# Patient Record
Sex: Female | Born: 1975
Health system: Southern US, Community
[De-identification: ages and names within clinical notes are randomized; demographics above are authoritative.]

## PROBLEM LIST (undated history)

## (undated) DIAGNOSIS — Z992 Dependence on renal dialysis: Secondary | ICD-10-CM

## (undated) DIAGNOSIS — N289 Disorder of kidney and ureter, unspecified: Secondary | ICD-10-CM

## (undated) DIAGNOSIS — Z9119 Patient's noncompliance with other medical treatment and regimen: Secondary | ICD-10-CM

## (undated) DIAGNOSIS — B20 Human immunodeficiency virus [HIV] disease: Secondary | ICD-10-CM

## (undated) DIAGNOSIS — Z22322 Carrier or suspected carrier of Methicillin resistant Staphylococcus aureus: Secondary | ICD-10-CM

## (undated) DIAGNOSIS — B009 Herpesviral infection, unspecified: Secondary | ICD-10-CM

## (undated) DIAGNOSIS — Z21 Asymptomatic human immunodeficiency virus [HIV] infection status: Secondary | ICD-10-CM

## (undated) DIAGNOSIS — J850 Gangrene and necrosis of lung: Secondary | ICD-10-CM

## (undated) DIAGNOSIS — J189 Pneumonia, unspecified organism: Secondary | ICD-10-CM

## (undated) DIAGNOSIS — Z91199 Patient's noncompliance with other medical treatment and regimen due to unspecified reason: Secondary | ICD-10-CM

## (undated) DIAGNOSIS — N186 End stage renal disease: Secondary | ICD-10-CM

---

## 1997-06-24 ENCOUNTER — Other Ambulatory Visit: Admission: RE | Admit: 1997-06-24 | Discharge: 1997-06-24 | Payer: Self-pay | Admitting: Obstetrics

## 2010-03-31 HISTORY — PX: BREAST SURGERY: SHX581

## 2010-04-01 ENCOUNTER — Inpatient Hospital Stay (HOSPITAL_COMMUNITY)
Admission: EM | Admit: 2010-04-01 | Discharge: 2010-04-08 | DRG: 974 | Disposition: A | Discharge status: Home Health | Payer: Medicaid Other | Attending: Internal Medicine | Admitting: Internal Medicine

## 2010-04-01 ENCOUNTER — Inpatient Hospital Stay (INDEPENDENT_AMBULATORY_CARE_PROVIDER_SITE_OTHER)
Admission: RE | Admit: 2010-04-01 | Discharge: 2010-04-01 | Disposition: A | Discharge status: Home / Self Care | Payer: Self-pay | Source: Ambulatory Visit | Attending: Family Medicine | Admitting: Family Medicine

## 2010-04-01 ENCOUNTER — Encounter (INDEPENDENT_AMBULATORY_CARE_PROVIDER_SITE_OTHER): Payer: Self-pay | Admitting: *Deleted

## 2010-04-01 DIAGNOSIS — E43 Unspecified severe protein-calorie malnutrition: Secondary | ICD-10-CM | POA: Diagnosis present

## 2010-04-01 DIAGNOSIS — L03818 Cellulitis of other sites: Secondary | ICD-10-CM | POA: Insufficient documentation

## 2010-04-01 DIAGNOSIS — A4902 Methicillin resistant Staphylococcus aureus infection, unspecified site: Secondary | ICD-10-CM | POA: Diagnosis present

## 2010-04-01 DIAGNOSIS — T8140XA Infection following a procedure, unspecified, initial encounter: Secondary | ICD-10-CM | POA: Diagnosis present

## 2010-04-01 DIAGNOSIS — J189 Pneumonia, unspecified organism: Secondary | ICD-10-CM | POA: Diagnosis present

## 2010-04-01 DIAGNOSIS — N61 Mastitis without abscess: Secondary | ICD-10-CM

## 2010-04-01 DIAGNOSIS — B20 Human immunodeficiency virus [HIV] disease: Principal | ICD-10-CM | POA: Diagnosis present

## 2010-04-01 DIAGNOSIS — A310 Pulmonary mycobacterial infection: Secondary | ICD-10-CM | POA: Diagnosis present

## 2010-04-01 DIAGNOSIS — Y838 Other surgical procedures as the cause of abnormal reaction of the patient, or of later complication, without mention of misadventure at the time of the procedure: Secondary | ICD-10-CM | POA: Diagnosis present

## 2010-04-01 DIAGNOSIS — F3289 Other specified depressive episodes: Secondary | ICD-10-CM | POA: Diagnosis present

## 2010-04-01 DIAGNOSIS — F329 Major depressive disorder, single episode, unspecified: Secondary | ICD-10-CM | POA: Diagnosis present

## 2010-04-01 DIAGNOSIS — R05 Cough: Secondary | ICD-10-CM

## 2010-04-01 DIAGNOSIS — E876 Hypokalemia: Secondary | ICD-10-CM | POA: Diagnosis not present

## 2010-04-01 DIAGNOSIS — L02818 Cutaneous abscess of other sites: Secondary | ICD-10-CM

## 2010-04-01 LAB — COMPREHENSIVE METABOLIC PANEL
Alkaline Phosphatase: 124 U/L — ABNORMAL HIGH (ref 39–117)
BUN: 5 mg/dL — ABNORMAL LOW (ref 6–23)
Calcium: 8.4 mg/dL (ref 8.4–10.5)
GFR calc non Af Amer: >60 mL/min (ref >60)
Glucose, Bld: 91 mg/dL (ref 70–99)
Total Protein: 8.6 g/dL — ABNORMAL HIGH (ref 6.0–8.3)

## 2010-04-01 LAB — DIFFERENTIAL
Eosinophils Relative: 5 % (ref 0–5)
Lymphocytes Relative: 12 % (ref 12–46)
Lymphs Abs: 1.3 10*3/uL (ref 0.7–4.0)
Monocytes Absolute: 1.1 10*3/uL — ABNORMAL HIGH (ref 0.1–1.0)
Monocytes Relative: 10 % (ref 3–12)

## 2010-04-01 LAB — CONVERTED CEMR LAB
CD4 T Helper %: 2 %
Creatinine, Ser: 0.57 mg/dL
Platelets: 417 10*3/uL

## 2010-04-01 LAB — CBC
HCT: 26.4 % — ABNORMAL LOW (ref 36.0–46.0)
MCHC: 31.1 g/dL (ref 30.0–36.0)
MCV: 73.5 fL — ABNORMAL LOW (ref 78.0–100.0)
RDW: 21 % — ABNORMAL HIGH (ref 11.5–15.5)

## 2010-04-02 ENCOUNTER — Inpatient Hospital Stay (HOSPITAL_COMMUNITY): Payer: Medicaid Other

## 2010-04-02 LAB — CBC
MCV: 74.5 fL — ABNORMAL LOW (ref 78.0–100.0)
Platelets: 417 10*3/uL — ABNORMAL HIGH (ref 150–400)
RBC: 3.06 MIL/uL — ABNORMAL LOW (ref 3.87–5.11)
WBC: 11.1 10*3/uL — ABNORMAL HIGH (ref 4.0–10.5)

## 2010-04-02 LAB — BASIC METABOLIC PANEL
BUN: 5 mg/dL — ABNORMAL LOW (ref 6–23)
Chloride: 105 mEq/L (ref 96–112)
GFR calc Af Amer: >60 mL/min (ref >60)
GFR calc non Af Amer: >60 mL/min (ref >60)
Potassium: 3.5 mEq/L (ref 3.5–5.1)
Sodium: 140 mEq/L (ref 135–145)

## 2010-04-02 LAB — DIFFERENTIAL
Basophils Absolute: 0 10*3/uL (ref 0.0–0.1)
Eosinophils Relative: 4 % (ref 0–5)
Monocytes Absolute: 1 10*3/uL (ref 0.1–1.0)
Neutrophils Relative %: 77 % (ref 43–77)

## 2010-04-02 MED ORDER — IOHEXOL 300 MG/ML  SOLN: 75.0000 mL | Freq: Once | INTRAMUSCULAR | Status: AC | PRN

## 2010-04-03 DIAGNOSIS — B59 Pneumocystosis: Secondary | ICD-10-CM

## 2010-04-03 DIAGNOSIS — N61 Mastitis without abscess: Secondary | ICD-10-CM

## 2010-04-04 ENCOUNTER — Other Ambulatory Visit: Payer: Self-pay | Admitting: Surgery

## 2010-04-04 DIAGNOSIS — N61 Mastitis without abscess: Secondary | ICD-10-CM

## 2010-04-04 DIAGNOSIS — B59 Pneumocystosis: Secondary | ICD-10-CM

## 2010-04-04 DIAGNOSIS — N611 Abscess of the breast and nipple: Secondary | ICD-10-CM | POA: Insufficient documentation

## 2010-04-04 LAB — WOUND CULTURE: Gram Stain: NONE SEEN

## 2010-04-05 ENCOUNTER — Inpatient Hospital Stay (HOSPITAL_COMMUNITY): Payer: Medicaid Other

## 2010-04-05 DIAGNOSIS — B59 Pneumocystosis: Secondary | ICD-10-CM

## 2010-04-05 DIAGNOSIS — N61 Mastitis without abscess: Secondary | ICD-10-CM

## 2010-04-05 LAB — BASIC METABOLIC PANEL
BUN: 2 mg/dL — ABNORMAL LOW (ref 6–23)
Chloride: 99 mEq/L (ref 96–112)
GFR calc non Af Amer: >60 mL/min (ref >60)
Glucose, Bld: 117 mg/dL — ABNORMAL HIGH (ref 70–99)
Potassium: 3.2 mEq/L — ABNORMAL LOW (ref 3.5–5.1)
Sodium: 139 mEq/L (ref 135–145)

## 2010-04-05 LAB — EXPECTORATED SPUTUM ASSESSMENT W GRAM STAIN, RFLX TO RESP C

## 2010-04-06 LAB — CBC
HCT: 23.3 % — ABNORMAL LOW (ref 36.0–46.0)
MCHC: 30.5 g/dL (ref 30.0–36.0)
RDW: 21.1 % — ABNORMAL HIGH (ref 11.5–15.5)
WBC: 6.2 10*3/uL (ref 4.0–10.5)

## 2010-04-06 LAB — BASIC METABOLIC PANEL
Calcium: 7.7 mg/dL — ABNORMAL LOW (ref 8.4–10.5)
GFR calc Af Amer: >60 mL/min (ref >60)
GFR calc non Af Amer: >60 mL/min (ref >60)
Glucose, Bld: 118 mg/dL — ABNORMAL HIGH (ref 70–99)
Potassium: 3.1 mEq/L — ABNORMAL LOW (ref 3.5–5.1)
Sodium: 139 mEq/L (ref 135–145)

## 2010-04-07 DIAGNOSIS — I339 Acute and subacute endocarditis, unspecified: Secondary | ICD-10-CM

## 2010-04-07 LAB — CULTURE, ROUTINE-ABSCESS

## 2010-04-07 NOTE — H&P (Signed)
[name], Becker NO.:  [id number]  MEDICAL RECORD NO.:  [id number]           PATIENT TYPE:  I  LOCATION:  5148                         FACILITY:  [facility]  PHYSICIAN:  Cherylynn Ridges, M.D.    DATE OF BIRTH:  17-Dec-1975  DATE OF ADMISSION:  04/01/2010 DATE OF DISCHARGE:  04/01/2010                             HISTORY & PHYSICAL   IDENTIFICATION CHIEF COMPLAINT:  The patient is a 35 year old with an infected right breast biopsy site.  HISTORY OF PRESENT ILLNESS:  The patient is HIV positive and contracted disease from her son's father probably more than 13 years ago.  She by her report had a right breast biopsy performed several days or weeks ago.  This was done in [place].  She subsequently moved to [facility] to be around her father and other family members and 2 days ago noticed some drainage from her incision site on the right side.  It was not packed with anything.  She came into the emergency room with a painful draining right breast incision.  Past medical history is significant for HIV positive.  She is not on any antiretroviral medications.  The patient had a C-section 13 years ago.  FAMILY HISTORY:  Mom died in Jun 21, 2000 of some type of cardiac condition leading to what she says a hole in her heart.  She is allergic to VANCOMYCIN noted in the ER when she developed a rash after a dose vancomycin for the current process.  She is not on any medications.  REVIEW OF SYSTEMS:  She has had some fevers at home up to about 101.  No chills.  PHYSICAL EXAMINATION:  GENERAL:  Here, she is 98.8, pulse of 96, blood pressure 105/64, respirations 20. GENERAL:  She is cachectic.  She has got marks around her skin from multiple recurrent infections.  She has not been treated for HIV for more than a year. HEENT:  Mucous membranes moist and pink. LUNGS:  Clear, although she has a chronic cough which she has had for several months.  Chest x-ray is pending. BREAST:   She has got a very tender, firm right breast with an inferolateral incision draining mucopurulent debris.  The nipple is inverted also with drainage of pus from the nipple.  I was able to open this cavity with Q-tips and subsequently packed with about 2 feet of quater-inch iodoform gauze. ABDOMEN:  Soft and nontender.  Rest of her exam is unremarkable.  LABORATORY STUDIES:  She has a white count of 10.7, hemoglobin of 8.2, platelets 453,000.  Electrolytes were within normal limits.  She is due to get a chest x-ray and also CT scan of her chest to look at her breast abscess and also her pulmonary pathology.  I will admit her after I packed in the ED.  I will start her on Unasyn since she appears to have a reaction to vancomycin.  She will be admitted to Surgical Service but Medicine will continue to follow her because of her HIV status, possibility of other medical problems, possibility of pneumonia and anemia.     Cherylynn Ridges, M.D.  JOW/MEDQ  D:  04/01/2010  T:  04/02/2010  Job:  [id number]  Electronically Signed by Jimmye Norman M.D. on 04/06/2010 02:32:41 PM

## 2010-04-08 ENCOUNTER — Encounter (INDEPENDENT_AMBULATORY_CARE_PROVIDER_SITE_OTHER): Payer: Self-pay | Admitting: *Deleted

## 2010-04-08 DIAGNOSIS — B20 Human immunodeficiency virus [HIV] disease: Secondary | ICD-10-CM | POA: Insufficient documentation

## 2010-04-08 LAB — CULTURE, BLOOD (ROUTINE X 2)
Culture  Setup Time: 201203030217
Culture: NO GROWTH

## 2010-04-08 LAB — CULTURE, RESPIRATORY W GRAM STAIN

## 2010-04-08 NOTE — Op Note (Signed)
NAMEGEORGA, Paula Becker NO.:  [id number]  MEDICAL RECORD NO.:  [id number]           PATIENT TYPE:  LOCATION:                                 FACILITY:  PHYSICIAN:  Currie Paris, M.D.DATE OF BIRTH:  22-Oct-1975  DATE OF PROCEDURE:  04/04/2010 DATE OF DISCHARGE:                              OPERATIVE REPORT   PREOPERATIVE DIAGNOSIS:  Complex breast abscess.  POSTOPERATIVE DIAGNOSIS:  Complex breast abscess.  Multiloculated complex breast abscess.  OPERATION:  Excision and debridement of complex breast abscess with biopsy of breast abscess wall and breast.  SURGEON:  Currie Paris, MD  ANESTHESIA:  General endotracheal.  CLINICAL HISTORY:  This is a 35 year old lady who is HIV positive and who presented with a breast abscess which was partially drained under local anesthesia in the emergency room by Dr. Jimmye Norman about 2 days ago.  Another area opened up for spontaneous drainage.  It looks like an old I and D site at the 6 o'clock position of her areolar margin.  Dr. Dixon Boos opening was at about the 8 o'clock position.  We did not think we could get this adequately debrided.  It drained as she had much induration and edema and exquisitely tender, so I thought this would be best done under anesthesia in the operating room.  DESCRIPTION OF PROCEDURE:  The patient was seen in the holding area and she had no further questions.  I initialed the right breast as the operative site.  The patient was taken to the operating room, and after satisfactory general endotracheal anesthesia had been obtained, the breast was prepped and draped and the time-out was done.  I made a curvilinear incision at the areolar margin connecting the open draining site at the 6 o'clock position and the prior I and D site at the 8 o'clock position, and there was a fairly complex abscess cavity. Further exploration showed that the cavity went more medially, so I opened the  skin further medially to be sure that was all unroofed and part of this very superficial just under the nipple-areolar complex. Other loculations went directly posteriorly, some laterally, some medially and inferiorly.  I spent several minutes opening up of these digitally.  I also took two large pieces of breast tissue that looked like chronic inflammatory tissues, some of which looked like dilated ducts to send for pathology just to be sure this was not an occult cancer and to see if there was any occult other organisms noted on stain of the tissue.  Once I had everything debrided out and I saw no more frank pus, I went ahead and irrigated for several minutes.  There were couple of smaller cavities that were fairly deep.  I put some Surgicel in to make sure there were not going to well up with any blood, although they appeared dry at this point and then packed the wound with 4 x 4's that were moist.  Sterile dressings were applied on the outside.  My plan will be to bring her to the operating room in about 48 hours for a dressing change under anesthesia.  Currie Paris, M.D.     CJS/MEDQ  D:  04/04/2010  T:  04/05/2010  Job:  [id number]  Electronically Signed by Cyndia Bent M.D. on 04/08/2010 05:54:49 AM

## 2010-04-08 NOTE — Op Note (Signed)
  NAMETIFFINIE, CAILLIER NO.:  [id number]  MEDICAL RECORD NO.:  [id number]           PATIENT TYPE:  I  LOCATION:  5114                         FACILITY:  [facility]  PHYSICIAN:  Currie Paris, M.D.DATE OF BIRTH:  1975-02-13  DATE OF PROCEDURE:  04/06/2010 DATE OF DISCHARGE:                              OPERATIVE REPORT   PREOPERATIVE DIAGNOSIS:  Large breast abscess, status post drainage and debridement.  POSTOPERATIVE DIAGNOSIS:  Large breast abscess, status post drainage and debridement.  PROCEDURE:  Dressing change under anesthesia.  CLINICAL HISTORY:  This patient underwent a drainage of an extensive complex multiloculated breast abscess 2 days ago.  She was brought back as scheduled for a dressing change under anesthesia today.  DESCRIPTION OF PROCEDURE:  I saw the patient in the holding area and marked the right breast as the operative site.  She had no further questions.  The patient was taken to the operating room and after satisfactory general (LMA) anesthesia had been obtained, the breast was prepped and draped.  The prior packing was removed.  I saw tiny bit of pus draining from one spot on the right lateral posterior aspect of the large cavity and put a hemostat in that.  I found a very tiny perhaps less 1 cm pocket.  All the rest of pockets that we had drained.  All well drained and actually started to have a clean granulation tissue on them.  There was no other collections of pus and no evidence of any necrotic tissue.  The area was irrigated thoroughly and repacked with sterile gauze saline packs.  The patient tolerated the procedure well with no complications.  Counts were correct.  The plan will be to reevaluate her tomorrow, and if we could do dressing changes in the room under IV sedation or premedication, we will do so, otherwise, bring her back to the operating room on Friday for dressing change under  anesthesia.     Currie Paris, M.D.     CJS/MEDQ  D:  04/06/2010  T:  04/07/2010  Job:  [id number]  Electronically Signed by Cyndia Bent M.D. on 04/08/2010 05:54:28 AM

## 2010-04-09 LAB — ANAEROBIC CULTURE: Gram Stain: NONE SEEN

## 2010-04-12 ENCOUNTER — Ambulatory Visit: Payer: Self-pay | Admitting: Adult Health

## 2010-04-12 LAB — CULTURE, BLOOD (ROUTINE X 2)

## 2010-04-12 NOTE — Miscellaneous (Signed)
Summary: RW updated  Clinical Lists Changes  Observations: Added new observation of INFECTDIS MD: [place] (04/08/2010 10:42) Added new observation of DATE1STVISIT: 04/12/2010 (04/08/2010 10:42) Added new observation of HIV INIT DX: 2002  (04/08/2010 10:42) Added new observation of GENDER: Female  (04/08/2010 10:42) Added new observation of MARITAL STAT: Single  (04/08/2010 10:42) Added new observation of [place]: [place]  (04/08/2010 10:42) Added new observation of HIV COMMENT: originally diagnosed in 2002  (04/08/2010 10:42) Added new observation of YEARAIDSPOS: 2012  (04/08/2010 10:42) Added new observation of HIV STATUS: CDC-defined AIDS  (04/08/2010 10:42)

## 2010-04-12 NOTE — Miscellaneous (Signed)
Summary: Problems, allergies and lab values updated  Clinical Lists Changes  Problems: Added new problem of AIDS (ICD-042) Added new problem of CELLULITIS AND ABSCESS OF OTHER SPECIFIED SITE (ICD-682.8) - right breast Allergies: Added new allergy or adverse reaction of VANCOMYCIN Observations: Added new observation of NKA: F (04/08/2010 10:37) Added new observation of CREATININE: 0.57 mg/dL (42/59/5638 75:64) Added new observation of PLATELETK/UL: 417 K/uL (04/01/2010 10:41) Added new observation of WBC COUNT: 11.1 10*3/microliter (04/01/2010 10:41) Added new observation of HGB: 7.1 g/dL (33/29/5188 41:66) Added new observation of GLUCOSE SER: 118 mg/dL (07/30/1599 09:32) Added new observation of T-HELPER %: 2 % (04/01/2010 10:41) Added new observation of CD4 COUNT: 30 microliters (04/01/2010 10:41)      -  Date:  04/01/2010    CD4: 30    CD4%: 2    Glucose: 118    Hemoglobin: 7.1    WBC: 11.1    Platelets: 417    Creatinine: 0.57

## 2010-04-13 ENCOUNTER — Telehealth: Payer: Self-pay | Admitting: *Deleted

## 2010-04-13 NOTE — Consult Note (Signed)
NAMEKAITELYN, Paula Becker NO.:  [id number]  MEDICAL RECORD NO.:  [id number]           PATIENT TYPE:  E  LOCATION:  [facility]                         FACILITY:  [facility]  PHYSICIAN:  Thad Ranger, MD       DATE OF BIRTH:  10/29/1975  DATE OF CONSULTATION:  04/01/2010 DATE OF DISCHARGE:                                CONSULTATION   REQUESTING PHYSICIAN:  [facility] ER MD  HISTORY OF PRESENT ILLNESS:  Briefly, Paula Becker is a 35 year old female with history of HIV, but not on any antiretroviral therapy and no other medical problems, presented to the [facility] emergency room for the draining large right breast abscess.  The rest of history was obtained from the patient who stated that she relocated to [place], [place] from [place] last week.  She started having draining from the right breast with accumulating pus and abscess and cellulitis in the last 2 months.  The patient was admitted on March 04, 2010, in [facility] [place] [facility], underwent incision and drainage surgery and remained on IV antibiotics, she was discharged on March 10, 2010.  The patient stated that she was not on any antibiotics on the discharge, the Y question compliance.  Here in [place] in the last week the patient started accumulating pus again in the right breast with subjective fever and chills, that she had a fever of 100.8 today at home and pain 9/10 intensity, severe, constant in the right breast which prompted her to come to the [facility] emergency room.  She also had yellowish drainage coming from the right breast.  Otherwise, the patient denies any other medical complaints like nausea, vomiting, abdominal pain, any diarrhea or constipation, any hematochezia or melena.  PAST MEDICAL HISTORY:  HIV diagnosed in 2002, not on any HAART medications.  The patient stated that she could not afford them.  PAST SURGICAL HISTORY:  C-section.  SOCIAL HISTORY:  Denies any smoking,  alcohol or any drug use.  ALLERGIES:  To VANCOMYCIN, the patient was noticed to have a rash in emergency room.  MEDICATIONS:  The patient states that she is not on any medications.  PHYSICAL EXAMINATION:  VITAL SIGNS:  Temperature 99.8,  respiratory rate 16, pulse 103, blood pressure 102/64, initially was 95/60. GENERAL:  The patient is alert, awake and oriented x3, not in any acute distress. HEENT:  Anicteric sclerae.  Conjunctivae clear.  Pupils are reactive to light and accommodation.  EOMI. NECK:  Supple.  No lymphopathy.  No JVD. CVS:  S1-S2 clear. CHEST:  Posteriorly, clear to auscultation bilaterally.  Anteriorly, the patient has a large right breast abscess with active yellowish drainage. No blood. Tender and hard to touch. ABDOMEN:  Normal bowel sounds, soft, nontender. EXTREMITIES:  No cyanosis, clubbing or edema noted in upper or lower extremities.  LABORATORY DATA:  Sodium 137, potassium 3.4, chloride 99, bicarb 28, BUN 5, creatinine 0.6.  White count 10.7, hemoglobin 8.2, hematocrit 26.4.  RECOMMENDATIONS:  Paula Becker is a 35 year old female presenting with a large right breast abscess. 1. Large right breast abscess.  Currently, the patient has active  drainage and is surgical incision and drainage with IV antibiotics.     I would recommend also obtaining a CT chest with contrast to assess     the size of the abscess and since the patient has a reaction to     vancomycin she can be placed on Unasyn 3 g IV q.6 h for now or we     will defer to the Surgery for antibiotic of their choice.  Keep the     patient NPO for a possible OR tonight.  Per my request, ER MD has     requested General Surgery. 2. HIV, not on HAART antiretrovirals.  Patient has no medical problems     and needs surgical admission.  I discussed in detail with the ED     MD.     Thad Ranger, MD     RR/MEDQ  D:  04/01/2010  T:  04/01/2010  Job:  [id number]  Electronically Signed by Iyonnah Ferrante  on 04/13/2010 07:54:09 AM

## 2010-04-14 ENCOUNTER — Ambulatory Visit: Payer: Self-pay | Admitting: Adult Health

## 2010-04-15 ENCOUNTER — Other Ambulatory Visit: Payer: Self-pay | Admitting: Internal Medicine

## 2010-04-15 ENCOUNTER — Other Ambulatory Visit (INDEPENDENT_AMBULATORY_CARE_PROVIDER_SITE_OTHER): Payer: Self-pay

## 2010-04-15 DIAGNOSIS — B20 Human immunodeficiency virus [HIV] disease: Secondary | ICD-10-CM

## 2010-04-15 LAB — CBC WITH DIFFERENTIAL/PLATELET
Basophils Relative: 0 % (ref 0–1)
Eosinophils Absolute: 0.5 10*3/uL (ref 0.0–0.7)
HCT: 28.9 % — ABNORMAL LOW (ref 36.0–46.0)
Hemoglobin: 8.4 g/dL — ABNORMAL LOW (ref 12.0–15.0)
MCH: 22.6 pg — ABNORMAL LOW (ref 26.0–34.0)
MCHC: 29.1 g/dL — ABNORMAL LOW (ref 30.0–36.0)
MCV: 77.9 fL — ABNORMAL LOW (ref 78.0–100.0)
Monocytes Absolute: 0.7 10*3/uL (ref 0.1–1.0)
Monocytes Relative: 12 % (ref 3–12)

## 2010-04-15 LAB — LIPID PANEL
LDL Cholesterol: 71 mg/dL (ref 0–99)
Triglycerides: 93 mg/dL (ref <150)
VLDL: 19 mg/dL (ref 0–40)

## 2010-04-15 LAB — T-HELPER CELL (CD4) - (RCID CLINIC ONLY): CD4 % Helper T Cell: 4 % — ABNORMAL LOW (ref 33–55)

## 2010-04-15 LAB — RPR

## 2010-04-16 LAB — HEPATITIS B CORE ANTIBODY, TOTAL: Hep B Core Total Ab: NEGATIVE

## 2010-04-16 LAB — COMPLETE METABOLIC PANEL WITH GFR
ALT: 17 U/L (ref 0–35)
Alkaline Phosphatase: 100 U/L (ref 39–117)
Sodium: 135 mEq/L (ref 135–145)
Total Bilirubin: 0.2 mg/dL — ABNORMAL LOW (ref 0.3–1.2)
Total Protein: 8.1 g/dL (ref 6.0–8.3)

## 2010-04-16 LAB — HEPATITIS B SURFACE ANTIBODY,QUALITATIVE: Hep B S Ab: NEGATIVE

## 2010-04-18 ENCOUNTER — Encounter: Payer: Self-pay | Admitting: Adult Health

## 2010-04-18 LAB — CONVERTED CEMR LAB
Bilirubin Urine: NEGATIVE
Ketones, ur: NEGATIVE mg/dL
Specific Gravity, Urine: 1.015 (ref 1.005–1.030)
pH: 7.5 (ref 5.0–8.0)

## 2010-04-19 NOTE — Discharge Summary (Signed)
[name], MCEUEN NO.:  [id number]  MEDICAL RECORD NO.:  [id number]           PATIENT TYPE:  LOCATION:                                 FACILITY:  PHYSICIAN:  Lonia Blood, M.D.       DATE OF BIRTH:  [birth year]  DATE OF ADMISSION:  04/02/2010 DATE OF DISCHARGE:  04/08/2010                              DISCHARGE SUMMARY   PRIMARY CARE PHYSICIAN:  Infectious Disease physician Dr. Cliffton Asters.  DISCHARGE DIAGNOSES: 1. Bilateral pneumonia, improving on treatment for community acquired     pathogens. 2. Methicillin-resistant Staphylococcus aureus, multiloculated, large     right breast abscess, status post incision and drainage, currently     with a wound VAC in place. 3. Acquired immune deficiency syndrome. 4. Allergy to vancomycin, displayed in the emergency room. 5. Candida, colonization of the respiratory tract and mouth. 6. Hypokalemia. 7. Severe protein-calorie malnutrition. 8. Anemia of chronic disease and iron deficiency.  DISCHARGE MEDICATIONS: 1. Zithromax 1200 mg once a week. 2. Fluconazole 100 mg daily. 3. Megace 400 mg daily. 4. Oxycodone 5 mg every 4 hours needed for pain. 5. Septra oral suspension 320 mg every 12 hours for 2 weeks.  CONDITION ON DISCHARGE:  Paula Becker was discharged in good condition. Alert, oriented, in no acute distress.  Afebrile.  Oxygen saturation 97% on room air.  The patient will follow up with Dr. Cliffton Asters in Infectious Disease clinic the week after the discharge.  HISTORY AND PHYSICAL:  Refer to the H and P done by Dr. Jimmye Norman.  PROCEDURES DONE ON THIS ADMISSION: 1. On April 02, 2010, the patient underwent a CT chest with contrast,     which shows breast abscess, bilateral lung opacities with air space     consolidation, right middle lobe, lingular, posterior lung bases. 2. April 02, 2010, incision and drainage of large breast abscess. 3. April 06, 2010, dressing change and anesthesia. 4. April 08, 2010,  placement of a wound VAC drain on the right breast     abscess.  CONSULTATION DURING ADMISSION:  [facility] Infectious Disease physician.  HOSPITAL COURSE:  Paula Becker is a 35 year old woman with AIDS, recently moved from [place] to [place], presented to the emergency room with some coughing and right breast abscess.  She was admitted for workup and observation. 1. Right breast abscess that eventually grew methicillin-resistant     Staphylococcus aureus.  This was incised and drained in the     operating room and the patient had dressing changes during her     hospitalization.  Cultures grew methicillin-resistant     Staphylococcus aureus.  The patient was treated with intravenous     Cubicin throughout this admission and decision was made to switch     her to oral Septra at the time of discharge.  The patient was     fitted with a wound VAC in her right breast wound, which will     increase the chances of the wound healing. 2. Bilateral pneumonia felt to be community-acquired.  The patient     respond intravenous Avelox  with decreased oxygen requirement,     improving her respiratory status.  Sputum cultures were obtained     and the results which are currently pending.  The patient was     switched to oral Septra, which should cover community-acquired     pathogens adequately at the time of discharge.  She will follow up     with Dr. Cliffton Asters in Infectious Disease clinic. 3. Severe protein-calorie malnutrition.  The patient was started on     Megace for appetite stimulation purposes. 4. AIDS.  Paula Becker will follow up in the Infectious Disease clinic     to see if she can take HAART therapy.     Lonia Blood, M.D.     SL/MEDQ  D:  04/10/2010  T:  04/11/2010  Job:  [id number]  cc:   Cliffton Asters, M.D.  Electronically Signed by Lonia Blood M.D. on 04/19/2010 11:48:00 AM

## 2010-04-19 NOTE — Progress Notes (Signed)
Summary: Pt. needs labwork, PAP appt. and see [name] @ visit  Phone Note Other Incoming   Caller: Paula Becker, [facility] RN Reason for Call: Confirm/change Appt, Get patient information Summary of Call: Pt. has a [facility] appt. for 04/14/10 w/ B. Sundra Aland.  Pt. did NOT have all of the initial labs drawn in the hospital prior to d/c.   Will need "new" pt. labs drawn during initial [facility] visit tomorrow.  RN will place orders in EMR and take paperwork to the lab.  [name]  April 13, 2010 3:59 PM Pt. will need to see [name] to apply for [employer], Juanell Fairly, and [employer].  Pt will need to schedule a PAP smear.[name]  April 13, 2010 4:04 PM         Process Orders Check Orders Results:     [facility]: ABN not required for this insurance Order queued for requisitioning for [facility]: April 13, 2010 4:01 PM  Tests Sent for requisitioning (April 13, 2010 4:01 PM):     04/13/2010: [facility] -- T-Comprehensive Metabolic Panel [[id number]] (signed)     04/13/2010: [facility] -- T-CBC w/Diff [[id number]] (signed)     04/13/2010: [facility] -- T-Chlamydia  Probe, urine [id number] (signed)     04/13/2010: [facility] -- T-GC Probe, urine [phone] (signed)     04/13/2010: [facility] -- T-Hepatitis B Surface Antigen [[id number]] (signed)     04/13/2010: [facility] -- T-Hepatitis B Surface Antibody [[id number]] (signed)     04/13/2010: [facility] -- T-Hepatitis B Core Antibody [[id number]] (signed)     04/13/2010: [facility] -- T-Hepatitis C Antibody [[id number]] (signed)     04/13/2010: [facility] -- T-HIV Antibody  (Reflex) [[id number]] (signed)     04/13/2010: [facility] -- T-HIV Ab Confirmatory Test/Western Blot [phone] (signed)     04/13/2010: [facility]  -- T-Lipid Profile [phone] (signed)     04/13/2010: [facility] -- T-RPR (Syphilis) [phone] (signed)     04/13/2010: [facility] -- T-Urinalysis [[id number]] (signed)     04/13/2010: [facility] -- T-HIV1 Quant rflx Ultra or Genotype [phone] (signed)

## 2010-04-20 LAB — HIV 1/2 CONFIRMATION: HIV-2 Ab: NEGATIVE

## 2010-04-22 LAB — HIV-1 GENOTYPR PLUS

## 2010-04-28 ENCOUNTER — Ambulatory Visit (INDEPENDENT_AMBULATORY_CARE_PROVIDER_SITE_OTHER): Payer: Self-pay | Admitting: Adult Health

## 2010-04-28 ENCOUNTER — Encounter: Payer: Self-pay | Admitting: Adult Health

## 2010-04-28 VITALS — BP 107/71 | HR 86 | Temp 98.5°F | Ht 71.0 in | Wt 131.0 lb

## 2010-04-28 DIAGNOSIS — Z21 Asymptomatic human immunodeficiency virus [HIV] infection status: Secondary | ICD-10-CM

## 2010-04-28 DIAGNOSIS — B20 Human immunodeficiency virus [HIV] disease: Secondary | ICD-10-CM

## 2010-04-28 MED ORDER — ABACAVIR SULFATE 300 MG PO TABS: 300.0000 mg | ORAL_TABLET | Freq: Two times a day (BID) | ORAL | Status: DC

## 2010-04-28 MED ORDER — RALTEGRAVIR POTASSIUM 400 MG PO TABS: 400.0000 mg | ORAL_TABLET | Freq: Two times a day (BID) | ORAL | Status: DC

## 2010-04-28 MED ORDER — LAMIVUDINE 300 MG PO TABS: 300.0000 mg | ORAL_TABLET | Freq: Every day | ORAL | Status: DC

## 2010-04-28 NOTE — Progress Notes (Signed)
  Subjective:    Patient ID: Paula Becker, female    DOB: [birth year], 34 y.o.   MRN: [id number]  HPI In for f/u.  Has completed re-application for Medicaid.  Not yet complete with [employer]: has not kept an appointment with Eligibility coordinator.Records from [place], [place] still pending.  Had f/u with surgery for breast abscess.  Wound continues to heal well.  Stikll taking oxycodone for pain.     Review of Systems  Constitutional: Negative.   HENT: Negative.   Eyes: Negative.   Respiratory: Negative.   Cardiovascular: Negative.   Gastrointestinal: Negative.   Genitourinary: Negative.   Musculoskeletal: Negative.   Skin: Negative.   Neurological: Negative.   Hematological: Negative.   Psychiatric/Behavioral: Negative.        Objective:   Physical Exam  Constitutional: She is oriented to person, place, and time. She appears well-developed. No distress.       Thin underweight appearing  HENT:  Head: Normocephalic and atraumatic.  Right Ear: External ear normal.  Left Ear: External ear normal.  Nose: Nose normal.  Mouth/Throat: Oropharynx is clear and moist. No oropharyngeal exudate.  Eyes: Conjunctivae and EOM are normal. Pupils are equal, round, and reactive to light. Right eye exhibits no discharge. Left eye exhibits discharge. No scleral icterus.  Neck: Normal range of motion. Neck supple.  Cardiovascular: Normal rate, regular rhythm, normal heart sounds and intact distal pulses.   Pulmonary/Chest: Effort normal and breath sounds normal.       Dressing on left breast clean without drainage   Abdominal: Soft. Bowel sounds are normal.  Musculoskeletal: Normal range of motion.  Neurological: She is alert and oriented to person, place, and time. Coordination normal.  Skin: Skin is warm and dry. She is not diaphoretic.  Psychiatric: She has a normal mood and affect. Her behavior is normal. Judgment and thought content normal.          Assessment & Plan:  HIV:  Needs f/u with  Eligibility.  Will start her on Ziagen, Epivir, and Isentress with samples today.  She was informed she has precisely 1 month to have these access issues addressed.  Verbally acknowledged.  Medication instructions, drug effects, SE's, ADR's reviewed.  Verbally acknowledged understanding.   Will schedule f/u in 2 weeks to see how she is doing with pill form of the medications.  Breaast Abscess:  Continue to f/u with surgery services.

## 2010-05-02 ENCOUNTER — Telehealth: Payer: Self-pay | Admitting: Licensed Clinical Social Worker

## 2010-05-02 ENCOUNTER — Ambulatory Visit: Payer: Self-pay

## 2010-05-02 NOTE — Telephone Encounter (Signed)
Patient states that she is unable to take her current medication in pill form, and wants to change to liquid for all 3 hiv medications. She states she took it like this before when she was in [place].

## 2010-05-05 LAB — FUNGUS CULTURE W SMEAR

## 2010-05-09 NOTE — Telephone Encounter (Signed)
We will discuss this with her on her follow-up visit.

## 2010-05-12 ENCOUNTER — Ambulatory Visit (INDEPENDENT_AMBULATORY_CARE_PROVIDER_SITE_OTHER): Payer: Self-pay | Admitting: Adult Health

## 2010-05-12 ENCOUNTER — Encounter: Payer: Self-pay | Admitting: Adult Health

## 2010-05-12 VITALS — BP 107/71 | HR 92 | Temp 98.8°F | Ht 71.0 in | Wt 130.0 lb

## 2010-05-12 DIAGNOSIS — B20 Human immunodeficiency virus [HIV] disease: Secondary | ICD-10-CM

## 2010-05-12 NOTE — Progress Notes (Signed)
  Subjective:    Patient ID: Paula Becker, female    DOB: 11-03-1975, 35 y.o.   MRN: [id number]  HPI States is not "used to taking pills" and having a difficult time with pill forms of ARV's.  Denies nausea or vomiting, but is experiencing difficulty swallowing pill version of medications.  Does endorse, however, she is adherent to taking meds.  Wound to left breast is healing but remains open and still has visiting nurse come to home to change dressing.  Claims veery small amount of drainage.  Has f/u with surgeon scheduled for next week.  Is requesting something for cough control as she continue to have residual cough from her pneumonia episode.  Denies SOB, DOE, or fever/chills.    Review of Systems  Constitutional: Negative for fever, chills, diaphoresis, activity change, appetite change, fatigue and unexpected weight change.  HENT: Negative.   Eyes: Negative.   Respiratory: Positive for cough. Negative for shortness of breath, wheezing and stridor.   Cardiovascular: Negative for chest pain, palpitations and leg swelling.  Gastrointestinal: Negative for nausea, vomiting, abdominal pain, diarrhea and constipation.  Genitourinary: Negative.   Musculoskeletal: Negative.   Neurological: Negative.   Hematological: Negative.   Psychiatric/Behavioral: Negative.        Objective:   Physical Exam  Constitutional: She is oriented to person, place, and time. She appears well-developed.       Underweight appearing  HENT:  Head: Normocephalic and atraumatic.  Right Ear: External ear normal.  Left Ear: External ear normal.  Nose: Nose normal.  Mouth/Throat: Oropharynx is clear and moist. No oropharyngeal exudate.  Eyes: Conjunctivae and EOM are normal. Pupils are equal, round, and reactive to light.  Neck: Normal range of motion. Neck supple.  Cardiovascular: Normal rate, regular rhythm, normal heart sounds and intact distal pulses.   Pulmonary/Chest: Effort normal and breath sounds normal.  No respiratory distress. She has no wheezes. She has no rales.       Some ronchi noted that clears with cough.  Cough noticeably moist.   Abdominal: Soft. Bowel sounds are normal.  Musculoskeletal: Normal range of motion.  Neurological: She is alert and oriented to person, place, and time. She exhibits normal muscle tone. Coordination normal.  Skin: Skin is warm and dry.  Psychiatric: She has a normal mood and affect. Her behavior is normal. Judgment and thought content normal.          Assessment & Plan:  HIV:  Intolerant to pill form of medication.  However, as was discussed with her previously, we do not have elixir/suspension forms of medications available to her from our sample supply.  As she remains awaiting her Medicaid approval, we cannot provide for her another readily available source for the liquid forms of the medications.  In addition, as she was informed last week, we are uncertain whether the medications she is taking are effective, and we will need to obtain labs in 2 more weeks to determine what resistance patterns she may have.  She verbally acknowledged this information, agreed to continue the pill version of the medications until we can better assess what therapies will be effective for her.  She will RTC in 2 weeks for labs with a subsequent clinic f/u in 4 weeks.  Left breast abscess:  To f/u with surgery service.  Cough:  Recommend guaifenesin with dextromethorphan (Robitussin DM) q 4-6 h PRN for cough and increase her fluid intake.  Verbally acknowledged.

## 2010-05-18 LAB — ACID-FAST (MYCOBACTERIA) SMEAR AND CULTURE WITH REFLEX TO IDENTIFICATION: Acid Fast Smear: NONE SEEN

## 2010-05-26 ENCOUNTER — Other Ambulatory Visit: Payer: Self-pay

## 2010-05-31 ENCOUNTER — Emergency Department (HOSPITAL_COMMUNITY): Payer: Medicaid Other

## 2010-05-31 ENCOUNTER — Inpatient Hospital Stay (HOSPITAL_COMMUNITY): Payer: Medicaid Other

## 2010-05-31 ENCOUNTER — Encounter: Payer: Self-pay | Admitting: Internal Medicine

## 2010-05-31 ENCOUNTER — Inpatient Hospital Stay (HOSPITAL_COMMUNITY)
Admission: EM | Admit: 2010-05-31 | Discharge: 2010-06-04 | DRG: 974 | Disposition: A | Discharge status: Home / Self Care | Payer: Medicaid Other | Attending: Internal Medicine | Admitting: Internal Medicine

## 2010-05-31 DIAGNOSIS — D638 Anemia in other chronic diseases classified elsewhere: Secondary | ICD-10-CM | POA: Insufficient documentation

## 2010-05-31 DIAGNOSIS — B20 Human immunodeficiency virus [HIV] disease: Principal | ICD-10-CM | POA: Diagnosis present

## 2010-05-31 DIAGNOSIS — E876 Hypokalemia: Secondary | ICD-10-CM | POA: Diagnosis present

## 2010-05-31 DIAGNOSIS — R197 Diarrhea, unspecified: Secondary | ICD-10-CM | POA: Diagnosis present

## 2010-05-31 DIAGNOSIS — I959 Hypotension, unspecified: Secondary | ICD-10-CM | POA: Diagnosis present

## 2010-05-31 DIAGNOSIS — Z8614 Personal history of Methicillin resistant Staphylococcus aureus infection: Secondary | ICD-10-CM

## 2010-05-31 DIAGNOSIS — E43 Unspecified severe protein-calorie malnutrition: Secondary | ICD-10-CM | POA: Insufficient documentation

## 2010-05-31 DIAGNOSIS — F39 Unspecified mood [affective] disorder: Secondary | ICD-10-CM | POA: Diagnosis present

## 2010-05-31 DIAGNOSIS — Z9119 Patient's noncompliance with other medical treatment and regimen: Secondary | ICD-10-CM

## 2010-05-31 DIAGNOSIS — L0390 Cellulitis, unspecified: Secondary | ICD-10-CM | POA: Insufficient documentation

## 2010-05-31 DIAGNOSIS — R109 Unspecified abdominal pain: Secondary | ICD-10-CM | POA: Diagnosis present

## 2010-05-31 DIAGNOSIS — Z681 Body mass index (BMI) 19 or less, adult: Secondary | ICD-10-CM

## 2010-05-31 DIAGNOSIS — J189 Pneumonia, unspecified organism: Secondary | ICD-10-CM | POA: Diagnosis present

## 2010-05-31 DIAGNOSIS — E869 Volume depletion, unspecified: Secondary | ICD-10-CM | POA: Diagnosis present

## 2010-05-31 DIAGNOSIS — Z79899 Other long term (current) drug therapy: Secondary | ICD-10-CM

## 2010-05-31 DIAGNOSIS — Z91199 Patient's noncompliance with other medical treatment and regimen due to unspecified reason: Secondary | ICD-10-CM

## 2010-05-31 LAB — URINALYSIS, ROUTINE W REFLEX MICROSCOPIC
Glucose, UA: NEGATIVE mg/dL
Hgb urine dipstick: NEGATIVE
Specific Gravity, Urine: 1.012 (ref 1.005–1.030)
Urobilinogen, UA: 1 mg/dL (ref 0.0–1.0)
pH: 6 (ref 5.0–8.0)

## 2010-05-31 LAB — DIFFERENTIAL
Basophils Relative: 0 % (ref 0–1)
Eosinophils Absolute: 0.1 10*3/uL (ref 0.0–0.7)
Eosinophils Relative: 2 % (ref 0–5)
Monocytes Relative: 13 % — ABNORMAL HIGH (ref 3–12)
Neutrophils Relative %: 72 % (ref 43–77)

## 2010-05-31 LAB — BASIC METABOLIC PANEL
BUN: 11 mg/dL (ref 6–23)
CO2: 30 mEq/L (ref 19–32)
Calcium: 9 mg/dL (ref 8.4–10.5)
Chloride: 93 mEq/L — ABNORMAL LOW (ref 96–112)
Creatinine, Ser: 0.76 mg/dL (ref 0.4–1.2)
Glucose, Bld: 94 mg/dL (ref 70–99)

## 2010-05-31 LAB — MRSA PCR SCREENING: MRSA by PCR: POSITIVE — AB

## 2010-05-31 LAB — CBC
MCH: 23.1 pg — ABNORMAL LOW (ref 26.0–34.0)
MCHC: 31.5 g/dL (ref 30.0–36.0)
Platelets: 527 10*3/uL — ABNORMAL HIGH (ref 150–400)
RBC: 3.63 MIL/uL — ABNORMAL LOW (ref 3.87–5.11)
RDW: 18 % — ABNORMAL HIGH (ref 11.5–15.5)

## 2010-05-31 LAB — URINE MICROSCOPIC-ADD ON

## 2010-05-31 NOTE — H&P (Signed)
Hospital Admission Note Date: 05/31/2010  Patient name: Paula Becker Medical record number: [id number] Date of birth: 03-Apr-1975 Age: 35 y.o. Gender: female PCP: No primary provider on file.   Medical Service:Internal Medicine   Attending physician:  Dr Mariea Stable     Resident [phone]): Dr Loistine Chance         f                Pager: [phone] Resident (R1): Dr Arvilla Market                           Pager: [phone]  Chief Complaint: SOB and cough  History of Present Illness: This is a 35 year old female with PMH significant for HIV ( CD4 40 03/2010 and Viral load 16500) who presented to the ED with persistent SOB and cough since month. Patient was admitted in  March 2012 for right abscess (MRSA) s/p Excision and debridement and was found to have pneumonia treated with Unasyn. Since then patient noted that she continues to have  SOB and cough, productive with greenish phleme. She further noted that she had fevers and chills but never took her temperature.  She denies any CP. Denies  nausea or vomiting  but mentioned some abdominal discomfort. She further noted that she is having diarrhea since a couple of weeks. Loose in nature, non- bloody, no mucous noted. Usually occurred 3-4 times a day. She reports weight loss and no appetite.  No recent travel, no sick contact, no changes in diet, no use of well water.   Of note: Patient was started on HIV medication, Azithromycin, Diflucan and Bactrim for ppx but per pharmacy never filled her prescriptions.   PAST MEDICAL HISTORY 1. HIV  since 12 years. CD 4 count 40, Viral load 16500 2. Hx of pneumonia 03/2010 3. MRSA multiloculated, large right breast abscess, status post incision and drainage 03/2010 4. Hypokalemia.   5. . Severe protein-calorie malnutrition.   6. Anemia of chronic disease and iron deficiency.      Medication: None.  Medication patient should take.  Current Outpatient Prescriptions  Medication Sig Dispense Refill  . abacavir (ZIAGEN) 300 MG  tablet Take 1 tablet (300 mg total) by mouth 2 (two) times daily.  60 tablet  0  . azithromycin (ZITHROMAX) 600 MG tablet Take 1,200 mg by mouth every 7 (seven) days.        . fluconazole (DIFLUCAN) 100 MG tablet Take 100 mg by mouth daily.        Marland Kitchen lamivudine (EPIVIR) 300 MG tablet Take 1 tablet (300 mg total) by mouth daily.  30 tablet  0  . raltegravir (ISENTRESS) 400 MG tablet Take 1 tablet (400 mg total) by mouth 2 (two) times daily.  60 tablet  0  . sulfamethoxazole-trimethoprim (BACTRIM DS,SEPTRA DS) 800-160 MG per tablet Take 2 tablets by mouth 2 (two) times daily.          Allergies: Vancomycin  History   Social History  . Marital Status: Single    Spouse Name: N/A    Number of Children: N/A  . Years of Education: N/A   Occupational History  . Not on file.   Social History Main Topics  . Smoking status: Never Smoker   . Smokeless tobacco: Never Used  . Alcohol Use: No  . Drug Use: No  . Sexually Active: No   Other Topics Concern  . Not on file   Social History Narrative  .  No narrative on file    Review of Systems:  A comprehensive review of systems was negative except mentioned in the the HPI   Physical Exam: Vital: T 99.9 BP 107/71 HR 126 RR 18  Sats: 96% on RA   Gen: Patient is in mild distress, Eyes: PERRL, EOMI, No signs of anemia or jaundince. ENT: MM dry , OP clear, No erythema, thrush or exudates. Neck: Supple,  Resp:Rhochi throughout. Inspiratory and Expiratory wheezing. Decreased air movement throughout  CVS:  Tachycardic but regula S1S2 . No M/R/G. No pain on palpation.  GI: Abdomen is soft.Tender to palpation throughout. NG, NR, BS+. No organomegaly.       Ext: No pedal edema, cyanosis or clubbing. GU: No CVA tenderness. Skin: Right breast : s/p status post incision and drainage. No drainage noted. Bandage in the place.  Lymph: No palpable lymphadenopathy. MS: Moving all 4 extremities. Neuro: A&O X3, CN II - XII are grossly intact. Motor  strength is 5/5 in the all 4 extremities, Sensations intact to light touch, Gait normal, Cerebellar signs negative. Psych: anxious  Lab results:   WBC                                      8.0               4.0-10.5         K/uL  RBC                                      3.63       l      3.87-5.11        MIL/uL  Hemoglobin (HGB)                         8.4        l      12.0-15.0        g/dL  Hematocrit (HCT)                         26.7       l      36.0-46.0        %  MCV                                      73.6       l      78.0-100.0       fL  MCH -                                    23.1       l      26.0-34.0        pg  MCHC                                     31.5              30.0-36.0        g/dL  RDW  18.0       h      11.5-15.5        %  Platelet Count (PLT)                     527        h      150-400          K/uL  Neutrophils, %                           72                43-77            %  Lymphocytes, %                           13                12-46            %  Monocytes, %                             13         h      3-12             %  Eosinophils, %                           2                 0-5              %  Basophils, %                             0                 0-1              %  Neutrophils, Absolute                    5.8               1.7-7.7          K/uL  Lymphocytes, Absolute                    1.1               0.7-4.0          K/uL  Monocytes, Absolute                      1.0               0.1-1.0          K/uL  Eosinophils, Absolute                    0.1               0.0-0.7          K/uL  Basophils, Absolute                      0.0  0.0-0.1          K/uL  Sodium (NA)                               134        l      135-145          mEq/L  Potassium (K)                            3.4        l      3.5-5.1          mEq/L  Chloride                                 93         l      96-112            mEq/L  CO2                                      30                19-32            mEq/L  Glucose                                  94                70-99            mg/dL  BUN                                      11                6-23             mg/dL  Creatinine                               0.76              0.4-1.2          mg/dL  GFR, Est Non African American            >60               >60              mL/min  GFR, Est African American                >60               >60              mL/min    Oversized comment, see footnote  1  Calcium                                  9.0  8.4-10.5         Mg/dL   Lactic Acid, Venous                      1.6               0.5-2.2          Mmol/L   Imaging results:  CXR IMPRESSION:   The lungs appear hyperinflated consistent with obstructive   pulmonary disease.    Persistent infiltrative densities within right middle lobe,   lingula, and in both lower lobes. Right middle lobe and lingular   atelectasis.    New development of infiltrative density consolidation within left   upper lobe consistent with pneumonia.   Assessment & Plan by Problem:  1. SOB and cough: Likely due to pneumonia. Considering patient has been hospitalized within 90 days this has to be treated as nosocomial associated pneumonia. DD include PCP pneumonia in the setting of CD4 40 and non-compliance of ppx. DD PE highly unlikely with the SOB present since 2 month and with a modified Geneva score of low prob. For PE. Highly unlikely Pneumothorax with no sign on CXR. Again unlikely ACS  Due to patients age, symptoms and sign. - Will admit to tele - Will get Blood cx, sputum culture , PCP DFA - Will start on Linezolid, Cefepim and  Bactrim DS ( for PCP pneumonia) - Albuterol nebs  2. Abdomian pain and  Diarrhea:  Unclear ethiology.  - will check C.diff PCR, stool for ova and parasite, culture, microspora, AFB, Giardia, Cryptosporidia, Isospora.  - Amylase  and Lipase  - FOBT X3 - ABX to evalutat abdominal pain  3. AIDS: CD 4 count 40 and HIV viral load 16500 in March of 2012. - will start ppx include Azithromycin and Diflucan - will hold of Antiviral meds since pt was not taking any on admission. Will discuss with ID for further recommendation.   4. Hypokalemia , chronic. Likely due to malnutrition.   - will repleat - will check Mg   5. DVT ppx: Lovenox  ___________________________________ Almyra Deforest, MD [phone]  __________________________________ Nelda Bucks, MD [phone]   Attending Physician:  I have seen and examined the patient. I reviewed the resident note and agree with the findings and plan of care as documented. My additions and revisions are included.    Signature:___________________________________________

## 2010-06-01 LAB — COMPREHENSIVE METABOLIC PANEL
Albumin: 2 g/dL — ABNORMAL LOW (ref 3.5–5.2)
Alkaline Phosphatase: 124 U/L — ABNORMAL HIGH (ref 39–117)
CO2: 26 mEq/L (ref 19–32)
Calcium: 8.6 mg/dL (ref 8.4–10.5)
Chloride: 102 mEq/L (ref 96–112)
Creatinine, Ser: 0.69 mg/dL (ref 0.4–1.2)
GFR calc Af Amer: >60 mL/min (ref >60)
Total Protein: 7.6 g/dL (ref 6.0–8.3)

## 2010-06-01 LAB — CBC
HCT: 24.3 % — ABNORMAL LOW (ref 36.0–46.0)
Hemoglobin: 7.5 g/dL — ABNORMAL LOW (ref 12.0–15.0)
MCH: 22.7 pg — ABNORMAL LOW (ref 26.0–34.0)
RBC: 3.3 MIL/uL — ABNORMAL LOW (ref 3.87–5.11)

## 2010-06-01 LAB — STREP PNEUMONIAE URINARY ANTIGEN: Strep Pneumo Urinary Antigen: NEGATIVE

## 2010-06-01 LAB — TSH: TSH: 1.419 u[IU]/mL (ref 0.350–4.500)

## 2010-06-02 DIAGNOSIS — R0602 Shortness of breath: Secondary | ICD-10-CM

## 2010-06-02 DIAGNOSIS — F39 Unspecified mood [affective] disorder: Secondary | ICD-10-CM

## 2010-06-02 DIAGNOSIS — J189 Pneumonia, unspecified organism: Secondary | ICD-10-CM

## 2010-06-02 DIAGNOSIS — R197 Diarrhea, unspecified: Secondary | ICD-10-CM

## 2010-06-02 LAB — CBC
HCT: 22.2 % — ABNORMAL LOW (ref 36.0–46.0)
HCT: 23 % — ABNORMAL LOW (ref 36.0–46.0)
Hemoglobin: 6.9 g/dL — CL (ref 12.0–15.0)
Hemoglobin: 7.4 g/dL — ABNORMAL LOW (ref 12.0–15.0)
MCH: 23.5 pg — ABNORMAL LOW (ref 26.0–34.0)
MCHC: 31.1 g/dL (ref 30.0–36.0)
MCHC: 32.2 g/dL (ref 30.0–36.0)
MCV: 73 fL — ABNORMAL LOW (ref 78.0–100.0)
RBC: 3.03 MIL/uL — ABNORMAL LOW (ref 3.87–5.11)
RBC: 3.15 MIL/uL — ABNORMAL LOW (ref 3.87–5.11)
WBC: 6.7 10*3/uL (ref 4.0–10.5)

## 2010-06-02 LAB — FERRITIN: Ferritin: 113 ng/mL (ref 10–291)

## 2010-06-02 LAB — BASIC METABOLIC PANEL
Calcium: 8.3 mg/dL — ABNORMAL LOW (ref 8.4–10.5)
GFR calc Af Amer: >60 mL/min (ref >60)
GFR calc non Af Amer: >60 mL/min (ref >60)
Glucose, Bld: 105 mg/dL — ABNORMAL HIGH (ref 70–99)
Potassium: 3.3 mEq/L — ABNORMAL LOW (ref 3.5–5.1)
Sodium: 139 mEq/L (ref 135–145)

## 2010-06-02 LAB — CK TOTAL AND CKMB (NOT AT ARMC)
CK, MB: 0.8 ng/mL (ref 0.3–4.0)
Relative Index: INVALID (ref 0.0–2.5)
Total CK: 49 U/L (ref 7–177)

## 2010-06-02 LAB — VITAMIN B12: Vitamin B-12: 479 pg/mL (ref 211–911)

## 2010-06-02 LAB — IRON AND TIBC: UIBC: 209 ug/dL

## 2010-06-02 NOTE — Consult Note (Signed)
  Paula Becker, MADONNA NO.:  [id number]  MEDICAL RECORD NO.:  [id number]           PATIENT TYPE:  I  LOCATION:  6710                         FACILITY:  [facility]  PHYSICIAN:  Eulogio Ditch, MD DATE OF BIRTH:  04-29-1975  DATE OF CONSULTATION:  06/02/2010 DATE OF DISCHARGE:                                CONSULTATION   REASON FOR CONSULTATION:  Mood lability.  HISTORY OF PRESENT ILLNESS:  A 35 year old African American female with history of HIV who was admitted on the medical floor as she was having right breast drainage and which was painful.  When I saw the patient, the patient was refusing to take her potassium chloride.  When I asked, she told me she do not want to take the medications.  She also reported to me that she was not taking any medications for HIV before coming to the hospital.  The patient was very irritable during the interview, but she was redirectable, not internally preoccupied.  Denies hearing any voices, is not delusional.  The patient was not forthcoming in providing the history about what is going on in her life.  The patient told me she has an apartment and she is on disability and she is able to pay for the apartment.  The patient denied any past psych hospitalization or on any psych medications.  MENTAL STATUS EXAM:  The patient was not cooperative during the interview, was irritable.  Hygiene and grooming poor.  Mood irritable. Affect, mood congruent.  Thought process was goal directed, but was not  providing any history.  But that does not mean the patient was  hallucinating or delusional, she might be frustrated because of her medical issues.  Thought content not suicidal or homicidal, not delusional. Thought perception, no audiovisual hallucination reported. Not internally preoccupied.  Cognition, alert, awake, oriented x3. Memory immediate recent and remote fair.  Attention and concentration fair.  Abstraction ability is  fair.  Insight and judgment fair to poor as she is refusing on and off the treatment on the medical floor.  DIAGNOSES:  AXIS I:  Mood disorder, not otherwise specified. AXIS II:  Deferred. AXIS III:  See medical notes. AXIS IV:  Chronic medical issues, psychosocial stressors. AXIS V:  50.  RECOMMENDATIONS: 1. I told the clinical social worker to call the family after getting     consent from the patient to get more information on the patient. 2. At this time, the patient does not meet any criteria for inpatient     psychiatry. 3. The patient do not want to be started on any medications for the     mood.  Thanks for involving me in taking care of this patient.     Eulogio Ditch, MD     SA/MEDQ  D:  06/02/2010  T:  06/02/2010  Job:  [id number]  Electronically Signed by Eulogio Ditch  on 06/02/2010 06:41:15 PM

## 2010-06-03 LAB — BASIC METABOLIC PANEL
BUN: <3 mg/dL — ABNORMAL LOW (ref 6–23)
Calcium: 8.3 mg/dL — ABNORMAL LOW (ref 8.4–10.5)
GFR calc non Af Amer: >60 mL/min (ref >60)
Potassium: 3.4 mEq/L — ABNORMAL LOW (ref 3.5–5.1)
Sodium: 139 mEq/L (ref 135–145)

## 2010-06-03 LAB — CBC
MCHC: 31.6 g/dL (ref 30.0–36.0)
MCV: 72.6 fL — ABNORMAL LOW (ref 78.0–100.0)
Platelets: 536 10*3/uL — ABNORMAL HIGH (ref 150–400)
RDW: 18.1 % — ABNORMAL HIGH (ref 11.5–15.5)
WBC: 5.6 10*3/uL (ref 4.0–10.5)

## 2010-06-04 DIAGNOSIS — R197 Diarrhea, unspecified: Secondary | ICD-10-CM

## 2010-06-04 DIAGNOSIS — J189 Pneumonia, unspecified organism: Secondary | ICD-10-CM

## 2010-06-04 DIAGNOSIS — R0602 Shortness of breath: Secondary | ICD-10-CM

## 2010-06-04 DIAGNOSIS — Y95 Nosocomial condition: Secondary | ICD-10-CM | POA: Insufficient documentation

## 2010-06-04 LAB — CROSSMATCH
ABO/RH(D): B POS
Antibody Screen: NEGATIVE
Unit division: 0
Unit division: 0

## 2010-06-06 LAB — CULTURE, BLOOD (ROUTINE X 2)
Culture  Setup Time: 201205011518
Culture: NO GROWTH
Culture: NO GROWTH

## 2010-06-07 NOTE — Group Therapy Note (Signed)
  Paula Becker, LAMONTAGNE NO.:  [id number]  MEDICAL RECORD NO.:  [id number]           PATIENT TYPE:  I  LOCATION:  6710                         FACILITY:  [facility]  PHYSICIAN:  Eulogio Ditch, MD DATE OF BIRTH:  [birth year]                                PROGRESS NOTE   REASON FOR FOLLOWUP: Evaluation for capacity.  SUBJECTIVE/OBJECTIVE HISTORY: A 35 year old [place] American female who I saw yesterday for mood lability.  When I entered the room with the clinical social worker today, the patient was very pleasant on approach, very calm, cooperative.  The patient was smiling appropriately during the interview.  I told her my purpose of visit and she took it very comfortably.  The patient told me that she has never been admitted to any psych hospital, not seen by any psychiatrist, or on any psych medication for the mood or anxiety.  The patient also denied any substance abuse history in the past.  The patient told me that she was in [place] and recently moved to [place] 3 months ago as her father lives here and her 5 year old son lives with the father.  The patient lives by herself with a cousin in an apartment and her father helps with the rent and other stuff.  On asking why she was not taking the HIV medications, she told me she lost her appetite and that is why was feeling bad and stopped taking the medication but now she is going to take this medication and will follow up with a doctor in the outpatient setting.  The patient was unable to tell the name of the doctor.  MENTAL STATUS EXAMINATION: Calm, cooperative during the interview, very pleasant on approach. Thought process; logical and goal directed.  Thought content; not suicidal or homicidal, not delusional.  Thought perception; no audiovisual hallucination reported, not internally preoccupied. Cognition; alert, awake, and oriented x3.  Memory; immediate, recent remote intact.  Fund of  knowledge fair.  Abstraction ability good. Insight and judgment intact.  DIAGNOSES: AXIS I:  Adjustment disorder. AXIS II:  Deferred. AXIS III:  See medical notes. AXIS IV:  Chronic medical issues. AXIS V:  50 to 60.  RECOMMENDATIONS: 1. The patient has capacity at this time to make her own decisions and     the patient want to stay in her own apartment and told me that she     will follow up regularly in the outpatient setting. 2. We will also try to contact the father.  The patient gave the     number and the consent to call the father.  I told the clinical     social worker to call her father and go over the safety concerns.  Thanks for involving me in take care of this patient.     Eulogio Ditch, MD     SA/MEDQ  D:  06/03/2010  T:  06/03/2010  Job:  [id number]  Electronically Signed by Eulogio Ditch  on 06/07/2010 04:53:54 PM

## 2010-06-09 DIAGNOSIS — Z22322 Carrier or suspected carrier of Methicillin resistant Staphylococcus aureus: Secondary | ICD-10-CM

## 2010-06-09 DIAGNOSIS — F39 Unspecified mood [affective] disorder: Secondary | ICD-10-CM

## 2010-06-09 DIAGNOSIS — E43 Unspecified severe protein-calorie malnutrition: Secondary | ICD-10-CM

## 2010-06-09 DIAGNOSIS — Y95 Nosocomial condition: Secondary | ICD-10-CM

## 2010-06-09 DIAGNOSIS — J189 Pneumonia, unspecified organism: Secondary | ICD-10-CM

## 2010-06-09 DIAGNOSIS — E876 Hypokalemia: Secondary | ICD-10-CM

## 2010-06-10 ENCOUNTER — Ambulatory Visit: Payer: Self-pay | Admitting: Adult Health

## 2010-06-10 ENCOUNTER — Ambulatory Visit: Payer: Self-pay

## 2010-06-13 ENCOUNTER — Ambulatory Visit: Payer: Self-pay | Admitting: Adult Health

## 2010-06-16 NOTE — Discharge Summary (Signed)
Paula Becker, Paula Becker NO.:  [id number]  MEDICAL RECORD NO.:  [id number]           PATIENT TYPE:  I  LOCATION:  6710                         FACILITY:  [facility]  PHYSICIAN:  Mariea Stable, MD   DATE OF BIRTH:  [birth year]  DATE OF ADMISSION:  05/31/2010 DATE OF DISCHARGE:  06/04/2010                              DISCHARGE SUMMARY   DISCHARGE DIAGNOSES: 1. Hospital-acquired pneumonia. 2. 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 On no medication due to non-compliance.  3. Hypokalemia, improved. 4. Hypomagnesemia, resolved. 5. Hypophosphatasemia, resolved. 6. Anemia of chronic disease with iron deficiency. 7. History of pneumonia in March 2012. 8. Methicillin-resistant Staphylococcus aureus, multiloculated, large     right breast abscess, status post incision and drainage in March     2012. 9. Severe protein-calorie malnutrition. 10.Mood disorder, unspecified, per Psychiatry.  DISCHARGE MEDICATIONS: 1. Levofloxacin solution 750 mg p.o. daily for 3 days. 2. Bactrim oral solution 40/200 mg per 5 ml (take 20 ml for 160/800mg ) p.o. daily. 3. Fluconazole 100 mg p.o. daily. 4. Azithromycin 1200 mg p.o. weekly. 5. Tylenol 650 mg every 6 hours as needed for pain. 6. Ensure 237 mL p.o. 3 times a day with meals. 7. Multivitamin once tablet p.o. daily. 8. Zofran 4 mg p.o. every 6 hours as needed for nausea. 9. MiraLax 17 g p.o. daily. 10.Protein supplement liquid 30 mL p.o. 3 times a day.  DISPOSITION AND FOLLOWUP:    Should consider home health for social work to evaluate home for safety given concerns mentioned in problem number 7 below.  The patient will follow up with Traci Serm[facility] on Jun 10, 2010, at 2:30 p.m.  During this visit office visit, evaluate if the patient is taking medication as prescribed.  Consider to restart antiviral medications since the patient did not take any recommended medication.  The  patient was not restarted on antiviral during this hospital admission. Furthermore, the BMET should be rechecked since the patient had hypokalemia during this hospital admission.  Psychiatrist and social worker were consulted during this hospital admission to evaluate the patient's home situation and capacity.  Psychiatry noted that she does have full capacity to make her own decision concerning her medical therapy.  Please follow up on social worker's recommendation.  The patient was given a 3 day supply of levofloxacin and Bactrim since the patient noted that she would be not able to pay for her medication until Jun 17, 2010.    PROCEDURES:  Chest x-ray on May 31, 2010, showed lungs hyperinflated consistent with obstructive pulmonary disease.  Persistent infiltrative densities within the right middle lobe, lingual, and in both lower lobes.  Right middle lobe and lingual atelectasis.  New development of infiltrative density consolidation within left upper lobe consistent with pneumonia.  Abdominal x-ray two view on May 31, 2010, showed mild gaseous distention of bowel.  No organomegaly or suspicious calcification.  CONSULTATION:  Psychiatry was consulted to evaluate the patient flat affect and capacity to make decisions.  Furthermore, Child psychotherapist was consulted to evaluate the patient's home situation.  BRIEF ADMISSION  HISTORY AND PHYSICAL:  This is a 35 year old female with past medical history significant for HIV, CD-4 count of 40 and viral load 16,500 in March 2012, who presents to the ED with persistent shortness of breath and cough since 2 months.  The patient was admitted in March 2012 for right breast abscess (MRSA) status post excision and debridement, was found to have pneumonia which was treated with Unasyn. Since then, the patient noted she continued to have shortness of breath and cough productive of greenish phlegm.  She further noted that she had fevers but never took  her temperature.  She denies any chest pain, nausea, vomiting, but mentioned that she has some abdominal discomfort. She further noted that she is having diarrhea since the couple of days. These are loose in nature, nonbloody, no mucus.  Usually occurs 3-4 times a day.  She reports furthermore weight loss and decreased appetite.  No recent travel, no sick contact, no changes in diet, no use of avoidance could be identified.  Of note, the patient was started on HIV medication, azithromycin, Diflucan, and Bactrim on April 28, 2010, but these prescriptions were never filled as per Pharmacy.  PHYSICAL EXAMINATION:  VITAL SIGNS:  Temperature 99.9, blood pressure 107/71, heart rate 126, respiratory rate 18, saturation 96% on room air. GENERAL:  The patient is in mild distress. EYES:  PERRL, EOMI.  No signs of anemia or jaundice. ENT:  Mucous membranes try, oropharynx clear, no erythema, thrush, or exudate. NECK:  Supple. RESPIRATORY:  Rhonchi throughout.  No inspiratory or expiratory wheezing.  Decreased eye movement throughout. CVS:  Tachycardic but regular.  S1 and S2.  No murmurs, rubs, or gallops.  No pain on palpation.  No chest wall tenderness. GI:  Abdomen is soft, tender to palpation throughout.  No guarding.  No rebound tenderness.  Bowel sounds present.  No organomegaly was noted. EXTREMITIES:  No pedal edema, cyanosis, or clubbing. GU:  No CVA tenderness. SKIN:  Status post incision and drainage and no drainage was noted. Bandage in place and clean. LYMPH:  No palpable lymphadenopathy. MUSCULOSKELETAL:  Moving all four extremities. NEUROLOGIC:  Alert and oriented x3.  Cranial nerves II through XII are grossly intact.  Motor strength is 5/5 in all four extremities. Sensation intact to light touch, gait normal, cerebellar signs negative. PSYCHIATRIC:  Flat affect.  LABORATORY DATA:  WBC 8, hemoglobin 8.4, hematocrit 26.7, platelets 127. Sodium 134, potassium 3.4, chloride 93,  CO2 30, glucose 74, BUN 11, creatinine 0.76, calcium 9.  Lactic acid 1.6.  HOSPITAL COURSE: 1. Shortness of breath and cough secondary to hospital-acquired     pneumonia.  The patient was in the hospital within the last 90     days.  Therefore, pneumonia had to be treated as a hospital-     acquired pneumonia.  The patient was started on Levaquin, cefepime,     and linezolid since the patient is allergic to [facility].     Furthermore, the patient was started on Bactrim for possible PCP since the     patient does have increased risk considering  CD-4 count of 40 and medical noncompliance.       On the second day, antibiotics were de- escalated and Levaquin was continued to complete a 7-day course.     Patient was discharged on Levaquin and Bactrim prophylaxis.  2. AIDS.  CD-4 count of 40 and HIV viral load of 16,500 in March 2012.     During this hospital admission, prophylaxis was  restarted, but     antiretroviral therapy was not initiated since the patient never     took the prescribed medication.  She noted that she had     difficulties to afford this medication.  This may be restarted in     the outpatient setting.  The patient was continued on Bactrim for     PCP prophylaxis and azithromycin for MAC prophylaxis and Diflucan. 3. Anemia of chronic disease with iron deficiency.  Anemia panel     showed iron of less than 10, vitamin B12 479, folate above 20,     ferritin 113.  Her baseline hemoglobin is between 7.5 and 8.5. 4. Hypotension, resolved. Multifactorial, during this hospital admission     including pneumonia, malnutrition.  Cortisol level was checked     which was 15.9.  The patient responded well with fluids. 5. Tachycardia, unclear etiology.  TSH was within normal limits,     improved with PNA therapy and fluids. 6. Electrolyte abnormalities including hypokalemia, hypomagnesemia,     and hypophosphatemia likely due to malnutrition.  This should be followed up on a regular  basis. 7. Psychiatry was consulted to evaluate the patient for capacity as she was refusing all oral medications and appeared to have poor insight into her disease and consequences of non-adherence.  During this hospitalization, the patient refused multiple  medication and other medical intervention.  Furthermore, the patient noted that at home she occasionally had difficulties to get up and use the restroom.  She would crawl on the floor due to inability to walk.  Therefore, she would sometimes be  incontinent and trouble cleaning herself.  She was noted to have stage II decubitus ulcers.  Furthermore, she has a  history of noncompliance with medications.    Mood disorder NOS per psychiatry and the patient was felt to have full capacity at the time to make her own decision.  Social Worker was consulted to evaluate the patient's home situation. She decided to go back to her own apartment and noted that she  will follow up on a regular basis in the outpatient setting.  She should have an outpatient social work consult to assess home for safety.  DISCHARGE VITALS:  Temperature 97.3, pulse 76, respiratory rate 20, blood pressure 122/82, saturation 100% on room air.  DISCHARGE LABORATORY DATA:  Blood culture, no growth.    ______________________________ Paula Deforest, MD   ______________________________ Mariea Stable, MD    JI/MEDQ  D:  06/05/2010  T:  06/06/2010  Job:  [id number]  cc:   [facility].  Electronically Signed by Paula Deforest MD on 06/15/2010 11:56:09 AM Electronically Signed by Mariea Stable MD on 06/16/2010 04:34:37 PM

## 2010-06-23 ENCOUNTER — Telehealth: Payer: Self-pay | Admitting: *Deleted

## 2010-06-23 NOTE — Telephone Encounter (Signed)
Pt needs to make and keep PAP smear appt.  She also need to schedule f/u lab work and provider f/u OVs ASAP.  Was started on rxes in April.  Jennet Maduro , RN.

## 2010-07-01 DIAGNOSIS — J850 Gangrene and necrosis of lung: Secondary | ICD-10-CM

## 2010-07-01 HISTORY — DX: Gangrene and necrosis of lung: J85.0

## 2010-07-04 ENCOUNTER — Emergency Department (HOSPITAL_COMMUNITY)
Admission: EM | Admit: 2010-07-04 | Discharge: 2010-07-05 | Disposition: A | Discharge status: Home / Self Care | Payer: Medicaid Other | Source: Home / Self Care | Attending: Emergency Medicine | Admitting: Emergency Medicine

## 2010-07-04 ENCOUNTER — Emergency Department (HOSPITAL_COMMUNITY): Payer: Medicaid Other

## 2010-07-04 ENCOUNTER — Encounter: Payer: Self-pay | Admitting: Internal Medicine

## 2010-07-04 LAB — URINALYSIS, ROUTINE W REFLEX MICROSCOPIC
Glucose, UA: NEGATIVE mg/dL
Nitrite: NEGATIVE
Specific Gravity, Urine: 1.02 (ref 1.005–1.030)
pH: 5 (ref 5.0–8.0)

## 2010-07-04 LAB — DIFFERENTIAL
Basophils Absolute: 0 10*3/uL (ref 0.0–0.1)
Basophils Relative: 0 % (ref 0–1)
Lymphocytes Relative: 13 % (ref 12–46)
Monocytes Absolute: 2 10*3/uL — ABNORMAL HIGH (ref 0.1–1.0)
Neutro Abs: 10 10*3/uL — ABNORMAL HIGH (ref 1.7–7.7)
Neutrophils Relative %: 72 % (ref 43–77)

## 2010-07-04 LAB — LACTATE DEHYDROGENASE: LDH: 365 U/L — ABNORMAL HIGH (ref 94–250)

## 2010-07-04 LAB — COMPREHENSIVE METABOLIC PANEL
Albumin: 2 g/dL — ABNORMAL LOW (ref 3.5–5.2)
Alkaline Phosphatase: 131 U/L — ABNORMAL HIGH (ref 39–117)
BUN: 12 mg/dL (ref 6–23)
Calcium: 8.8 mg/dL (ref 8.4–10.5)
Glucose, Bld: 99 mg/dL (ref 70–99)
Potassium: 4.2 mEq/L (ref 3.5–5.1)
Total Protein: 8.4 g/dL — ABNORMAL HIGH (ref 6.0–8.3)

## 2010-07-04 LAB — CBC
HCT: 22.3 % — ABNORMAL LOW (ref 36.0–46.0)
Hemoglobin: 6.8 g/dL — CL (ref 12.0–15.0)
MCHC: 30.5 g/dL (ref 30.0–36.0)
RBC: 3.13 MIL/uL — ABNORMAL LOW (ref 3.87–5.11)
WBC: 13.9 10*3/uL — ABNORMAL HIGH (ref 4.0–10.5)

## 2010-07-04 LAB — URINE MICROSCOPIC-ADD ON

## 2010-07-04 LAB — LACTIC ACID, PLASMA: Lactic Acid, Venous: 1.4 mmol/L (ref 0.5–2.2)

## 2010-07-04 LAB — POCT PREGNANCY, URINE: Preg Test, Ur: NEGATIVE

## 2010-07-04 NOTE — Progress Notes (Signed)
Hospital Admission Note Date: 07/04/2010  Patient name: Paula Becker Medical record number: [id number] Date of birth: 01/03/1976 Age: 35 y.o. Gender: female PCP: Carolin Guernsey, NP, NP  Medical Service: IMTS  Attending physician:  Dr. Margarito Liner  Pager: Resident [phone]):  Dr. Scot Dock   Pager: [phone] Resident (R1):  Dr. Cathey Endow   Pager: [phone]  Chief Complaint: Persistent cough and generalized weakness  History of Present Illness: This is a 35 year old female with PMH significant for HIV ( CD4 30 03/2010 and Viral load 16500), medication noncompliance and recent admission for HAP in early May 2012 presenting with persistent cough and generalized weakness. Patient unable to give much of a history stating that she was tired and wanted to sleep. However, it appears she has had persistent non productive coughing since her discharge earlier in May, along with feeling generally weak and fatigued over the past 3-4 days. She states that she has been taking her medications but does not know the names of any of her medications. It appears that she hasn't followed up at the ID clinic since her discharge as well. Currently she denies having any fever, chills, cp, sob, diaphoresis, n/v, abdominal pain, or any other systemic symptoms.    Past Medical History:  1. Hospital-acquired pneumonia during May 2012 admission.  2. AIDS since age 22, CD-4 count of 30, viral load of 16,500 (March 2012). On no medication due to non-compliance.    3. Anemia of chronic disease with iron deficiency. Baseline Hb around 7.  4. History of pneumonia in March 2012.   5. Methicillin-resistant Staphylococcus aureus, multiloculated, large       right breast abscess, status post incision and drainage in March       2012. Is to follow with General Surgery.  6. Severe protein-calorie malnutrition.   7. Mood disorder, unspecified, per Psychiatry.  Current Outpatient Prescriptions  Medication Sig Dispense Refill  .  abacavir (ZIAGEN) 300 MG tablet Take 1 tablet (300 mg total) by mouth 2 (two) times daily.  60 tablet  0  . azithromycin (ZITHROMAX) 600 MG tablet Take 1,200 mg by mouth every 7 (seven) days.        . fluconazole (DIFLUCAN) 100 MG tablet Take 100 mg by mouth daily.        Marland Kitchen lamivudine (EPIVIR) 300 MG tablet Take 1 tablet (300 mg total) by mouth daily.  30 tablet  0  . Multiple Vitamin (MULTIVITAMIN) tablet Take 1 tablet by mouth daily.        . Nutritional Supplements (ENSURE PO) Take 237 mLs by mouth 3 (three) times daily with meals.        . Nutritional Supplements (PROTEIN SUPPLEMENT 80% PO) Take 30 mLs by mouth 3 (three) times daily.        . ondansetron (ZOFRAN) 4 MG tablet Take 4 mg by mouth every 6 (six) hours as needed. For nausea        . polyethylene glycol (MIRALAX / GLYCOLAX) packet Take 17 g by mouth daily.        . raltegravir (ISENTRESS) 400 MG tablet Take 1 tablet (400 mg total) by mouth 2 (two) times daily.  60 tablet  0  . sulfamethoxazole-trimethoprim (BACTRIM DS,SEPTRA DS) 800-160 MG per tablet Take 2 tablets by mouth 2 (two) times daily.          Allergies: Shellfish-derived products and Vancomycin   History   Social History  . Marital Status: Single    Spouse Name: N/A  Number of Children: N/A  . Years of Education: N/A   Occupational History  . Not on file.   Social History Main Topics  . Smoking status: Never Smoker   . Smokeless tobacco: Never Used  . Alcohol Use: No  . Drug Use: No  . Sexually Active: No   Other Topics Concern  . Not on file   Social History Narrative  . No narrative on file    Review of Systems: Pertinent items are noted in HPI.  Physical Exam:  Vitals: t 98.6, p 120-140, rr 16-20, bp 101/67, o2 sat 97%RA General appearance: no distress and uncooperative Throat: dry mucosal membranes Lungs: coarse breath sounds diffusely that clears a bit with coughing Breasts: right breast with an inferolateral incision packed with dry  dressing. Currently non-draining, non fluctuant  Heart: tachycardic, S1, S2 normal, no mrg Abdomen: soft, non-tender; bowel sounds normal; no masses,  no organomegaly Extremities: extremities normal, atraumatic, no cyanosis or edema Pulses: 2+ and symmetric Neurologic: Grossly normal Unable to complete full exam due to non cooperation   Lab results:  CBC:    Component Value Date/Time   WBC 13.9* 07/04/2010 2015   HGB 6.8 REPEATED TO VERIFY CRITICAL RESULT CALLED TO, READ BACK BY AND VERIFIED WITH: [facility] RN [phone] DLONG* 07/04/2010 2015   HCT 22.3* 07/04/2010 2015   PLT 650* 07/04/2010 2015   MCV 71.2* 07/04/2010 2015   NEUTROABS 10.0* 07/04/2010 2015   LYMPHSABS 1.7 07/04/2010 2015   MONOABS 2.0* 07/04/2010 2015   EOSABS 0.1 07/04/2010 2015   BASOSABS 0.0 07/04/2010 2015   Comprehensive Metabolic Panel:    Component Value Date/Time   NA 134* 07/04/2010 2015   K 4.2 SLIGHT HEMOLYSIS 07/04/2010 2015   CL 93* 07/04/2010 2015   CO2 28 07/04/2010 2015   BUN 12 07/04/2010 2015   CREATININE 0.97 07/04/2010 2015   CREATININE 0.66 04/15/2010 1206   GLUCOSE 99 07/04/2010 2015   GLUCOSE 118 04/01/2010   CALCIUM 8.8 07/04/2010 2015   AST 27 SLIGHT HEMOLYSIS 07/04/2010 2015   ALT 11 07/04/2010 2015   ALKPHOS 131* 07/04/2010 2015   BILITOT 0.2* 07/04/2010 2015   PROT 8.4* 07/04/2010 2015   ALBUMIN 2.0* 07/04/2010 2015    LDH                                      365        h      94-250           U/L    SLIGHT HEMOLYSIS  Color, Urine                             AMBER      a      YELLOW    BIOCHEMICALS MAY BE AFFECTED BY COLOR  Appearance                               CLOUDY     a      CLEAR  Specific Gravity                         1.020             1.005-1.030  pH  5.0               5.0-8.0  Urine Glucose                            NEGATIVE          NEG              mg/dL  Bilirubin                                SMALL      a      NEG  Ketones                                  TRACE       a      NEG              mg/dL  Blood                                    NEGATIVE          NEG  Protein                                  100        a      NEG              mg/dL  Urobilinogen                             1.0               0.0-1.0          mg/dL  Nitrite                                  NEGATIVE          NEG  Leukocytes                               TRACE      a      NEG  Squamous Epithelial / LPF                MANY       a      RARE  Casts / HPF                              SEE NOTE.  a      NEG    HYALINE CASTS  WBC / HPF                                3-6               <3               WBC/hpf  Bacteria / HPF  RARE              RARE  Imaging results:   CHEST - 2 VIEW    Comparison: Chest x-ray 05/31/2010.    Findings: The cardiac silhouette, mediastinal and hilar contours   are within normal limits and stable.  There is diffuse bronchitic   change with peribronchial thickening and increased interstitial   markings.  Focal airspace consolidation in the left upper lobe and   left lower lobes are suggestive of pneumonia.  No definite pleural   effusions or pneumothorax.  The bony thorax is intact.    IMPRESSION:   Severe bronchitic changes and left upper and lower lobe   infiltrates.  Assessment & Plan by Problem:  1. Persistent Cough - likely 2/2 poorly treated HAP as seen on radiograph. Unclear if patient completed her antibiotic course (was sent home on Levaquin) and she has not followed up with a physician since discharge. Otherwise she is afebrile currently, but tachycardic, has a white count and meets the SIRS criteria.  - admit to tele bed - abx coverage for HAP with Levaquin, Linezolid and Cefepime (allergic to Vanc) - check sputum culture, urine legionella and strep pneumo Ag - O2 sats fine on RA, no concerning sx of PCP PNA currently, cont Bactrim ppx - IVF    2. AIDS - CD 4 count 40 and HIV viral load 16500 in March of 2012.   - will start ppx include Azithromycin and Diflucan  - will hold of Antiviral meds since pt was not taking any on admission. Will discuss with ID for further recommendation.  - check CD4 count and viral load  3. Tachycardia - 2/2 acute infection and likely volume depletion - IVF NS @100cc /hr - f/u 12 lead EKG  4. AOCD - baseline Hb around 7  - anemia panel in May consistent with a mixed AOCD as well as Fe def - start FeSO4 325mg  po TID - monitor for GI side effects  5. Malnutrition - albumin low at 2 - start Ensure - formal nutrition consult - check Mg, Phos  6. H/o MRSA breast abscess   - currently draining purulent material - cont abx as outlined above - wound care consult - may need to touch base with [facility], it is not clear if she has followed with them on outpatient basis  7. DVT ppx - SCDs

## 2010-07-05 ENCOUNTER — Inpatient Hospital Stay (HOSPITAL_COMMUNITY): Payer: Medicaid Other

## 2010-07-05 ENCOUNTER — Inpatient Hospital Stay (HOSPITAL_COMMUNITY)
Admission: AD | Admit: 2010-07-05 | Discharge: 2010-07-11 | DRG: 974 | Disposition: A | Discharge status: Home Health | Payer: Medicaid Other | Source: Other Acute Inpatient Hospital | Attending: Internal Medicine | Admitting: Internal Medicine

## 2010-07-05 DIAGNOSIS — F39 Unspecified mood [affective] disorder: Secondary | ICD-10-CM | POA: Diagnosis present

## 2010-07-05 DIAGNOSIS — E876 Hypokalemia: Secondary | ICD-10-CM | POA: Diagnosis not present

## 2010-07-05 DIAGNOSIS — Z9119 Patient's noncompliance with other medical treatment and regimen: Secondary | ICD-10-CM

## 2010-07-05 DIAGNOSIS — J189 Pneumonia, unspecified organism: Principal | ICD-10-CM | POA: Diagnosis present

## 2010-07-05 DIAGNOSIS — E46 Unspecified protein-calorie malnutrition: Secondary | ICD-10-CM | POA: Diagnosis present

## 2010-07-05 DIAGNOSIS — B20 Human immunodeficiency virus [HIV] disease: Secondary | ICD-10-CM | POA: Diagnosis present

## 2010-07-05 DIAGNOSIS — D649 Anemia, unspecified: Secondary | ICD-10-CM | POA: Diagnosis present

## 2010-07-05 DIAGNOSIS — N61 Mastitis without abscess: Secondary | ICD-10-CM | POA: Diagnosis present

## 2010-07-05 DIAGNOSIS — Z881 Allergy status to other antibiotic agents status: Secondary | ICD-10-CM

## 2010-07-05 DIAGNOSIS — A4902 Methicillin resistant Staphylococcus aureus infection, unspecified site: Secondary | ICD-10-CM | POA: Diagnosis present

## 2010-07-05 DIAGNOSIS — J852 Abscess of lung without pneumonia: Secondary | ICD-10-CM | POA: Diagnosis present

## 2010-07-05 DIAGNOSIS — B009 Herpesviral infection, unspecified: Secondary | ICD-10-CM | POA: Diagnosis present

## 2010-07-05 HISTORY — DX: Asymptomatic human immunodeficiency virus (hiv) infection status: Z21

## 2010-07-05 HISTORY — DX: Carrier or suspected carrier of methicillin resistant Staphylococcus aureus: Z22.322

## 2010-07-05 HISTORY — DX: Human immunodeficiency virus (HIV) disease: B20

## 2010-07-05 LAB — PHOSPHORUS: Phosphorus: 3 mg/dL (ref 2.3–4.6)

## 2010-07-06 ENCOUNTER — Encounter (HOSPITAL_COMMUNITY): Payer: Self-pay | Admitting: Radiology

## 2010-07-06 DIAGNOSIS — J13 Pneumonia due to Streptococcus pneumoniae: Secondary | ICD-10-CM

## 2010-07-06 DIAGNOSIS — J189 Pneumonia, unspecified organism: Secondary | ICD-10-CM

## 2010-07-06 DIAGNOSIS — Z9119 Patient's noncompliance with other medical treatment and regimen: Secondary | ICD-10-CM

## 2010-07-06 DIAGNOSIS — B20 Human immunodeficiency virus [HIV] disease: Secondary | ICD-10-CM

## 2010-07-06 DIAGNOSIS — J852 Abscess of lung without pneumonia: Secondary | ICD-10-CM

## 2010-07-06 DIAGNOSIS — Z881 Allergy status to other antibiotic agents status: Secondary | ICD-10-CM

## 2010-07-06 LAB — BASIC METABOLIC PANEL
CO2: 28 mEq/L (ref 19–32)
Calcium: 7.6 mg/dL — ABNORMAL LOW (ref 8.4–10.5)
Creatinine, Ser: 0.66 mg/dL (ref 0.4–1.2)
GFR calc Af Amer: >60 mL/min (ref >60)
Glucose, Bld: 129 mg/dL — ABNORMAL HIGH (ref 70–99)

## 2010-07-06 LAB — GC/CHLAMYDIA PROBE AMP, URINE
Chlamydia, Swab/Urine, PCR: NEGATIVE
GC Probe Amp, Urine: NEGATIVE

## 2010-07-06 LAB — CBC
Hemoglobin: 7 g/dL — ABNORMAL LOW (ref 12.0–15.0)
MCH: 21.9 pg — ABNORMAL LOW (ref 26.0–34.0)
MCHC: 31.3 g/dL (ref 30.0–36.0)
RDW: 19.3 % — ABNORMAL HIGH (ref 11.5–15.5)

## 2010-07-06 LAB — HIV-1 RNA QUANT-NO REFLEX-BLD
HIV 1 RNA Quant: 12000 copies/mL — ABNORMAL HIGH (ref <20)
HIV-1 RNA Quant, Log: 4.08 {Log} — ABNORMAL HIGH (ref <1.30)

## 2010-07-06 MED ORDER — IOHEXOL 300 MG/ML  SOLN
80.0000 mL | Freq: Once | INTRAMUSCULAR | Status: AC | PRN
  Administered 2010-07-06: 80 mL via INTRAVENOUS

## 2010-07-07 DIAGNOSIS — M79609 Pain in unspecified limb: Secondary | ICD-10-CM

## 2010-07-07 LAB — CROSSMATCH
ABO/RH(D): B POS
Antibody Screen: NEGATIVE
Unit division: 0

## 2010-07-07 LAB — STREP PNEUMONIAE ANTIBODY SEROTYPES
Strep pneumo Type 19: 13.18 ug/mL
Strep pneumo Type 9: 2.08 ug/mL
Strep pneumoniae Type 5 Abs: 3.39 ug/mL
Strep pneumoniae Type 6B Abs: 0.58 ug/mL

## 2010-07-07 LAB — HERPES SIMPLEX VIRUS(HSV) DNA BY PCR

## 2010-07-08 DIAGNOSIS — J189 Pneumonia, unspecified organism: Secondary | ICD-10-CM

## 2010-07-11 ENCOUNTER — Encounter: Payer: Self-pay | Admitting: Adult Health

## 2010-07-11 ENCOUNTER — Inpatient Hospital Stay (HOSPITAL_COMMUNITY): Payer: Medicaid Other

## 2010-07-11 LAB — CULTURE, BLOOD (ROUTINE X 2): Culture: NO GROWTH

## 2010-07-11 LAB — QUANTIFERON TB GOLD ASSAY (BLOOD): Interferon Gamma Release Assay: UNDETERMINED

## 2010-07-19 ENCOUNTER — Ambulatory Visit (INDEPENDENT_AMBULATORY_CARE_PROVIDER_SITE_OTHER): Payer: Medicaid Other | Admitting: Adult Health

## 2010-07-19 ENCOUNTER — Encounter: Payer: Self-pay | Admitting: Adult Health

## 2010-07-19 VITALS — BP 104/71 | HR 101 | Temp 97.4°F | Ht 68.5 in | Wt 121.1 lb

## 2010-07-19 DIAGNOSIS — B20 Human immunodeficiency virus [HIV] disease: Secondary | ICD-10-CM

## 2010-07-19 MED ORDER — ABACAVIR SULFATE 20 MG/ML PO SOLN: 600.0000 mg | Freq: Every day | ORAL | Status: DC

## 2010-07-19 MED ORDER — EMTRICITABINE 10 MG/ML PO SOLN: 240.0000 mg | Freq: Every day | ORAL | Status: DC

## 2010-07-19 MED ORDER — RITONAVIR 80 MG/ML PO SOLN: 100.0000 mg | Freq: Every day | ORAL | Status: DC

## 2010-07-19 NOTE — Progress Notes (Signed)
  Subjective:    Patient ID: Paula Becker, female    DOB: 1975-10-06, 35 y.o.   MRN: [id number]  HPI Presents to clinic today for followup. Recently, discharge from the hospital for a second episode of MRSA pneumonia. Currently feeling better without fevers, cough, or shortness of breath. Left breast abscess improved. Still followed by surgery services for the abscess. Still remains sporadically adherence. Antiretroviral medications and genotype was performed on labs obtained 04/22/2010. States is ready to begin suspension 4 medications.   Review of Systems  Constitutional: Positive for fatigue. Negative for fever, chills and diaphoresis.  HENT: Negative.   Eyes: Negative.   Respiratory: Positive for cough and shortness of breath.   Cardiovascular: Negative.   Gastrointestinal: Negative.   Genitourinary: Negative.   Musculoskeletal: Negative.   Skin: Negative.   Neurological: Negative.   Hematological: Negative.   Psychiatric/Behavioral: Negative.        Objective:   Physical Exam  Constitutional: She is oriented to person, place, and time. She appears well-developed. No distress.       Malnourished appearing  HENT:  Head: Normocephalic and atraumatic.  Eyes: Conjunctivae and EOM are normal. Pupils are equal, round, and reactive to light.  Neck: Normal range of motion. Neck supple.  Cardiovascular: Normal rate and regular rhythm.   Pulmonary/Chest: Effort normal.       Diminished breath sounds in bilateral anterior and posterior bases.  Abdominal: Soft. Bowel sounds are normal.  Musculoskeletal: Normal range of motion.  Neurological: She is alert and oriented to person, place, and time. No cranial nerve deficit. She exhibits normal muscle tone. Coordination normal.  Skin: Skin is warm and dry.  Psychiatric: She has a normal mood and affect. Her behavior is normal. Judgment and thought content normal.          Assessment & Plan:  1. HIV. Resistance assay performed on  viral load obtained 04/22/2010, shows K101E, V179F, Y181C, G190A, more consistent with NNRTI resistance. Given this information, we will discontinue her current regimen of Ziagen, Epivir, and Isentress. We will begin Ziagen solution 30 mL by mouth daily, emtriva solution 24 mL's by mouth daily, and Prezista solution 8 mL. By mouth daily, and Norvir solution 1.3 mL's daily. She is to clinic for follow-up

## 2010-07-30 ENCOUNTER — Inpatient Hospital Stay (HOSPITAL_COMMUNITY)
Admission: EM | Admit: 2010-07-30 | Discharge: 2010-08-06 | DRG: 974 | Disposition: A | Discharge status: Home / Self Care | Payer: Medicaid Other | Attending: Internal Medicine | Admitting: Internal Medicine

## 2010-07-30 ENCOUNTER — Emergency Department (HOSPITAL_COMMUNITY): Payer: Medicaid Other

## 2010-07-30 ENCOUNTER — Encounter: Payer: Self-pay | Admitting: Internal Medicine

## 2010-07-30 DIAGNOSIS — Z66 Do not resuscitate: Secondary | ICD-10-CM | POA: Diagnosis present

## 2010-07-30 DIAGNOSIS — J852 Abscess of lung without pneumonia: Secondary | ICD-10-CM | POA: Diagnosis present

## 2010-07-30 DIAGNOSIS — Z9119 Patient's noncompliance with other medical treatment and regimen: Secondary | ICD-10-CM

## 2010-07-30 DIAGNOSIS — J189 Pneumonia, unspecified organism: Secondary | ICD-10-CM | POA: Diagnosis present

## 2010-07-30 DIAGNOSIS — B20 Human immunodeficiency virus [HIV] disease: Principal | ICD-10-CM | POA: Diagnosis present

## 2010-07-30 DIAGNOSIS — Z91199 Patient's noncompliance with other medical treatment and regimen due to unspecified reason: Secondary | ICD-10-CM

## 2010-07-30 DIAGNOSIS — F39 Unspecified mood [affective] disorder: Secondary | ICD-10-CM | POA: Diagnosis present

## 2010-07-30 DIAGNOSIS — Z515 Encounter for palliative care: Secondary | ICD-10-CM

## 2010-07-30 DIAGNOSIS — E876 Hypokalemia: Secondary | ICD-10-CM | POA: Diagnosis not present

## 2010-07-30 DIAGNOSIS — D638 Anemia in other chronic diseases classified elsewhere: Secondary | ICD-10-CM | POA: Diagnosis present

## 2010-07-30 LAB — DIFFERENTIAL
Eosinophils Absolute: 0 10*3/uL (ref 0.0–0.7)
Lymphocytes Relative: 12 % (ref 12–46)
Lymphs Abs: 1.6 10*3/uL (ref 0.7–4.0)
Neutrophils Relative %: 73 % (ref 43–77)

## 2010-07-30 LAB — CBC
MCV: 72.7 fL — ABNORMAL LOW (ref 78.0–100.0)
Platelets: 411 10*3/uL — ABNORMAL HIGH (ref 150–400)
RBC: 3.96 MIL/uL (ref 3.87–5.11)
WBC: 13.4 10*3/uL — ABNORMAL HIGH (ref 4.0–10.5)

## 2010-07-30 LAB — BASIC METABOLIC PANEL
BUN: 14 mg/dL (ref 6–23)
Chloride: 94 mEq/L — ABNORMAL LOW (ref 96–112)
GFR calc Af Amer: >60 mL/min (ref >60)
Potassium: 4.6 mEq/L (ref 3.5–5.1)

## 2010-07-30 NOTE — H&P (Signed)
Hospital Admission Note Date: 07/30/2010  Patient name: Paula Becker Medical record number: [id number] Date of birth: 1975/09/04 Age: 35 y.o. Gender: female PCP: Carolin Guernsey, NP, NP  Medical Service: Internal Medicine  Attending physician:  Dr. Josem Kaufmann    Resident (R2/R3):  Dr. Baltazar Apo    Pager: [phone] Resident (R1):  Dr. Abner Greenspan   Pager: [phone]  Chief Complaint: Cough  History of Present Illness: Paula Becker is a 35 year old woman with past medical history significant for AIDS (CD 4 30 07/05/2010), Chronic bilateral pneumonia persisting over the last 5 months, and mood disorder NOS who presents to the ER for cough. She states the cough has been persistent since last discharge 07/11/2010. She was admitted for necrotizing pneumonia and was treated with IV abx, which was transitioned to avelox on day of discharge. Patient states she has not taken Avelox or ART due to nausea. Of note, patient has a long-standing history of medication noncompliance. The cough was associated with productive green sputum, with fevers and chills. The patient also notes chest pain when coughing. No nausea, vomiting, constipation, diarrhea, changes in urinary or bowel character. No sick contacts.   Current Outpatient Prescriptions  Medication Sig Dispense Refill  . abacavir (ZIAGEN) 20 MG/ML solution Take 30 mLs (600 mg total) by mouth daily.  240 mL  0  . azithromycin (ZITHROMAX) 600 MG tablet Take 1,200 mg by mouth every 7 (seven) days.        Marland Kitchen emtricitabine (EMTRIVA) 10 MG/ML solution Take 24 mLs (240 mg total) by mouth daily.  170 mL  0  . fluconazole (DIFLUCAN) 100 MG tablet Take 100 mg by mouth daily.        . Multiple Vitamin (MULTIVITAMIN) tablet Take 1 tablet by mouth daily.        . Nutritional Supplements (ENSURE PO) Take 237 mLs by mouth 3 (three) times daily with meals.        . Nutritional Supplements (PROTEIN SUPPLEMENT 80% PO) Take 30 mLs by mouth 3 (three) times daily.        .  ondansetron (ZOFRAN) 4 MG tablet Take 4 mg by mouth every 6 (six) hours as needed. For nausea        . polyethylene glycol (MIRALAX / GLYCOLAX) packet Take 17 g by mouth daily.        . ritonavir (NORVIR) 80 MG/ML solution Take 1.3 mLs (104 mg total) by mouth daily. Take with Prezista solution  40 mL  0  . sulfamethoxazole-trimethoprim (BACTRIM DS,SEPTRA DS) 800-160 MG per tablet Take 2 tablets by mouth 2 (two) times daily.          Allergies: Shellfish-derived products and Vancomycin which cause facial swelling   Past Medical History  Diagnosis Date  . HIV (human immunodeficiency virus infection) AIDS (CD 4 30, VL 1200) with several mutations NRTI resistant per genotype done March 2012 - contracted 2002    . MRSA (methicillin resistant staph aureus) culture positive   . PNA (pneumonia)    Blood cultures have been negative for fungemia AFB Smear done in March 2012 which was negative Quantiferon assay indeterminate PCP PCR negative in March 2012  Breast abscess on right breast s/p I&D March 2012 - MRSA  Anemia of chronic Disease with iron deficiency - last anemia panel 05/2010 Fe<10, Ferritin 113 Mood disorder NOS refusing medications  Family History: Father: alive with Hypertension Mother: deceased, ESRD  History   Social History  . Marital Status: Single    Spouse Name:  N/A    Number of Children: One son 90 years old   . Years of Education: Completed 11th grade   Occupational History  . [employer]   Social History Main Topics  . Smoking status: Never Smoker   . Smokeless tobacco: Never Used  . Alcohol Use: No  . Drug Use: No  . Sexually Active: No    Review of Systems: Pertinent items are noted in HPI.  Physical Exam:  There were no vitals filed for this visit. LMP 06/18/2010 General appearance: alert, appears stated age, mild distress and slowed mentation Head: Normocephalic, without obvious abnormality, atraumatic Eyes: conjunctivae/corneas clear. PERRL, EOM's  intact. Fundi benign. Nose: Nares normal. Septum midline. Mucosa normal. No drainage or sinus tenderness. Throat: Dry MMM Neck: no adenopathy, supple, symmetrical, trachea midline and thyroid not enlarged, symmetric, no tenderness/mass/nodules Lungs: diminished breath sounds bilaterally fair air movement, no wheezes heard  Heart: regular rate and rhythm, S1, S2 normal, no murmur, click, rub or gallop Abdomen: soft, non-tender; bowel sounds normal; no masses,  no organomegaly Extremities: extremities normal, atraumatic, no cyanosis or edema Pulses: 2+ and symmetric Skin: Skin color, texture, turgor normal. No rashes or lesions Neurologic: Grossly normal Psych: flat affect   Lab results:  CBC:    Component Value Date/Time   WBC 13.4* 07/30/2010 1446   HGB 9.2* 07/30/2010 1446   HCT 28.8* 07/30/2010 1446   PLT 411* 07/30/2010 1446   MCV 72.7* 07/30/2010 1446   NEUTROABS 9.8* 07/30/2010 1446   LYMPHSABS 1.6 07/30/2010 1446   MONOABS 2.0* 07/30/2010 1446   EOSABS 0.0 07/30/2010 1446   BASOSABS 0.0 07/30/2010 1446     Comprehensive Metabolic Panel:    Component Value Date/Time   NA 133* 07/30/2010 1446   K 4.6 07/30/2010 1446   CL 94* 07/30/2010 1446   CO2 27 07/30/2010 1446   BUN 14 07/30/2010 1446   CREATININE 1.22* 07/30/2010 1446   CREATININE 0.66 04/15/2010 1206   GLUCOSE 93 07/30/2010 1446   GLUCOSE 118 04/01/2010   CALCIUM 8.7 07/30/2010 1446   AST 27 SLIGHT HEMOLYSIS 07/04/2010 2015   ALT 11 07/04/2010 2015   ALKPHOS 131* 07/04/2010 2015   BILITOT 0.2* 07/04/2010 2015   PROT 8.4* 07/04/2010 2015   ALBUMIN 2.0* 07/04/2010 2015     Lactic Acid, Venous                      1.1               0.5-2.2          Mmol/L  Imaging results:   IMPRESSION:   Diffuse bilateral nodular air space disease, left greater than   right, with slight progression on the left from 07/11/2010.  Left   lower lobe consolidation, as before. Atypical or fungal pneumonia   is favored.   Assessment & Plan by  Problem:  1.) Persistent Pneumonia - differential diagnosis in this patient with AIDS is vast and includes PCP Pneumonia, TB, fungal, and HAP. Patient needs CCM for bronchoscopy for further diagnostic evaluation, however continues to refuse procedures. Have discussed with Dr. Maurice March of infectious disease who also agrees that this pneumonia is progressive in nature and needs further intervention. ID on board.  Plan: -Blood cultures  -AFB smear -Sputum for gram stain, culture, PCP  -Empirically treat for PCP PNA with bactrim, O2 sats stable, do not need steroids at this time -Zyvox for HAP given Vanc allergy -Diflucan 100 mg IV QD  -  Try to convince patient to pursue further work-up for pneumonia   2.) HIV - Has not been taking ART 2/2 nausea. Will hold ART as hospital does not have liquid form.   3.) Anemia - iron deficiency. Hgb baseline 8-9 and patient currently at baseline. Last anemia panel done in May 2012 which was consistent with anemia of chronic disease and iron deficiency.

## 2010-07-31 DIAGNOSIS — B20 Human immunodeficiency virus [HIV] disease: Secondary | ICD-10-CM

## 2010-07-31 DIAGNOSIS — J189 Pneumonia, unspecified organism: Secondary | ICD-10-CM

## 2010-07-31 DIAGNOSIS — J852 Abscess of lung without pneumonia: Secondary | ICD-10-CM

## 2010-07-31 LAB — COMPREHENSIVE METABOLIC PANEL
Alkaline Phosphatase: 151 U/L — ABNORMAL HIGH (ref 39–117)
BUN: 12 mg/dL (ref 6–23)
CO2: 26 mEq/L (ref 19–32)
Chloride: 100 mEq/L (ref 96–112)
Creatinine, Ser: 0.93 mg/dL (ref 0.50–1.10)
GFR calc non Af Amer: >60 mL/min (ref >60)
Glucose, Bld: 123 mg/dL — ABNORMAL HIGH (ref 70–99)
Potassium: 3.3 mEq/L — ABNORMAL LOW (ref 3.5–5.1)
Total Bilirubin: 0.2 mg/dL — ABNORMAL LOW (ref 0.3–1.2)

## 2010-07-31 LAB — CBC
HCT: 24.3 % — ABNORMAL LOW (ref 36.0–46.0)
Hemoglobin: 7.6 g/dL — ABNORMAL LOW (ref 12.0–15.0)
MCV: 72.8 fL — ABNORMAL LOW (ref 78.0–100.0)
RBC: 3.34 MIL/uL — ABNORMAL LOW (ref 3.87–5.11)
WBC: 8.4 10*3/uL (ref 4.0–10.5)

## 2010-07-31 LAB — MAGNESIUM: Magnesium: 1.3 mg/dL — ABNORMAL LOW (ref 1.5–2.5)

## 2010-07-31 LAB — LACTATE DEHYDROGENASE: LDH: 220 U/L (ref 94–250)

## 2010-07-31 LAB — STREP PNEUMONIAE URINARY ANTIGEN: Strep Pneumo Urinary Antigen: NEGATIVE

## 2010-08-01 DIAGNOSIS — J129 Viral pneumonia, unspecified: Secondary | ICD-10-CM

## 2010-08-01 DIAGNOSIS — J852 Abscess of lung without pneumonia: Secondary | ICD-10-CM

## 2010-08-01 DIAGNOSIS — B20 Human immunodeficiency virus [HIV] disease: Secondary | ICD-10-CM

## 2010-08-01 LAB — LEGIONELLA ANTIGEN, URINE: Legionella Antigen, Urine: NEGATIVE

## 2010-08-02 ENCOUNTER — Ambulatory Visit: Payer: Medicaid Other | Admitting: Adult Health

## 2010-08-02 DIAGNOSIS — J189 Pneumonia, unspecified organism: Secondary | ICD-10-CM

## 2010-08-02 LAB — EXPECTORATED SPUTUM ASSESSMENT W GRAM STAIN, RFLX TO RESP C

## 2010-08-02 NOTE — Consult Note (Signed)
NAMESANTOS, Paula Becker NO.:  [id number]  MEDICAL RECORD NO.:  [id number]  LOCATION:  6732                         FACILITY:  [facility]  PHYSICIAN:  Cherylynn Ridges, M.D.    DATE OF BIRTH:  06/14/1975  DATE OF CONSULTATION:  07/05/2010 DATE OF DISCHARGE:                                CONSULTATION   REASON FOR CONSULTATION:  Breast abscess followup.  REQUESTING PHYSICIAN:  Dr. Cathey Endow.  BRIEF HISTORY:  The patient is a 35 year old African American female with HIV with a CD-4 of 30 and a viral load of 16,500 in March 2012. She is readmitted now for persistent cough, weakness ongoing since May. The patient reports a breast biopsy on the right back in [place] in February 2012.  She was seen and hospitalized on April 05, 2010, for a breast abscess that subsequently ensued, and she underwent incision and drainage of the abscess on April 04, 2010, and was taken back to the OR for dressing change under anesthesia on April 06, 2010.  At the time of this admission, she was not on any antiretroviral therapy.  Since discharge, she has been treated now, but has been intermittently compliant.  She reports her breast is still tender, but she does the dressing change herself, and she says she was seen in the office 2 weeks ago.  PAST MEDICAL HISTORY: 1. Hospital-acquired pneumonia, May 31, 2010, through Jun 06, 2010. 2. AIDS with medication noncompliance. 3. Anemia, chronic in nature, baseline is around 7.0. 4. MRSA multiloculated right breast abscess with I and D on April 04, 2010, and dressing change on April 06, 2010, under anesthesia. 5. Protein-calorie malnutrition. 6. Mood disorder.  PAST SURGICAL HISTORY:  I and D of the right breast on April 04, 2010, and dressing change on April 06, 2010.  FAMILY HISTORY:  Mother died of cardiac issues.  Father is living in good health.  Siblings are living in good health per the patient.  SOCIAL HISTORY:  Negative for tobacco,  alcohol, or drugs.  REVIEW OF SYSTEMS:  Positive for fever, decreased p.o. intake.  She is not sure about her weight.  Positive for nausea, no vomiting.  She denied any diarrhea or constipation.  Positive for productive cough which will go away.  She notes her breast is still tender but she can do the dressing change herself.  The remainder of the ten-point review of systems is negative.  MEDICATIONS PRESCRIBED: 1. Abacavir 300 mg daily. 2. Azithromycin 1200 mg daily. 3. Fluconazole 100 mg daily. 4. Lamivudine 300 mg daily. 5. Multivitamin one daily. 6. Protein/calorie supplements t.i.d. 7. MiraLax 17 g daily. 8. Septra DS one b.i.d.  CURRENT MEDICATIONS: 1. Azithromycin 1200 mg daily. 2. Maxipime 2 g q.12 h. 3. Ensure 1 can t.i.d. 4. Pepcid 20 mg b.i.d. 5. Ferrous sulfate 325 mg t.i.d. 6. Diflucan 100 mg daily. 7. Levofloxacin 750 mg at night. 8. Zyvox 600 mg q.12 h. 9. Septra 160 mg daily. 10.Tylenol p.r.n. 11.MiraLax p.r.n. 12.Ambien 5 mg nightly p.r.n.  ALLERGIES:  VANCOMYCIN.  PHYSICAL EXAMINATION:  GENERAL:  This is an ill-appearing, slightly lethargic [place] American female who currently feels febrile. VITAL  SIGNS:  Her last temperature was 98.7, heart rate is 120, blood pressure last recorded one is 113/68, respiratory rate is 18, sats are 97%. HEENT:  Head:  Normocephalic.  Eyes, ears, nose, and throat are essentially normal. NECK:  Trachea is in the midline.  Thyroid is nonpalpable.  There is no JVD.  No thyromegaly. CHEST:  She has rales more on the left than the right.  Poor inspiratory effort.  The chest exam shows there is an open area packed with a single 2 x 2 in the right breast under the areola.  It is pink and appears to be doing well.  It is still slightly tender. CARDIAC:  Normal S1 and S2, slightly tachycardic.  No murmurs or rubs. ABDOMEN:  Positive bowel sounds, nontender, nondistended.  No masses, hernia, or abscess  noted. GENITOURINARY/RECTAL:  Deferred. LYMPHADENOPATHY:  None palpated, axillary or femoral or cervical areas. SKIN:  No changes except for the open area noted above. NEUROLOGIC:  No focal deficit.  Cranial nerves are grossly normal. PSYCH:  Flat affect.  LABORATORY DATA:  CD-4 done today is 30.  T-helpers are 2.  MRSA screen is positive.  Magnesium is 1.3.  White count is 13.9, hemoglobin 6.8, hematocrit 22.  LDH is elevated at 365.  Electrolytes are normal, BUN is 12, creatinine is 0.97, glucose is 99, total bilirubin 0.2, alk phos 131, SGOT is 27, SGPT is 11, total protein is 8.4, albumin is 2.  Diagnostic chest x-ray shows infiltrates in the left upper and left lower lobe.  IMPRESSION: 1. Pneumonia with human immunodeficiency virus, CD-4 count 30. 2. Right breast abscess, granulating in. 3. Acquired immune deficiency syndrome with medication noncompliance. 4. Anemia. 5. Methicillin-resistant Staphylococcus aureus positive. 6. Mood disorder.  PLAN:  The dressing in the right breast is clean and pink, it appears to be granulating in well.  No other significant tenderness or cystic areas were noted.  We would continue the current dressing changes.  We will review with Dr. Lindie Spruce.     Eber Hong, P.A.   ______________________________ Cherylynn Ridges, M.D.    WDJ/MEDQ  D:  07/05/2010  T:  07/05/2010  Job:  [id number]  cc:   Teaching Service  Electronically Signed by Sherrie George P.A. on 07/19/2010 07:19:56 PM Electronically Signed by Jimmye Norman M.D. on 08/02/2010 08:15:01 AM

## 2010-08-03 ENCOUNTER — Inpatient Hospital Stay (HOSPITAL_COMMUNITY): Payer: Medicaid Other

## 2010-08-04 NOTE — Consult Note (Signed)
NAMEGEORGIA, Becker NO.:  [id number]  MEDICAL RECORD NO.:  [id number]  LOCATION:  4507                         FACILITY:  [facility]  PHYSICIAN:  Rhayne Chatwin L. Ladona Ridgel, MD  DATE OF BIRTH:  October 14, 1975  DATE OF CONSULTATION:  08/02/2010 DATE OF DISCHARGE:                                CONSULTATION   REQUESTING PHYSICIAN:  Paula Cosier, MD.  COLLABORATING PHYSICIAN:  Paula Pangallo L. Ladona Ridgel, MD.  Consult is for review of medical treatment options, clarification of goals of care, and symptom recommendations.  This nurse practitioner, Paula Becker, reviewed medical records, received report from team, assessed the patient, and then met at the patient's bedside along with her father, Paula Becker, phone number [phone] and his wife, Paula Becker to discuss diagnosis, prognosis, goals of care, and options.  PROBLEM LIST: 1. Human immunodeficiency virus contracted in 06-11-2000 with extreme     noncompliance with medication regime. 2. Pneumonia. 3. Anemia. 4. Palliative performance scale 50% at best.  THE PATIENT'S GOALS/RECOMMENDATIONS: 1. Full code. 2. Continue to process this difficult situation work with the medical     professionals and her family in an attempt to form functional     coping mechanisms to deal with this chronic devastating disease.  A detailed discussion was had today regarding this patient specifically and the natural trajectory of her HIV disease and diagnosis in light of noncompliance.  A long conversation uncovered psychosocial factors influencing the patient's present behaviors.  Per her father, she has always had learning disabilities requiring special education.  She was raised by her mother until age 33 and then by her father and stepmother at that point in time.  Her mother died of HIV around the year 1998-06-12. The patient has a 67 year old son, who lives with his grandfather.  All four parties, the patient, father, stepmother, and  son are new to the [place] area and they have only been residence of [facility] for the last year and half.  She is open to conversation with the palliative medicine, social worker at this time.  She and her family were encouraged to seek outside counseling on discharge.  Multiple strategies and techniques to enhance motivation were discussed in detail.  This is a complex multidimensional situation that would take extensive in depth therapy for any hopeful change.  The second part of this conversation focused around advance directives, concepts specific to code status, artificial feeding and hydration, continued use of IV antibiotics, and rehospitalization was had.  The difference between an aggressive medical intervention path and a palliative comfort care path for this patient at this time in this situation was detailed.  It was strongly pointed out to the patient and her family that there are clearly treatment options available to manage her HIV diagnosis, but that she must by into and be compliant with the medical interventions themselves.  The importance of designating a healthcare power of attorney was stressed.  Total time spent on the unit was 90 minutes.  Time in 8:15.  Time out 9:45.  Greater than 50% of the time was spent in counseling coordination of care.  Diagnosis and prognosis was clarified.  The concept of palliative care  was discussed.  Values and goals of care important to the patient and family were attempted to be elicited.  Dr. Jerene Becker was present for most of this consult meeting and is aware of discussion that was had.  This nurse practitioner will continue to support holistically while the patient is hospitalized and strongly encourages the patient and family to seek psychological support on discharge.  I will attempt to research HIV support groups here in the area and get that information to the patient.  Again, Palliative Medicine will  continue to support holistically.  Paula Becker, Child psychotherapist with Palliative Medicine Team will follow up with this patient. The patient is encouraged to call with questions or concerns.  Any questions or concerns regarding this consult, please call Paula Becker.     Paula Pun, NP   ______________________________ Paula Caper. Ladona Ridgel, MD    MCL/MEDQ  D:  08/02/2010  T:  08/03/2010  Job:  [id number]  cc:   Paula Cosier, MD  Electronically Signed by Paula Creed NP on 08/03/2010 11:49:24 AM Electronically Signed by Paula Mis MD on 08/04/2010 02:09:18 PM

## 2010-08-05 DIAGNOSIS — J852 Abscess of lung without pneumonia: Secondary | ICD-10-CM

## 2010-08-05 DIAGNOSIS — J189 Pneumonia, unspecified organism: Secondary | ICD-10-CM

## 2010-08-05 DIAGNOSIS — B20 Human immunodeficiency virus [HIV] disease: Secondary | ICD-10-CM

## 2010-08-05 LAB — CULTURE, BLOOD (ROUTINE X 2)
Culture  Setup Time: 201206302010
Culture: NO GROWTH
Culture: NO GROWTH

## 2010-08-05 LAB — CULTURE, RESPIRATORY W GRAM STAIN

## 2010-08-06 LAB — BASIC METABOLIC PANEL
Calcium: 7.6 mg/dL — ABNORMAL LOW (ref 8.4–10.5)
GFR calc Af Amer: >60 mL/min (ref >60)
GFR calc non Af Amer: >60 mL/min (ref >60)
Potassium: 2.8 mEq/L — ABNORMAL LOW (ref 3.5–5.1)
Sodium: 141 mEq/L (ref 135–145)

## 2010-08-06 LAB — CBC
MCH: 22.9 pg — ABNORMAL LOW (ref 26.0–34.0)
MCHC: 31.9 g/dL (ref 30.0–36.0)
Platelets: 486 10*3/uL — ABNORMAL HIGH (ref 150–400)

## 2010-08-06 LAB — CRYPTOCOCCAL ANTIGEN: Crypto Ag: NEGATIVE

## 2010-08-12 ENCOUNTER — Telehealth (INDEPENDENT_AMBULATORY_CARE_PROVIDER_SITE_OTHER): Payer: Self-pay | Admitting: Surgery

## 2010-08-12 ENCOUNTER — Telehealth: Payer: Self-pay | Admitting: *Deleted

## 2010-08-12 NOTE — Telephone Encounter (Signed)
Marene, the pharmacist from [facility] asked for a change from levaquin to avelox as medicaid will not pay for the levaquin. NP responded with ok. 400mg  one per day x 21 days. Read back to pharmacist & he confirmed it back to me. NP present during call. He will change this

## 2010-08-15 ENCOUNTER — Ambulatory Visit (INDEPENDENT_AMBULATORY_CARE_PROVIDER_SITE_OTHER): Payer: Medicaid Other | Admitting: Adult Health

## 2010-08-15 ENCOUNTER — Ambulatory Visit
Admission: RE | Admit: 2010-08-15 | Discharge: 2010-08-15 | Disposition: A | Discharge status: Home / Self Care | Payer: Medicaid Other | Source: Ambulatory Visit | Attending: Adult Health | Admitting: Adult Health

## 2010-08-15 ENCOUNTER — Encounter: Payer: Self-pay | Admitting: Adult Health

## 2010-08-15 VITALS — BP 103/69 | HR 129 | Temp 98.2°F | Wt 122.1 lb

## 2010-08-15 DIAGNOSIS — E43 Unspecified severe protein-calorie malnutrition: Secondary | ICD-10-CM

## 2010-08-15 DIAGNOSIS — J189 Pneumonia, unspecified organism: Secondary | ICD-10-CM | POA: Insufficient documentation

## 2010-08-15 MED ORDER — ENSURE PLUS HN PO LIQD: 1.0000 | Freq: Three times a day (TID) | ORAL | Status: DC

## 2010-08-15 NOTE — Progress Notes (Signed)
  Subjective:    Patient ID: Paula Becker, female    DOB: 07-27-1975, 35 y.o.   MRN: [id number]  HPI Recently, discharge from the hospital for another episode of bacterial pneumonia. She relates, that she's been adherent to her medications. However, her bridge counselor feels there are some major adherence issues with her medications. Currently, she states she feels better than she did when she was in the hospital, but she still has some considerable fatigue and weakness. Does relate her displeasure to taking either liquid form of medications, or the pill forms.   Review of Systems  Constitutional: Positive for diaphoresis, activity change, appetite change, fatigue and unexpected weight change. Negative for fever.  HENT: Negative.   Eyes: Negative.   Respiratory: Positive for cough and shortness of breath.   Cardiovascular: Negative.   Gastrointestinal: Positive for nausea.  Genitourinary: Negative.   Musculoskeletal: Positive for myalgias and arthralgias.  Skin: Negative.   Neurological: Negative.   Hematological: Negative.   Psychiatric/Behavioral: Negative.        Objective:   Physical Exam  Constitutional: She is oriented to person, place, and time. She appears well-developed. No distress.       Underweight and chronically ill appearing.  HENT:  Head: Normocephalic and atraumatic.  Mouth/Throat: Oropharynx is clear and moist.  Eyes: Conjunctivae and EOM are normal. Pupils are equal, round, and reactive to light.  Neck: Normal range of motion. Neck supple.  Cardiovascular: Regular rhythm, normal heart sounds and intact distal pulses.        Pulses tachycardia  Pulmonary/Chest: Effort normal.       Markedly diminished breath sounds in bilateral anterior and posterior lung fields.  Abdominal: Soft. Bowel sounds are normal.  Musculoskeletal: Normal range of motion.  Neurological: She is alert and oriented to person, place, and time. No cranial nerve deficit. She exhibits normal  muscle tone. Coordination normal.  Skin: Skin is warm and dry.  Psychiatric: She has a normal mood and affect. Her behavior is normal. Judgment and thought content normal.          Assessment & Plan:  1. Pneumonia. Has been placed back on Avelox therapy to complete a full 21 day course. Emphasized the seriousness of repeated treatment failures, associated with her pneumonia and sepsis. It was emphasized, completing therapy regardless of how she is feeling is imperative to her health, and, perhaps, her life. Followup chest x-ray will be done today (PA and LAT.).  2. HIV. We also discussed in detail possible issues regarding adherence to her medications. She emphatically claims she was taking her medications as they were prescribed, even if she does not like taking them. I suggested that we repeat labs for her in 2 weeks to determine exactly how effective her therapy is. We will ask that she return to clinic in 2 weeks for followup. She is also to be seen by medication. Adherence nurse this week. At present, she is to continue all medications as prescribed.  3. Malnourished. Ensure HN 1 can by mouth 3 times a day between meals.  Verbally acknowledged all information was provided to her and agreed with plan of care.
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
## 2010-08-19 ENCOUNTER — Other Ambulatory Visit: Payer: Self-pay | Admitting: *Deleted

## 2010-08-19 ENCOUNTER — Encounter: Payer: Self-pay | Admitting: Infectious Diseases

## 2010-08-19 ENCOUNTER — Ambulatory Visit (INDEPENDENT_AMBULATORY_CARE_PROVIDER_SITE_OTHER): Payer: Medicaid Other | Admitting: Infectious Diseases

## 2010-08-19 DIAGNOSIS — B20 Human immunodeficiency virus [HIV] disease: Secondary | ICD-10-CM

## 2010-08-19 DIAGNOSIS — A609 Anogenital herpesviral infection, unspecified: Secondary | ICD-10-CM | POA: Insufficient documentation

## 2010-08-19 DIAGNOSIS — E43 Unspecified severe protein-calorie malnutrition: Secondary | ICD-10-CM

## 2010-08-19 DIAGNOSIS — B009 Herpesviral infection, unspecified: Secondary | ICD-10-CM

## 2010-08-19 MED ORDER — DARUNAVIR ETHANOLATE 400 MG PO TABS: 800.0000 mg | ORAL_TABLET | Freq: Every day | ORAL | Status: DC

## 2010-08-19 MED ORDER — ENSURE PLUS HN PO LIQD: 1.0000 | Freq: Three times a day (TID) | ORAL | Status: DC

## 2010-08-19 MED ORDER — RITONAVIR 80 MG/ML PO SOLN: 100.0000 mg | Freq: Every day | ORAL | Status: DC

## 2010-08-19 MED ORDER — ACYCLOVIR 200 MG/5ML PO SUSP: 400.0000 mg | Freq: Three times a day (TID) | ORAL | Status: AC

## 2010-08-19 MED ORDER — ACYCLOVIR 200 MG/5ML PO SUSP: 400.0000 mg | Freq: Three times a day (TID) | ORAL | Status: DC

## 2010-08-19 MED ORDER — ABACAVIR SULFATE 20 MG/ML PO SOLN: 600.0000 mg | Freq: Every day | ORAL | Status: DC

## 2010-08-19 MED ORDER — EMTRICITABINE 10 MG/ML PO SOLN: 240.0000 mg | Freq: Every day | ORAL | Status: DC

## 2010-08-19 NOTE — Progress Notes (Signed)
  Subjective:    Patient ID: Lauris Chroman, female    DOB: 21-Nov-1975, 35 y.o.   MRN: [id number]  HPI 35 yo Fwith hx of noncompliance, AIDS (prev on abacavir/epivir/isentress but could not handle the pills, wanted liquids), anemia, and MRSA breast abscess (March 2012).  She  was admitted between June 5 and July 11, 2010 for necrotizing pneumonia. She had a CD-4  count of 30 and VL 12,000 (July 05, 2010). At that time, she refused bronchoscopy. She was admitted July 30, 2010 with persistent pneumonia. Her cough was associated with productive green sputum with  fever and chills. She also noted some chest pain with coughing. CT scan of the chest showed bilateral upper lobe, left  greater than right pneumonic infiltrates with necrotizing pattern and also left lower lobe infiltrate with necrotizing pattern. She was seen by pulmonary but refused bronch. Crypto (-), quantiferon gold indeterminate.  Sputum Cx grew few MRSA (S-tet, bactrim). Sputum AFB (-). A repeat CXR (08-15-10) : Little change in basilar opacities most consistent with pneumonia with probable small effusions. Here today due to a sore on her bottom. Has had for a couple of months. Red and bleeds.   Her previous SOB has improved. No f/c. Has occas prod cough but has improved.     Review of Systems  Constitutional: Negative for unexpected weight change.       Wt has increased.   Respiratory: Positive for cough. Negative for shortness of breath.   Gastrointestinal: Negative for abdominal pain, diarrhea and constipation.  Genitourinary: Negative for dysuria.       Objective:   Physical Exam  Constitutional: She appears well-nourished.  Eyes: EOM are normal. Pupils are equal, round, and reactive to light.  Cardiovascular: Normal rate, regular rhythm and normal heart sounds.   Pulmonary/Chest: Effort normal and breath sounds normal. No respiratory distress. She has no wheezes. She has no rales.  Abdominal: Soft. Bowel sounds are  normal. She exhibits no distension. There is no tenderness.  Genitourinary:     Lymphadenopathy:    She has no cervical adenopathy.  Skin:             Assessment & Plan:

## 2010-08-19 NOTE — Progress Notes (Signed)
Addended by: Niang Mitcheltree C on: 08/19/2010 03:40 PM   Modules accepted: Orders

## 2010-08-19 NOTE — Assessment & Plan Note (Signed)
States she is takin gher meds. Will check her CD4 and VL today.

## 2010-08-19 NOTE — Assessment & Plan Note (Signed)
Would like rx for ensure.

## 2010-08-19 NOTE — Assessment & Plan Note (Signed)
Will start pt on acyclovir. Will f/u prn.

## 2010-08-20 NOTE — Discharge Summary (Signed)
Paula Becker, Paula Becker NO.:  [id number]  MEDICAL RECORD NO.:  [id number]  LOCATION:  4507                         FACILITY:  [facility]  PHYSICIAN:  Doneen Poisson, MD     DATE OF BIRTH:  1975/02/07  DATE OF ADMISSION:  07/30/2010 DATE OF DISCHARGE:  08/06/2010                              DISCHARGE SUMMARY   CONSULTANTS:  [facility]  DISCHARGE DIAGNOSES: 1. Bilateral chronic pneumonia. 2. Anemia (human immunodeficiency virus related). 3. 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  DISCHARGE MEDICATIONS: 1. Septra 320/1600 mg b.i.d. liquid suspension for 3 weeks. 2. Levaquin 500 mg daily for 3 weeks liquid suspension. 3. Zithromax 250 mg daily liquid suspension for 3 weeks. 4. Diflucan 100 mg liquid suspension daily for 3 weeks.  Paula Becker was discharged home in stable condition and was instructed to follow up with the [facility] to call and make appointment.  The Infectious Disease Clinic was also contacted and told to expect Paula Becker to be calling and was asked to call her  if she did not follow up with them.  At followup, it should be assessed  if she has been taking her antibiotics which have been prescribed in  liquid form as requested by Paula Becker.  PROCEDURES PERFORMED:  None.  She was scheduled to have a bronchoscopy, but declined this procedure.  CONSULTATIONS:  Infectious Disease  BRIEF ADMITTING HISTORY AND PHYSICAL:  Paula Becker is a 35 year old woman with a past medical history significant for HIV/AIDS for the last 10 years, who has been noncompliant with antiretroviral therapy.  She has also been nonadherent to her antibiotic prophylaxis for the same duration.  Last known CD-4 was 30 on July 05, 2010.  She was admitted with a history of chronic bilateral pneumonia over the past 5 months and she has had several admissions for similar  complaints, and a mood disorder not otherwise specified.  She presents to the emergency room with a history of cough for the last 8 months that has been persistent since her last discharge on July 11, 2010.  During the last admission, she was admitted for necrotizing pneumonia and treated with IV antibiotics, and later transitioned to Avelox on discharge.  She states that she has not taken her Avelox or antiretroviral therapy due to nausea.  As noted, she does have a long history of noncompliance with treatment and she reports on presentation this admission that she has a continued cough with productive green sputum, fever and chills.  She also notes some chest pain while coughing, but denies nausea, vomiting, constipation, diarrhea or changes in urinary or bowel character.  VITAL SIGNS:  Temperature 96.1, pulse 89, blood pressure 84/49,  respirations 22, O2 sat 89 on room air. LUNGS:  Diminished breath sounds bilaterally.  Fair air movement.  No  wheezes. HEART:  Regular rate and rhythm.  Normal S1, S2.  No murmurs, rubs or gallops. ABDOMEN:  Soft, nontender, normal bowel sounds.  No masses. NEUROLOGIC:  Grossly normal. PSYCH:  Flat affect.  ADMISSION LABS:  White blood cell count 13.4, hemoglobin 9.2, hematocrit  28.8, platelets 411.  Sodium 133, potassium 4.6, chloride 94, carbon  dioxide 27, BUN 14, creatinine 1.22, glucose 93.  Chest x-ray was significant for diffuse bilateral nodular airspace  disease, left greater than right, with slight progression on the left  when compared to July 11, 2010.  Left lower lobe consolidation and  atypical or fungal pneumonia was favored.  HOSPITAL COURSE BY PROBLEM: 1. Cough and shortness of breath.  Paula Becker was admitted for     chronic pneumonia and started on broad-spectrum antibiotics.     Chest x-ray revealed worsening disease when compared to film from 3     weeks prior.  Sputum examination was positive for gram positive     cocci  in pairs and clusters, but was negative for AFB, and urine     was negative for Legionella and streptococcal antigen.  With Ms.     Becker's history of drug noncompliance, she was started on IV      medicines and was compliant for the duration of her hospitalization.       However, she repeatedly declined bronchoscopy to properly evaluate      the source of her bilateral pneumonia, at which point the Palliative      Care team was consulted to discuss further goals of care.  With     multiple admissions for this problem and continually     refusing care and being noncompliant as an outpatient, the Primary     team felt it was necessary for the patient to consider what her     goals are moving forward.  After multiple meetings with Palliative     Care, no conclusive goal could be reached, but she decided     she would try taking liquid medication that maybe she would be able     to be more adherent with.  Her father and mother-in-law were     present for these meetings and along with the Primary team and     Palliative Care and a prior Psych evaluation for March, all agreed     that she was capable of making decisions as an adult.     Consequently, she decided that she was ready to go home and     agreed to take her liquid broad-spectrum antibiotic regimen.  This     continues to be a suboptimal treatment for an unknown infection,     but it is what she wants at this time.  She was discharged with ID      Clinic followup to assess the treatment of this unknown pneumonia.  2. HIV/AIDS.  Paula Becker has been noncompliant with HAART therapy for     the last 10 years, most recent CD4 count of 30.  She continued to     decline HAART therapy in the hospital and has followup with ID     Clinic to decide on future medications.  There are various liquid     forms of HAART therapies available according to pharmacy and there     are regimens that would work for her.  However, just because these       formulations are available does not mean that she will be compliant      with them.  3. Hypokalemia.  Upon discharge, she was found to be hypokalemic with      a potassium of 2.8.  We attempted to replete her potassium with      both oral potassium and IV potassium and  she refused both treatment.  DISCHARGE LABS AND VITALS:  Temperature 97.1, pulse 91, respiratory  rate 20, and blood pressure 127/87 with an O2 saturation of 98%. White blood cell count 3.9, hemoglobin 7.2, hematocrit 22.6, platelets  486.  Sodium 141, potassium 2.8, chloride 106, CO2 25, BUN less than 3, creatinine 0.68, glucose 85.   ______________________________ Kathreen Cosier, MD   ______________________________ Doneen Poisson, MD   BW/MEDQ  D:  08/07/2010  T:  08/07/2010  Job:  [id number]  Electronically Signed by Kathreen Cosier MD on 08/08/2010 04:23:17 PM Electronically Signed by Doneen Poisson  on 08/20/2010 10:11:21 AM

## 2010-08-29 ENCOUNTER — Encounter: Payer: Self-pay | Admitting: Adult Health

## 2010-08-29 ENCOUNTER — Ambulatory Visit (INDEPENDENT_AMBULATORY_CARE_PROVIDER_SITE_OTHER): Payer: Medicaid Other | Admitting: Adult Health

## 2010-08-29 DIAGNOSIS — L0390 Cellulitis, unspecified: Secondary | ICD-10-CM

## 2010-08-29 DIAGNOSIS — A609 Anogenital herpesviral infection, unspecified: Secondary | ICD-10-CM

## 2010-08-29 DIAGNOSIS — L0291 Cutaneous abscess, unspecified: Secondary | ICD-10-CM

## 2010-08-29 DIAGNOSIS — B20 Human immunodeficiency virus [HIV] disease: Secondary | ICD-10-CM

## 2010-08-29 DIAGNOSIS — J189 Pneumonia, unspecified organism: Secondary | ICD-10-CM

## 2010-08-29 DIAGNOSIS — B009 Herpesviral infection, unspecified: Secondary | ICD-10-CM

## 2010-09-01 ENCOUNTER — Telehealth: Payer: Self-pay | Admitting: Licensed Clinical Social Worker

## 2010-09-01 NOTE — Telephone Encounter (Signed)
Noren from [facility] called stating that this patient was placed on 40ml of bactrim suspension twice daily which is 4 x higher dose than what she was on. Is this right for her?

## 2010-09-02 ENCOUNTER — Ambulatory Visit (INDEPENDENT_AMBULATORY_CARE_PROVIDER_SITE_OTHER): Payer: Medicaid Other | Admitting: Surgery

## 2010-09-02 ENCOUNTER — Encounter (INDEPENDENT_AMBULATORY_CARE_PROVIDER_SITE_OTHER): Payer: Self-pay | Admitting: Surgery

## 2010-09-02 VITALS — BP 96/72 | HR 88 | Temp 96.5°F

## 2010-09-02 DIAGNOSIS — N611 Abscess of the breast and nipple: Secondary | ICD-10-CM

## 2010-09-02 DIAGNOSIS — N61 Mastitis without abscess: Secondary | ICD-10-CM

## 2010-09-02 NOTE — Progress Notes (Signed)
CC: Followup breast abscess  HPI: This patient comes in for post op follow-up. She underwent operative drainage of a large right breast abscess on April 04, 2010. She feels that she is doing well.  PE: General: The patient appears to be healthy, NAD  Did breast has healed. There is mild deformity. There is no evidence of recurrence or mass.  IMPRESSION: The patient is doing well S/P breast abscess drainage.  DATA REVIWED: Noted. I reviewed the Epic chart  PLAN: Recheck here p.r.n.

## 2010-09-02 NOTE — Patient Instructions (Signed)
Come back if you have any more problems with your breast

## 2010-09-12 ENCOUNTER — Ambulatory Visit (INDEPENDENT_AMBULATORY_CARE_PROVIDER_SITE_OTHER): Payer: Medicaid Other | Admitting: Adult Health

## 2010-09-12 DIAGNOSIS — B20 Human immunodeficiency virus [HIV] disease: Secondary | ICD-10-CM

## 2010-09-13 ENCOUNTER — Telehealth: Payer: Self-pay | Admitting: Adult Health

## 2010-09-13 LAB — COMPREHENSIVE METABOLIC PANEL
ALT: 8 U/L (ref 0–35)
AST: 15 U/L (ref 0–37)
Albumin: 3.1 g/dL — ABNORMAL LOW (ref 3.5–5.2)
Alkaline Phosphatase: 99 U/L (ref 39–117)
BUN: 12 mg/dL (ref 6–23)
Potassium: 4.3 mEq/L (ref 3.5–5.3)

## 2010-09-13 LAB — T-HELPER CELL (CD4) - (RCID CLINIC ONLY)
CD4 % Helper T Cell: 3 % — ABNORMAL LOW (ref 33–55)
CD4 T Cell Abs: 40 uL — ABNORMAL LOW (ref 400–2700)

## 2010-09-13 LAB — CBC WITH DIFFERENTIAL/PLATELET
Basophils Absolute: 0 10*3/uL (ref 0.0–0.1)
Basophils Relative: 0 % (ref 0–1)
HCT: 27.2 % — ABNORMAL LOW (ref 36.0–46.0)
MCHC: 30.5 g/dL (ref 30.0–36.0)
Monocytes Absolute: 0.8 10*3/uL (ref 0.1–1.0)
Neutro Abs: 3.9 10*3/uL (ref 1.7–7.7)
Neutrophils Relative %: 59 % (ref 43–77)
Platelets: 462 10*3/uL — ABNORMAL HIGH (ref 150–400)
RDW: 19.1 % — ABNORMAL HIGH (ref 11.5–15.5)
WBC: 6.6 10*3/uL (ref 4.0–10.5)

## 2010-09-13 NOTE — Progress Notes (Signed)
Presents to clinic for labs today and voices no specific complaints. Will reschedule followup visit for 2 weeks, and we'll clarify order for, azithromycin, with [facility].

## 2010-09-13 NOTE — Telephone Encounter (Signed)
Traci Sermon: The client's pharmacy [facility], [place], [phone]) does not have the correct prescription information for Liah's azithromycin. The pharmacist, Nuren, needs a corrected prescription called in or faxed for the 1200mg  once weekly. The last prescription they have on file was s/p hospital discharge when the client was taking it daily at treatment dose. I'll be happy to assist in any way. Thanks, Carolin Guernsey

## 2010-09-14 LAB — AFB CULTURE WITH SMEAR (NOT AT ARMC): Acid Fast Smear: NONE SEEN

## 2010-09-14 NOTE — Telephone Encounter (Signed)
I called, and clarified. The order with [facility]. Yesterday. The order should be azithromycin suspension 6 teaspoons by mouth every week. He stated that he would have to fill the prescription approximately weekly as it is only good for 10 days in the suspension form. This should be interesting.

## 2010-09-22 LAB — HIV-1 GENOTYPR PLUS

## 2010-09-26 ENCOUNTER — Ambulatory Visit (INDEPENDENT_AMBULATORY_CARE_PROVIDER_SITE_OTHER): Payer: Medicaid Other | Admitting: Adult Health

## 2010-09-26 ENCOUNTER — Encounter: Payer: Self-pay | Admitting: Adult Health

## 2010-09-26 VITALS — BP 114/74 | HR 79 | Temp 98.2°F | Ht 69.0 in | Wt 123.0 lb

## 2010-09-26 DIAGNOSIS — B20 Human immunodeficiency virus [HIV] disease: Secondary | ICD-10-CM

## 2010-10-04 ENCOUNTER — Ambulatory Visit (INDEPENDENT_AMBULATORY_CARE_PROVIDER_SITE_OTHER): Payer: Medicaid Other | Admitting: Adult Health

## 2010-10-04 ENCOUNTER — Encounter: Payer: Self-pay | Admitting: Adult Health

## 2010-10-04 ENCOUNTER — Telehealth: Payer: Self-pay | Admitting: *Deleted

## 2010-10-04 VITALS — BP 112/74 | HR 94 | Temp 98.3°F | Ht 68.0 in | Wt 120.0 lb

## 2010-10-04 DIAGNOSIS — R05 Cough: Secondary | ICD-10-CM

## 2010-10-04 DIAGNOSIS — L738 Other specified follicular disorders: Secondary | ICD-10-CM

## 2010-10-04 DIAGNOSIS — B20 Human immunodeficiency virus [HIV] disease: Secondary | ICD-10-CM

## 2010-10-04 DIAGNOSIS — Z91148 Patient's other noncompliance with medication regimen for other reason: Secondary | ICD-10-CM

## 2010-10-04 DIAGNOSIS — Z9114 Patient's other noncompliance with medication regimen: Secondary | ICD-10-CM

## 2010-10-04 DIAGNOSIS — L739 Follicular disorder, unspecified: Secondary | ICD-10-CM

## 2010-10-04 DIAGNOSIS — R053 Chronic cough: Secondary | ICD-10-CM

## 2010-10-04 NOTE — Telephone Encounter (Signed)
States she is having bilateral eye pain x 1 week. Wears prescription glasses. Last saw an eye dr more than 2 years ago when she lived in [place]. Denied blurry or double vision. She should see md first to rule out eye infection or other problems. She agreed & I transferred her to the front office.

## 2010-10-05 ENCOUNTER — Other Ambulatory Visit: Payer: Self-pay | Admitting: *Deleted

## 2010-10-07 ENCOUNTER — Encounter: Payer: Self-pay | Admitting: Adult Health

## 2010-10-07 NOTE — Progress Notes (Signed)
Ophthalmology appointment coordinated for [facility] at the request of B. Sundra Aland, NP. She is scheduled to see Dr. Nadyne Coombes, [address] @ 09:15 on September 24. The client has been informed of the appt. (general eye exam and retinal screen). Carolin Guernsey, Bridge Counselor

## 2010-10-11 NOTE — Progress Notes (Signed)
  Subjective:    Patient ID: Paula Becker, female    DOB: [birth year], 35 y.o.   MRN: [id number]  HPI Paula Becker returns to clinic today for scheduled followup. She endorses that she has been taking her medications. "Sometimes." In   Review of Systems     Objective:   Physical Exam        Assessment & Plan:

## 2010-10-13 ENCOUNTER — Telehealth: Payer: Self-pay | Admitting: Licensed Clinical Social Worker

## 2010-10-13 NOTE — Telephone Encounter (Signed)
[facility] called stating that the refill for Lortab suspension was damaged by the pharmacy and needed another refill authorization to fill medication. Verbal authorization x1.

## 2010-10-14 ENCOUNTER — Ambulatory Visit (INDEPENDENT_AMBULATORY_CARE_PROVIDER_SITE_OTHER): Payer: Medicaid Other | Admitting: Adult Health

## 2010-10-14 ENCOUNTER — Encounter: Payer: Self-pay | Admitting: Adult Health

## 2010-10-14 ENCOUNTER — Ambulatory Visit
Admission: RE | Admit: 2010-10-14 | Discharge: 2010-10-14 | Disposition: A | Discharge status: Home / Self Care | Payer: Medicaid Other | Source: Ambulatory Visit | Attending: Adult Health | Admitting: Adult Health

## 2010-10-14 VITALS — BP 120/78 | HR 88 | Temp 98.0°F | Wt 121.0 lb

## 2010-10-14 DIAGNOSIS — Z22322 Carrier or suspected carrier of Methicillin resistant Staphylococcus aureus: Secondary | ICD-10-CM

## 2010-10-14 DIAGNOSIS — B20 Human immunodeficiency virus [HIV] disease: Secondary | ICD-10-CM

## 2010-10-14 DIAGNOSIS — J189 Pneumonia, unspecified organism: Secondary | ICD-10-CM

## 2010-10-14 DIAGNOSIS — L738 Other specified follicular disorders: Secondary | ICD-10-CM

## 2010-10-14 DIAGNOSIS — R05 Cough: Secondary | ICD-10-CM | POA: Insufficient documentation

## 2010-10-14 DIAGNOSIS — L739 Follicular disorder, unspecified: Secondary | ICD-10-CM

## 2010-10-14 DIAGNOSIS — R918 Other nonspecific abnormal finding of lung field: Secondary | ICD-10-CM | POA: Insufficient documentation

## 2010-10-14 MED ORDER — DOXYCYCLINE CALCIUM 50 MG/5ML PO SYRP: 100.0000 mg | ORAL_SOLUTION | Freq: Two times a day (BID) | ORAL | Status: DC

## 2010-10-14 MED ORDER — CIPROFLOXACIN 250 MG/5ML (5%) PO SUSR: 500.0000 mg | Freq: Two times a day (BID) | ORAL | Status: DC

## 2010-10-14 NOTE — Progress Notes (Signed)
  Subjective:    Patient ID: Paula Becker, female    DOB: 03/26/1975, 35 y.o.   MRN: [id number]  HPI    Review of Systems     Objective:   Physical Exam        Assessment & Plan:

## 2010-10-17 NOTE — Progress Notes (Signed)
Pt's file for bridge counseling has been closed. Pt will continue to receive medical case management and treatment adherence services through Olin Hauser with [facility].

## 2010-11-03 ENCOUNTER — Other Ambulatory Visit: Payer: Medicaid Other

## 2010-11-09 ENCOUNTER — Telehealth: Payer: Self-pay | Admitting: *Deleted

## 2010-11-09 NOTE — Telephone Encounter (Signed)
Pharmacist called requesting refill on patient's bactrim and said that Traci Sermon, NP had given verbal order for her to take 3 x a week, but there is no note of this.  Sees Dr. Ninetta Lights on 11/17/10.  Wendall Mola CMA

## 2010-11-14 ENCOUNTER — Emergency Department (HOSPITAL_COMMUNITY)
Admission: EM | Admit: 2010-11-14 | Discharge: 2010-11-14 | Discharge status: Against Medical Advice | Payer: Medicaid Other | Attending: Emergency Medicine | Admitting: Emergency Medicine

## 2010-11-14 ENCOUNTER — Emergency Department (HOSPITAL_COMMUNITY): Payer: Medicaid Other

## 2010-11-14 ENCOUNTER — Telehealth: Payer: Self-pay | Admitting: *Deleted

## 2010-11-14 DIAGNOSIS — R05 Cough: Secondary | ICD-10-CM | POA: Insufficient documentation

## 2010-11-14 DIAGNOSIS — R059 Cough, unspecified: Secondary | ICD-10-CM | POA: Insufficient documentation

## 2010-11-14 NOTE — Telephone Encounter (Signed)
Patient called wanting to change her appointment for 11/17/10 with Dr. Ninetta Lights because she lost her Medicaid.  Told her she can still come to her appointment and see Barb for [employer] that day.  She also stated she is still coughing and a 10/14/10 xray showed pneumonia.  She said she never received the Bactrim that was prescribed for this. Dr. Daiva Eves looked at patient's xray and advised that she go to the ED.  I spoke with Kara Dies and she said she would call her father for a ride to the ED.  Pharmacist Naren at [facility] said that he did receive an order from Traci Sermon, NP for Bactrim daily.  Pharmacist will call clinic back tomorrow to see if patient went to the ED and if she needs the Bactrim delivered this will be done.   Wendall Mola CMA

## 2010-11-15 ENCOUNTER — Telehealth: Payer: Self-pay | Admitting: *Deleted

## 2010-11-15 NOTE — Telephone Encounter (Signed)
States she walked out of the ED as they were taking too long. She did not get a diagnosis. She did get the xray. Wants to know what to do next. appt this Thursday at 4pm. States she can make it

## 2010-11-17 ENCOUNTER — Ambulatory Visit (INDEPENDENT_AMBULATORY_CARE_PROVIDER_SITE_OTHER): Payer: Medicaid Other | Admitting: Infectious Diseases

## 2010-11-17 ENCOUNTER — Ambulatory Visit: Payer: Medicaid Other | Admitting: Infectious Diseases

## 2010-11-17 ENCOUNTER — Ambulatory Visit: Payer: Medicaid Other

## 2010-11-17 ENCOUNTER — Encounter: Payer: Self-pay | Admitting: Infectious Diseases

## 2010-11-17 DIAGNOSIS — A609 Anogenital herpesviral infection, unspecified: Secondary | ICD-10-CM

## 2010-11-17 DIAGNOSIS — L02818 Cutaneous abscess of other sites: Secondary | ICD-10-CM

## 2010-11-17 DIAGNOSIS — L03818 Cellulitis of other sites: Secondary | ICD-10-CM

## 2010-11-17 DIAGNOSIS — B20 Human immunodeficiency virus [HIV] disease: Secondary | ICD-10-CM

## 2010-11-17 DIAGNOSIS — Z23 Encounter for immunization: Secondary | ICD-10-CM

## 2010-11-17 DIAGNOSIS — B009 Herpesviral infection, unspecified: Secondary | ICD-10-CM

## 2010-11-17 MED ORDER — ACYCLOVIR 400 MG PO TABS: 400.0000 mg | ORAL_TABLET | Freq: Three times a day (TID) | ORAL | Status: DC

## 2010-11-17 MED ORDER — SULFAMETHOXAZOLE-TRIMETHOPRIM 800-160 MG PO TABS: 1.0000 | ORAL_TABLET | Freq: Once | ORAL | Status: DC

## 2010-11-17 MED ORDER — FOSAMPRENAVIR CALCIUM 50 MG/ML PO SUSP: 1400.0000 mg | Freq: Every day | ORAL | Status: DC

## 2010-11-17 MED ORDER — DOXYCYCLINE HYCLATE 100 MG PO TABS: 100.0000 mg | ORAL_TABLET | Freq: Two times a day (BID) | ORAL | Status: AC

## 2010-11-17 NOTE — Assessment & Plan Note (Signed)
Will give her repeat course of acyclovir for 7 days.

## 2010-11-17 NOTE — Assessment & Plan Note (Addendum)
Will change her to liquid lexiva and rtinoavir, abc/emt. Will have her back in 3-4 weeks for recheck. She needs to get ADAP re-enrolled.

## 2010-11-17 NOTE — Progress Notes (Signed)
  Subjective:    Patient ID: Paula Becker, female    DOB: January 27, 1976, 35 y.o.   MRN: [id number]  HPI 35 yo Fwith hx of noncompliance, AIDS (prev on abacavir/epivir/isentress but could not handle the pills, wanted liquids), anemia, and MRSA breast abscess (March 2012). She  was admitted between June 5 and July 11, 2010 for necrotizing pneumonia. She had a CD-4  count of 30 and VL 12,000 (July 05, 2010).  At that time, she refused bronchoscopy.  She was admitted July 30, 2010 with persistent pneumonia.  CT scan of the chest showed bilateral upper lobe, left greater than right pneumonic infiltrates with necrotizing pattern and also left lower lobe infiltrate with necrotizing pattern. She was seen by pulmonary but refused bronch. Crypto (-), quantiferon gold indeterminate. Sputum Cx grew few MRSA (S-tet, bactrim). Sputum AFB (-). A repeat CXR (08-15-10) : Little change in basilar opacities most consistent with pneumonia with probable small effusions. She has been having cough again- had repeat CXR (11-14-10): persistent nodular disease. Last CD4 40 and VL 10,500 (09-12-10).  Has sore on her R buttocks, can't sit on. Has had for ~1 week, describes as a boil. No fevers. Was seen in ED and had CXR, and was not given anything (had to leave to get her son).  Has missed med doses 4 doses in 2 weeks. Was supposed to be on DRVr/EMT/ABC but been taking DRV as she does not like to take pills. Has previously take KLT but made her nauseated sometimes. Has also previously taken other Rx while in [place].    Review of Systems  HENT: Positive for congestion.   Respiratory: Negative for cough and shortness of breath.   Gastrointestinal: Negative for diarrhea and constipation.  Genitourinary: Negative for dysuria.       Objective:   Physical Exam  Constitutional: She appears well-developed and well-nourished.  Eyes: EOM are normal. Pupils are equal, round, and reactive to light.  Neck: Neck supple.  Cardiovascular:  Normal rate, regular rhythm and normal heart sounds.   Pulmonary/Chest: Effort normal. She has decreased breath sounds in the right upper field, the right middle field, the right lower field, the left upper field, the left middle field and the left lower field.  Abdominal: Soft. Bowel sounds are normal. She exhibits no distension. There is no tenderness.  Lymphadenopathy:    She has no cervical adenopathy.  Skin:             Assessment & Plan:

## 2010-11-17 NOTE — Assessment & Plan Note (Signed)
Will send a Cx of this furuncle (prob MRSA). Will restart her doxycycline for 2 weeks. Will have her call back on Monday and if not better she may need I & D.

## 2010-11-18 ENCOUNTER — Other Ambulatory Visit: Payer: Self-pay | Admitting: *Deleted

## 2010-11-18 DIAGNOSIS — A609 Anogenital herpesviral infection, unspecified: Secondary | ICD-10-CM

## 2010-11-18 DIAGNOSIS — B20 Human immunodeficiency virus [HIV] disease: Secondary | ICD-10-CM

## 2010-11-18 DIAGNOSIS — L02818 Cutaneous abscess of other sites: Secondary | ICD-10-CM

## 2010-11-18 DIAGNOSIS — R918 Other nonspecific abnormal finding of lung field: Secondary | ICD-10-CM

## 2010-11-18 DIAGNOSIS — J189 Pneumonia, unspecified organism: Secondary | ICD-10-CM

## 2010-11-18 DIAGNOSIS — Z22322 Carrier or suspected carrier of Methicillin resistant Staphylococcus aureus: Secondary | ICD-10-CM

## 2010-11-18 MED ORDER — EMTRICITABINE 10 MG/ML PO SOLN: 240.0000 mg | Freq: Every day | ORAL | Status: DC

## 2010-11-18 MED ORDER — FOSAMPRENAVIR CALCIUM 50 MG/ML PO SUSP: 1400.0000 mg | Freq: Every day | ORAL | Status: DC

## 2010-11-18 MED ORDER — ABACAVIR SULFATE 20 MG/ML PO SOLN: 600.0000 mg | Freq: Every day | ORAL | Status: DC

## 2010-11-18 MED ORDER — AZITHROMYCIN 600 MG PO TABS: 1200.0000 mg | ORAL_TABLET | ORAL | Status: DC

## 2010-11-18 MED ORDER — RITONAVIR 80 MG/ML PO SOLN: 100.0000 mg | Freq: Every day | ORAL | Status: DC

## 2010-11-18 MED ORDER — SULFAMETHOXAZOLE-TRIMETHOPRIM 800-160 MG PO TABS: 1.0000 | ORAL_TABLET | Freq: Once | ORAL | Status: AC

## 2010-11-18 MED ORDER — ACYCLOVIR 400 MG PO TABS: 400.0000 mg | ORAL_TABLET | Freq: Three times a day (TID) | ORAL | Status: AC

## 2010-11-20 LAB — WOUND CULTURE

## 2010-11-21 ENCOUNTER — Ambulatory Visit: Payer: Medicaid Other

## 2010-11-23 ENCOUNTER — Ambulatory Visit: Payer: Medicaid Other

## 2010-11-23 ENCOUNTER — Ambulatory Visit: Payer: Medicaid Other | Admitting: Infectious Diseases

## 2010-11-30 ENCOUNTER — Other Ambulatory Visit: Payer: Self-pay | Admitting: *Deleted

## 2010-11-30 DIAGNOSIS — B20 Human immunodeficiency virus [HIV] disease: Secondary | ICD-10-CM

## 2010-11-30 MED ORDER — RITONAVIR 80 MG/ML PO SOLN: 100.0000 mg | Freq: Every day | ORAL | Status: DC

## 2010-11-30 MED ORDER — FOSAMPRENAVIR CALCIUM 50 MG/ML PO SUSP: 1400.0000 mg | Freq: Every day | ORAL | Status: DC

## 2010-11-30 MED ORDER — EMTRICITABINE 10 MG/ML PO SOLN: 240.0000 mg | Freq: Every day | ORAL | Status: DC

## 2010-11-30 MED ORDER — AZITHROMYCIN DIHYDRATE POWD: 1.0000 | Status: DC

## 2010-11-30 MED ORDER — ABACAVIR SULFATE 20 MG/ML PO SOLN: 600.0000 mg | Freq: Every day | ORAL | Status: DC

## 2010-11-30 MED ORDER — SULFAMETHOXAZOLE-TMP DS 800-160 MG PO TABS: 1.0000 | ORAL_TABLET | Freq: Every day | ORAL | Status: DC

## 2010-12-01 DEATH — deceased

## 2010-12-06 ENCOUNTER — Other Ambulatory Visit: Payer: Self-pay | Admitting: Infectious Diseases

## 2010-12-16 ENCOUNTER — Other Ambulatory Visit: Payer: Self-pay | Admitting: Infectious Diseases

## 2010-12-31 ENCOUNTER — Other Ambulatory Visit: Payer: Self-pay | Admitting: Infectious Diseases

## 2010-12-31 DIAGNOSIS — B20 Human immunodeficiency virus [HIV] disease: Secondary | ICD-10-CM

## 2011-01-03 ENCOUNTER — Telehealth: Payer: Self-pay | Admitting: *Deleted

## 2011-01-03 NOTE — Telephone Encounter (Signed)
Has not been taking her HIV meds. Wants to take all HIV meds in pill form now. Has taken Bactrim & acyclovir about 3 times a week. should be done with the acyclovir but still has pills left. Is on Adap. No longer on Medicaid. No money for food. Refuses to go to [facility] for help. Bjorn Loser gave her the Ensure but she will drink it only occasionally. Son is living with granddad. Wants to know if md is willing to change rx to all pills.uses [facility] on [address]

## 2011-01-04 ENCOUNTER — Other Ambulatory Visit: Payer: Self-pay | Admitting: Infectious Diseases

## 2011-01-04 DIAGNOSIS — B20 Human immunodeficiency virus [HIV] disease: Secondary | ICD-10-CM

## 2011-01-04 MED ORDER — ATAZANAVIR SULFATE 300 MG PO CAPS: 300.0000 mg | ORAL_CAPSULE | Freq: Every day | ORAL | Status: DC

## 2011-01-04 MED ORDER — RITONAVIR 100 MG PO TABS: 100.0000 mg | ORAL_TABLET | Freq: Every day | ORAL | Status: DC

## 2011-01-04 MED ORDER — ABACAVIR SULFATE-LAMIVUDINE 600-300 MG PO TABS: 1.0000 | ORAL_TABLET | Freq: Every day | ORAL | Status: DC

## 2011-01-05 ENCOUNTER — Telehealth: Payer: Self-pay | Admitting: *Deleted

## 2011-01-05 NOTE — Telephone Encounter (Signed)
Patient called back requesting HIV meds be changed back to liquid.  Said she spoke with the pharmacist and when she found out she can not crush the meds she wants them changed back.  Spoke with Bluford Kaufmann her case manager and she said patient was not taking the liquids either.  She will speak with Uri on Monday and find out for sure how she is going to take the medication and will call the clinic back. Wendall Mola CMA

## 2011-01-09 ENCOUNTER — Telehealth: Payer: Self-pay | Admitting: *Deleted

## 2011-01-09 ENCOUNTER — Other Ambulatory Visit: Payer: Self-pay | Admitting: Infectious Diseases

## 2011-01-09 DIAGNOSIS — B20 Human immunodeficiency virus [HIV] disease: Secondary | ICD-10-CM

## 2011-01-09 MED ORDER — ABACAVIR SULFATE 20 MG/ML PO SOLN: 600.0000 mg | Freq: Every day | ORAL | Status: DC

## 2011-01-09 MED ORDER — FOSAMPRENAVIR CALCIUM 50 MG/ML PO SUSP: 1400.0000 mg | Freq: Every day | ORAL | Status: DC

## 2011-01-09 MED ORDER — RITONAVIR 80 MG/ML PO SOLN: 100.0000 mg | Freq: Every day | ORAL | Status: DC

## 2011-01-09 MED ORDER — EMTRICITABINE 10 MG/ML PO SOLN: 200.0000 mg | Freq: Every day | ORAL | Status: DC

## 2011-01-09 NOTE — Telephone Encounter (Signed)
Pt wondering whether she should be taking all of the liquid HIV medications.  RN advised pt to take ALL of the liquid HIV medications.  Pt advised to bring ALL of her medications to her office visit with Dr. Ninetta Lights tomorrow.  Pt verbalized understanding and agreed to bring in all of her rxes.

## 2011-01-10 ENCOUNTER — Ambulatory Visit (INDEPENDENT_AMBULATORY_CARE_PROVIDER_SITE_OTHER): Payer: Self-pay | Admitting: Infectious Diseases

## 2011-01-10 ENCOUNTER — Encounter: Payer: Self-pay | Admitting: Infectious Diseases

## 2011-01-10 DIAGNOSIS — B20 Human immunodeficiency virus [HIV] disease: Secondary | ICD-10-CM

## 2011-01-10 DIAGNOSIS — R05 Cough: Secondary | ICD-10-CM

## 2011-01-10 DIAGNOSIS — A609 Anogenital herpesviral infection, unspecified: Secondary | ICD-10-CM

## 2011-01-10 DIAGNOSIS — B009 Herpesviral infection, unspecified: Secondary | ICD-10-CM

## 2011-01-10 NOTE — Assessment & Plan Note (Signed)
Will try to get sputum cx today, afb, fungus.

## 2011-01-10 NOTE — Assessment & Plan Note (Signed)
Change her ACV to prn

## 2011-01-10 NOTE — Assessment & Plan Note (Signed)
Has home help to help her get her fill her pill box. She has some conflict with her as "she did something which almost made me loose my apartment". Encouraged her to take her ART. Her vaccines are up to date. rtc in 6 weeks. Refuses condoms.

## 2011-01-10 NOTE — Progress Notes (Signed)
  Subjective:    Patient ID: Paula Becker, female    DOB: Mar 11, 1975, 35 y.o.   MRN: [id number]  HPI 35 yo Fwith hx of noncompliance, AIDS (prev on abacavir/epivir/isentress but could not handle the pills, wanted liquids), anemia, and MRSA breast abscess (March 2012). She  was admitted between June 5 and July 11, 2010 for necrotizing pneumonia. She had a CD-4  count of 30 and VL 12,000 (July 05, 2010). She refused bronchoscopy.  She was admitted July 30, 2010 with persistent pneumonia. CT scan of the chest showed bilateral upper lobe, left greater than right pneumonic infiltrates with necrotizing pattern and also left lower lobe infiltrate with necrotizing pattern. She was seen by pulmonary but refused bronch. Crypto (-), quantiferon gold indeterminate. Sputum Cx grew few MRSA (S-tet, bactrim). Sputum AFB (-). A repeat CXR (08-15-10) : Little change in basilar opacities most consistent with pneumonia with probable small effusions.  At last visit continued cough- had repeat CXR (11-14-10): persistent nodular disease.  Last CD4 40 and VL 10,500 (09-12-10).  Prev had sore on her R buttocks, can't sit on. Has had for ~1 week, describes as a boil. No fevers. Was seen in ED and had CXR, and was not given anything (had to leave to get her son). Seen in ID 11-13-10 and had Cx (MRSA) and was started on doxy.  Was supposed to be on DRVr/EMT/ABC but been taking DRV as she does not like to take pills. Has previously take KLT but made her nauseated. Has also previously taken other Rx while in [place].  Today with continued cough, prod of green sputum. No fever or chills. Sore on her bottom has resolved. Has only been taking her ACV, bactrim and Azithro.  Feels like she is ready to start taking her ART again. Having back pain (has hx of scoliosis), having episodes of weakness and knees feeling weak.     Review of Systems  Constitutional: Negative for fever, chills, appetite change and unexpected weight change.    Respiratory: Positive for cough and shortness of breath.   Gastrointestinal: Negative for diarrhea and constipation.  Genitourinary: Negative for dysuria.       Objective:   Physical Exam  Constitutional: She appears well-developed.  HENT:  Mouth/Throat: Oropharynx is clear and moist. No oropharyngeal exudate.  Eyes: EOM are normal. Pupils are equal, round, and reactive to light.  Neck: Neck supple.  Cardiovascular: Normal rate, regular rhythm and normal heart sounds.   Pulmonary/Chest: She has decreased breath sounds. She has no wheezes. She has no rhonchi. She has no rales.  Abdominal: Soft. Bowel sounds are normal. There is no tenderness.  Lymphadenopathy:    She has no cervical adenopathy.          Assessment & Plan:

## 2011-02-07 ENCOUNTER — Telehealth: Payer: Self-pay | Admitting: *Deleted

## 2011-02-07 NOTE — Telephone Encounter (Signed)
Pt HIV medications are all in liquid form at this time.  OI medications unable to obtain in liquid form.

## 2011-02-07 NOTE — Telephone Encounter (Deleted)
Lexiva not currently showing as "active" on the pt med profile.  Is she supposed to be taking this as well?  Pt is on Norvir.  Please advise.

## 2011-02-16 ENCOUNTER — Telehealth: Payer: Self-pay | Admitting: *Deleted

## 2011-02-16 NOTE — Telephone Encounter (Signed)
C/o a lump on her back & scalp sores x 1 week. Wants to be seen asap. Transferred to front to make an appt

## 2011-02-17 ENCOUNTER — Other Ambulatory Visit: Payer: Self-pay | Admitting: Infectious Diseases

## 2011-02-17 ENCOUNTER — Ambulatory Visit (INDEPENDENT_AMBULATORY_CARE_PROVIDER_SITE_OTHER): Payer: Medicare Other | Admitting: Infectious Diseases

## 2011-02-17 ENCOUNTER — Other Ambulatory Visit: Payer: Self-pay | Admitting: *Deleted

## 2011-02-17 ENCOUNTER — Telehealth: Payer: Self-pay | Admitting: *Deleted

## 2011-02-17 ENCOUNTER — Encounter: Payer: Self-pay | Admitting: Infectious Diseases

## 2011-02-17 DIAGNOSIS — Z22322 Carrier or suspected carrier of Methicillin resistant Staphylococcus aureus: Secondary | ICD-10-CM

## 2011-02-17 DIAGNOSIS — B20 Human immunodeficiency virus [HIV] disease: Secondary | ICD-10-CM

## 2011-02-17 DIAGNOSIS — Z79899 Other long term (current) drug therapy: Secondary | ICD-10-CM

## 2011-02-17 DIAGNOSIS — Z113 Encounter for screening for infections with a predominantly sexual mode of transmission: Secondary | ICD-10-CM

## 2011-02-17 DIAGNOSIS — R05 Cough: Secondary | ICD-10-CM

## 2011-02-17 DIAGNOSIS — Z21 Asymptomatic human immunodeficiency virus [HIV] infection status: Secondary | ICD-10-CM

## 2011-02-17 DIAGNOSIS — R110 Nausea: Secondary | ICD-10-CM

## 2011-02-17 DIAGNOSIS — R059 Cough, unspecified: Secondary | ICD-10-CM | POA: Diagnosis not present

## 2011-02-17 LAB — T-HELPER CELL (CD4) - (RCID CLINIC ONLY): CD4 % Helper T Cell: 4 % — ABNORMAL LOW (ref 33–55)

## 2011-02-17 MED ORDER — DOXYCYCLINE HYCLATE 100 MG PO TABS: 100.0000 mg | ORAL_TABLET | Freq: Two times a day (BID) | ORAL | Status: AC

## 2011-02-17 MED ORDER — DRONABINOL 5 MG PO CAPS: 5.0000 mg | ORAL_CAPSULE | Freq: Two times a day (BID) | ORAL | Status: AC

## 2011-02-17 MED ORDER — ONDANSETRON 4 MG PO TBDP: 4.0000 mg | ORAL_TABLET | Freq: Two times a day (BID) | ORAL | Status: DC | PRN

## 2011-02-17 MED ORDER — DRONABINOL 5 MG PO CAPS: 5.0000 mg | ORAL_CAPSULE | Freq: Two times a day (BID) | ORAL | Status: DC

## 2011-02-17 MED ORDER — ONDANSETRON HCL 4 MG PO TABS: 4.0000 mg | ORAL_TABLET | Freq: Two times a day (BID) | ORAL | Status: DC | PRN

## 2011-02-17 NOTE — Assessment & Plan Note (Addendum)
Her vax are up to date. i reiterated to her that if she wants to live she must take her medicine. She needs Paramedic. rtc 2 months. Labs rechecked today. Give her rx for nausea and appetite stim.

## 2011-02-17 NOTE — Progress Notes (Signed)
  Subjective:    Patient ID: Paula Becker, female    DOB: [birth year], 36 y.o.   MRN: [id number]  HPI 36 yo Fwith hx of noncompliance, AIDS (prev on abacavir/epivir/isentress but could not handle the pills, wanted liquids), anemia, and MRSA breast abscess (March 2012). She  was admitted between June 5 and July 11, 2010 for necrotizing pneumonia. She had a CD-4  count of 30 and VL 12,000 (July 05, 2010). She refused bronchoscopy.  She was re-admitted July 30, 2010 with persistent pneumonia. CT scan of the chest showed bilateral upper lobe, left greater than right pneumonic infiltrates with necrotizing pattern and also left lower lobe infiltrate with necrotizing pattern. She was seen by pulmonary but refused bronch. Crypto (-), quantiferon gold indeterminate. Sputum Cx grew few MRSA (S-tet, bactrim). Sputum AFB (-). A repeat CXR (08-15-10) : Little change in basilar opacities most consistent with pneumonia with probable small effusions.  At last visit continued cough- had repeat CXR (11-14-10): persistent nodular disease.  Last CD4 40 and VL 10,500 (09-12-10).  Prev had sore on her R buttocks, can't sit on. Has had for ~1 week, describes as a boil. No fevers. Was seen in ED and had CXR, and was not given anything (had to leave to get her son). Seen in ID 11-13-10 and had Cx (MRSA) and was started on doxy.  Was supposed to be on DRVr/EMT/ABC but been taking DRV as she does not like to take pills. Has previously take KLT but made her nauseated.  Today states she is not feeling "too good". Has had sore on her back for 1 week. Has been draining. Had fever last pm. Has been off ART since last visit. Makes her loose her appetite.      Review of Systems  Constitutional: Positive for fever and appetite change. Negative for chills and unexpected weight change.  Respiratory: Positive for cough. Negative for shortness of breath.   Gastrointestinal: Negative for diarrhea and constipation.  Genitourinary: Negative  for dysuria and menstrual problem.       Objective:   Physical Exam  Constitutional: She appears cachectic.  HENT:  Head: Normocephalic.  Mouth/Throat: No oropharyngeal exudate.  Eyes: EOM are normal. Pupils are equal, round, and reactive to light.  Neck: Neck supple.  Cardiovascular: Normal rate, regular rhythm and normal heart sounds.   Pulmonary/Chest: Effort normal and breath sounds normal. No respiratory distress.  Abdominal: Soft. Bowel sounds are normal. She exhibits no distension. There is no tenderness.  Lymphadenopathy:    She has no cervical adenopathy.  Skin:             Assessment & Plan:

## 2011-02-17 NOTE — Telephone Encounter (Signed)
Referral made to [facility].  Spoke with Bluford Kaufmann, she has had this patient previously.  Office note and referral faxed to [phone]. Wendall Mola CMA

## 2011-02-17 NOTE — Assessment & Plan Note (Addendum)
Will start her on doxy. Some pharamacies are out of this due to national shortage, she is instructed to call us if this is the case.  cx sent.

## 2011-02-17 NOTE — Assessment & Plan Note (Signed)
Will again try to get sputum cx.

## 2011-02-17 NOTE — Progress Notes (Signed)
Addended by: Mariea Clonts D on: 02/17/2011 01:01 PM   Modules accepted: Orders

## 2011-02-18 LAB — COMPREHENSIVE METABOLIC PANEL
ALT: <8 U/L (ref 0–35)
AST: 18 U/L (ref 0–37)
Alkaline Phosphatase: 93 U/L (ref 39–117)
Calcium: 8.8 mg/dL (ref 8.4–10.5)
Chloride: 100 mEq/L (ref 96–112)
Creat: 0.81 mg/dL (ref 0.50–1.10)
Potassium: 4.1 mEq/L (ref 3.5–5.3)

## 2011-02-18 LAB — CBC WITH DIFFERENTIAL/PLATELET
Basophils Absolute: 0 10*3/uL (ref 0.0–0.1)
Basophils Relative: 0 % (ref 0–1)
Eosinophils Relative: 4 % (ref 0–5)
Lymphocytes Relative: 12 % (ref 12–46)
MCHC: 30.2 g/dL (ref 30.0–36.0)
Neutro Abs: 5.2 10*3/uL (ref 1.7–7.7)
Platelets: 349 10*3/uL (ref 150–400)
RDW: 18.2 % — ABNORMAL HIGH (ref 11.5–15.5)
WBC: 6.9 10*3/uL (ref 4.0–10.5)

## 2011-02-18 LAB — LIPID PANEL
HDL: 27 mg/dL — ABNORMAL LOW (ref >39)
LDL Cholesterol: 69 mg/dL (ref 0–99)
Triglycerides: 119 mg/dL (ref <150)
VLDL: 24 mg/dL (ref 0–40)

## 2011-02-20 LAB — WOUND CULTURE: Gram Stain: NONE SEEN

## 2011-02-21 ENCOUNTER — Telehealth: Payer: Self-pay | Admitting: *Deleted

## 2011-02-21 LAB — HIV-1 RNA QUANT-NO REFLEX-BLD: HIV-1 RNA Quant, Log: 4.8 {Log} — ABNORMAL HIGH (ref <1.30)

## 2011-02-21 NOTE — Telephone Encounter (Signed)
Patient called for lab results, and wanted to know if I had spoken to [facility] Provider, Bluford Kaufmann.  Results given and patient said she still had not started taking her medicines.  I spoke with Bjorn Loser and patient had called her, she plans to go out to see Markiah this week. Wendall Mola CMA

## 2011-02-22 ENCOUNTER — Ambulatory Visit: Payer: Self-pay

## 2011-02-22 ENCOUNTER — Ambulatory Visit: Payer: Self-pay | Admitting: Infectious Diseases

## 2011-02-27 ENCOUNTER — Ambulatory Visit: Payer: Self-pay

## 2011-03-06 ENCOUNTER — Telehealth: Payer: Self-pay

## 2011-03-06 ENCOUNTER — Ambulatory Visit: Payer: Medicare Other

## 2011-03-06 NOTE — Telephone Encounter (Signed)
Third time patient has No Showed for ADAP/RW renewal - called Rhonda Rambeaut to let her know. She said she would contact patient.

## 2011-03-15 DIAGNOSIS — Z9114 Patient's other noncompliance with medication regimen: Secondary | ICD-10-CM | POA: Insufficient documentation

## 2011-03-15 DIAGNOSIS — Z91148 Patient's other noncompliance with medication regimen for other reason: Secondary | ICD-10-CM | POA: Insufficient documentation

## 2011-03-15 NOTE — Assessment & Plan Note (Signed)
She is to continue her Bactrim as as prescribed, given the history of MRSA infections, Bactrim would be the best drug of choice at this time.

## 2011-03-15 NOTE — Assessment & Plan Note (Signed)
Is scheduled to see Gen. surgery within the week.

## 2011-03-15 NOTE — Assessment & Plan Note (Signed)
Still demonstrates difficulty with adherence to her medications as a result of a tolerance she has with the medications. Both either inform or taste. We will ask bridge counselor's if a referral to the medication. Adherence nurse might be possible as this is becoming more and more of a problem in her HIV, but continues to progress.

## 2011-03-15 NOTE — Progress Notes (Signed)
Subjective:  Recently discharged from the hospital after treatment for pneumonia, as well as continuing management of her breast abscess. She is currently on antibiotics for which he is to complete an oral regimen. Still having problems with a chronic cough. Having difficulty, taking her antiretrovirals as she cannot tolerate any pill forms, and has marginal. Tolerance for  Review of Systems - General ROS: positive for  - fever, malaise and night sweats Psychological ROS: positive for - anxiety, depression and irritability Ophthalmic ROS: negative for - blurry vision, decreased vision or loss of vision ENT ROS: negative Breast ROS: positive for - continue draining abscess Respiratory ROS: positive for - cough and shortness of breath Cardiovascular ROS: no chest pain or dyspnea on exertion Gastrointestinal ROS: no abdominal pain, change in bowel habits, or black or bloody stools Genito-Urinary ROS: no dysuria, trouble voiding, or hematuria States HSV lesions have healed. Musculoskeletal ROS: negative Neurological ROS: no TIA or stroke symptoms Dermatological ROS: negative   Objective:    General appearance: alert, cooperative and no distress Head: Normocephalic, without obvious abnormality, atraumatic Eyes: conjunctivae/corneas clear. PERRL, EOM's intact. Fundi benign. Ears: normal TM's and external ear canals both ears Throat: lips, mucosa, and tongue normal; teeth and gums normal Resp: rales bibasilar Breasts: Draining abscess left breast. Cardio: regular rate and rhythm, S1, S2 normal, no murmur, click, rub or gallop GI: soft, non-tender; bowel sounds normal; no masses,  no organomegaly Pelvic: external genitalia normal and vagina normal without discharge Extremities: extremities normal, atraumatic, no cyanosis or edema Pulses: 2+ and symmetric Skin: Skin color, texture, turgor normal. No rashes or lesions Neurologic: Alert and oriented X 3, normal strength and tone. Normal  symmetric reflexes. Normal coordination and gait Psych:  No vegetative signs or delusional behaviors noted.     Assessment/Plan:  HSV (herpes simplex virus) anogenital infection HSV lesions have healed. She was instructed that should they recur again she should let us know as soon as possible. Informed her that recurrent HSV lesions would require chronic suppressive therapy for now. She verbally acknowledged this information and agreed with plan of care.  Pneumonia Recently discharged for treatment of pneumonia. States is feeling better, but still has a chronic cough. She is to complete her antibiotics and we will continue to monitor her progress.  AIDS Still with difficulty, taking her medications, demonstrating problems, swallowing, any form of pills and not completely. Adherent to her suspension. Medications. Adherence counseling provided. Referral to her bridge counselor was also made. We should repeat staging labs in the next 2-4 weeks.  Cellulitis and abscess Is scheduled to see Gen. surgery within the week.      Allegra Cerniglia A. Sundra Aland, MS, [facility] [phone]  03/15/2011,  1:00 AM

## 2011-03-15 NOTE — Assessment & Plan Note (Addendum)
Recently discharged for treatment of pneumonia. States is feeling better, but still has a chronic cough. She is to complete her antibiotics and we will continue to monitor her progress.

## 2011-03-15 NOTE — Assessment & Plan Note (Signed)
HSV lesions have healed. She was instructed that should they recur again she should let us know as soon as possible. Informed her that recurrent HSV lesions would require chronic suppressive therapy for now. She verbally acknowledged this information and agreed with plan of care.

## 2011-03-15 NOTE — Assessment & Plan Note (Signed)
Still with difficulty, taking her medications, demonstrating problems, swallowing, any form of pills and not completely. Adherent to her suspension. Medications. Adherence counseling provided. Referral to her bridge counselor was also made. We should repeat staging labs in the next 2-4 weeks.

## 2011-03-15 NOTE — Progress Notes (Signed)
Subjective:    Patient ID: Paula Becker is a 36 y.o. female.  Chief Complaint: HIV Follow-up Visit Paula Becker is here for follow-up of HIV infection. She is feeling worse since her last visit.  She remains poorly adherence to her medications do to her intolerance. There are additional complaints. She still complains of a chronic cough, and now, when she cough. She has a headache, and pain over the left eye. These headaches go way. When she stops coughing. She does not take anything for the cough because she doesn't like the taste of the cough syrup. She states her breast abscess. Seems to be better. She is also complaining of a follicular rash, especially in her chest. She is currently supposed to be taking Bactrim double strength 2 tablets twice a day or its suspension equivalent.  Data Review: Diagnostic studies reviewed.  Review of Systems - General ROS: positive for  - fatigue, malaise and night sweats Psychological ROS: negative Ophthalmic ROS: negative ENT ROS: negative Respiratory ROS: Chronic cough, as described in history of present illness. Cardiovascular ROS: no chest pain or dyspnea on exertion Gastrointestinal ROS: no abdominal pain, change in bowel habits, or black or bloody stools Is having some nausea with her medications Genito-Urinary ROS: no dysuria, trouble voiding, or hematuria No new, genital lesions reported Musculoskeletal ROS: negative Neurological ROS: no TIA or stroke symptoms Dermatological ROS: positive for pruritus and rash  Objective:   General appearance: alert and cooperative Head: Normocephalic, without obvious abnormality, atraumatic Eyes: conjunctivae/corneas clear. PERRL, EOM's intact. Fundi benign. Ears: normal TM's and external ear canals both ears Throat: lips, mucosa, and tongue normal; teeth and gums normal Resp: clear to auscultation bilaterally Cardio: regular rate and rhythm, S1, S2 normal, no murmur, click, rub or gallop GI: soft,  non-tender; bowel sounds normal; no masses,  no organomegaly Pulses: 2+ and symmetric Skin: Follicular rash noted over, chest, and on face Neurologic: Alert and oriented X 3, normal strength and tone. Normal symmetric reflexes. Normal coordination and gait Psych:  No vegetative signs or delusional behaviors noted.    Laboratory: From 09/12/2010 ,  CD4 count was 40 c/cmm @ 3 %. Viral load 10,500 copies/ml.     Assessment/Plan:   Chronic cough Headache and eye pain, are associated with her chronic cough. She refuses to take anything and does not taste well for the cough, which would be helpful, and she took something that may have dextromethorphan in it. We encouraged her to try guaifenesin with dextromethorphan. Once again, if this does not help. It, we may try Tussionex.  AIDS Still demonstrates difficulty with adherence to her medications as a result of a tolerance she has with the medications. Both either inform or taste. We will ask bridge counselor's if a referral to the medication. Adherence nurse might be possible as this is becoming more and more of a problem in her HIV, but continues to progress.  Folliculitis She is to continue her Bactrim as as prescribed, given the history of MRSA infections, Bactrim would be the best drug of choice at this time.      Paula Becker A. Sundra Aland, MS, [facility] [phone]  03/15/2011, 1:26 AM

## 2011-03-15 NOTE — Assessment & Plan Note (Addendum)
Headache and eye pain, are associated with her chronic cough. She refuses to take anything and does not taste well for the cough, which would be helpful, and she took something that may have dextromethorphan in it. We encouraged her to try guaifenesin with dextromethorphan. Once again, if this does not help. It, we may try Tussionex.

## 2011-03-22 ENCOUNTER — Telehealth: Payer: Self-pay | Admitting: *Deleted

## 2011-03-22 NOTE — Telephone Encounter (Signed)
Paula Becker with [facility] called, on visits to patient's home it appears she is only taking her liquid HIV meds sporadically. At the end of a month she still had 1/2 the medications remaining in her bottles. She has a follow up appointment 04/19/11, but no lab appointment.  Will let Dr. Ninetta Lights know she is not compliant with her meds. Wendall Mola CMA

## 2011-03-23 ENCOUNTER — Other Ambulatory Visit: Payer: Self-pay | Admitting: Infectious Diseases

## 2011-03-23 DIAGNOSIS — B20 Human immunodeficiency virus [HIV] disease: Secondary | ICD-10-CM

## 2011-03-24 ENCOUNTER — Ambulatory Visit: Payer: Medicare Other

## 2011-04-11 ENCOUNTER — Telehealth: Payer: Self-pay | Admitting: *Deleted

## 2011-04-11 NOTE — Telephone Encounter (Signed)
Received call from [facility] with [facility], he went out to see if patient needed case management.  All she wanted to talk about was her financial difficulties and does not want to start taking her HIV medicines.  At this time there is nothing that [facility] can offer until she is willing to take her medications. Wendall Mola CMA

## 2011-04-19 ENCOUNTER — Ambulatory Visit (INDEPENDENT_AMBULATORY_CARE_PROVIDER_SITE_OTHER): Payer: Medicare Other | Admitting: Infectious Diseases

## 2011-04-19 VITALS — BP 99/65 | HR 112 | Ht 69.0 in | Wt 118.8 lb

## 2011-04-19 DIAGNOSIS — A609 Anogenital herpesviral infection, unspecified: Secondary | ICD-10-CM

## 2011-04-19 DIAGNOSIS — B20 Human immunodeficiency virus [HIV] disease: Secondary | ICD-10-CM

## 2011-04-19 DIAGNOSIS — B009 Herpesviral infection, unspecified: Secondary | ICD-10-CM

## 2011-04-19 MED ORDER — VALACYCLOVIR HCL 1 G PO TABS: 1000.0000 mg | ORAL_TABLET | Freq: Three times a day (TID) | ORAL | Status: DC

## 2011-04-19 NOTE — Progress Notes (Signed)
  Subjective:    Patient ID: Paula Becker, female    DOB: May 30, 1975, 36 y.o.   MRN: [id number]  HPI 36 yo Fwith hx of noncompliance, AIDS (prev on abacavir/epivir/isentress but could not handle the pills, wanted liquids), anemia, and recurrent  MRSA infections, necrotizing pneumonia. Has had difficulty taking her ART (DRVr/EMT/ABC). Has previously take KLT but made her nauseated.  Was seen in f/u January 2013 and was given doxy for a furuncle on her back. Was also given marinol and zofran for appetite and nausea (due to art).  Her back wound has healed. Now has "another little nasty thing back there, and it hurts". On upper gluteus.  Has not been taking ART well- moved and has not been taking her ART.  Continues to have cough productive of green mucous.  Has occas episodes of nausea. Zofran helps sometimes.  HIV 1 RNA Quant (copies/mL)  Date Value  02/17/2011 63724*  09/12/2010 10500*  07/05/2010 12000*     CD4 T Cell Abs (cmm)  Date Value  02/17/2011 30*  09/12/2010 40*  07/05/2010 30*      Review of Systems  Constitutional: Negative for fever, chills, appetite change and unexpected weight change.  Gastrointestinal: Negative for diarrhea and constipation.  Genitourinary: Negative for dysuria and menstrual problem.       Objective:   Physical Exam  Constitutional: No distress.  HENT:  Mouth/Throat: No oropharyngeal exudate.  Eyes: EOM are normal. Pupils are equal, round, and reactive to light.  Neck: Neck supple.  Cardiovascular: Normal rate, regular rhythm and normal heart sounds.   Pulmonary/Chest: Effort normal and breath sounds normal.  Abdominal: Soft. Bowel sounds are normal. She exhibits no distension. There is no tenderness.  Lymphadenopathy:    She has no cervical adenopathy.  Skin: She is not diaphoretic.             Assessment & Plan:

## 2011-04-19 NOTE — Assessment & Plan Note (Addendum)
Encouraged her to take ART, zofran. She is offered condoms, refuses. PNVX and flu shot up to date. She should probably be on OI prophylaxis but won't take her ART as it is , so will not overload her with more pills (which she is already not taking). Get her PAP smear, bridge counselor.  rtc 3 months

## 2011-04-19 NOTE — Assessment & Plan Note (Signed)
No t sure if this is HSV or shingles. Will give her a rx for valtrex. Advised her to take tyelnol or advil for pain.

## 2011-04-20 ENCOUNTER — Telehealth: Payer: Self-pay | Admitting: *Deleted

## 2011-04-20 NOTE — Telephone Encounter (Signed)
He returned my call . I wanted him to see this pt again for help with med compliance. He discussed his attempts to help her. She refused care unless he could bring her food she wanted & gave her money for rent. Will let md know this

## 2011-05-01 ENCOUNTER — Encounter: Payer: Self-pay | Admitting: *Deleted

## 2011-05-18 ENCOUNTER — Encounter: Payer: Self-pay | Admitting: Internal Medicine

## 2011-05-18 ENCOUNTER — Telehealth: Payer: Self-pay | Admitting: Licensed Clinical Social Worker

## 2011-05-18 ENCOUNTER — Ambulatory Visit
Admission: RE | Admit: 2011-05-18 | Discharge: 2011-05-18 | Disposition: A | Discharge status: Home / Self Care | Payer: Medicare Other | Source: Ambulatory Visit | Attending: Internal Medicine | Admitting: Internal Medicine

## 2011-05-18 ENCOUNTER — Ambulatory Visit (INDEPENDENT_AMBULATORY_CARE_PROVIDER_SITE_OTHER): Payer: Medicare Other | Admitting: Internal Medicine

## 2011-05-18 VITALS — BP 100/69 | HR 106 | Temp 98.7°F | Wt 118.2 lb

## 2011-05-18 DIAGNOSIS — R05 Cough: Secondary | ICD-10-CM

## 2011-05-18 DIAGNOSIS — B20 Human immunodeficiency virus [HIV] disease: Secondary | ICD-10-CM

## 2011-05-18 DIAGNOSIS — R059 Cough, unspecified: Secondary | ICD-10-CM | POA: Diagnosis not present

## 2011-05-18 DIAGNOSIS — J90 Pleural effusion, not elsewhere classified: Secondary | ICD-10-CM | POA: Diagnosis not present

## 2011-05-18 DIAGNOSIS — R079 Chest pain, unspecified: Secondary | ICD-10-CM | POA: Diagnosis not present

## 2011-05-18 NOTE — Progress Notes (Signed)
  Subjective:    Patient ID: Paula Becker, female    DOB: 05/21/1975, 36 y.o.   MRN: [id number]  HPI She comes in here today for a sick visit. She tells me she has had a dry cough and pleuritic chest pain that occurs with coughing for about one month. She denies fever, endorses chills and has had a poor appetite. She denies sick contacts. She has been off her antiretroviral therapy for several months due to the lack of desire to take them. She also has lost weight. She denies diarrhea, joint pain, rashes.   Review of Systems  Constitutional: Positive for chills, activity change, appetite change and unexpected weight change. Negative for fever and fatigue.  HENT: Negative for sore throat and trouble swallowing.   Respiratory: Positive for cough and chest tightness. Negative for shortness of breath and wheezing.   Cardiovascular: Positive for chest pain. Negative for palpitations and leg swelling.       Pleuritic with cough  Gastrointestinal: Positive for nausea. Negative for abdominal pain, diarrhea and constipation.  Musculoskeletal: Negative for myalgias, joint swelling and arthralgias.  Skin: Negative for pallor and rash.  Hematological: Negative for adenopathy.  Psychiatric/Behavioral: Negative for dysphoric mood. The patient is not nervous/anxious.        Objective:   Physical Exam  Constitutional:       Very thin appearing, wasted  HENT:  Mouth/Throat: Oropharynx is clear and moist. No oropharyngeal exudate.  Cardiovascular: Normal rate, regular rhythm and normal heart sounds.  Exam reveals no gallop and no friction rub.   No murmur heard. Pulmonary/Chest: Effort normal and breath sounds normal. No respiratory distress. She has no wheezes. She has no rales.  Abdominal: Soft. Bowel sounds are normal. She exhibits no distension. There is no tenderness.       Thin   Lymphadenopathy:    She has cervical adenopathy.          Assessment & Plan:

## 2011-05-18 NOTE — Assessment & Plan Note (Signed)
She was encouraged to take her antiretroviral therapy again. She does tell me she is going to start. I have put in followup labs for her to get if she does start her therapy and she has been scheduled for followup regarding those labs with her primary provider.

## 2011-05-18 NOTE — Telephone Encounter (Signed)
Patient called states she has been off medications for 2 months and now having chills, eye pain, back pain, and chest pain that just started. She states that her chest pain is not heart related it feels more like muscular aches. I scheduled patient an appointment today to see Dr. Luciana Axe.

## 2011-05-18 NOTE — Patient Instructions (Signed)
You will be called for any abnormal results

## 2011-05-18 NOTE — Assessment & Plan Note (Addendum)
She continues to have problems with cough and tells me this has been over the last month. Certainly, with her poor compliance the differential is broad. However she is not hypoxic or has any specific localizing symptoms other than the cough. I will check a chest x-ray. She does also complain of some back pain and we'll check a urinalysis. I also did explain to her that most likely this is related to poor control of her HIV. She will be called for any abnormal results.

## 2011-05-22 ENCOUNTER — Telehealth: Payer: Self-pay | Admitting: Internal Medicine

## 2011-05-22 ENCOUNTER — Other Ambulatory Visit: Payer: Self-pay | Admitting: *Deleted

## 2011-05-22 DIAGNOSIS — B20 Human immunodeficiency virus [HIV] disease: Secondary | ICD-10-CM

## 2011-05-22 MED ORDER — ABACAVIR SULFATE 20 MG/ML PO SOLN
600.0000 mg | Freq: Every day | ORAL | Status: DC
Start: 2011-05-22 — End: 2011-08-20

## 2011-05-22 MED ORDER — SULFAMETHOXAZOLE-TMP DS 800-160 MG PO TABS: 2.0000 | ORAL_TABLET | Freq: Three times a day (TID) | ORAL | Status: AC

## 2011-05-22 MED ORDER — RITONAVIR 80 MG/ML PO SOLN: 100.0000 mg | Freq: Every day | ORAL | Status: DC

## 2011-05-22 MED ORDER — EMTRICITABINE 10 MG/ML PO SOLN: 200.0000 mg | Freq: Every day | ORAL | Status: DC

## 2011-05-22 NOTE — Telephone Encounter (Signed)
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  Will have her start therapy.

## 2011-05-22 NOTE — Telephone Encounter (Signed)
I went over the dosing of the bactrim with her. She is going to crush it & mix with applesauce or pudding to take it. States she has been out of her HIV meds for "a while" I refilled them. She showed 12 refills on her meds. She just needed to call the pharmacy.   I spoke with Sharol Roussel about her med management. She had been with Bjorn Loser with Case Management. She had fired Rhonda-just wanted fast food & money. Leonor Liv (Rhonda's replacement) had tried to help last month. She did not want help at this time. He agreed to try again. Faxed this note to him at [phone] at his request.

## 2011-05-23 ENCOUNTER — Ambulatory Visit: Payer: Medicare Other | Admitting: Internal Medicine

## 2011-06-08 ENCOUNTER — Telehealth: Payer: Self-pay | Admitting: Licensed Clinical Social Worker

## 2011-06-08 NOTE — Telephone Encounter (Signed)
Patient called stating that her eye was hurting and she had a brown spot that appeared recently.  She had an appointment recently but forgot to mention this to Dr. Luciana Axe. She states that she did not have insurance or money to go to an eye doctor. I offered her an appointment for 06/27/2011 she refused stating she couldn't wait that long. She called back and spoke with Kennon Rounds, RN and was told that she does have Medicare and she could call around to find an eye doctor or urgent care  to see and she would need to take her Medicare card to the appointment.

## 2011-06-09 ENCOUNTER — Telehealth: Payer: Self-pay

## 2011-06-09 NOTE — Telephone Encounter (Signed)
I called Paula Becker to inform her of appointment and she requested it be cancelled. Pt stated she went to Urgent Care for this problem and no longer needs appointment.  Laurell Josephs, RN

## 2011-06-16 ENCOUNTER — Other Ambulatory Visit: Payer: Medicare Other

## 2011-06-16 ENCOUNTER — Telehealth: Payer: Self-pay | Admitting: Licensed Clinical Social Worker

## 2011-06-16 NOTE — Telephone Encounter (Signed)
Patient called stating she is throwing up a few hours  after taking her medications and did not make her lab appointment today because it is too soon after starting medications 2 weeks ago. I cancelled her lab appointment and rescheduled her for 5/30 to discuss with the doctor about her "eye concerns". She states that they hurt and have styes on them often. I advised her to put warm compress on it when it happens. She can also do labs this day.

## 2011-06-19 ENCOUNTER — Other Ambulatory Visit: Payer: Self-pay | Admitting: *Deleted

## 2011-06-19 DIAGNOSIS — B20 Human immunodeficiency virus [HIV] disease: Secondary | ICD-10-CM

## 2011-06-19 MED ORDER — SULFAMETHOXAZOLE-TMP DS 800-160 MG PO TABS: 1.0000 | ORAL_TABLET | Freq: Every day | ORAL | Status: DC

## 2011-06-23 ENCOUNTER — Emergency Department (HOSPITAL_COMMUNITY): Payer: Medicare Other

## 2011-06-23 ENCOUNTER — Inpatient Hospital Stay (HOSPITAL_COMMUNITY)
Admission: EM | Admit: 2011-06-23 | Discharge: 2011-06-28 | DRG: 974 | Discharge status: Against Medical Advice | Payer: Medicare Other | Attending: Internal Medicine | Admitting: Internal Medicine

## 2011-06-23 ENCOUNTER — Encounter (HOSPITAL_COMMUNITY): Payer: Self-pay | Admitting: *Deleted

## 2011-06-23 DIAGNOSIS — B370 Candidal stomatitis: Secondary | ICD-10-CM | POA: Diagnosis present

## 2011-06-23 DIAGNOSIS — A4189 Other specified sepsis: Secondary | ICD-10-CM | POA: Diagnosis present

## 2011-06-23 DIAGNOSIS — B20 Human immunodeficiency virus [HIV] disease: Principal | ICD-10-CM

## 2011-06-23 DIAGNOSIS — Z9119 Patient's noncompliance with other medical treatment and regimen: Secondary | ICD-10-CM

## 2011-06-23 DIAGNOSIS — J189 Pneumonia, unspecified organism: Secondary | ICD-10-CM

## 2011-06-23 DIAGNOSIS — Z8614 Personal history of Methicillin resistant Staphylococcus aureus infection: Secondary | ICD-10-CM

## 2011-06-23 DIAGNOSIS — A419 Sepsis, unspecified organism: Secondary | ICD-10-CM | POA: Diagnosis present

## 2011-06-23 DIAGNOSIS — L8990 Pressure ulcer of unspecified site, unspecified stage: Secondary | ICD-10-CM | POA: Insufficient documentation

## 2011-06-23 DIAGNOSIS — R918 Other nonspecific abnormal finding of lung field: Secondary | ICD-10-CM

## 2011-06-23 DIAGNOSIS — B59 Pneumocystosis: Secondary | ICD-10-CM | POA: Diagnosis present

## 2011-06-23 DIAGNOSIS — Z91199 Patient's noncompliance with other medical treatment and regimen due to unspecified reason: Secondary | ICD-10-CM

## 2011-06-23 DIAGNOSIS — D638 Anemia in other chronic diseases classified elsewhere: Secondary | ICD-10-CM | POA: Diagnosis present

## 2011-06-23 DIAGNOSIS — Z8249 Family history of ischemic heart disease and other diseases of the circulatory system: Secondary | ICD-10-CM

## 2011-06-23 DIAGNOSIS — Z881 Allergy status to other antibiotic agents status: Secondary | ICD-10-CM

## 2011-06-23 DIAGNOSIS — Q339 Congenital malformation of lung, unspecified: Secondary | ICD-10-CM | POA: Diagnosis not present

## 2011-06-23 DIAGNOSIS — J479 Bronchiectasis, uncomplicated: Secondary | ICD-10-CM | POA: Diagnosis not present

## 2011-06-23 DIAGNOSIS — Z21 Asymptomatic human immunodeficiency virus [HIV] infection status: Secondary | ICD-10-CM | POA: Diagnosis not present

## 2011-06-23 DIAGNOSIS — R0602 Shortness of breath: Secondary | ICD-10-CM | POA: Diagnosis not present

## 2011-06-23 DIAGNOSIS — J90 Pleural effusion, not elsewhere classified: Secondary | ICD-10-CM | POA: Diagnosis not present

## 2011-06-23 DIAGNOSIS — J984 Other disorders of lung: Secondary | ICD-10-CM | POA: Diagnosis not present

## 2011-06-23 DIAGNOSIS — R079 Chest pain, unspecified: Secondary | ICD-10-CM | POA: Diagnosis not present

## 2011-06-23 DIAGNOSIS — J209 Acute bronchitis, unspecified: Secondary | ICD-10-CM | POA: Diagnosis not present

## 2011-06-23 DIAGNOSIS — A150 Tuberculosis of lung: Secondary | ICD-10-CM | POA: Diagnosis not present

## 2011-06-23 HISTORY — DX: Gangrene and necrosis of lung: J85.0

## 2011-06-23 HISTORY — DX: Patient's noncompliance with other medical treatment and regimen: Z91.19

## 2011-06-23 HISTORY — DX: Pneumonia, unspecified organism: J18.9

## 2011-06-23 HISTORY — DX: Patient's noncompliance with other medical treatment and regimen due to unspecified reason: Z91.199

## 2011-06-23 LAB — BASIC METABOLIC PANEL
CO2: 26 mEq/L (ref 19–32)
Chloride: 96 mEq/L (ref 96–112)
Glucose, Bld: 108 mg/dL — ABNORMAL HIGH (ref 70–99)
Potassium: 4.4 mEq/L (ref 3.5–5.1)
Sodium: 136 mEq/L (ref 135–145)

## 2011-06-23 LAB — HEPATIC FUNCTION PANEL
ALT: 11 U/L (ref 0–35)
AST: 26 U/L (ref 0–37)
Albumin: 2.2 g/dL — ABNORMAL LOW (ref 3.5–5.2)
Alkaline Phosphatase: 117 U/L (ref 39–117)
Bilirubin, Direct: <0.1 mg/dL (ref 0.0–0.3)
Total Protein: 8 g/dL (ref 6.0–8.3)

## 2011-06-23 LAB — CBC
Hemoglobin: 8.3 g/dL — ABNORMAL LOW (ref 12.0–15.0)
MCH: 22.3 pg — ABNORMAL LOW (ref 26.0–34.0)
Platelets: 400 10*3/uL (ref 150–400)
RBC: 3.73 MIL/uL — ABNORMAL LOW (ref 3.87–5.11)
WBC: 9.9 10*3/uL (ref 4.0–10.5)

## 2011-06-23 LAB — LACTIC ACID, PLASMA: Lactic Acid, Venous: 0.9 mmol/L (ref 0.5–2.2)

## 2011-06-23 LAB — POCT I-STAT TROPONIN I: Troponin i, poc: 0 ng/mL (ref 0.00–0.08)

## 2011-06-23 LAB — MRSA PCR SCREENING: MRSA by PCR: POSITIVE — AB

## 2011-06-23 MED ORDER — ENOXAPARIN SODIUM 40 MG/0.4ML ~~LOC~~ SOLN
40.0000 mg | SUBCUTANEOUS | Status: DC
  Administered 2011-06-23: 40 mg via SUBCUTANEOUS
  Filled 2011-06-23 (×7): qty 0.4

## 2011-06-23 MED ORDER — ABACAVIR SULFATE 20 MG/ML PO SOLN
600.0000 mg | Freq: Every day | ORAL | Status: DC
  Filled 2011-06-23 (×2): qty 30

## 2011-06-23 MED ORDER — ACETAMINOPHEN 325 MG PO TABS
650.0000 mg | ORAL_TABLET | Freq: Four times a day (QID) | ORAL | Status: DC | PRN
  Administered 2011-06-24: 650 mg via ORAL
  Filled 2011-06-23 (×2): qty 2

## 2011-06-23 MED ORDER — ONDANSETRON HCL 4 MG PO TABS: 4.0000 mg | ORAL_TABLET | Freq: Four times a day (QID) | ORAL | Status: DC | PRN

## 2011-06-23 MED ORDER — SODIUM CHLORIDE 0.9 % IV BOLUS (SEPSIS)
1000.0000 mL | Freq: Once | INTRAVENOUS | Status: AC
  Administered 2011-06-23: 1000 mL via INTRAVENOUS

## 2011-06-23 MED ORDER — SULFAMETHOXAZOLE-TMP DS 800-160 MG PO TABS
2.0000 | ORAL_TABLET | Freq: Once | ORAL | Status: DC
  Filled 2011-06-23: qty 2

## 2011-06-23 MED ORDER — MUPIROCIN 2 % EX OINT
1.0000 "application " | TOPICAL_OINTMENT | Freq: Two times a day (BID) | CUTANEOUS | Status: DC
  Administered 2011-06-23 – 2011-06-27 (×6): 1 via NASAL
  Filled 2011-06-23: qty 22

## 2011-06-23 MED ORDER — DEXTROSE 5 % IV SOLN
500.0000 mg | INTRAVENOUS | Status: DC
  Administered 2011-06-24 – 2011-06-28 (×5): 500 mg via INTRAVENOUS
  Filled 2011-06-23 (×6): qty 500

## 2011-06-23 MED ORDER — DEXTROSE 5 % IV SOLN
1.0000 g | Freq: Once | INTRAVENOUS | Status: AC
  Administered 2011-06-23: 1 g via INTRAVENOUS
  Filled 2011-06-23: qty 10

## 2011-06-23 MED ORDER — CHLORHEXIDINE GLUCONATE CLOTH 2 % EX PADS
6.0000 | MEDICATED_PAD | Freq: Every day | CUTANEOUS | Status: DC
  Administered 2011-06-24: 6 via TOPICAL

## 2011-06-23 MED ORDER — DEXTROSE 5 % IV SOLN
500.0000 mg | Freq: Once | INTRAVENOUS | Status: AC
  Administered 2011-06-23: 500 mg via INTRAVENOUS
  Filled 2011-06-23: qty 500

## 2011-06-23 MED ORDER — SULFAMETHOXAZOLE-TRIMETHOPRIM 200-40 MG/5ML PO SUSP
40.0000 mL | Freq: Three times a day (TID) | ORAL | Status: DC
  Administered 2011-06-23: 40 mL via ORAL
  Filled 2011-06-23 (×12): qty 40

## 2011-06-23 MED ORDER — DARUNAVIR ETHANOLATE 800 MG PO TABS
800.0000 mg | ORAL_TABLET | Freq: Every day | ORAL | Status: DC
  Filled 2011-06-23 (×7): qty 1

## 2011-06-23 MED ORDER — RITONAVIR 80 MG/ML PO SOLN
104.0000 mg | Freq: Every day | ORAL | Status: DC
  Filled 2011-06-23 (×10): qty 1.3

## 2011-06-23 MED ORDER — EMTRICITABINE 10 MG/ML PO SOLN
200.0000 mg | Freq: Every day | ORAL | Status: DC
  Filled 2011-06-23: qty 20

## 2011-06-23 MED ORDER — ACETAMINOPHEN 500 MG PO TABS
1000.0000 mg | ORAL_TABLET | Freq: Once | ORAL | Status: AC
  Administered 2011-06-23: 1000 mg via ORAL
  Filled 2011-06-23: qty 2

## 2011-06-23 MED ORDER — DEXTROSE 5 % IV SOLN
1.0000 g | INTRAVENOUS | Status: DC
  Administered 2011-06-24 – 2011-06-28 (×5): 1 g via INTRAVENOUS
  Filled 2011-06-23 (×6): qty 10

## 2011-06-23 MED ORDER — SODIUM CHLORIDE 0.9 % IV SOLN
INTRAVENOUS | Status: DC
  Administered 2011-06-23: 19:00:00 via INTRAVENOUS
  Administered 2011-06-24 – 2011-06-25 (×2): 1000 mL via INTRAVENOUS
  Administered 2011-06-25 – 2011-06-28 (×4): via INTRAVENOUS

## 2011-06-23 MED ORDER — ACETAMINOPHEN 650 MG RE SUPP: 650.0000 mg | Freq: Four times a day (QID) | RECTAL | Status: DC | PRN

## 2011-06-23 MED ORDER — ONDANSETRON HCL 4 MG/2ML IJ SOLN
4.0000 mg | Freq: Four times a day (QID) | INTRAMUSCULAR | Status: DC | PRN
  Filled 2011-06-23: qty 2

## 2011-06-23 NOTE — ED Notes (Signed)
Pt received to POD 9 with c/o chest pain, cough and fever x a couple of days. Pt claimed that she has been coughing greenish phlegm. Pt is A/A/Ox4, skin is warm and dry, respiration is even and unlabored.

## 2011-06-23 NOTE — ED Provider Notes (Signed)
History     CSN: [id number]  Arrival date & time 06/23/11  7829   First MD Initiated Contact with Patient 06/23/11 1044      Chief Complaint  Patient presents with  . Chest Pain    (Consider location/radiation/quality/duration/timing/severity/associated sxs/prior treatment) HPI  Patient who is HIV positive who is followed by Dr. Ninetta Lights with ID presents to ER complaining of a 2 day hx of increasing cough but states a one to two month hx of ongoing cough. Patient has been non compliant with not only antivirals but follow up appointments and lab appointments and therefore we have no recent CD4 count or viral load. She complains of chest discomfort stating "I cough so much my chest hurts." states she have felt feverish and chilled but no recorded temp. She has taken no medication for her symptoms PTA. Patient states she is only taking anti-virals currently. Denies SOB or respiratory difficulty.   Past Medical History  Diagnosis Date  . HIV (human immunodeficiency virus infection)   . MRSA (methicillin resistant staph aureus) culture positive   . PNA (pneumonia)     Past Surgical History  Procedure Date  . Breast surgery     right  . Cesarean section     No family history on file.  History  Substance Use Topics  . Smoking status: Never Smoker   . Smokeless tobacco: Never Used  . Alcohol Use: No    OB History    Grav Para Term Preterm Abortions TAB SAB Ect Mult Living                  Review of Systems  All other systems reviewed and are negative.    Allergies  Shellfish-derived products and Vancomycin  Home Medications   Current Outpatient Rx  Name Route Sig Dispense Refill  . ABACAVIR SULFATE 20 MG/ML PO SOLN Oral Take 30 mLs (600 mg total) by mouth daily. 240 mL 2  . EMTRICITABINE 10 MG/ML PO SOLN Oral Take 20 mLs (200 mg total) by mouth daily. 600 mL 2  . RITONAVIR 80 MG/ML PO SOLN Oral Take 1.3 mLs (104 mg total) by mouth daily. 240 mL 2    BP 105/59   Pulse 115  Temp(Src) 100.5 F (38.1 C) (Oral)  Resp 22  Ht 5' 9.5" (1.765 m)  Wt 118 lb (53.524 kg)  BMI 17.18 kg/m2  SpO2 97%  LMP 05/31/2011  Physical Exam  Nursing note and vitals reviewed. Constitutional: She is oriented to person, place, and time. She appears well-developed and well-nourished. No distress.  HENT:  Head: Normocephalic and atraumatic.  Eyes: Conjunctivae are normal.  Neck: Normal range of motion. Neck supple.  Cardiovascular: Regular rhythm, normal heart sounds and intact distal pulses.  Exam reveals no gallop and no friction rub.   No murmur heard.      tachycardia  Pulmonary/Chest: Effort normal. No respiratory distress. She has no wheezes. She has rales. She exhibits no tenderness.       diffuse rales   Abdominal: Soft. Bowel sounds are normal. She exhibits no distension and no mass. There is no tenderness. There is no rebound and no guarding.  Musculoskeletal: Normal range of motion. She exhibits no edema and no tenderness.  Neurological: She is alert and oriented to person, place, and time.  Skin: Skin is warm and dry. No rash noted. She is not diaphoretic. No erythema.  Psychiatric: She has a normal mood and affect.    ED Course  Procedures (  including critical care time)  IV fluids  Blood cultures drawn. Will consult ID for abx choice given HIV and concern recently for PCP PNA and allergy to vanc  Labs Reviewed  CBC - Abnormal; Notable for the following:    RBC 3.73 (*)    Hemoglobin 8.3 (*)    HCT 27.1 (*)    MCV 72.7 (*)    MCH 22.3 (*)    RDW 16.2 (*)    All other components within normal limits  BASIC METABOLIC PANEL - Abnormal; Notable for the following:    Glucose, Bld 108 (*)    GFR calc non Af Amer 74 (*)    GFR calc Af Amer 85 (*)    All other components within normal limits  POCT I-STAT TROPONIN I  CULTURE, BLOOD (ROUTINE X 2)  CULTURE, BLOOD (ROUTINE X 2)  LACTIC ACID, PLASMA   Dg Chest 2 View  06/23/2011  *RADIOLOGY REPORT*   Clinical Data: Back pain, chest pain and cough.  CHEST - 2 VIEW  Comparison: 06/13/2011  Findings: Two views of the chest demonstrates increased patchy densities in the right lower lung.  There are persistent patchy nodular densities in the left lung.  There appears to be chronic bronchiectasis and volume loss involving the right middle lobe. Negative for a pneumothorax.   Heart size is within normal limits.  IMPRESSION: Increased patchy and nodular opacities in the right lower lung. Findings are most compatible with an infectious etiology.  Chronic or persistent patchy disease in the left lung.  Chronic volume loss in the right middle lobe.  Original Report Authenticated By: Richarda Overlie, M.D.     1. CAP (community acquired pneumonia)   2. PCP (pneumocystis carinii pneumonia)   3. HIV (human immunodeficiency virus infection)     11:48 AM Dr. Ninetta Lights is requesting admission to [facility] and starting patient on ceftriaxone and zithromax to cover for CAP and 2DS bactrim q 8 hours to cover for PCP PNA.   MDM  OPC to admit. Stable and non toxic appearing but will continue to monitor.         [place], [place] 06/23/11 [phone]

## 2011-06-23 NOTE — Progress Notes (Signed)
Patient refused all Lovenox injections. She stated that she doesn't want them at all. Patient also stated that she only take oral suspension for her viral meds. Pharmacy doesn't care the oral suspension forms only pill form. She stated that someone from home will bring her medication in the morning. Patient has not had her 6pm dose of viral medications.

## 2011-06-23 NOTE — H&P (Signed)
Hospital Admission Note Date: 06/24/2011  Patient name: Paula Becker Medical record number: [id number] Date of birth: 1975-03-27 Age: 36 y.o. Gender: female PCP: No primary provider on file.  Medical Service: Internal Medicine Teaching Service  Attending physician: Dr. Blanch Media    1st Contact: Dr. Dierdre Searles               Pager: [phone] 2nd Contact: Dr. Allena Katz    Pager:[phone] After 5 pm or weekends: 1st Contact:      Pager: [phone] 2nd Contact:      Pager: [phone]  Chief Complaint: Cough and shortness of breath  History of Present Illness: This is a 36 year old woman with past medical history significant for poorly controlled HIV/AIDS (last CD4 30 and VL 63724 on 02/17/11), anemia of chronic disease, recurrent skin folliculitis/abscess and medical noncompliance, who presented with chronic cough for 2 month and shortness of breath for 2 weeks. Patient states that she started to have productive cough with moderate amount greenish sputum(average one-cup full daily) 2 months ago.  She felt hot/chills with intermittent night sweating.  She was not sure whether she had a fever or not because she did not take her temperature.  She also reports intermittent mid-sternal and right chest pleuritic-like sharp pain associated with cough.  Her chest pain was moderate to severe 8/10 without radiation.  Cough makes it worse and resting makes it better.  She denies sick contact, long distance travel or [place] travel recently.  She did not seek any medical attention until mid April when she went to see Dr. Luciana Axe at the ID clinic.  Her chest x-ray done at the clinic showed " persistent bilateral nodular airspace opacities could be due to pneumocystis pneumonia".  She was given Bactrim for treatment of PCP pneumonia by Dr. Luciana Axe.  However, she did not take the antibiotics due to lack of desire.  She continues to have cough with greenish sputum and started to have shortness of breath 2 weeks ago.  She  came to ED today for further evaluation  Of note, she admits that she has been off her anti-retroviral therapy for several months or a year due to the lack of desire to take them.  She states that she has started to take her ART medications for a couple weeks ago. Denies appetite change or weight loss.  Meds: Medications Prior to Admission  Medication Sig Dispense Refill  . abacavir (ZIAGEN) 20 MG/ML solution Take 30 mLs (600 mg total) by mouth daily.  240 mL  2  . emtricitabine (EMTRIVA) 10 MG/ML solution Take 20 mLs (200 mg total) by mouth daily.  600 mL  2  . ritonavir (NORVIR) 80 MG/ML solution Take 1.3 mLs (104 mg total) by mouth daily.  240 mL  2    Allergies: Allergies as of 06/23/2011 - Review Complete 06/23/2011  Allergen Reaction Noted  . Shellfish-derived products Swelling 06/09/2010  . Vancomycin Rash 04/01/2010   Past Medical History  Diagnosis Date  . HIV (human immunodeficiency virus infection)   . MRSA (methicillin resistant staph aureus) culture positive   .  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 Cesarean section 1998  . Breast surgery 03/2010    right; "don't know what they did"   Family History  Problem Relation Age of Onset  . Lung disease Mother     passed away at 6 with lung disease  .  Hypertension Father    History   Social History  . Marital Status: Single    Spouse Name: N/A    Number of Children: N/A  . Years of Education: N/A   Occupational History  . Not on file.   Social History Main Topics  . Smoking status: Never Smoker   . Smokeless tobacco: Never Used  . Alcohol Use: No  . Drug Use: No  . Sexually Active: No     declined condoms   Other Topics Concern  . Not on file   Social History Narrative   Lives in [place] in her house.Has support from her Dad for doctor visits.Didn't work ever. Not working now.    Review of  Systems: Review of Systems:  Constitutional:  Denies fever, appetite change and fatigue.  Positive for chills and night sweating   HEENT:  Denies congestion, sore throat, rhinorrhea, sneezing, mouth sores, trouble swallowing, neck pain   Respiratory:   positive SOB, DOE, cough.   Cardiovascular:  Denies palpitations and leg swelling.   Gastrointestinal:  Denies nausea, vomiting, abdominal pain, diarrhea, constipation, blood in stool and abdominal distention.   Genitourinary:  Denies dysuria, urgency, frequency, hematuria, flank pain and difficulty urinating.   Musculoskeletal:  Denies myalgias, back pain, joint swelling, arthralgias and gait problem.   Skin:  Denies pallor, rash and wound.   Neurological:  Denies dizziness, seizures, syncope, weakness, light-headedness, numbness and headaches.    .    Physical Exam: Blood pressure 117/72, pulse 112, temperature 101.2 F (38.4 C), temperature source Oral, resp. rate 22, height 5' 9.5" (1.765 m), weight 113 lb 3 oz (51.342 kg), last menstrual period 05/31/2011, SpO2 94.00%. General: Very thin appearing, wasted  Head: normocephalic and atraumatic.  Eyes: vision grossly intact, pupils equal, pupils round, pupils reactive to light, no injection and anicteric.  Mouth: pharynx pink and moist, no erythema, and no exudates.  Neck: supple, full ROM, no thyromegaly, no JVD, and no carotid bruits.  Lungs: coarse breath sounds. Bibasilar lung sound diminished. Faint rales noted on RLL. No wheezing or rhonchi noted. Heart: normal rate, regular rhythm, no murmur, no gallop, and no rub.  Abdomen: soft, non-tender, normal bowel sounds, no distention, no guarding, no rebound tenderness, no hepatomegaly, and no splenomegaly.  Msk: no joint swelling, no joint warmth, and no redness over joints.  Pulses: 2+ DP/PT pulses bilaterally Extremities: No cyanosis, clubbing, edema Neurologic: alert & oriented X3, cranial nerves II-XII intact, strength normal in all  extremities, sensation intact to light touch, and gait normal.  Skin: turgor normal and no rashes.  Psych: Oriented X3, memory intact for recent and remote, flat affect.  Not interested in talking and communication   Lab results: Basic Metabolic Panel:  [facility] 06/23/11 0947  Kealie Barrie 136  K 4.4  CL 96  CO2 26  GLUCOSE 108*  BUN 17  CREATININE 0.97  CALCIUM 9.1  MG --  PHOS --   CBC:  Basename 06/23/11 0947  WBC 9.9  NEUTROABS --  HGB 8.3*  HCT 27.1*  MCV 72.7*  PLT 400    Imaging results:  Dg Chest 2 View  06/23/2011  *RADIOLOGY REPORT*  Clinical Data: Back pain, chest pain and cough.  CHEST - 2 VIEW  Comparison: 06/13/2011  Findings: Two views of the chest demonstrates increased patchy densities in the right lower lung.  There are persistent patchy nodular densities in the left lung.  There appears to be chronic bronchiectasis and volume loss involving the right  middle lobe. Negative for a pneumothorax.   Heart size is within normal limits.  IMPRESSION: Increased patchy and nodular opacities in the right lower lung. Findings are most compatible with an infectious etiology.  Chronic or persistent patchy disease in the left lung.  Chronic volume loss in the right middle lobe.  Original Report Authenticated By: Richarda Overlie, M.D.    Other results: EKG: Sinus tachycardia  Assessment & Plan by Problem:  1) Cough and shortness of breath: Most likely from lung parenchymal infection ( community acquired versus opportunistic) - considering her history, past history of metastatic pneumonia in June 2012 and physical exam and the chronicity of cough, in lieu of her poorly controlled HIV( last CD4 count 30- in January 2013). Chest X ray shows increasing right lower lobe nodular and patchy opacity, along with chronic changes in right middle and left lung. Patient with borderline hypotension and mild tachypnea in ER along with mild rise in temperature- 100.5. Although patient not hypoxic.  -  Will admit to telemetry bed. -Respiratory isolation - Dr. Ninetta Lights consulted by ER physician before we saw the patient- who recommended covering for community acquired pneumonia and PCP pneumonia - started on azithromycin, ceftriaxone and Bactrim. - Will check ABG, LDH. If ABG shows PO2 less than 70 or A-a gradient more than 35- will and steroids  For treatment of presumable severe PCP pneumonia( unlikely for this to happen at present). - IV normal saline at 100 cc/hr - Oxygen support as needed. - Blood cultures x2, sputum culture, Gram stain and AFB, urine Legionella and strep antigen. - Patient has history of necrotizing pneumonia in June 2012 when she refused having bronchoscopy.  2) Sepsis secondary to pneumonia: Patient is a two out of four criteria for SIRS- tachypnea( respiratory rate more than 20) and heart rate more than 90. Patient's blood pressure dropped to 90s over high 50s after 1 L fluid bolus in ER- which potentially can change the diagnosis to severe sepsis. Lactic level 0.9- normal. Does not have any other hypoperfusion-induced endorgan damage at present. - Continue aggressive hydration and follow closely. - Antibiotic started and blood cultures collected.  3) AIDS:  Last CD4< 30 and VL 63724 Noncompliance with HIV medications for long time. - Will check a CD4 count and viral levels. - Continue her home HIV regimen . - Will talk with Dr. Ninetta Lights for further recommendations, if any.   4) Anemia of chronic disease, baseline 8-8.5      5) DVT prophylaxis: Lovenox.         Signed: Micala Saltsman 06/24/2011, 6:08 AM

## 2011-06-23 NOTE — Consult Note (Signed)
Date of Admission:  06/23/2011  Date of Consult:  06/23/2011  Reason for Consult:AIDS, pneumonia Referring Physician: Rogelia Boga  Impression/Recommendation AIDS Pneumonia- RLL patchy, nodular lung disease Would- Put in isolation Check sputum AFB Treat as CAP for now.  continue her ART (DRVr/EMT/ABC) Recheck her CD4 and VL  BAL/BX Fungal serologies PCP DFA Consider ANA, RF? Comment- she agrees to have bronch now. Unfortunately  we will not be able to obtain til at least Monday, she may feel better and want to leave by then.  A "typical" cause of pneumonia at this point seems unlikely. Could consider AFB (tb, atypical mycobacteria, nocardia, actiono, crypto, histo) as causes of nodular lung disease.   Paula Becker is an 36 y.o. female.  HPI: 36 yo Fwith hx of noncompliance, AIDS (prev on abacavir/epivir/isentress but could not handle the pills, wanted liquids), anemia, and MRSA breast abscess (March 2012).  She was admitted between June 5 and July 11, 2010 for necrotizing pneumonia. She had a CD-4  count of 30 and VL 12,000 (July 05, 2010). She refused bronchoscopy.  She was re-admitted July 30, 2010 with persistent pneumonia. CT scan of the chest showed bilateral upper lobe, left greater than right pneumonic infiltrates with necrotizing pattern and also left lower lobe infiltrate with necrotizing pattern. She was seen by pulmonary but refused bronch. Crypto (-), quantiferon gold indeterminate. Sputum Cx grew few MRSA (S-tet, bactrim). Sputum AFB (-).  A repeat CXR (08-15-10) : Little change in basilar opacities most consistent with pneumonia with probable small effusions.  Had repeat CXR (11-14-10): persistent nodular disease.  Has had increased cough for the last 2 months. Has had some occas chills. No fever.Cough has been prod of green sputum- no change in color, no blood. No lymphadenopathy. Weight has been down ? Amount.  Had taken her ART but states they gave her N/V. Has been off ART  for last week. Was seen by adherence counselor and was getting help taking her meds.   Past Medical History  Diagnosis Date  . HIV (human immunodeficiency virus infection)   . MRSA (methicillin resistant staph aureus) culture positive   . PNA (pneumonia)     Past Surgical History  Procedure Date  . Breast surgery     right  . Cesarean section   ergies:   Allergies  Allergen Reactions  . Shellfish-Derived Products     Facial swelling  . Vancomycin     REACTION: rash    Medications:  Scheduled:   . abacavir  600 mg Oral Daily  . acetaminophen  1,000 mg Oral Once  . azithromycin  500 mg Intravenous Once  . azithromycin  500 mg Intravenous Q24H  . cefTRIAXone (ROCEPHIN)  IV  1 g Intravenous Once  . cefTRIAXone (ROCEPHIN)  IV  1 g Intravenous Q24H  . emtricitabine  200 mg Oral Daily  . enoxaparin  40 mg Subcutaneous Q24H  . ritonavir  104 mg Oral Daily  . sodium chloride  1,000 mL Intravenous Once  . sodium chloride  1,000 mL Intravenous Once  . sulfamethoxazole-trimethoprim  40 mL Oral Q8H  . DISCONTD: sulfamethoxazole-trimethoprim  2 tablet Oral Once    Social History:  reports that she has never smoked. She has never used smokeless tobacco. She reports that she does not drink alcohol or use illicit drugs.  Family History  Problem Relation Age of Onset  . Lung disease Mother     passed away at 70 with lung disease  . Hypertension Father  General ROS: has felt more SOB, DOE. Normal BM and urination. No oral ulcers or thrush.   Blood pressure 93/58, pulse 95, temperature 98.2 F (36.8 C), temperature source Oral, resp. rate 28, height 5' 9.5" (1.765 m), weight 51.342 kg (113 lb 3 oz), last menstrual period 05/31/2011, SpO2 98.00%. General appearance: alert, cooperative and no distress Throat: no thrush or ulcers.  Neck: no adenopathy Lungs: rhonchi bilaterally Heart: regular rate and rhythm Abdomen: normal findings: bowel sounds normal and soft,  non-tender Lymph nodes: Cervical, supraclavicular, and axillary nodes normal.   Results for orders placed during the hospital encounter of 06/23/11 (from the past 48 hour(s))  CBC     Status: Abnormal   Collection Time   06/23/11  9:47 AM      Component Value Range Comment   WBC 9.9  4.0 - 10.5 (K/uL)    RBC 3.73 (*) 3.87 - 5.11 (MIL/uL)    Hemoglobin 8.3 (*) 12.0 - 15.0 (g/dL)    HCT 16.1 (*) 09.6 - 46.0 (%)    MCV 72.7 (*) 78.0 - 100.0 (fL)    MCH 22.3 (*) 26.0 - 34.0 (pg)    MCHC 30.6  30.0 - 36.0 (g/dL)    RDW 04.5 (*) 40.9 - 15.5 (%)    Platelets 400  150 - 400 (K/uL)   BASIC METABOLIC PANEL     Status: Abnormal   Collection Time   06/23/11  9:47 AM      Component Value Range Comment   Sodium 136  135 - 145 (mEq/L)    Potassium 4.4  3.5 - 5.1 (mEq/L)    Chloride 96  96 - 112 (mEq/L)    CO2 26  19 - 32 (mEq/L)    Glucose, Bld 108 (*) 70 - 99 (mg/dL)    BUN 17  6 - 23 (mg/dL)    Creatinine, Ser 8.11  0.50 - 1.10 (mg/dL)    Calcium 9.1  8.4 - 10.5 (mg/dL)    GFR calc non Af Amer 74 (*) >90 (mL/min)    GFR calc Af Amer 85 (*) >90 (mL/min)   POCT I-STAT TROPONIN I     Status: Normal   Collection Time   06/23/11 10:09 AM      Component Value Range Comment   Troponin i, poc 0.00  0.00 - 0.08 (ng/mL)    Comment 3            LACTIC ACID, PLASMA     Status: Normal   Collection Time   06/23/11 11:16 AM      Component Value Range Comment   Lactic Acid, Venous 0.9  0.5 - 2.2 (mmol/L)       Component Value Date/Time   SDES SPUTUM 08/02/2010 1520   SDES SPUTUM 08/02/2010 1520   SDES SPUTUM 08/02/2010 1520   SPECREQUEST NONE 08/02/2010 1520   SPECREQUEST NONE 08/02/2010 1520   SPECREQUEST NONE 08/02/2010 1520   CULT  Value: FEW METHICILLIN RESISTANT STAPHYLOCOCCUS AUREUS Note: RIFAMPIN AND GENTAMICIN SHOULD NOT BE USED AS SINGLE DRUGS FOR TREATMENT OF STAPH INFECTIONS. This organism DOES NOT demonstrate inducible Clindamycin resistance in vitro. CRITICAL RESULT CALLED TO, READ BACK BY AND  VERIFIED WITH: ZENIDA CALWITM  08/05/10 08:15 BY GARRS 08/02/2010 1520   CULT NO ACID FAST BACILLI ISOLATED IN 6 WEEKS 08/02/2010 1520   REPTSTATUS 08/02/2010 FINAL 08/02/2010 1520   REPTSTATUS 08/05/2010 FINAL 08/02/2010 1520   REPTSTATUS 09/14/2010 FINAL 08/02/2010 1520   Dg Chest 2 View  06/23/2011  *  RADIOLOGY REPORT*  Clinical Data: Back pain, chest pain and cough.  CHEST - 2 VIEW  Comparison: 06/13/2011  Findings: Two views of the chest demonstrates increased patchy densities in the right lower lung.  There are persistent patchy nodular densities in the left lung.  There appears to be chronic bronchiectasis and volume loss involving the right middle lobe. Negative for a pneumothorax.   Heart size is within normal limits.  IMPRESSION: Increased patchy and nodular opacities in the right lower lung. Findings are most compatible with an infectious etiology.  Chronic or persistent patchy disease in the left lung.  Chronic volume loss in the right middle lobe.  Original Report Authenticated By: Richarda Overlie, M.D.    Thank you so much for this interesting consult,   Johny Sax [phone] 06/23/2011, 4:34 PM     LOS: 0 days

## 2011-06-23 NOTE — ED Notes (Signed)
Patient reports onset of chest pain for 2 days.  Patient states the heat helps intermittently.  Patient also reports headache.  Temp is 105

## 2011-06-23 NOTE — ED Notes (Signed)
Pt undressed, in gown, on monitor, continuous pulse oximetry and blood pressure cuff 

## 2011-06-23 NOTE — ED Provider Notes (Signed)
Medical screening examination/treatment/procedure(s) were performed by non-physician practitioner and as supervising physician I was immediately available for consultation/collaboration.   Carleene Cooper III, MD 06/23/11 2034

## 2011-06-23 NOTE — Progress Notes (Signed)
@  2255. Dr Bosie Clos made aware of patients + MRSA  Swab. And patients refusal of due medications and blood drawings.

## 2011-06-23 NOTE — Progress Notes (Signed)
Pt. refused ABG, RN made aware. 

## 2011-06-23 NOTE — Progress Notes (Signed)
Pt arrived to 4741 from ED. Pt oriented to rm and hospital policies.  VS in doc flowsheets, NS infusing at 167ml/hr and pt with no complaints.  Will con't to monitor.

## 2011-06-24 LAB — URINALYSIS, ROUTINE W REFLEX MICROSCOPIC
Bilirubin Urine: NEGATIVE
Ketones, ur: NEGATIVE mg/dL
Nitrite: NEGATIVE
Protein, ur: 30 mg/dL — AB
Urobilinogen, UA: 2 mg/dL — ABNORMAL HIGH (ref 0.0–1.0)
pH: 6.5 (ref 5.0–8.0)

## 2011-06-24 LAB — URINE MICROSCOPIC-ADD ON

## 2011-06-24 MED ORDER — SULFAMETHOXAZOLE-TRIMETHOPRIM 400-80 MG/5ML IV SOLN
320.0000 mg | Freq: Three times a day (TID) | INTRAVENOUS | Status: DC
  Administered 2011-06-24 – 2011-06-28 (×13): 320 mg via INTRAVENOUS
  Filled 2011-06-24 (×28): qty 20

## 2011-06-24 MED ORDER — MIRTAZAPINE 15 MG PO TABS
15.0000 mg | ORAL_TABLET | Freq: Every day | ORAL | Status: DC
  Filled 2011-06-24 (×5): qty 1

## 2011-06-24 NOTE — H&P (Signed)
Internal Medicine Teaching Service Attending Note Date: 06/24/2011  Patient name: Paula Becker  Medical record number: [id number]  Date of birth: February 17, 1975   I have seen and evaluated Lauris Chroman and discussed their care with the Residency Team. Please see Dr Stevenson Clinch H&P for full details. I agree with the formulated Assessment and Plan with the following changes:   Ms Hayman is a 36 yo woman with AIDS who has been unable to comply with anti-viral therapy although she states that she has taken meds daily for past 2 weeks. Hx is mostly from records as pt states "she is tired" and not willing to participate in a H&P. She has had a cough for 2 months and was seen on the 18th in the [facility] and CXR was felt to be c/w PCP and the pt was started on tx dose bactrim but was non-compliant. Her cough cont, along with pleuritic CP, subjective fevers / sweats / chills / night sweats and pt presented to ED for tx.   On exam, she is febrile but not hypoxic. Pt refused exam today.   Labs revealed chronic stable anemia, nl electrolytes. CXR with pathy nodular densities B, slightly worse than last CXR. Increased in RLL.  EKG non specific T wave changes inferiorly  A/P. 1. PNA - CAP vs PCP. Pt needs induced sputum or BAL but refuses both so empiric tx for both CAP and PCP. Resp isolation. Blood cx.   2. AIDS - we do not stock liquid antivirals. Pt states she called family to bring her own supply in but doesn't know if they will. On PCP tx and full tx dose azithro.  3. Anemia of chronic dz - follow as she is stable and asymptomatic. She had iron studies 06/02/11 and iron <10, ferritin 113, and TIBC & % sat could not be calc. Could still be iron def but will not tx with iron in setting of acute infxn in pt with compliance issues.  4. Code status - her prognosis is very poor unless she is able to increase her compliance. [facility] is working extensively with her on that. She is not able to participate in a code / goals of  care conf (I assume she is competent but just not willing to converse) so therefore she is a full code.

## 2011-06-24 NOTE — Progress Notes (Addendum)
Subjective: Per nursing staff report, she refused to take oral medications last night.  She told the medical staff that she would rather take liquid form of her medications, however, she refused to take liquid form Bactrim for PCP treatment. She has liquid form of her HRT medications at home and will ask her family to bring them to the hospital. I have talked to our pharmacist who will change her Bactrim to IV infusions.  However, we do not carry liquid form of ART medications in house.   She avoid eye contact when I met her this morning.  Patient appeared calm and sound of mind.  She spoke very little and refused to give the permission for bronchoscopy.  She stated she never agreed with it and she would think about it.  She told me to not bother her and refused for me to perform physical examination.  When asking how I could help her, she asked me to go out and " leave me alone."    Patient is instructed that she could have nurse to page me if she wants to talk to me later. She agrees with the plan.  Objective: Vital signs in last 24 hours: Filed Vitals:   06/23/11 2242 06/24/11 0320 06/24/11 0500 06/24/11 0712  BP: 118/82 117/72  90/54  Pulse: 82 112  94  Temp: 97.2 F (36.2 C) 101.2 F (38.4 C) 99.2 F (37.3 C) 98.6 F (37 C)  TempSrc: Oral Oral  Oral  Resp: 22 22  20   Height:      Weight:    117 lb 6.4 oz (53.252 kg)  SpO2: 97% 94%  93%   Weight change:   Intake/Output Summary (Last 24 hours) at 06/24/11 1211 Last data filed at 06/24/11 0600  Gross per 24 hour  Intake 1448.33 ml  Output    350 ml  Net 1098.33 ml   The rest of physical examination is not performed bc she refused the exam.  General: Very thin appearing, wasted  Psych: Oriented X3, memory intact for recent and remote, flat affect. Not interested in talking and communication   Lab Results: Basic Metabolic Panel:  Lab 06/23/11 2130  Riona Lahti 136  K 4.4  CL 96  CO2 26  GLUCOSE 108*  BUN 17  CREATININE 0.97    CALCIUM 9.1  MG --  PHOS --   Liver Function Tests:  Lab 06/23/11 1625  AST 26  ALT 11  ALKPHOS 117  BILITOT 0.2*  PROT 8.0  ALBUMIN 2.2*   CBC:  Lab 06/23/11 0947  WBC 9.9  NEUTROABS --  HGB 8.3*  HCT 27.1*  MCV 72.7*  PLT 400  Urinalysis:  Lab 06/24/11 0307  COLORURINE YELLOW  LABSPEC 1.012  PHURINE 6.5  GLUCOSEU NEGATIVE  HGBUR NEGATIVE  BILIRUBINUR NEGATIVE  KETONESUR NEGATIVE  PROTEINUR 30*  UROBILINOGEN 2.0*  NITRITE NEGATIVE  LEUKOCYTESUR TRACE*   Micro Results: Recent Results (from the past 240 hour(s))  CULTURE, BLOOD (ROUTINE X 2)     Status: Normal (Preliminary result)   Collection Time   06/23/11 11:16 AM      Component Value Range Status Comment   Specimen Description BLOOD RIGHT ARM   Final    Special Requests BOTTLES DRAWN AEROBIC AND ANAEROBIC 10CC   Final    Culture  Setup Time [id number]   Final    Culture     Final    Value:        BLOOD CULTURE RECEIVED NO GROWTH TO  DATE CULTURE WILL BE HELD FOR 5 DAYS BEFORE ISSUING A FINAL NEGATIVE REPORT   Report Status PENDING   Incomplete   CULTURE, BLOOD (ROUTINE X 2)     Status: Normal (Preliminary result)   Collection Time   06/23/11 11:35 AM      Component Value Range Status Comment   Specimen Description BLOOD LEFT ARM   Final    Special Requests BOTTLES DRAWN AEROBIC AND ANAEROBIC 10CC   Final    Culture  Setup Time [id number]   Final    Culture     Final    Value:        BLOOD CULTURE RECEIVED NO GROWTH TO DATE CULTURE WILL BE HELD FOR 5 DAYS BEFORE ISSUING A FINAL NEGATIVE REPORT   Report Status PENDING   Incomplete   MRSA PCR SCREENING     Status: Abnormal   Collection Time   06/23/11  4:18 PM      Component Value Range Status Comment   MRSA by PCR POSITIVE (*) NEGATIVE  Final    Studies/Results: Dg Chest 2 View  06/23/2011  *RADIOLOGY REPORT*  Clinical Data: Back pain, chest pain and cough.  CHEST - 2 VIEW  Comparison: 06/13/2011  Findings: Two views of the chest demonstrates  increased patchy densities in the right lower lung.  There are persistent patchy nodular densities in the left lung.  There appears to be chronic bronchiectasis and volume loss involving the right middle lobe. Negative for a pneumothorax.   Heart size is within normal limits.  IMPRESSION: Increased patchy and nodular opacities in the right lower lung. Findings are most compatible with an infectious etiology.  Chronic or persistent patchy disease in the left lung.  Chronic volume loss in the right middle lobe.  Original Report Authenticated By: Richarda Overlie, M.D.   Medications: I have reviewed the patient's current medications. Scheduled Meds:   . abacavir  600 mg Oral Daily  . acetaminophen  1,000 mg Oral Once  . azithromycin  500 mg Intravenous Once  . azithromycin  500 mg Intravenous Q24H  . cefTRIAXone (ROCEPHIN)  IV  1 g Intravenous Once  . cefTRIAXone (ROCEPHIN)  IV  1 g Intravenous Q24H  . Chlorhexidine Gluconate Cloth  6 each Topical Q0600  . darunavir  800 mg Oral Q breakfast  . emtricitabine  200 mg Oral Daily  . enoxaparin  40 mg Subcutaneous Q24H  . mirtazapine  15 mg Oral QHS  . mupirocin ointment  1 application Nasal BID  . ritonavir  104 mg Oral Daily  . sodium chloride  1,000 mL Intravenous Once  . sodium chloride  1,000 mL Intravenous Once  . sulfamethoxazole-trimethoprim  320 mg Intravenous Q8H  . DISCONTD: sulfamethoxazole-trimethoprim  40 mL Oral Q8H   Continuous Infusions:   . sodium chloride 1,000 mL (06/24/11 0807)   PRN Meds:.acetaminophen, acetaminophen, ondansetron (ZOFRAN) IV, ondansetron Assessment/Plan:  1) Cough and shortness of breath: PCP and/or CAP     Refused Bronchoscopy  -Respiratory isolation for AFB - Oxygen as needed  - Blood cultures x2, sputum culture, Gram stain and AFB, urine Legionella and strep antigen. -- LDH normal, patient refused ABG>>> given her O2 sats of 94-98% on room, her PaO2 most likely above 70. - On azithromycin, ceftriaxone  and Bactrim per Dr. Aileen Pilot.  - IV normal saline at 100 cc/hr  - Patient has history of necrotizing pneumonia in June 2012 when she refused having bronchoscopy.   2) Sepsis secondary to pneumonia:  Meets criteria for sepsis and responds to IVF therapy.  Lactic level 0.9- normal.   - Continue IVF hydration and follow closely.  - Antibiotic started and blood cultures collected.   3) AIDS: Last CD4< 30 and VL 63724  Noncompliance with HIV medications for long time.  She reports that she started to take her ART medication in last couple weeks.  She does not want to take pill form and will only take liquids form.  I called the pharmacist who states that one of her four ART medications does not come with liquid form and that we do not carry liquid form in house today.  We may put in request to order liquid form medications, however, we will not be able to get them until next Tuesday.  Patient agreed to bring her home ART medications to hospital.  - CD4/VL pending.  - Continue her home HIV regimen>>>patient refused now .  - Will talk with Dr. Ninetta Lights for further recommendations, if any.   4) Anemia of chronic disease, baseline 8-8.5  5) DVT prophylaxis: Lovenox.    LOS: 1 day   Odester Nilson 06/24/2011, 12:11 PM

## 2011-06-24 NOTE — Progress Notes (Signed)
ANTIBIOTIC CONSULT NOTE - INITIAL  Pharmacy Consult for Bactrim IV & HIV meds Indication: r/o PNA (CAP vs PCP)  Assessment: 36 yo female with hx/o HIV non-adherence now presents with productive cough x2 months & SOB x2 weeks.  She has not been taking ARV or Bactrim due to lack of desire and complaints of nausea.  Tm 101.2, WBC 9.9, MRSA PCR (+).  Pharmacy consulted by IM to dose IV Bactrim for PCP treatment and review ARV agents.  Pharmacy does not have liquid suspension forms of abacavir, emtricitabine or ritonavir.  Patient will need to supply from home.  Plan:  1. Bactrim 320mg  IV q8h 2. Follow-up on patient-supplied HIV meds  Paula Becker. Saul Fordyce, PharmD Pager: [phone] 06/24/2011,9:46 AM  Allergies  Allergen Reactions  . Shellfish-Derived Products Swelling    Facial swelling  . Vancomycin Rash    Patient Measurements: Height: 5' 9.5" (176.5 cm) Weight: 117 lb 6.4 oz (53.252 kg) (bed scale) IBW/kg (Calculated) : 67.35   Vital Signs: Temp: 98.6 F (37 C) (05/25 7846) Temp src: Oral (05/25 0712) BP: 90/54 mmHg (05/25 0712) Pulse Rate: 94  (05/25 0712) Intake/Output from previous day: 05/24 0701 - 05/25 0700 In: 1448.3 [P.O.:360; I.V.:1088.3] Out: 350 [Urine:350] Intake/Output from this shift:    Labs:  [facility] 06/23/11 0947  WBC 9.9  HGB 8.3*  PLT 400  LABCREA --  CREATININE 0.97   Estimated Creatinine Clearance: 66.8 ml/min (by C-G formula based on Cr of 0.97). No results found for this basename: VANCOTROUGH:2,VANCOPEAK:2,VANCORANDOM:2,GENTTROUGH:2,GENTPEAK:2,GENTRANDOM:2,TOBRATROUGH:2,TOBRAPEAK:2,TOBRARND:2,AMIKACINPEAK:2,AMIKACINTROU:2,AMIKACIN:2, in the last 72 hours   Microbiology: Recent Results (from the past 720 hour(s))  MRSA PCR SCREENING     Status: Abnormal   Collection Time   06/23/11  4:18 PM      Component Value Range Status Comment   MRSA by PCR POSITIVE (*) NEGATIVE  Final     Medical History: Past Medical History  Diagnosis Date  . HIV  (human immunodeficiency virus infection)   . MRSA (methicillin resistant staph aureus) culture positive   . Necrotizing pneumonia 07/2010  . Pneumonia 06/23/11    RLL patchy, nodular lung disease  . Anemia of chronic disease   . History of noncompliance with medical treatment     Medications:  Scheduled:    . abacavir  600 mg Oral Daily  . acetaminophen  1,000 mg Oral Once  . azithromycin  500 mg Intravenous Once  . azithromycin  500 mg Intravenous Q24H  . cefTRIAXone (ROCEPHIN)  IV  1 g Intravenous Once  . cefTRIAXone (ROCEPHIN)  IV  1 g Intravenous Q24H  . Chlorhexidine Gluconate Cloth  6 each Topical Q0600  . darunavir  800 mg Oral Q breakfast  . emtricitabine  200 mg Oral Daily  . enoxaparin  40 mg Subcutaneous Q24H  . mirtazapine  15 mg Oral QHS  . mupirocin ointment  1 application Nasal BID  . ritonavir  104 mg Oral Daily  . sodium chloride  1,000 mL Intravenous Once  . sodium chloride  1,000 mL Intravenous Once  . sulfamethoxazole-trimethoprim  320 mg Intravenous Q8H  . DISCONTD: sulfamethoxazole-trimethoprim  2 tablet Oral Once  . DISCONTD: sulfamethoxazole-trimethoprim  40 mL Oral Q8H   Infusions:    . sodium chloride 1,000 mL (06/24/11 0807)

## 2011-06-25 LAB — URINE CULTURE

## 2011-06-25 MED ORDER — MORPHINE SULFATE 2 MG/ML IJ SOLN
0.5000 mg | INTRAMUSCULAR | Status: DC | PRN
  Administered 2011-06-25 – 2011-06-26 (×3): 1 mg via INTRAVENOUS
  Filled 2011-06-25 (×3): qty 1

## 2011-06-25 NOTE — Progress Notes (Signed)
Subjective: Patient feels okay, feels a little better than yesterday. Says her breathing is fine and has no nausea vomiting, abdominal pain, chest pain. On discussing about trying liquid HIV medications, she again refused as she cannot tolerate them. Also still does not want to get bronchoscopy. Patient still refusing blood draws.   afebrile overnight .  Objective: Vital signs in last 24 hours: Filed Vitals:   06/24/11 0712 06/24/11 1455 06/24/11 2317 06/25/11 0456  BP: 90/54 97/61 101/67 91/66  Pulse: 94 97 93 93  Temp: 98.6 F (37 C) 97.5 F (36.4 C) 98.7 F (37.1 C) 97.3 F (36.3 C)  TempSrc: Oral Oral Oral Oral  Resp: 20 18 18 18   Height:      Weight: 117 lb 6.4 oz (53.252 kg)     SpO2: 93% 96% 95% 93%   Weight change: -9.6 oz (-0.272 kg)  Intake/Output Summary (Last 24 hours) at 06/25/11 0934 Last data filed at 06/24/11 2100  Gross per 24 hour  Intake   1700 ml  Output    825 ml  Net    875 ml   General: resting in bed comfortably HEENT: PERRL, EOMI, no scleral icterus Cardiac: RRR, no rubs, murmurs or gallops Pulm: Fine bibasilar rales. Good air entry bilaterally. No wheezes Abd: soft, nontender, nondistended, BS present Ext: warm and well perfused, no pedal edema Neuro: alert and oriented X3, cranial nerves II-XII grossly intact Psych: Oriented X3, memory intact for recent and remote, flat affect.   Lab Results: Basic Metabolic Panel:  Lab 06/23/11 1610  NA 136  K 4.4  CL 96  CO2 26  GLUCOSE 108*  BUN 17  CREATININE 0.97  CALCIUM 9.1  MG --  PHOS --   Liver Function Tests:  Lab 06/23/11 1625  AST 26  ALT 11  ALKPHOS 117  BILITOT 0.2*  PROT 8.0  ALBUMIN 2.2*   CBC:  Lab 06/23/11 0947  WBC 9.9  NEUTROABS --  HGB 8.3*  HCT 27.1*  MCV 72.7*  PLT 400  Urinalysis:  Lab 06/24/11 0307  COLORURINE YELLOW  LABSPEC 1.012  PHURINE 6.5  GLUCOSEU NEGATIVE  HGBUR NEGATIVE  BILIRUBINUR NEGATIVE  KETONESUR NEGATIVE  PROTEINUR 30*   UROBILINOGEN 2.0*  NITRITE NEGATIVE  LEUKOCYTESUR TRACE*   Micro Results: Recent Results (from the past 240 hour(s))  CULTURE, BLOOD (ROUTINE X 2)     Status: Normal (Preliminary result)   Collection Time   06/23/11 11:16 AM      Component Value Range Status Comment   Specimen Description BLOOD RIGHT ARM   Final    Special Requests BOTTLES DRAWN AEROBIC AND ANAEROBIC 10CC   Final    Culture  Setup Time [id number]   Final    Culture     Final    Value:        BLOOD CULTURE RECEIVED NO GROWTH TO DATE CULTURE WILL BE HELD FOR 5 DAYS BEFORE ISSUING A FINAL NEGATIVE REPORT   Report Status PENDING   Incomplete   CULTURE, BLOOD (ROUTINE X 2)     Status: Normal (Preliminary result)   Collection Time   06/23/11 11:35 AM      Component Value Range Status Comment   Specimen Description BLOOD LEFT ARM   Final    Special Requests BOTTLES DRAWN AEROBIC AND ANAEROBIC 10CC   Final    Culture  Setup Time [id number]   Final    Culture     Final    Value:  BLOOD CULTURE RECEIVED NO GROWTH TO DATE CULTURE WILL BE HELD FOR 5 DAYS BEFORE ISSUING A FINAL NEGATIVE REPORT   Report Status PENDING   Incomplete   MRSA PCR SCREENING     Status: Abnormal   Collection Time   06/23/11  4:18 PM      Component Value Range Status Comment   MRSA by PCR POSITIVE (*) NEGATIVE  Final    Studies/Results: Dg Chest 2 View  06/23/2011  *RADIOLOGY REPORT*  Clinical Data: Back pain, chest pain and cough.  CHEST - 2 VIEW  Comparison: 06/13/2011  Findings: Two views of the chest demonstrates increased patchy densities in the right lower lung.  There are persistent patchy nodular densities in the left lung.  There appears to be chronic bronchiectasis and volume loss involving the right middle lobe. Negative for a pneumothorax.   Heart size is within normal limits.  IMPRESSION: Increased patchy and nodular opacities in the right lower lung. Findings are most compatible with an infectious etiology.  Chronic or persistent  patchy disease in the left lung.  Chronic volume loss in the right middle lobe.  Original Report Authenticated By: Richarda Overlie, M.D.   Medications: I have reviewed the patient's current medications. Scheduled Meds:    . abacavir  600 mg Oral Daily  . azithromycin  500 mg Intravenous Q24H  . cefTRIAXone (ROCEPHIN)  IV  1 g Intravenous Q24H  . Chlorhexidine Gluconate Cloth  6 each Topical Q0600  . darunavir  800 mg Oral Q breakfast  . emtricitabine  200 mg Oral Daily  . enoxaparin  40 mg Subcutaneous Q24H  . mirtazapine  15 mg Oral QHS  . mupirocin ointment  1 application Nasal BID  . ritonavir  104 mg Oral Daily  . sulfamethoxazole-trimethoprim  320 mg Intravenous Q8H  . DISCONTD: sulfamethoxazole-trimethoprim  40 mL Oral Q8H   Continuous Infusions:    . sodium chloride 100 mL/hr at 06/25/11 0101   PRN Meds:.acetaminophen, acetaminophen, ondansetron (ZOFRAN) IV, ondansetron Assessment/Plan:  1) Cough and shortness of breath: PCP and/or CAP     Refused Bronchoscopy  -Respiratory isolation for AFB - Oxygen as needed  - Blood cultures x2, sputum culture, Gram stain and AFB, urine Legionella and strep antigen ( negative )  -- LDH normal, patient refused ABG>>> given her O2 sats of 94-98% on room, her PaO2 most likely above 70. - On azithromycin, ceftriaxone and Bactrim per Dr. Aileen Pilot.  - IV normal saline at 100 cc/hr  - Patient has history of necrotizing pneumonia in June 2012 when she refused having bronchoscopy.   2) Sepsis secondary to pneumonia:     Meets criteria for sepsis and responds to IVF therapy.  Lactic level 0.9- normal.   - Continue IVF hydration and follow closely.  - Antibiotic started and blood cultures collected-  negative to date- final result pending.  3) AIDS: Last CD4< 30 and VL 63724  Noncompliance with HIV medications for long time.  She reports that she started to take her ART medication in last couple weeks.  She does not want to take pill form  and will only take liquids form.  I called the pharmacist who states that one of her four ART medications does not come with liquid form and that we do not carry liquid form in house today.  We may put in request to order liquid form medications, however, we will not be able to get them until next Tuesday.  Patient agreed to bring her  home ART medications to hospital.  - CD4/VL pending.  - Continue her home HIV regimen>>>patient refused now .  - Will talk with Dr. Ninetta Lights for further recommendations, if any.   4) Anemia of chronic disease, baseline 8-8.5   5) DVT prophylaxis: Lovenox.   6)  Disposition: unclear about disposition plan yet. Patient is stable at present, but not taking any HIV medications- which is the ultimate goal for long term survival. Has been extensively educated and worked by ID clinic- but adherence to medications has been a big issue.     LOS: 2 days   Zaniel Marineau 06/25/2011, 9:34 AM

## 2011-06-26 ENCOUNTER — Inpatient Hospital Stay (HOSPITAL_COMMUNITY): Payer: Medicare Other

## 2011-06-26 LAB — LEGIONELLA ANTIGEN, URINE: Legionella Antigen, Urine: NEGATIVE

## 2011-06-26 MED ORDER — FLUCONAZOLE 100MG IVPB
100.0000 mg | INTRAVENOUS | Status: DC
  Administered 2011-06-26 – 2011-06-27 (×2): 100 mg via INTRAVENOUS
  Filled 2011-06-26 (×4): qty 50

## 2011-06-26 NOTE — Progress Notes (Addendum)
INFECTIOUS DISEASE PROGRESS NOTE  ID: Paula Becker is a 36 y.o. female with    Principal Problem:  *Pneumonia Active Problems:  AIDS  Anemia of chronic disease  Sepsis  Subjective: Productive cough, states that it does not come up. States that she is taking her pills (nurse verified that she does not). She became upset and stated that she has not taken her ART as she took it for 2 weeks and then developed n/v. She states that she called Tamika (clinic LPN) and that she was given an appt for the 30th.   Abtx:  Anti-infectives     Start     Dose/Rate Route Frequency Ordered Stop   06/26/11 1030   fluconazole (DIFLUCAN) IVPB 100 mg        100 mg 50 mL/hr over 60 Minutes Intravenous Every 24 hours 06/26/11 0935 07/03/11 1029   06/24/11 1000   azithromycin (ZITHROMAX) 500 mg in dextrose 5 % 250 mL IVPB        500 mg 250 mL/hr over 60 Minutes Intravenous Every 24 hours 06/23/11 1507     06/24/11 1000   cefTRIAXone (ROCEPHIN) 1 g in dextrose 5 % 50 mL IVPB        1 g 100 mL/hr over 30 Minutes Intravenous Every 24 hours 06/23/11 1507     06/24/11 0944  sulfamethoxazole-trimethoprim (BACTRIM) 320 mg in dextrose 5 % 500 mL IVPB       320 mg 346.7 mL/hr over 90 Minutes Intravenous 3 times per day 06/24/11 0944     06/24/11 0700   Darunavir Ethanolate (PREZISTA) tablet 800 mg        800 mg Oral Daily with breakfast 06/23/11 1700     06/23/11 1800   abacavir (ZIAGEN) solution 600 mg     Comments: Patient's own supply      600 mg Oral Daily 06/23/11 1506     06/23/11 1800   emtricitabine (EMTRIVA) 10 MG/ML solution 200 mg     Comments: Patient's own suppy      200 mg Oral Daily 06/23/11 1506     06/23/11 1800   ritonavir (NORVIR) 80 MG/ML solution 104 mg     Comments: Patient's own supply      104 mg Oral Daily 06/23/11 1506     06/23/11 1300   sulfamethoxazole-trimethoprim (BACTRIM,SEPTRA) 200-40 MG/5ML suspension 40 mL  Status:  Discontinued        40 mL Oral Every 8 hours  06/23/11 1211 06/24/11 0943   06/23/11 1200   azithromycin (ZITHROMAX) 500 mg in dextrose 5 % 250 mL IVPB        500 mg 250 mL/hr over 60 Minutes Intravenous  Once 06/23/11 1148 06/23/11 1401   06/23/11 1200   cefTRIAXone (ROCEPHIN) 1 g in dextrose 5 % 50 mL IVPB        1 g 100 mL/hr over 30 Minutes Intravenous  Once 06/23/11 1148 06/23/11 1229   06/23/11 1200   sulfamethoxazole-trimethoprim (BACTRIM DS) 800-160 MG per tablet 2 tablet  Status:  Discontinued        2 tablet Oral  Once 06/23/11 1148 06/23/11 1211          Medications:  Scheduled:   . abacavir  600 mg Oral Daily  . azithromycin  500 mg Intravenous Q24H  . cefTRIAXone (ROCEPHIN)  IV  1 g Intravenous Q24H  . Chlorhexidine Gluconate Cloth  6 each Topical Q0600  . darunavir  800 mg Oral Q breakfast  .  emtricitabine  200 mg Oral Daily  . enoxaparin  40 mg Subcutaneous Q24H  . fluconazole (DIFLUCAN) IV  100 mg Intravenous Q24H  . mirtazapine  15 mg Oral QHS  . mupirocin ointment  1 application Nasal BID  . ritonavir  104 mg Oral Daily  . sulfamethoxazole-trimethoprim  320 mg Intravenous Q8H    Objective: Vital signs in last 24 hours: Temp:  [97 F (36.1 C)-98.3 F (36.8 C)] 97 F (36.1 C) (05/27 1318) Pulse Rate:  [87-99] 87  (05/27 1318) Resp:  [18] 18  (05/27 1318) BP: (95-112)/(63-71) 95/63 mmHg (05/27 1318) SpO2:  [91 %-93 %] 93 % (05/27 1318) Weight:  [69 kg (152 lb 1.9 oz)] 69 kg (152 lb 1.9 oz) (05/27 0512)   General appearance: alert, cooperative and no distress see a/p for details of conversation  Lab Results No results found for this basename: WBC:2,HGB:2,HCT:2,PLATELETS:2,NA:2,K:2,CL:2,CO2:2,BUN:2,CREATININE:2,GLU:2 in the last 72 hours Liver Panel No results found for this basename: PROT:2,ALBUMIN:2,AST:2,ALT:2,ALKPHOS:2,BILITOT:2,BILIDIR:2,IBILI:2 in the last 72 hours Sedimentation Rate No results found for this basename: ESRSEDRATE in the last 72 hours C-Reactive Protein No results found  for this basename: CRP:2 in the last 72 hours  Microbiology: Recent Results (from the past 240 hour(s))  CULTURE, BLOOD (ROUTINE X 2)     Status: Normal (Preliminary result)   Collection Time   06/23/11 11:16 AM      Component Value Range Status Comment   Specimen Description BLOOD RIGHT ARM   Final    Special Requests BOTTLES DRAWN AEROBIC AND ANAEROBIC 10CC   Final    Culture  Setup Time [id number]   Final    Culture     Final    Value:        BLOOD CULTURE RECEIVED NO GROWTH TO DATE CULTURE WILL BE HELD FOR 5 DAYS BEFORE ISSUING A FINAL NEGATIVE REPORT   Report Status PENDING   Incomplete   CULTURE, BLOOD (ROUTINE X 2)     Status: Normal (Preliminary result)   Collection Time   06/23/11 11:35 AM      Component Value Range Status Comment   Specimen Description BLOOD LEFT ARM   Final    Special Requests BOTTLES DRAWN AEROBIC AND ANAEROBIC 10CC   Final    Culture  Setup Time [id number]   Final    Culture     Final    Value:        BLOOD CULTURE RECEIVED NO GROWTH TO DATE CULTURE WILL BE HELD FOR 5 DAYS BEFORE ISSUING A FINAL NEGATIVE REPORT   Report Status PENDING   Incomplete   MRSA PCR SCREENING     Status: Abnormal   Collection Time   06/23/11  4:18 PM      Component Value Range Status Comment   MRSA by PCR POSITIVE (*) NEGATIVE  Final   URINE CULTURE     Status: Normal   Collection Time   06/24/11  3:07 AM      Component Value Range Status Comment   Specimen Description URINE, CLEAN CATCH   Final    Special Requests Immunocompromised   Final    Culture  Setup Time [id number]   Final    Colony Count 80,000 COLONIES/ML   Final    Culture     Final    Value: Multiple bacterial morphotypes present, none predominant. Suggest appropriate recollection if clinically indicated.   Report Status 06/25/2011 FINAL   Final     Studies/Results: No results found.   Assessment/Plan: AIDS  Pneumonia Day 4 azithro/ceftriaxone/bactrim I spoke to the patient at length regarding her  therapies. I made it clear to her that we are not sure what is causing her pneumonia and it has gotten worse over the last 3 hospital visits. I asked her if she was willing to undergo bronchoscopy and BAL however she refuses this. I made clear to her that if she did not take her medicines and we are not able to prescribe her appropriate therapies and we are not able to do appropriate diagnostic tests, that she may die. She asked me if I was threatening her. I made clear to her that I was not in however without being able to appropriately diagnose her and treat her there is very little we can do to help her. I asked her if she was ready to die and if she did not want anymore tests done, or anymore medicine.   At this point would recommend a repeat chest x-ray and a psychiatric evaluation in the morning.  Johny Sax Infectious Diseases [phone] 06/26/2011, 5:10 PM   LOS: 3 days

## 2011-06-26 NOTE — Progress Notes (Signed)
Chaplain spoke with patient's nurse. Patient's nurse asked patient if she would like a chaplain visit. Patient declined. No follow up needed.

## 2011-06-26 NOTE — Progress Notes (Signed)
Subjective: Patient continues to refuse all of her oral medications and refuse blood draw. . She states that she would not take pill or liquid form, raised her voice, " I told you guys that I am not going to take those". Patient denies any pain or discomfort when drinking water or ingest food.   She initially talked to me a bit and told me her lower back been bothering her. When I discussed with her about oral pain medications for maintenance and morphine IV for breakthrough pain, she started to avoid eye contact and told me that she would not take any oral medications. She then obvious lost interest in any interaction with me, and closed her eyes. I told her  That she could always have her nurse to page me if needed. She agreed the plan.  Objective: Vital signs in last 24 hours: Filed Vitals:   06/25/11 1430 06/25/11 2143 06/26/11 0512 06/26/11 0633  BP: 93/62 112/71 95/64   Pulse: 89 99 89   Temp: 98.7 F (37.1 C) 98.3 F (36.8 C) 97.3 F (36.3 C) 97.2 F (36.2 C)  TempSrc: Oral Oral Oral   Resp: 18 18 18    Height:      Weight:   152 lb 1.9 oz (69 kg)   SpO2: 94% 91% 92%    Weight change: 34 lb 11.5 oz (15.748 kg)  Intake/Output Summary (Last 24 hours) at 06/26/11 0913 Last data filed at 06/26/11 0700  Gross per 24 hour  Intake   1900 ml  Output   1950 ml  Net    -50 ml   The rest of physical examination is not performed bc she refused the exam.  General: Very thin appearing, wasted  Mouth: oral thrush noted.  Neck: no JVD Lungs some rhonchi noted on right anterior lung, no wheezing or rales note. She refused to let me examine her posterior lung.  Heart: RRR, No M/G/R. Ext: no edema Psych: Oriented X3, memory intact for recent and remote, flat affect. Not interested in talking and communication   Lab Results: Basic Metabolic Panel:  Lab 06/23/11 4540  Paula Becker 136  K 4.4  CL 96  CO2 26  GLUCOSE 108*  BUN 17  CREATININE 0.97  CALCIUM 9.1  MG --  PHOS --   Liver  Function Tests:  Lab 06/23/11 1625  AST 26  ALT 11  ALKPHOS 117  BILITOT 0.2*  PROT 8.0  ALBUMIN 2.2*    CBC:  Lab 06/23/11 0947  WBC 9.9  NEUTROABS --  HGB 8.3*  HCT 27.1*  MCV 72.7*  PLT 400  Urinalysis:  Lab 06/24/11 0307  COLORURINE YELLOW  LABSPEC 1.012  PHURINE 6.5  GLUCOSEU NEGATIVE  HGBUR NEGATIVE  BILIRUBINUR NEGATIVE  KETONESUR NEGATIVE  PROTEINUR 30*  UROBILINOGEN 2.0*  NITRITE NEGATIVE  LEUKOCYTESUR TRACE*   Micro Results: Recent Results (from the past 240 hour(s))  CULTURE, BLOOD (ROUTINE X 2)     Status: Normal (Preliminary result)   Collection Time   06/23/11 11:16 AM      Component Value Range Status Comment   Specimen Description BLOOD RIGHT ARM   Final    Special Requests BOTTLES DRAWN AEROBIC AND ANAEROBIC 10CC   Final    Culture  Setup Time [id number]   Final    Culture     Final    Value:        BLOOD CULTURE RECEIVED NO GROWTH TO DATE CULTURE WILL BE HELD FOR 5 DAYS BEFORE ISSUING  A FINAL NEGATIVE REPORT   Report Status PENDING   Incomplete   CULTURE, BLOOD (ROUTINE X 2)     Status: Normal (Preliminary result)   Collection Time   06/23/11 11:35 AM      Component Value Range Status Comment   Specimen Description BLOOD LEFT ARM   Final    Special Requests BOTTLES DRAWN AEROBIC AND ANAEROBIC 10CC   Final    Culture  Setup Time [id number]   Final    Culture     Final    Value:        BLOOD CULTURE RECEIVED NO GROWTH TO DATE CULTURE WILL BE HELD FOR 5 DAYS BEFORE ISSUING A FINAL NEGATIVE REPORT   Report Status PENDING   Incomplete   MRSA PCR SCREENING     Status: Abnormal   Collection Time   06/23/11  4:18 PM      Component Value Range Status Comment   MRSA by PCR POSITIVE (*) NEGATIVE  Final   URINE CULTURE     Status: Normal   Collection Time   06/24/11  3:07 AM      Component Value Range Status Comment   Specimen Description URINE, CLEAN CATCH   Final    Special Requests Immunocompromised   Final    Culture  Setup Time  [id number]   Final    Colony Count 80,000 COLONIES/ML   Final    Culture     Final    Value: Multiple bacterial morphotypes present, none predominant. Suggest appropriate recollection if clinically indicated.   Report Status 06/25/2011 FINAL   Final    Studies/Results: No results found. Medications: I have reviewed the patient's current medications. Scheduled Meds:   . abacavir  600 mg Oral Daily  . azithromycin  500 mg Intravenous Q24H  . cefTRIAXone (ROCEPHIN)  IV  1 g Intravenous Q24H  . Chlorhexidine Gluconate Cloth  6 each Topical Q0600  . darunavir  800 mg Oral Q breakfast  . emtricitabine  200 mg Oral Daily  . enoxaparin  40 mg Subcutaneous Q24H  . mirtazapine  15 mg Oral QHS  . mupirocin ointment  1 application Nasal BID  . ritonavir  104 mg Oral Daily  . sulfamethoxazole-trimethoprim  320 mg Intravenous Q8H   Continuous Infusions:   . sodium chloride 100 mL/hr at 06/26/11 0835   PRN Meds:.acetaminophen, acetaminophen, morphine injection, ondansetron (ZOFRAN) IV, ondansetron Assessment/Plan: # Disposition Very difficult patient due to lack of desire to medical treatment. Patient does not have interest in communicating with the IM team.  Will consult Dr. Ninetta Lights for discussion of goal of care with patient since she knows her from outpatient management.   # Cough and shortness of breath: PCP and/or CAP  Refused Bronchoscopy  -Respiratory isolation for AFB  - Oxygen as needed  - Blood cultures x2, sputum culture, Gram stain and AFB, urine Legionella and strep antigen.  -- LDH normal, patient refused ABG>>> given her O2 sats of 94-98% on room, her PaO2 most likely above 70.  - On azithromycin, ceftriaxone and Bactrim per Dr. Aileen Pilot.  - IV normal saline at 100 cc/hr  - Patient has history of necrotizing pneumonia in June 2012 when she refused having bronchoscopy.   # Sepsis secondary to pneumonia:  Meets criteria for sepsis and responds to IVF therapy. Lactic  level 0.9- normal.  - Continue IVF hydration and follow closely>>> will likely continue it for hydration purpose given her decreased oral intake. .  - Antibiotic  started and blood cultures collected.   # oropharyngeal candidiasis  - will start Diflucan 100 mg IV daily x 7 days after discussing with pharmacist - end date established.  # AIDS: Last CD4< 30 and VL [id number]  Noncompliance with HIV medications for long time. She reports that she started to take her ART medication in last couple weeks. She refused pill or liquid form medications.  - CD4/VL pending.  - Continue her home HIV regimen>>>patient refused now .  - Patient shows no interest in talking to me or Dr. Allena Katz about her disease process.  - We will consult Dr. Ninetta Lights who had seen her a few times as an outpatient and he would discuss with patient about her goal of care>>>possible palliative care if she continues to refuse her medical treatment.    # Anemia of chronic disease, baseline 8-8.5  Stable.  # DVT prophylaxis: Lovenox.      LOS: 3 days   Paula Becker 06/26/2011, 9:13 AM

## 2011-06-27 ENCOUNTER — Inpatient Hospital Stay (HOSPITAL_COMMUNITY): Payer: Medicare Other

## 2011-06-27 ENCOUNTER — Ambulatory Visit: Payer: Medicare Other | Admitting: Internal Medicine

## 2011-06-27 DIAGNOSIS — J189 Pneumonia, unspecified organism: Secondary | ICD-10-CM

## 2011-06-27 DIAGNOSIS — Z21 Asymptomatic human immunodeficiency virus [HIV] infection status: Secondary | ICD-10-CM

## 2011-06-27 DIAGNOSIS — R918 Other nonspecific abnormal finding of lung field: Secondary | ICD-10-CM

## 2011-06-27 LAB — T-HELPER CELLS (CD4) COUNT (NOT AT ARMC): CD4 % Helper T Cell: 3 % — ABNORMAL LOW (ref 33–55)

## 2011-06-27 MED ORDER — EMTRICITABINE 10 MG/ML PO SOLN: 200.0000 mg | Freq: Every day | ORAL | Status: DC

## 2011-06-27 MED ORDER — RITONAVIR 80 MG/ML PO SOLN: 104.0000 mg | Freq: Every day | ORAL | Status: DC

## 2011-06-27 MED ORDER — ABACAVIR SULFATE 20 MG/ML PO SOLN: 600.0000 mg | Freq: Every day | ORAL | Status: DC

## 2011-06-27 NOTE — Progress Notes (Signed)
Internal Medicine Teaching Service Attending Note Date: 06/27/2011  Patient name: Paula Becker  Medical record number: [id number]  Date of birth: [birth year]    This patient has been seen and discussed with the house staff. Please see their note for complete details. I concur with their findings with the following additions/corrections: Paula Becker is to go for bronch in the AM. We will then transition to PO liquid ABX to cover PCP and CAP. We will arrange / continue the services that [facility] offer including med compliance, counseling, etc.  Lorenza Shakir 06/27/2011, 12:09 PM

## 2011-06-27 NOTE — Consult Note (Signed)
Referring physician: Lanora Manis   Chief Complaint  Patient presents with  . Chest Pain    HISTORY of PRESENT ILLNESS:  Paula Becker is a 36 y.o. female with hx of HIV admitted on 06/23/2011 with recurrent  Pneumonia.  PCCM consulted 5/28 for bronchoscopy.  She reports sweats, intermittent fever, and cough.  She denies chest pain.  There is no history of smoking, alcohol abuse, or illicit drug use.  She has not been compliant with her HIV therapy.  HIV1 RNA [id number], log 4.80 from 02/17/11.  CD4 count 30 from 02/17/11.  CT chest from 04/02/10>>prominent hilar nodes, tree in bud opacity b/l, confluent ASD with BTX RML and lingula CT chest 07/05/10>>diffuse emphysema, fibrosis, BTX, patchy tree in bud b/l, ASD LUL  Cultures:  Antibiotics: Zithromax 5/24>> Rocephin 5/24>>5/27 Bactrim 5/25>> Diflucan 5/27>>   Past Medical History  Diagnosis Date  . HIV (human immunodeficiency virus infection)   . MRSA (methicillin resistant staph aureus) culture positive   . Necrotizing pneumonia 07/2010  . Pneumonia 06/23/11    RLL patchy, nodular lung disease  . Anemia of chronic disease   . History of noncompliance with medical treatment     Past Surgical History  Procedure Date  . Cesarean section 1998  . Breast surgery 03/2010    right; "don't know what they did"    Family History  Problem Relation Age of Onset  . Lung disease Mother     passed away at 6 with lung disease  . Hypertension Father      reports that she has never smoked. She has never used smokeless tobacco. She reports that she does not drink alcohol or use illicit drugs.  Allergies  Allergen Reactions  . Shellfish-Derived Products Swelling    Facial swelling  . Vancomycin Rash    Medications Prior to Admission  Medication Sig Dispense Refill  . abacavir (ZIAGEN) 20 MG/ML solution Take 30 mLs (600 mg total) by mouth daily.  240 mL  2  . emtricitabine (EMTRIVA) 10 MG/ML solution Take 20 mLs (200 mg total) by mouth  daily.  600 mL  2  . ritonavir (NORVIR) 80 MG/ML solution Take 1.3 mLs (104 mg total) by mouth daily.  240 mL  2    ROS: Negative except above  PHYICAL EXAM:  Blood pressure 101/69, pulse 84, temperature 98.3 F (36.8 C), temperature source Oral, resp. rate 16, height 5' 9.5" (1.765 m), weight 115 lb 3.2 oz (52.254 kg), last menstrual period 05/31/2011, SpO2 96.00%. Body mass index is 16.77 kg/(m^2).  I/O last 3 completed shifts: In: 3798.3 [P.O.:440; I.V.:1798.3; IV Piggyback:1560] Out: 2050 [Urine:2050]  General - thin, non distress HEENT - perrla, eomi, no sinus tenderness, no lan Cardiac - s1s2 regular, no murmur Chest - no wheeze/rales Abd -soft, non-tender Ext - no edema Neuro - normal strength, A&O x 3 Psych - normal mood, behavior   Lab Results  Component Value Date   WBC 9.9 06/23/2011   HGB 8.3* 06/23/2011   HCT 27.1* 06/23/2011   MCV 72.7* 06/23/2011   PLT 400 06/23/2011  ,  Lab Results  Component Value Date   CREATININE 0.97 06/23/2011   BUN 17 06/23/2011   NA 136 06/23/2011   K 4.4 06/23/2011   CL 96 06/23/2011   CO2 26 06/23/2011  ,  Lab Results  Component Value Date   ALT 11 06/23/2011   AST 26 06/23/2011   ALKPHOS 117 06/23/2011   BILITOT 0.2* 06/23/2011  ,  Lab Results  Component Value Date   CKTOTAL 49 06/02/2010   CKMB 0.8 06/02/2010   Dg Chest Port 1 View  06/26/2011  *RADIOLOGY REPORT*  Clinical Data: Short of breath.  AIDS.  Pneumonia.  PORTABLE CHEST - 1 VIEW  Comparison: 06/23/2011  Findings: Bibasilar predominant pulmonary air space disease shows no significant change compared to prior exam. Tiny bilateral pleural effusions cannot be excluded.  Heart size remains normal.  IMPRESSION: Bibasilar predominant pulmonary air space disease, without significant interval change.  Original Report Authenticated By: Danae Orleans, M.D.    ASSESSMENT/PLAN:   36 yo female with hx of HIV with recurrent pneumonia.  She has acute on chronic process.  Plan: -Abx  per primary team and ID -will arrange for bronchscopy >> scheduled for 8 am on 5/29 -okay for her to eat today, but NPO after midnight -will arrange for non-contrast CT chest to assist with planning for bronchoscopy  Bronchoscopy procedure explained to patient.  Risks detailed as bleeding, infection, pneumothorax, and non-diagnosis.  Previn Jian 06/27/2011, 10:40 AM Pager:  [phone]

## 2011-06-27 NOTE — Progress Notes (Signed)
ANTIBIOTIC CONSULT NOTE - FOLLOW UP  Pharmacy Consult for Bactrim IV Indication: rule out pneumonia (CAP vs. PJP)  Allergies  Allergen Reactions  . Shellfish-Derived Products Swelling    Facial swelling  . Vancomycin Rash    Patient Measurements: Height: 5' 9.5" (176.5 cm) Weight: 115 lb 3.2 oz (52.254 kg) (bedscale) IBW/kg (Calculated) : 67.35   Vital Signs: Temp: 98.3 F (36.8 C) (05/27 2242) Temp src: Oral (05/27 2242) BP: 101/69 mmHg (05/28 0725) Pulse Rate: 84  (05/27 2242) Intake/Output from previous day: 05/27 0701 - 05/28 0700 In: 2558.3 [P.O.:340; I.V.:1698.3; IV Piggyback:520] Out: 700 [Urine:700] Intake/Output from this shift:    Labs: Pt refusing lad draws and testing.   Microbiology:  5/24 Blood cx x 2 NGTD 5/24 Urine cx insugnificant growth  D#5 azithromycin/ceftriaxone for PNA D#4 Septra (~ 5mg /kg q8h) for possible PJP D#2/7 Fluconazole for esophageal candidiasis   Assessment: 36 yo female with hx/o HIV non-adherence now presents with productive cough x 2 months & SOB x 2 weeks. She has not been taking ARV or Bactrim due to lack of desire and complaints of nausea. Afebrile, MRSA PCR (+), BCx ngtd x2, UCx non-significant.  Pharmacy unable to supply liquid forms of abacavir, emtricitabine or ritonavir. Patient will need to supply from home, but is refusing to do so. Refusing all oral meds and lab draws. Pt refuses bronch and BAL to eval for PNA. MD requesting psych consult today.  Goal of Therapy:  Renal dose adjustment of medications  Plan:  Continue Bactrim 320mg  IV q8h. Follow-up on HIV meds/psych consult. Pharmacy has little more to add to patient's care without patient cooperation.  **Consider SCDs for VTE prophylaxis.  Pt is refusing Lovenox injections.  Last dose given 5/24.  [employer], Pharm.D., BCPS Clinical Pharmacist Pager [phone] 06/27/2011 8:54 AM

## 2011-06-27 NOTE — Progress Notes (Addendum)
Subjective: I have spent > 30 minutes talking to her about the goal of care this morning, and patient agreed to have bronchoscopy and BAL. PCCM called and will schedule above procedures at 8 am tomorrow.   Patient also agreed to take the liquid form of her HIV medications. She agreed for staff to call her family to bring her home medications today.  Her father was contacted and would bring her medications.   Objective: Vital signs in last 24 hours: Filed Vitals:   06/26/11 1318 06/26/11 2242 06/27/11 0725 06/27/11 0805  BP: 95/63 100/64 101/69   Pulse: 87 84    Temp: 97 F (36.1 C) 98.3 F (36.8 C)    TempSrc: Axillary Oral    Resp: 18 16    Height:      Weight:    115 lb 3.2 oz (52.254 kg)  SpO2: 93% 96%     Weight change:   Intake/Output Summary (Last 24 hours) at 06/27/11 1115 Last data filed at 06/27/11 0926  Gross per 24 hour  Intake 2518.33 ml  Output    600 ml  Net 1918.33 ml   General: Very thin appearing, wasted  Mouth: oral thrush noted.  Neck: no JVD  Lungs: some rhonchi noted on right anterior lung, no wheezing or rales note. B/L posterior lungs sound diminished with scattered rales noted.  Heart: RRR, No M/G/R.  Ext: no edema  Psych: Oriented X3, memory intact for recent and remote, flat affect.  Lab Results: Basic Metabolic Panel:  Lab 06/23/11 2130  [address]  K 4.4  CL 96  CO2 26  GLUCOSE 108*  BUN 17  CREATININE 0.97  CALCIUM 9.1  MG --  PHOS --   Liver Function Tests:  Lab 06/23/11 1625  AST 26  ALT 11  ALKPHOS 117  BILITOT 0.2*  PROT 8.0  ALBUMIN 2.2*   CBC:  Lab 06/23/11 0947  WBC 9.9  NEUTROABS --  HGB 8.3*  HCT 27.1*  MCV 72.7*  PLT 400   Urinalysis:  Lab 06/24/11 0307  COLORURINE YELLOW  LABSPEC 1.012  PHURINE 6.5  GLUCOSEU NEGATIVE  HGBUR NEGATIVE  BILIRUBINUR NEGATIVE  KETONESUR NEGATIVE  PROTEINUR 30*  UROBILINOGEN 2.0*  NITRITE NEGATIVE  LEUKOCYTESUR TRACE*   Micro Results: Recent Results (from the past  240 hour(s))  CULTURE, BLOOD (ROUTINE X 2)     Status: Normal (Preliminary result)   Collection Time   06/23/11 11:16 AM      Component Value Range Status Comment   Specimen Description BLOOD RIGHT ARM   Final    Special Requests BOTTLES DRAWN AEROBIC AND ANAEROBIC 10CC   Final    Culture  Setup Time [id number]   Final    Culture     Final    Value:        BLOOD CULTURE RECEIVED NO GROWTH TO DATE CULTURE WILL BE HELD FOR 5 DAYS BEFORE ISSUING A FINAL NEGATIVE REPORT   Report Status PENDING   Incomplete   CULTURE, BLOOD (ROUTINE X 2)     Status: Normal (Preliminary result)   Collection Time   06/23/11 11:35 AM      Component Value Range Status Comment   Specimen Description BLOOD LEFT ARM   Final    Special Requests BOTTLES DRAWN AEROBIC AND ANAEROBIC 10CC   Final    Culture  Setup Time [id number]   Final    Culture     Final    Value:  BLOOD CULTURE RECEIVED NO GROWTH TO DATE CULTURE WILL BE HELD FOR 5 DAYS BEFORE ISSUING A FINAL NEGATIVE REPORT   Report Status PENDING   Incomplete   MRSA PCR SCREENING     Status: Abnormal   Collection Time   06/23/11  4:18 PM      Component Value Range Status Comment   MRSA by PCR POSITIVE (*) NEGATIVE  Final   URINE CULTURE     Status: Normal   Collection Time   06/24/11  3:07 AM      Component Value Range Status Comment   Specimen Description URINE, CLEAN CATCH   Final    Special Requests Immunocompromised   Final    Culture  Setup Time [id number]   Final    Colony Count 80,000 COLONIES/ML   Final    Culture     Final    Value: Multiple bacterial morphotypes present, none predominant. Suggest appropriate recollection if clinically indicated.   Report Status 06/25/2011 FINAL   Final    Studies/Results: Dg Chest Port 1 View  06/26/2011  *RADIOLOGY REPORT*  Clinical Data: Short of breath.  AIDS.  Pneumonia.  PORTABLE CHEST - 1 VIEW  Comparison: 06/23/2011  Findings: Bibasilar predominant pulmonary air space disease shows no significant  change compared to prior exam. Tiny bilateral pleural effusions cannot be excluded.  Heart size remains normal.  IMPRESSION: Bibasilar predominant pulmonary air space disease, without significant interval change.  Original Report Authenticated By: Danae Orleans, M.D.   Medications: I have reviewed the patient's current medications. Scheduled Meds:    . abacavir  600 mg Oral Daily  . azithromycin  500 mg Intravenous Q24H  . cefTRIAXone (ROCEPHIN)  IV  1 g Intravenous Q24H  . Chlorhexidine Gluconate Cloth  6 each Topical Q0600  . darunavir  800 mg Oral Q breakfast  . emtricitabine  200 mg Oral Daily  . enoxaparin  40 mg Subcutaneous Q24H  . fluconazole (DIFLUCAN) IV  100 mg Intravenous Q24H  . mirtazapine  15 mg Oral QHS  . mupirocin ointment  1 application Nasal BID  . ritonavir  104 mg Oral Daily  . sulfamethoxazole-trimethoprim  320 mg Intravenous Q8H   Continuous Infusions:    . sodium chloride 100 mL/hr at 06/26/11 2226   PRN Meds:.acetaminophen, acetaminophen, morphine injection, ondansetron (ZOFRAN) IV, ondansetron Assessment/Plan:  # Cough and shortness of breath: PCP and/or CAP , no change on repeat CXR on 06/26/11. Agreed Bronchoscopy and BAL>>>noncontrast chest CT today and Bronch at 8 am tomorrow.  -Respiratory isolation for AFB  - Oxygen as needed  - Blood cultures x2>>>No growth - Sputum culture, Gram stain and AFB>>>will obtain from her Bronch tomorrow  - urine Legionella and strep antigen>>>Negative -- LDH normal, patient refused ABG>>> given her O2 sats of 94-98% on room, her PaO2 most likely above 70.  - On azithromycin, ceftriaxone and Bactrim per Dr. Aileen Pilot >>>transition to oral liquid form in am.  - IV normal saline at 100 cc/hr   # Sepsis secondary to pneumonia:  Meets criteria for sepsis and responds to IVF therapy. Lactic level 0.9- normal.  - Continue IVF hydration and follow closely - see above  # Oropharyngeal candidiasis  - will start  Diflucan 100 mg IV daily x 7 days after discussing with pharmacist  - end date established.   # AIDS: Last CD4< 30 and VL 63724  Noncompliance with HIV medications for long time. She reports that she started to take her ART medication  in last couple weeks.   - CD4 30 /VL pending.  - Continue her home HIV regimen>>>father will bring meds from home.  - patient expressed interest in treatment. She is willing follow medical treatment plan today.  # medical noncompliance - No need for inpatient Psych consult for now since she is willing to cooperate with the medical treatment plan. - SW contacted for outpatient Psych set up.  # Anemia of chronic disease, baseline 8-8.5  Stable.   # DVT prophylaxis: Lovenox.       LOS: 4 days   Kamaile Zachow 06/27/2011, 11:15 AM

## 2011-06-27 NOTE — Progress Notes (Signed)
Pt refused all medications except IV antibiotics.  Dr. Paula Compton was notified.  Will continue to monitor.

## 2011-06-27 NOTE — Progress Notes (Signed)
Called pt's father to bring in pts HIV medication.  Pt's father said he would try to get her medication here within the next couple hours. Dr. Paula Compton notified.

## 2011-06-27 NOTE — Progress Notes (Signed)
Pt refused to get labs drawn.  Dr. Paula Compton notified and labs were rescheduled to in the AM.

## 2011-06-27 NOTE — Progress Notes (Signed)
INFECTIOUS DISEASE PROGRESS NOTE  ID: Paula Becker is a 36 y.o. female with   Principal Problem:  *Pneumonia Active Problems:  AIDS  Anemia of chronic disease  Sepsis  Subjective: Still has occas cough. productive  Abtx:  Anti-infectives     Start     Dose/Rate Route Frequency Ordered Stop   06/27/11 1430   ritonavir (NORVIR) 80 MG/ML solution 104 mg     Comments: Patient's own supply      104 mg Oral Daily 06/27/11 1407     06/27/11 1430   emtricitabine (EMTRIVA) 10 MG/ML solution 200 mg     Comments: Patient's own suppy      200 mg Oral Daily 06/27/11 1407     06/27/11 1430   abacavir (ZIAGEN) solution 600 mg     Comments: Patient's own supply      600 mg Oral Daily 06/27/11 1407     06/26/11 1030   fluconazole (DIFLUCAN) IVPB 100 mg        100 mg 50 mL/hr over 60 Minutes Intravenous Every 24 hours 06/26/11 0935 07/03/11 1029   06/24/11 1000   azithromycin (ZITHROMAX) 500 mg in dextrose 5 % 250 mL IVPB        500 mg 250 mL/hr over 60 Minutes Intravenous Every 24 hours 06/23/11 1507     06/24/11 1000   cefTRIAXone (ROCEPHIN) 1 g in dextrose 5 % 50 mL IVPB        1 g 100 mL/hr over 30 Minutes Intravenous Every 24 hours 06/23/11 1507     06/24/11 0944  sulfamethoxazole-trimethoprim (BACTRIM) 320 mg in dextrose 5 % 500 mL IVPB       320 mg 346.7 mL/hr over 90 Minutes Intravenous 3 times per day 06/24/11 0944     06/24/11 0700   Darunavir Ethanolate (PREZISTA) tablet 800 mg        800 mg Oral Daily with breakfast 06/23/11 1700     06/23/11 1800   abacavir (ZIAGEN) solution 600 mg  Status:  Discontinued     Comments: Patient's own supply      600 mg Oral Daily 06/23/11 1506 06/27/11 1407   06/23/11 1800   emtricitabine (EMTRIVA) 10 MG/ML solution 200 mg  Status:  Discontinued     Comments: Patient's own suppy      200 mg Oral Daily 06/23/11 1506 06/27/11 1407   06/23/11 1800   ritonavir (NORVIR) 80 MG/ML solution 104 mg  Status:  Discontinued     Comments:  Patient's own supply      104 mg Oral Daily 06/23/11 1506 06/27/11 1407   06/23/11 1300   sulfamethoxazole-trimethoprim (BACTRIM,SEPTRA) 200-40 MG/5ML suspension 40 mL  Status:  Discontinued        40 mL Oral Every 8 hours 06/23/11 1211 06/24/11 0943   06/23/11 1200   azithromycin (ZITHROMAX) 500 mg in dextrose 5 % 250 mL IVPB        500 mg 250 mL/hr over 60 Minutes Intravenous  Once 06/23/11 1148 06/23/11 1401   06/23/11 1200   cefTRIAXone (ROCEPHIN) 1 g in dextrose 5 % 50 mL IVPB        1 g 100 mL/hr over 30 Minutes Intravenous  Once 06/23/11 1148 06/23/11 1229   06/23/11 1200   sulfamethoxazole-trimethoprim (BACTRIM DS) 800-160 MG per tablet 2 tablet  Status:  Discontinued        2 tablet Oral  Once 06/23/11 1148 06/23/11 1211  Medications:  Scheduled:   . abacavir  600 mg Oral Daily  . azithromycin  500 mg Intravenous Q24H  . cefTRIAXone (ROCEPHIN)  IV  1 g Intravenous Q24H  . Chlorhexidine Gluconate Cloth  6 each Topical Q0600  . darunavir  800 mg Oral Q breakfast  . emtricitabine  200 mg Oral Daily  . enoxaparin  40 mg Subcutaneous Q24H  . fluconazole (DIFLUCAN) IV  100 mg Intravenous Q24H  . mirtazapine  15 mg Oral QHS  . mupirocin ointment  1 application Nasal BID  . ritonavir  104 mg Oral Daily  . sulfamethoxazole-trimethoprim  320 mg Intravenous Q8H  . DISCONTD: abacavir  600 mg Oral Daily  . DISCONTD: emtricitabine  200 mg Oral Daily  . DISCONTD: ritonavir  104 mg Oral Daily    Objective: Vital signs in last 24 hours: Temp:  [97.8 F (36.6 C)-98.3 F (36.8 C)] 97.8 F (36.6 C) (05/28 1300) Pulse Rate:  [81-84] 81  (05/28 1300) Resp:  [16-18] 18  (05/28 1300) BP: (93-101)/(60-69) 93/60 mmHg (05/28 1300) SpO2:  [96 %-97 %] 97 % (05/28 1300) Weight:  [52.254 kg (115 lb 3.2 oz)] 52.254 kg (115 lb 3.2 oz) (05/28 0805)   General appearance: alert and no distress Resp: rhonchi anterior - right Cardio: regular rate and rhythm GI: normal findings:  bowel sounds normal and soft, non-tender  Lab Results No results found for this basename: WBC:2,HGB:2,HCT:2,PLATELETS:2,NA:2,K:2,CL:2,CO2:2,BUN:2,CREATININE:2,GLU:2 in the last 72 hours Liver Panel No results found for this basename: PROT:2,ALBUMIN:2,AST:2,ALT:2,ALKPHOS:2,BILITOT:2,BILIDIR:2,IBILI:2 in the last 72 hours Sedimentation Rate No results found for this basename: ESRSEDRATE in the last 72 hours C-Reactive Protein No results found for this basename: CRP:2 in the last 72 hours  Microbiology: Recent Results (from the past 240 hour(s))  CULTURE, BLOOD (ROUTINE X 2)     Status: Normal (Preliminary result)   Collection Time   06/23/11 11:16 AM      Component Value Range Status Comment   Specimen Description BLOOD RIGHT ARM   Final    Special Requests BOTTLES DRAWN AEROBIC AND ANAEROBIC 10CC   Final    Culture  Setup Time [id number]   Final    Culture     Final    Value:        BLOOD CULTURE RECEIVED NO GROWTH TO DATE CULTURE WILL BE HELD FOR 5 DAYS BEFORE ISSUING A FINAL NEGATIVE REPORT   Report Status PENDING   Incomplete   CULTURE, BLOOD (ROUTINE X 2)     Status: Normal (Preliminary result)   Collection Time   06/23/11 11:35 AM      Component Value Range Status Comment   Specimen Description BLOOD LEFT ARM   Final    Special Requests BOTTLES DRAWN AEROBIC AND ANAEROBIC 10CC   Final    Culture  Setup Time [id number]   Final    Culture     Final    Value:        BLOOD CULTURE RECEIVED NO GROWTH TO DATE CULTURE WILL BE HELD FOR 5 DAYS BEFORE ISSUING A FINAL NEGATIVE REPORT   Report Status PENDING   Incomplete   MRSA PCR SCREENING     Status: Abnormal   Collection Time   06/23/11  4:18 PM      Component Value Range Status Comment   MRSA by PCR POSITIVE (*) NEGATIVE  Final   URINE CULTURE     Status: Normal   Collection Time   06/24/11  3:07 AM  Component Value Range Status Comment   Specimen Description URINE, CLEAN CATCH   Final    Special Requests Immunocompromised    Final    Culture  Setup Time [id number]   Final    Colony Count 80,000 COLONIES/ML   Final    Culture     Final    Value: Multiple bacterial morphotypes present, none predominant. Suggest appropriate recollection if clinically indicated.   Report Status 06/25/2011 FINAL   Final     Studies/Results: Dg Chest Port 1 View  06/26/2011  *RADIOLOGY REPORT*  Clinical Data: Short of breath.  AIDS.  Pneumonia.  PORTABLE CHEST - 1 VIEW  Comparison: 06/23/2011  Findings: Bibasilar predominant pulmonary air space disease shows no significant change compared to prior exam. Tiny bilateral pleural effusions cannot be excluded.  Heart size remains normal.  IMPRESSION: Bibasilar predominant pulmonary air space disease, without significant interval change.  Original Report Authenticated By: Danae Orleans, M.D.     Assessment/Plan: AIDS Recurrent Pneumonia Day 5 anbx My great appreciation to IMTS and CCM. Await her bronch/bal/bx in AM.  No change in anbx for now. DRVr/EMT/ABC She will f/u (possibly) in ID clinic after d/c....  Johny Sax Infectious Diseases [phone] 06/27/2011, 2:48 PM   LOS: 4 days

## 2011-06-27 NOTE — Progress Notes (Signed)
UR Completed. Denaly Gatling, RN, Nurse Case Manager [phone]     

## 2011-06-27 NOTE — Progress Notes (Signed)
Pt is first degree heart block this AM.  Pt was last noted being in SR. Dr. Paula Compton notified.

## 2011-06-28 ENCOUNTER — Encounter (HOSPITAL_COMMUNITY): Admission: EM | Discharge status: Against Medical Advice | Payer: Self-pay | Source: Home / Self Care

## 2011-06-28 ENCOUNTER — Inpatient Hospital Stay (HOSPITAL_COMMUNITY): Payer: Medicare Other

## 2011-06-28 HISTORY — PX: VIDEO BRONCHOSCOPY: SHX5072

## 2011-06-28 LAB — HIV-1 RNA QUANT-NO REFLEX-BLD
HIV 1 RNA Quant: 13560 copies/mL — ABNORMAL HIGH (ref <20)
HIV-1 RNA Quant, Log: 4.13 {Log} — ABNORMAL HIGH (ref <1.30)

## 2011-06-28 LAB — BODY FLUID CELL COUNT WITH DIFFERENTIAL
Lymphs, Fluid: 3 %
Monocyte-Macrophage-Serous Fluid: 13 % — ABNORMAL LOW (ref 50–90)
Neutrophil Count, Fluid: 80 % — ABNORMAL HIGH (ref 0–25)

## 2011-06-28 SURGERY — BRONCHOSCOPY, WITH FLUOROSCOPY
Anesthesia: Moderate Sedation | Laterality: Bilateral

## 2011-06-28 MED ORDER — DARUNAVIR ETHANOLATE 800 MG PO TABS: 800.0000 mg | ORAL_TABLET | Freq: Every day | ORAL | Status: DC

## 2011-06-28 MED ORDER — LIDOCAINE HCL 2 % EX GEL
CUTANEOUS | Status: DC | PRN
  Administered 2011-06-28: 1

## 2011-06-28 MED ORDER — SULFAMETHOXAZOLE-TRIMETHOPRIM 200-40 MG/5ML PO SUSP: 5.0000 mL | Freq: Four times a day (QID) | ORAL | Status: DC

## 2011-06-28 MED ORDER — MIRTAZAPINE 15 MG PO TABS: 15.0000 mg | ORAL_TABLET | Freq: Every day | ORAL | Status: DC

## 2011-06-28 MED ORDER — LEVOFLOXACIN 500 MG PO TABS: 500.0000 mg | ORAL_TABLET | Freq: Every day | ORAL | Status: AC

## 2011-06-28 MED ORDER — PHENYLEPHRINE HCL 0.5 % NA SOLN
NASAL | Status: DC | PRN
  Administered 2011-06-28: 1 [drp] via NASAL

## 2011-06-28 MED ORDER — MIDAZOLAM HCL 10 MG/2ML IJ SOLN
INTRAMUSCULAR | Status: AC
  Filled 2011-06-28: qty 4

## 2011-06-28 MED ORDER — MIDAZOLAM HCL 10 MG/2ML IJ SOLN
INTRAMUSCULAR | Status: DC | PRN
  Administered 2011-06-28 (×2): 2 mg via INTRAVENOUS

## 2011-06-28 MED ORDER — LIDOCAINE HCL (PF) 1 % IJ SOLN
INTRAMUSCULAR | Status: DC | PRN
  Administered 2011-06-28: 6 mL

## 2011-06-28 MED ORDER — FENTANYL CITRATE 0.05 MG/ML IJ SOLN
INTRAMUSCULAR | Status: AC
  Filled 2011-06-28: qty 4

## 2011-06-28 MED ORDER — FENTANYL CITRATE 0.05 MG/ML IJ SOLN
INTRAMUSCULAR | Status: DC | PRN
  Administered 2011-06-28 (×2): 50 ug via INTRAVENOUS

## 2011-06-28 MED ORDER — FLUCONAZOLE 100 MG PO TABS: 100.0000 mg | ORAL_TABLET | Freq: Every day | ORAL | Status: AC

## 2011-06-28 NOTE — Significant Event (Signed)
?   Of small Rt PTX after bronchoscopy.  Will f/u CXR at 2 pm today.  Coralyn Helling, MD [facility] Pulmonary/Critical Care 06/28/2011, 10:48 AM Pager:  [phone] After 3pm call: [phone]

## 2011-06-28 NOTE — Progress Notes (Addendum)
Nursing staff reported to me earlier that she refused all of liquid form HIV medications that her father brought from home. Patient also refused to try Zofran before taking her medications. She denies any pain/discomfort when swallowing.  Patient was off the unit for bronchoscopy/BAL/Biopsy at 8 am this morning.  I was able to talk to her at 11 am after she was back from the procedure. She was noted to be alert and talking to her nurse when I entered the room. She closed her eyes and avoided eye contact when I started to discuss with her about her treatment plan and asked the reasons why she refused her liquid form HIV medications and blood draw since yesterday. I further reminded her that she and me had an agreement yesterday that she would want our team to treat her because "I want to live"", and she would take her medications and allow blood draw. Patient continued to close her eyes and refused to communicate with me. I explained to her that she would really need to think about what she would prefer to do in terms of treatment for her extensive B/L pneumonia and poorly controlled HIV/AIDS. And I am willing to provide as much as assistance as possible. She raised her voice and shouted, "Get Out". I let her know that she could have her nurse page me when she would like to talk to me, and I left the room.  Nursing staff reported that patient wanted to go home in the early afternoon. I went to her room to talk to her with her father present at the bedside. I explained to her that her condition could get worse and the worst outcome could be death given her extensive B/L pneumonia, poorly controlled AIDS and a questionable small pneumothorax after her bronch. She is in her sound of mind and able to make informed decision from my assessment. She insisted to go home on [facility] today despite above discussion. Her father stated that this was not the first time she did this. And he was frustrated by her  disease and  behaviors.   Given her ongoing workup for ? TB, I have contacted our attending Dr. Rogelia Boga, ID Dr. Ninetta Lights, hospital infectious prevention team and health department with the assistance of unit SW. Patient can leave hospital on AMA since she has not definitive diagnosis of TB, and health department will follow up with her if her AFB cultures came back positive.  I give her prescriptions as listed below: 1. Levaquin 500 mg po daily x 7 days 2. Diflucan 100 mg po daily x 4 days 3. Bactrim DS 200/40/58ml, 5ml every 6 hours daily x 16 days  4. Remeron 15 mg po QHS 5. All of liquid HIV medications from home. 6. I will send a message for ID clinic to call her to make an appointment within one week.  Patient left hospital on AMA.

## 2011-06-28 NOTE — Progress Notes (Signed)
Pt requesting to see MD r/t going home.  Dr. Nedra Hai informed.  Instructed she is seeing pt at the moment but will come in to see pt.  Pt made aware of this.  Amanda Pea, RN

## 2011-06-28 NOTE — Progress Notes (Signed)
Video bronchoscopy intervention performed,  BAL intervention performed, Biopsy intervention performed.

## 2011-06-28 NOTE — Progress Notes (Signed)
57 Paula Becker pt. On isolation questionable TB wants to leave AMA.  Dr. Dierdre Searles paged, MD came to floor to talk to patient. Pt. Insisted of leaving AMA and infection prevention notified of what to do. Trey Paula from IP call back stated he talked with health care department and Dr. Alessandra Bevels said because we do not have enough information if pt. Have TB or not hospital can not keep her.  The health department have pt. contact information that  if pt.is Positive for TB health department will get in touch pt.. Pt. Iv and tele d/c, Pt. Leave AMA.

## 2011-06-28 NOTE — Progress Notes (Signed)
Pt iv infiltrated and still need iv diflucan which is being prepared by pharmacy at this time.  Non found on floor at 1030am.  Pt refused to have iv restarted.  States "I'm going home."  [employer], Charity fundraiser.

## 2011-06-28 NOTE — Progress Notes (Signed)
Clinical Social Work Department BRIEF PSYCHOSOCIAL ASSESSMENT 06/28/2011  Patient:  Paula Becker, Paula Becker     Account Number:  [id number]     Admit date:  06/23/2011  Clinical Social Worker:  Conley Simmonds  Date/Time:  06/28/2011 01:00 PM  Referred by:  Physician  Date Referred:  06/27/2011 Referred for  Behavioral Health Issues   Other Referral:   Interview type:  Patient Other interview type:    PSYCHOSOCIAL DATA Living Status:  ALONE Admitted from facility:   Level of care:   Primary support name:  Paula Becker Primary support relationship to patient:  PARENT Degree of support available:   Questionable    CURRENT CONCERNS Current Concerns  Other - See comment   Other Concerns:   compliance    SOCIAL WORK ASSESSMENT / PLAN CSW met with pt to discuss any possible concerns with regards to utilization of services as well as resource connection. Pt very guarded-relayed that she lives alone but has no difficulty with transportation/medication or any other needs.  CSW discussed referral to outpatient psych and or counseling services.  Pt is very guarded and declines any and all services that were offerred or presented.  CSW encouraged pt to contact inpatient or [facility] SW with any concerns should they arrive post d/c.   Assessment/plan status:  No Further Intervention Required Other assessment/ plan:   Information/referral to community resources:   None at this time    PATIENT'S/FAMILY'S RESPONSE TO PLAN OF CARE: Pt very withdrawn, guarded, not interested in any resources including mental health services and [facility]. Pt declined contacting OPCSW during f/u-  No further needs at this time  Paula Becker, [phone]

## 2011-06-28 NOTE — Progress Notes (Signed)
Pt c/o pain at iv site.  Refused to have another iv inserted for her ivf & ABX.  Noted slight tenderness to Rt. Arm iv site when touched.  Will cont. To monitor.  Amanda Pea, Charity fundraiser.

## 2011-06-28 NOTE — Progress Notes (Signed)
Pt left AMA.  Dr up on floor to see pt prior to leaving AMA.  Amanda Pea, Charity fundraiser.

## 2011-06-28 NOTE — Procedures (Signed)
Bronchoscopy Procedure Note Meggan Dhaliwal [id number] Jun 09, 1975  Procedure: Bronchoscopy Indications: 36 yo female with hx of HIV, low CD4 count and recurrent pneumonia  Procedure Details Consent: Risks of procedure as well as the alternatives and risks of each were explained to the (patient/caregiver).  Consent for procedure obtained. Time Out: Verified patient identification, verified procedure, site/side was marked, verified correct patient position, special equipment/implants available, medications/allergies/relevent history reviewed, required imaging and test results available.  Performed  In preparation for procedure, bronchoscope lubricated. Sedation: Benzodiazepines, opiates.  Airway entered and the following bronchi were examined: RUL, RML, RLL, LUL and LLL.   Procedures performed: Brushings performed Bronchoscope removed.    Evaluation Hemodynamic Status: BP stable throughout; O2 sats: stable throughout Patient's Current Condition: stable Specimens:  Sent. Complications: No apparent complications Patient did tolerate procedure well.   Given 4 mg versed and 100 mcg fentanyl IV for sedation and analgesia.  Bronchoscopy entered orally.  Vocal cords visualized with normal movement.  1% lidocaine instilled as needed for topical anesthesia of airways.  Trachea entered.  Carinal visualized.  Thick, white to yellow secretions eminating from both main bronchi.    Left main bronchus entered.  Lt upper, lingular, and lower lobe orifices visualized.  Friable mucosa, but no endobronchial lesions.  Right main bronchus entered.  Rt upper, middle, and lower lobe orifices visualized.  Friable, mucosa, but on endobronchial lesions.  Bronchoscope wedged into anterior segment of Rt upper lobe.  Instilled 60 ml of saline with approximately 25 ml of cloudy white to yellow fluid returned for BAL.  Then, using fluorscopic guidance, transbronchial biopsy x 4 obtained from right lower lobe.  Minimal  amount of bleeding resolved spontaneously.   Airways inspected, and then bronchoscope withdrawn.  Post-procedure chest xray pending.  Will send specimens for cell count with differential, gram stain and culture, AFB, fungal culture, DFA for PCP, viral culture, cytology.  Coralyn Helling, MD [facility] Pulmonary/Critical Care 06/28/2011, 8:41 AM Pager:  [phone] After 3pm call: [phone]

## 2011-06-28 NOTE — Progress Notes (Addendum)
Radiology call result of chest xray post broncioscopy;  Questionable  Of tiny R. Apical  Pneumothorax post bronchoscopy  Increase in opacity at lung bases is suspicious of infeciteous  Or inflammatory  Process. Dr. Conley Rolls paged awaiting MD call back.

## 2011-06-28 NOTE — Progress Notes (Signed)
Pt refuses to have AM labs drawn. MD made aware. Sharlene Dory, RN

## 2011-06-29 ENCOUNTER — Ambulatory Visit: Payer: Medicare Other | Admitting: Internal Medicine

## 2011-06-29 ENCOUNTER — Encounter (HOSPITAL_COMMUNITY): Payer: Self-pay | Admitting: Pulmonary Disease

## 2011-06-29 LAB — PATHOLOGIST SMEAR REVIEW

## 2011-06-29 LAB — CULTURE, BLOOD (ROUTINE X 2)
Culture  Setup Time: 201305241558
Culture: NO GROWTH

## 2011-06-30 NOTE — Discharge Summary (Signed)
Internal Medicine Teaching [facility] Discharge Note ###  This patient left hospital with AMA.###  Name: Paula Becker MRN: [id number] DOB: 25-Sep-1975 36 y.o.  Date of Admission: 06/23/2011  9:26 AM Date of Discharge: 06/28/2011 Attending Physician:Dr. Blanch Media  Discharge Diagnosis: 1. Cough and shortness of breath: PCP and/or CAP. 2. Sepsis 3. Oropharyngeal candidiasis  4. HIV/AIDS, poorly controlled 5. Medical noncompliance 6. Anemia 7. Depression   Discharge Medications: Medication List  As of 06/30/2011  6:18 AM   TAKE these medications         abacavir 20 MG/ML solution   Commonly known as: ZIAGEN   Take 30 mLs (600 mg total) by mouth daily.      Darunavir Ethanolate 800 MG tablet   Commonly known as: PREZISTA   Take 1 tablet (800 mg total) by mouth daily with breakfast.      emtricitabine 10 MG/ML solution   Commonly known as: EMTRIVA   Take 20 mLs (200 mg total) by mouth daily.      fluconazole 100 MG tablet   Commonly known as: DIFLUCAN   Take 1 tablet (100 mg total) by mouth daily.      levofloxacin 500 MG tablet   Commonly known as: LEVAQUIN   Take 1 tablet (500 mg total) by mouth daily.      mirtazapine 15 MG tablet   Commonly known as: REMERON   Take 1 tablet (15 mg total) by mouth at bedtime.      ritonavir 80 MG/ML solution   Commonly known as: NORVIR   Take 1.3 mLs (104 mg total) by mouth daily.      sulfamethoxazole-trimethoprim 200-40 MG/5ML suspension   Commonly known as: BACTRIM,SEPTRA   Take 5 mLs by mouth every 6 (six) hours.            Disposition and follow-up:   Ms.Ambra Stricker was discharged from [facility] in Stable condition.    Follow-up Appointments: ID clinic will contact patient for an appointment. patient left AMA. Please follow up with her Bronchoscopy and BAL results.    Consultations: Treatment Team:  Coralyn Helling, MD Dr. Johny Sax  Procedures Performed:  Dg Chest 2  View  06/23/2011  *RADIOLOGY REPORT*  Clinical Data: Back pain, chest pain and cough.  CHEST - 2 VIEW  Comparison: 06/13/2011  Findings: Two views of the chest demonstrates increased patchy densities in the right lower lung.  There are persistent patchy nodular densities in the left lung.  There appears to be chronic bronchiectasis and volume loss involving the right middle lobe. Negative for a pneumothorax.   Heart size is within normal limits.  IMPRESSION: Increased patchy and nodular opacities in the right lower lung. Findings are most compatible with an infectious etiology.  Chronic or persistent patchy disease in the left lung.  Chronic volume loss in the right middle lobe.  Original Report Authenticated By: Richarda Overlie, M.D.   Ct Chest Wo Contrast  06/27/2011  *RADIOLOGY REPORT*  Clinical Data: Recurrent pneumonia.  History of HIV.  CT CHEST WITHOUT CONTRAST  Technique:  Multidetector CT imaging of the chest was performed following the standard protocol without IV contrast.  Comparison: Chest radiograph 06/26/2011 and CT chest 06/06/2012and 04/02/2010  Findings: Lack of intravenous contrast limits the sensitivity for evaluating the hila and vascular structures.  Heart size is within normal limits.  Thoracic aorta contour and caliber are normal.  There are a few small scattered mediastinal lymph nodes.  No mediastinal, axillary, or  supraclavicular lymphadenopathy.  There is fullness in the hila bilaterally (on image number 31 on the right and image number 38 on the left, of the soft tissue windows).  Findings are suspicious for bihilar lymphadenopathy, increased from prior chest CT of June 2012.  There is stable, chronic and moderate bronchiectasis involving the right middle lobe.  There is chronic collapse/non-aeration of the right middle lobe.  Since the most recent chest CT of 07/06/2010, bronchiectasis involving both lower lobes has progressed significantly.  No significant bronchiectasis is identified in  the left upper lobe.  There is dense consolidation involving portions of both the left and right lower lobes, most prominent at the lung bases.  There are innumerable bilateral ill-defined tree-in-bud airspace opacities involving the upper and lower lobes bilaterally.  Areas of previously described airspace disease with cavitary change in the left upper lobe have resolved/decrease since the prior CT of June 2012.  No cavitary changes are seen on today's exam.  There are bilateral moderate-sized pleural effusions.  The trachea and mainstem bronchi are patent.  The esophagus is unremarkable.  Imaging of the upper abdomen is within normal limits.  No acute or suspicious bony abnormality.  IMPRESSION:  1.  Multilobar, bilateral tree-in-bud airspace opacities, with dense consolidation in both lower lobes.  The lower lobe airspace disease is new /worsened compared to the CT of June 2012, and there has been significant progression of bilateral lower lobe bronchiectasis since the prior study.  In this immunocompromised patient, tree-in-bud opacities and consolidation could reflect atypical, fungal, and / or bacterial pneumonia. 2.  Chronic right middle lobe bronchiectasis and collapse/scarring. 3.  Bilateral moderate pleural effusions, new compared to prior chest CT. 4.  Bilateral hilar lymphadenopathy appears increased from prior CT, with evaluation somewhat limited without intravenous contrast.  Original Report Authenticated By: Britta Mccreedy, M.D.   Dg Chest Port 1 View  06/28/2011  *RADIOLOGY REPORT*  Clinical Data: Status post bronchoscopy. Tuberculosis.  PORTABLE CHEST - 1 VIEW  Comparison: Chest 06/28/2011.  Findings: Bilateral airspace disease, worst in the right lower lung zone, persists and shows some progression.  There is no pneumothorax.  Small bilateral pleural effusions, greater on the right, again noted.  Heart size normal.  IMPRESSION:  1.  Negative for pneumothorax after bronchoscopy. 2.  Some increase  in bilateral airspace disease, worst in the right lower lung zone.  Original Report Authenticated By: Bernadene Bell. Maricela Curet, M.D.   Dg Chest Port 1 View  06/28/2011  *RADIOLOGY REPORT*  Clinical Data: Post bronchoscopy on the right  PORTABLE CHEST - 1 VIEW  Comparison: CT chest of 06/27/2011 and chest x-ray of 06/23/2011  Findings: There may be a tiny right apical pneumothorax present post bronchoscopy.  Follow-up is recommended.  Opacities at the lung bases have increased somewhat most likely infectious or inflammatory in etiology. A somewhat nodular reticular pattern is again noted throughout the lungs.  Heart size is stable.  IMPRESSION:  1.  Question of tiny right apical pneumothorax post bronchoscopy. 2.  Increase in opacities at the lung bases is suspicious for infectious or inflammatory process.  Original Report Authenticated By: Juline Patch, M.D.   Dg Chest Port 1 View  06/26/2011  *RADIOLOGY REPORT*  Clinical Data: Short of breath.  AIDS.  Pneumonia.  PORTABLE CHEST - 1 VIEW  Comparison: 06/23/2011  Findings: Bibasilar predominant pulmonary air space disease shows no significant change compared to prior exam. Tiny bilateral pleural effusions cannot be excluded.  Heart size remains normal.  IMPRESSION: Bibasilar predominant pulmonary air space disease, without significant interval change.  Original Report Authenticated By: Danae Orleans, M.D.   Dg C-arm Bronchoscopy  06/28/2011  CLINICAL DATA: bronchoscopy   C-ARM BRONCHOSCOPY  Fluoroscopy was utilized by the requesting physician.  No radiographic  interpretation.     Admission HPI:  This is a 36 year old woman with past medical history significant for poorly controlled HIV/AIDS (last CD4 30 and VL 63724 on 02/17/11), anemia of chronic disease, recurrent skin folliculitis/abscess and medical noncompliance, who presented with chronic cough for 2 month and shortness of breath for 2 weeks.  Patient states that she started to have productive cough  with moderate amount greenish sputum(average one-cup full daily) 2 months ago. She felt hot/chills with intermittent night sweating. She was not sure whether she had a fever or not because she did not take her temperature. She also reports intermittent mid-sternal and right chest pleuritic-like sharp pain associated with cough. Her chest pain was moderate to severe 8/10 without radiation. Cough makes it worse and resting makes it better. She denies sick contact, long distance travel or [place] travel recently. She did not seek any medical attention until mid April when she went to see Dr. Luciana Axe at the ID clinic. Her chest x-ray done at the clinic showed " persistent bilateral nodular airspace opacities could be due to pneumocystis pneumonia". She was given Bactrim for treatment of PCP pneumonia by Dr. Luciana Axe. However, she did not take the antibiotics due to lack of desire. She continues to have cough with greenish sputum and started to have shortness of breath 2 weeks ago. She came to ED today for further evaluation  Of note, she admits that she has been off her anti-retroviral therapy for several months or a year due to the lack of desire to take them. She states that she has started to take her ART medications for a couple weeks ago.  Denies appetite change or weight loss   Hospital Course by problem list:  # Cough and shortness of breath: PCP and/or CAP.    Patient presented with 27-month history of productive cough with 2-week progressive SOB. Given her poorly controlled HIV, 2 months of respiratory symptoms and CXR findings of bilateral opacities R>L, we could rule out CAP, PCP or TB. The respiratory isolation was initiated. Blood cultures were obtained. The Sputum specimen was not obtained due to her noncompliance. Ceftriaxone and azithromycin IV antibiotic therapy was started for the treatment of CAP. She was also treated with oral Bactrim initially which was switched to IV form due to her medical  noncompliant with oral medications.   Patient refused to take any oral medications including pill and liquid form medications. However, she was able to eat food without complain of esophgeal pain or nausea. She also refused most of her blood draws and declined some of her daily physical exams. She initially refused the Bronchoscopy and BAL, then agreed to have them done on 06/27/12. Patient insisted to go home after her bronchoscopy and BAL even though she has a questionable small right side pneumothorax. She was able to stay until a repeated CXR showed no pneumothorax. She left the hospital against medical advice even though she was told that she could suffer serious complications from her disease processes even death.   Given her ongoing workup for ? TB, I have contacted Dr. Rogelia Boga, ID Dr. Ninetta Lights, hospital infectious prevention team and health department with the assistance of unit SW. Patient can leave hospital on AMA since  she has not definitive diagnosis of TB, and health department will follow up with her if her AFB cultures came back positive.   I give her prescriptions as listed below:  1. Levaquin 500 mg po daily x 7 days  2. Diflucan 100 mg po daily x 4 days  3. Bactrim DS 200/40/22ml, 5ml every 6 hours daily x 16 days  4. Remeron 15 mg po QHS  5. All of liquid HIV medications from home.  6. I will send a message for ID clinic to call her to make an appointment within one week.   # Sepsis secondary to pneumonia:  The sepsis responded to IVF therapy. Lactic level 0.9- normal.   # Oropharyngeal candidiasis  Diflucan 100 mg IV daily was started.  # AIDS: Last CD4< 30 and VL 13560  Noncompliance with HIV medications for long time. She reports that she started to take her ART medication in last couple weeks.   # medical noncompliance  - SW contacted for outpatient Psych set up.   # Anemia  Iron deficiency anemia and Anemia of chronic disease, baseline 8-8.5  . # Depression, no  SI/HI  Remeron.     Discharge Vitals:  BP 94/63  Pulse 91  Temp(Src) 97 F (36.1 C) (Oral)  Resp 25  Ht 5' 9.5" (1.765 m)  Wt 116 lb 13.9 oz (53.01 kg)  BMI 17.01 kg/m2  SpO2 88%  LMP 05/31/2011  Discharge Labs: No results found for this or any previous visit (from the past 24 hour(s)).  Signed: Granger Chui 06/30/2011, 6:18 AM   Time Spent on Discharge: 60 minutes

## 2011-07-01 ENCOUNTER — Telehealth: Payer: Self-pay | Admitting: Infectious Diseases

## 2011-07-01 LAB — CULTURE, RESPIRATORY W GRAM STAIN

## 2011-07-01 NOTE — Telephone Encounter (Signed)
Called by lab- her BAL was + for MRSA (s-tet, bactrim).  She was sent home on bactrim. Has f/u no appts. I called pt and she has been feeling ok. Asked her to call clinic on 6-3 and make f/u appt which she agrees to do.

## 2011-07-04 ENCOUNTER — Telehealth: Payer: Self-pay | Admitting: *Deleted

## 2011-07-04 NOTE — Telephone Encounter (Signed)
Patient needs follow up appointment per Dr. Ninetta Lights.  Dr. Orvan Falconer has several available appointments on 07/11/11.  Called pt and left voice mail for her to call back and let us know if she wants a morning or afternoon appt. Wendall Mola CMA

## 2011-07-05 DIAGNOSIS — IMO0002 Reserved for concepts with insufficient information to code with codable children: Secondary | ICD-10-CM | POA: Diagnosis not present

## 2011-07-05 DIAGNOSIS — M79609 Pain in unspecified limb: Secondary | ICD-10-CM | POA: Diagnosis not present

## 2011-07-05 DIAGNOSIS — R209 Unspecified disturbances of skin sensation: Secondary | ICD-10-CM | POA: Diagnosis not present

## 2011-07-05 DIAGNOSIS — M65979 Unspecified synovitis and tenosynovitis, unspecified ankle and foot: Secondary | ICD-10-CM | POA: Diagnosis not present

## 2011-07-05 DIAGNOSIS — G609 Hereditary and idiopathic neuropathy, unspecified: Secondary | ICD-10-CM | POA: Diagnosis not present

## 2011-07-05 DIAGNOSIS — M659 Synovitis and tenosynovitis, unspecified: Secondary | ICD-10-CM | POA: Diagnosis not present

## 2011-07-05 DIAGNOSIS — M25579 Pain in unspecified ankle and joints of unspecified foot: Secondary | ICD-10-CM | POA: Diagnosis not present

## 2011-07-10 LAB — VIRAL CULTURE VIRC

## 2011-07-11 ENCOUNTER — Ambulatory Visit (INDEPENDENT_AMBULATORY_CARE_PROVIDER_SITE_OTHER): Payer: Medicare Other | Admitting: Internal Medicine

## 2011-07-11 ENCOUNTER — Other Ambulatory Visit: Payer: Self-pay | Admitting: *Deleted

## 2011-07-11 ENCOUNTER — Encounter: Payer: Self-pay | Admitting: Internal Medicine

## 2011-07-11 VITALS — BP 108/71 | HR 82 | Temp 98.3°F | Wt 117.0 lb

## 2011-07-11 DIAGNOSIS — B20 Human immunodeficiency virus [HIV] disease: Secondary | ICD-10-CM

## 2011-07-11 DIAGNOSIS — L281 Prurigo nodularis: Secondary | ICD-10-CM | POA: Insufficient documentation

## 2011-07-11 DIAGNOSIS — R21 Rash and other nonspecific skin eruption: Secondary | ICD-10-CM

## 2011-07-11 DIAGNOSIS — J189 Pneumonia, unspecified organism: Secondary | ICD-10-CM

## 2011-07-11 MED ORDER — ATOVAQUONE 750 MG/5ML PO SUSP: 1500.0000 mg | Freq: Every day | ORAL | Status: AC

## 2011-07-11 MED ORDER — DOXYCYCLINE MONOHYDRATE 25 MG/5ML PO SUSR: 100.0000 mg | Freq: Two times a day (BID) | ORAL | Status: DC

## 2011-07-11 MED ORDER — DARUNAVIR ETHANOLATE 800 MG PO TABS: 800.0000 mg | ORAL_TABLET | Freq: Every day | ORAL | Status: DC

## 2011-07-11 NOTE — Progress Notes (Signed)
Patient ID: Paula Becker, female   DOB: 08/08/1975, 36 y.o.   MRN: [id number]     [facility] for Infectious Disease  Patient Active Problem List  Diagnoses  . AIDS  . Anemia of chronic disease  . Cellulitis and abscess  . Severe protein-calorie malnutrition  . Unspecified episodic mood disorder  . Hypomagnesemia  . Pneumonia  . HSV (herpes simplex virus) anogenital infection  . Folliculitis  . Chronic cough  . Lung field abnormal finding on examination  . Noncompliance with medication treatment due to difficulty with route of medication administration  . Sepsis  . Rash of face  . Recurrent pneumonia    Patient's Medications  New Prescriptions   ATOVAQUONE (MEPRON) 750 MG/5ML SUSPENSION    Take 10 mLs (1,500 mg total) by mouth daily.   DOXYCYCLINE (VIBRAMYCIN) 25 MG/5ML SUSR    Take 20 mLs (100 mg total) by mouth 2 (two) times daily.  Previous Medications   ABACAVIR (ZIAGEN) 20 MG/ML SOLUTION    Take 30 mLs (600 mg total) by mouth daily.   EMTRICITABINE (EMTRIVA) 10 MG/ML SOLUTION    Take 20 mLs (200 mg total) by mouth daily.   MIRTAZAPINE (REMERON) 15 MG TABLET    Take 1 tablet (15 mg total) by mouth at bedtime.   RITONAVIR (NORVIR) 80 MG/ML SOLUTION    Take 1.3 mLs (104 mg total) by mouth daily.  Modified Medications   Modified Medication Previous Medication   DARUNAVIR ETHANOLATE (PREZISTA) 800 MG TABLET Darunavir Ethanolate (PREZISTA) 800 MG tablet      Take 1 tablet (800 mg total) by mouth daily with breakfast.    Take 1 tablet (800 mg total) by mouth daily with breakfast.  Discontinued Medications   SULFAMETHOXAZOLE-TRIMETHOPRIM (BACTRIM,SEPTRA) 200-40 MG/5ML SUSPENSION    Take 5 mLs by mouth every 6 (six) hours.    Subjective: Paula Becker is in for a hospital followup visit. She was hospitalized from May 24 through the 31st with recurrent pneumonia. She states that she had been having chest pains and new cough productive of green sputum. Her chest x-ray and CT scan  revealed new posterior lower lobe infiltrates with a tree in bud pattern. She underwent bronchoscopy but left AGAINST MEDICAL ADVICE before final results were available.  She states that she is feeling a little bit better. She is no longer having any chest pain and has not had any fever. She states she still has a productive cough with green sputum. She denies shortness of breath. She does not have her medications with her. She states that she is taking Ziagen, Emtriva and Norvir. She does not recognize the Prezista on the chart and does not know what it is for. She does not believe she has any of that at home. It sounds like she may be taking Phenergan but she is uncertain.  It sounds like she took trimethoprim sulfamethoxazole for a few days after she left the hospital but then she developed a pruritic rash on her face and stopped it.  Objective: Temp: 98.3 F (36.8 C) (06/11 1109) Temp src: Oral (06/11 1109) BP: 108/71 mmHg (06/11 1109) Pulse Rate: 82  (06/11 1109)  General: She is thin and does not appear to be in any distress Skin: She has some excoriated dark papules on her face and behind her ears Oral: Clear Lungs: Few scattered crackles posteriorly Cor: Regular S1 and S2 with no murmurs  Lab Results HIV 1 RNA Quant (copies/mL)  Date Value  06/23/2011 13560*  02/17/2011  16109*  09/12/2010 10500*     CD4 T Cell Abs (cmm)  Date Value  06/23/2011 30*  02/17/2011 30*  09/12/2010 40*     Assessment: Paula Becker has advanced HIV infection and chronic problems with adherence. This is complicated by recurrent pneumonia. Her BAL cultures grew MRSA. Her AFB stains were negative and AFB cultures are negative so far. Fungal cultures were negative and her pneumocystis antigen was negative as well. She may have an allergic reaction to trimethoprim sulfamethoxazole so I will switch her to doxycycline suspension. I will switch her to atovaquone suspension for her pneumocystis prophylaxis. She is  interested in the ESTEEM adherence study and I will have her meet with the study coordinator today. I asked her to bring all of her medications with her for all subsequent visits.  Plan: 1. Attempt to get her back on her proper antiretroviral regimen 2. Discontinue trimethoprim-sulfamethoxazole and start atovaquone 3. Start doxycycline 4. Followup within the next month   Cliffton Asters, MD [facility] [phone] pager   [phone] cell 07/11/2011, 11:41 AM

## 2011-07-12 ENCOUNTER — Telehealth: Payer: Self-pay | Admitting: Licensed Clinical Social Worker

## 2011-07-12 MED ORDER — DARUNAVIR ETHANOLATE 100 MG/ML PO SUSP: 800.0000 mg | Freq: Every day | ORAL | Status: DC

## 2011-07-12 NOTE — Telephone Encounter (Signed)
Patient also states that she can't take pills and has only been taking the regimen that is liquid. I advised her that she shouldn't take any of it if she will not take them all. She wants the Prezista in liquid or another medicine to replace the Prezista.

## 2011-07-12 NOTE — Telephone Encounter (Signed)
I will call the patient and let her know

## 2011-07-12 NOTE — Telephone Encounter (Signed)
Patient called stating that she forgot to mention to Dr. Orvan Falconer yesterday that Prezista causes her to diarrhea several times daily, and she stopped it in the past because of this. She wanted to know if there was something else that she could take besides Prezista. Please advise.

## 2011-07-12 NOTE — Telephone Encounter (Signed)
I will change the tablet form of Prezista to the liquid suspension.

## 2011-07-13 ENCOUNTER — Other Ambulatory Visit: Payer: Self-pay | Admitting: Podiatry

## 2011-07-13 DIAGNOSIS — D2370 Other benign neoplasm of skin of unspecified lower limb, including hip: Secondary | ICD-10-CM | POA: Diagnosis not present

## 2011-07-13 DIAGNOSIS — D485 Neoplasm of uncertain behavior of skin: Secondary | ICD-10-CM | POA: Diagnosis not present

## 2011-07-18 ENCOUNTER — Telehealth: Payer: Self-pay | Admitting: Licensed Clinical Social Worker

## 2011-07-18 NOTE — Telephone Encounter (Signed)
Please offer to move her appointment up to the first available one with Dr. Luciana Axe.

## 2011-07-18 NOTE — Telephone Encounter (Signed)
Patient called stating her face is still itching really bad with a red rash, also present on her back.

## 2011-07-20 DIAGNOSIS — IMO0002 Reserved for concepts with insufficient information to code with codable children: Secondary | ICD-10-CM | POA: Diagnosis not present

## 2011-07-24 LAB — FUNGUS CULTURE W SMEAR

## 2011-07-25 ENCOUNTER — Telehealth: Payer: Self-pay | Admitting: Internal Medicine

## 2011-07-25 NOTE — Telephone Encounter (Signed)
The BAL fungal cultures obtained when she was hospitalized recently grew a Zygomycetes species and Candida albicans. This was resulted after her discharge and after her last visit here on June 11. Her pneumonia symptoms were improving at that time after therapy for MRSA. I will not add any further coverage for fungus at this time. She has a followup visit next month.

## 2011-07-31 DIAGNOSIS — Z21 Asymptomatic human immunodeficiency virus [HIV] infection status: Secondary | ICD-10-CM | POA: Diagnosis not present

## 2011-08-07 ENCOUNTER — Encounter: Payer: Self-pay | Admitting: Internal Medicine

## 2011-08-07 ENCOUNTER — Ambulatory Visit (INDEPENDENT_AMBULATORY_CARE_PROVIDER_SITE_OTHER): Payer: Medicare Other | Admitting: Internal Medicine

## 2011-08-07 VITALS — BP 121/71 | HR 101 | Temp 97.9°F | Ht 69.0 in | Wt 120.0 lb

## 2011-08-07 DIAGNOSIS — J189 Pneumonia, unspecified organism: Secondary | ICD-10-CM

## 2011-08-07 DIAGNOSIS — B009 Herpesviral infection, unspecified: Secondary | ICD-10-CM

## 2011-08-07 DIAGNOSIS — A609 Anogenital herpesviral infection, unspecified: Secondary | ICD-10-CM

## 2011-08-07 MED ORDER — VALACYCLOVIR HCL 1 G PO TABS: 1000.0000 mg | ORAL_TABLET | Freq: Two times a day (BID) | ORAL | Status: DC

## 2011-08-07 MED ORDER — ACYCLOVIR 5 % EX CREA: 1.0000 "application " | TOPICAL_CREAM | CUTANEOUS | Status: DC

## 2011-08-07 NOTE — Progress Notes (Signed)
  Subjective:    Patient ID: Paula Becker, female    DOB: 1975/08/31, 36 y.o.   MRN: [id number]  HPI She comes in here for a complaint of a "boil" on her backside that has been there for a couple of weeks. She notes clear drainage in significant pain from the lesions. She also has complaints of multiple skin lesions on her back and her face. She does endorse poor compliance with her medication regimen. He is unaware of what the names of her medicines are.   Review of Systems  Skin: Positive for rash.       bulla       Objective:   Physical Exam  Skin:       She has opened bullous lesions on her lower gluteus on both sides that are areas that touch.  She also has numerous skin lesions on her back that are hyperpigmented and mildly papular.          Assessment & Plan:

## 2011-08-07 NOTE — Assessment & Plan Note (Signed)
I did have her stop her doxycycline now for the MRSA pneumonia. She is having no breathing problems or cough. He though was unaware of having been on it and it appears she was not taking it anyway.

## 2011-08-07 NOTE — Assessment & Plan Note (Signed)
I think her current lesions on her gluteus are consistent with herpes. They do touch on both sides and therefore I think it is a contact spread. I will give her Valtrex and acyclovir cream. I also did discuss with her at length that all of her skin lesions will not significantly improve until she takes her antiretroviral therapy. She will return in one month to see her primary provider. She has been off her medications and therefore I will have her see her primary provider but before assessing for the need for labs. I did give her a list of the medications that she is possibly on an advised her to take it on a daily basis.

## 2011-08-10 ENCOUNTER — Telehealth: Payer: Self-pay | Admitting: *Deleted

## 2011-08-10 NOTE — Telephone Encounter (Signed)
Patient called asking if she should still be on the Bactrim, it is no longer on her med list.  I believe she should still be on it, but it had an end date of 05/2011. Wendall Mola CMA

## 2011-08-10 NOTE — Telephone Encounter (Signed)
It looks like Dr. Orvan Falconer discontinued the  Bactrim, because she may have had an allergic reaction to it, he changed to Mepron.  I will let the patient know. Wendall Mola

## 2011-08-11 LAB — AFB CULTURE WITH SMEAR (NOT AT ARMC)

## 2011-08-20 ENCOUNTER — Emergency Department (HOSPITAL_COMMUNITY)
Admission: EM | Admit: 2011-08-20 | Discharge: 2011-08-20 | Disposition: A | Discharge status: Home / Self Care | Payer: Medicare Other | Attending: Emergency Medicine | Admitting: Emergency Medicine

## 2011-08-20 ENCOUNTER — Encounter (HOSPITAL_COMMUNITY): Payer: Self-pay | Admitting: Nurse Practitioner

## 2011-08-20 DIAGNOSIS — M79673 Pain in unspecified foot: Secondary | ICD-10-CM

## 2011-08-20 DIAGNOSIS — M79609 Pain in unspecified limb: Secondary | ICD-10-CM | POA: Insufficient documentation

## 2011-08-20 DIAGNOSIS — Z21 Asymptomatic human immunodeficiency virus [HIV] infection status: Secondary | ICD-10-CM | POA: Insufficient documentation

## 2011-08-20 DIAGNOSIS — Z8614 Personal history of Methicillin resistant Staphylococcus aureus infection: Secondary | ICD-10-CM | POA: Insufficient documentation

## 2011-08-20 DIAGNOSIS — M25579 Pain in unspecified ankle and joints of unspecified foot: Secondary | ICD-10-CM | POA: Diagnosis not present

## 2011-08-20 MED ORDER — HYDROCODONE-ACETAMINOPHEN 5-325 MG PO TABS: 1.0000 | ORAL_TABLET | Freq: Four times a day (QID) | ORAL | Status: AC | PRN

## 2011-08-20 MED ORDER — DOXYCYCLINE HYCLATE 100 MG PO CAPS: 100.0000 mg | ORAL_CAPSULE | Freq: Two times a day (BID) | ORAL | Status: AC

## 2011-08-20 MED ORDER — HYDROCODONE-ACETAMINOPHEN 5-325 MG PO TABS
1.0000 | ORAL_TABLET | Freq: Once | ORAL | Status: AC
  Administered 2011-08-20: 1 via ORAL
  Filled 2011-08-20: qty 1

## 2011-08-20 NOTE — ED Notes (Signed)
Pt presents to department for evaluation of R foot pain. Currently seeing specialist for fungus to foot. States pain is becoming worse, 5/10 at the time, becomes worse with walking. She is alert and oriented x4.

## 2011-08-20 NOTE — ED Provider Notes (Signed)
History  Scribed for Paula Jakes, MD, the patient was seen in room TR11C/TR11C. This chart was scribed by Candelaria Stagers. The patient's care started at 2:00PM.    CSN: [id number]  Arrival date & time 08/20/11  1224   First MD Initiated Contact with Patient 08/20/11 1348      Chief Complaint  Patient presents with  . Foot Pain     The history is provided by the patient.   Paula Becker is a 36 y.o. female who presents to the Emergency Department complaining of right foot pain to the bottom of her foot.  Pt reports that she was seen by a [facility] two to three weeks ago and is uncertain of the procedure that was done to her right foot.  She arrived in the ED wearing a boot.  Nothing seems to make the pain better or worse.     Past Medical History  Diagnosis Date  . HIV (human immunodeficiency virus infection)   . MRSA (methicillin resistant staph aureus) culture positive   . Necrotizing pneumonia 07/2010  . Pneumonia 06/23/11    RLL patchy, nodular lung disease  . Anemia of chronic disease   . History of noncompliance with medical treatment     Past Surgical History  Procedure Date  . Cesarean section 1998  . Breast surgery 03/2010    right; "don't know what they did"  . Video bronchoscopy 06/28/2011    Procedure: VIDEO BRONCHOSCOPY WITH FLUORO;  Surgeon: Coralyn Helling, MD;  Location: [facility] ENDOSCOPY;  Service: Cardiopulmonary;  Laterality: Bilateral;    Family History  Problem Relation Age of Onset  . Lung disease Mother     passed away at 2 with lung disease  . Hypertension Father     History  Substance Use Topics  . Smoking status: Never Smoker   . Smokeless tobacco: Never Used  . Alcohol Use: No    OB History    Grav Para Term Preterm Abortions TAB SAB Ect Mult Living                  Review of Systems  Constitutional: Negative for fever.  Respiratory: Negative for shortness of breath.   Cardiovascular: Negative for chest pain.  Gastrointestinal:  Negative for nausea, vomiting, abdominal pain and diarrhea.  Musculoskeletal: Positive for arthralgias (right foot pain).  Skin:       Callus on the bottom of the right foot.    All other systems reviewed and are negative.    Allergies  Shellfish-derived products and Vancomycin  Home Medications   Current Outpatient Rx  Name Route Sig Dispense Refill  . ACYCLOVIR 5 % EX CREA Topical Apply 1 application topically every 3 (three) hours. 5 g 0  . DARUNAVIR ETHANOLATE 100 MG/ML PO SUSP Oral Take 800 mg by mouth daily. 240 mL 11  . EMTRICITABINE 10 MG/ML PO SOLN Oral Take 20 mLs (200 mg total) by mouth daily. 600 mL 2  . RITONAVIR 80 MG/ML PO SOLN Oral Take 1.3 mLs (104 mg total) by mouth daily. 240 mL 2  . DOXYCYCLINE HYCLATE 100 MG PO CAPS Oral Take 1 capsule (100 mg total) by mouth 2 (two) times daily. 14 capsule 0  . HYDROCODONE-ACETAMINOPHEN 5-325 MG PO TABS Oral Take 1-2 tablets by mouth every 6 (six) hours as needed for pain. 10 tablet 0  . MIRTAZAPINE 15 MG PO TABS Oral Take 1 tablet (15 mg total) by mouth at bedtime. 30 tablet 0  BP 104/76  Pulse 101  Temp 98.7 F (37.1 C) (Oral)  Resp 18  SpO2 97%  LMP 07/23/2011  Physical Exam  Nursing note and vitals reviewed. Constitutional: She is oriented to person, place, and time. She appears well-developed and well-nourished. No distress.  HENT:  Head: Normocephalic and atraumatic.  Eyes: EOM are normal.  Cardiovascular: Normal rate and regular rhythm.   No murmur heard. Pulmonary/Chest: Breath sounds normal. She has no wheezes. She has no rales.  Abdominal: Bowel sounds are normal. There is no tenderness.  Musculoskeletal:       DP 1+.  Cap refill normal.  No swelling of the ankle.  Callus on the arch area with surrounding swelling of about 1 cm.  Mild warmth with no tenderness on palpation.    Neurological: She is alert and oriented to person, place, and time.  Skin: Skin is warm and dry. She is not diaphoretic.    Psychiatric: She has a normal mood and affect. Her behavior is normal.    ED Course  Procedures   DIAGNOSTIC STUDIES: Oxygen Saturation is 97% on room air, normal by my interpretation.    COORDINATION OF CARE:    Labs Reviewed - No data to display No results found.   1. Foot pain       MDM  The [facility] they injected something into the bottom of her foot to take care but now looks like a corn or heavy callus the patient states it was like a mole before. Now has increased pain and tenderness at that area some slight redness and increased warmth no evidence of an abscess will treat with pain medicine and antibiotic and have patient follow back up with podiatry clinic tomorrow. Patient's past medical history for HIV was noted.  I personally performed the services described in this documentation, which was scribed in my presence. The recorded information has been reviewed and considered.         Paula Jakes, MD 08/20/11 [phone]

## 2011-08-21 DIAGNOSIS — IMO0002 Reserved for concepts with insufficient information to code with codable children: Secondary | ICD-10-CM | POA: Diagnosis not present

## 2011-08-21 DIAGNOSIS — L02619 Cutaneous abscess of unspecified foot: Secondary | ICD-10-CM | POA: Diagnosis not present

## 2011-08-23 ENCOUNTER — Ambulatory Visit: Payer: Medicare Other | Admitting: Internal Medicine

## 2011-08-24 ENCOUNTER — Other Ambulatory Visit (HOSPITAL_COMMUNITY): Payer: Self-pay | Admitting: Podiatry

## 2011-08-24 DIAGNOSIS — L02619 Cutaneous abscess of unspecified foot: Secondary | ICD-10-CM

## 2011-08-25 ENCOUNTER — Telehealth (HOSPITAL_COMMUNITY): Payer: Self-pay

## 2011-08-25 NOTE — Telephone Encounter (Signed)
Tried to call pt yesterday

## 2011-08-25 NOTE — Telephone Encounter (Signed)
I spoke with Paula Becker.  She stated that she doesn't feel comfortable with having the procedure.  I advised her that I will let Dr. Dorisann Frames office know.

## 2011-09-11 ENCOUNTER — Ambulatory Visit: Payer: Medicare Other | Admitting: Infectious Diseases

## 2011-09-12 ENCOUNTER — Ambulatory Visit (INDEPENDENT_AMBULATORY_CARE_PROVIDER_SITE_OTHER): Payer: Medicare Other | Admitting: Infectious Diseases

## 2011-09-12 ENCOUNTER — Encounter: Payer: Self-pay | Admitting: Infectious Diseases

## 2011-09-12 VITALS — BP 113/74 | HR 109 | Temp 97.8°F | Ht 69.5 in | Wt 125.0 lb

## 2011-09-12 DIAGNOSIS — J189 Pneumonia, unspecified organism: Secondary | ICD-10-CM

## 2011-09-12 DIAGNOSIS — B20 Human immunodeficiency virus [HIV] disease: Secondary | ICD-10-CM

## 2011-09-12 LAB — CBC
MCV: 77 fL — ABNORMAL LOW (ref 78.0–100.0)
Platelets: 533 10*3/uL — ABNORMAL HIGH (ref 150–400)
RBC: 3.74 MIL/uL — ABNORMAL LOW (ref 3.87–5.11)
RDW: 16.3 % — ABNORMAL HIGH (ref 11.5–15.5)
WBC: 7.3 10*3/uL (ref 4.0–10.5)

## 2011-09-12 LAB — COMPREHENSIVE METABOLIC PANEL
ALT: <8 U/L (ref 0–35)
AST: 14 U/L (ref 0–37)
CO2: 32 mEq/L (ref 19–32)
Calcium: 9.1 mg/dL (ref 8.4–10.5)
Chloride: 99 mEq/L (ref 96–112)
Creat: 0.74 mg/dL (ref 0.50–1.10)
Sodium: 140 mEq/L (ref 135–145)
Total Bilirubin: 0.2 mg/dL — ABNORMAL LOW (ref 0.3–1.2)
Total Protein: 8.6 g/dL — ABNORMAL HIGH (ref 6.0–8.3)

## 2011-09-12 MED ORDER — DOXYCYCLINE MONOHYDRATE 25 MG/5ML PO SUSR: 100.0000 mg | Freq: Two times a day (BID) | ORAL | Status: AC

## 2011-09-12 NOTE — Assessment & Plan Note (Addendum)
Will recheck her labs, check genotype. She appears to be taking medications, knows doses and frequency (taking ABC only qday). Offered/refused condoms. Send for PAP

## 2011-09-12 NOTE — Progress Notes (Signed)
  Subjective:    Patient ID: Paula Becker, female    DOB: 20-Sep-1975, 36 y.o.   MRN: [id number]  HPI 36 yo F with hx of AIDS, non-compliance, HSV, recurrent PNA (last May 2013 with BAL MRSA+and  (-) for AFB, PCP, fungus) and recurent MRSA boils.Previously had rash to bactrim. Has been off doxy for awhile now. Today with problems with increased cough, prod of green sputum. Denies SOB. no fever or chills, has felt cold occasionally. Taking DRVr/EMT/ABC.  HIV 1 RNA Quant (copies/mL)  Date Value  06/23/2011 13560*  02/17/2011 63724*  09/12/2010 10500*     CD4 T Cell Abs (cmm)  Date Value  06/23/2011 30*  02/17/2011 30*  09/12/2010 40*      Review of Systems  Constitutional: Negative for fever, chills, appetite change and unexpected weight change.  Respiratory: Positive for cough. Negative for shortness of breath.   Cardiovascular: Positive for chest pain.  Gastrointestinal: Negative for diarrhea and constipation.  Genitourinary: Negative for dysuria.  Skin: Negative for rash.       Objective:   Physical Exam  Constitutional: She appears well-developed and well-nourished.  HENT:  Mouth/Throat: No oropharyngeal exudate.  Eyes: EOM are normal. Pupils are equal, round, and reactive to light.  Neck: Neck supple.  Cardiovascular: Normal rate, regular rhythm and normal heart sounds.   Pulmonary/Chest: Effort normal. No respiratory distress. She has decreased breath sounds. She has rhonchi.  Abdominal: Soft. Bowel sounds are normal. She exhibits no distension. There is no tenderness.  Musculoskeletal: She exhibits no edema.  Lymphadenopathy:    She has no cervical adenopathy.          Assessment & Plan:

## 2011-09-12 NOTE — Assessment & Plan Note (Signed)
Will restart her doxy. See her back in 2 weeks. Asked her to call if she has difficulty with getting/taking anbx, fever or chills, worsening breathing, increased cough.

## 2011-09-13 LAB — T-HELPER CELL (CD4) - (RCID CLINIC ONLY): CD4 T Cell Abs: 160 uL — ABNORMAL LOW (ref 400–2700)

## 2011-09-14 ENCOUNTER — Telehealth: Payer: Self-pay | Admitting: *Deleted

## 2011-09-14 LAB — HIV-1 RNA ULTRAQUANT REFLEX TO GENTYP+: HIV 1 RNA Quant: 231 copies/mL — ABNORMAL HIGH (ref <20)

## 2011-09-14 NOTE — Telephone Encounter (Signed)
Pt needing advice about cough.  RN reviewed last OV note from 09/12/11.  Seen by Dr. Ninetta Lights.  Diagnosed w/ recurrent pneumonia.  Started on doxycycline by Dr. Ninetta Lights.  Pt stated that she thought the doxycycline was for her foot.  RN advised that doxycycline is a "broad spectrum" antibiotic that will treat the pneumonia too.  RN advised pt to pick up the doxycycline prescription and take all of it.  Advised to increase fluid intake.  Advised that pt could take OTC robitussin/mucinex per the package instructions.  Pt has a return appt in 2 weeks for f/u.  Pt verbalized understanding.

## 2011-09-19 ENCOUNTER — Other Ambulatory Visit: Payer: Self-pay | Admitting: Infectious Diseases

## 2011-09-28 ENCOUNTER — Ambulatory Visit: Payer: Medicare Other | Admitting: Internal Medicine

## 2011-10-11 ENCOUNTER — Ambulatory Visit: Payer: Medicare Other | Admitting: Infectious Disease

## 2011-10-11 ENCOUNTER — Telehealth: Payer: Self-pay | Admitting: *Deleted

## 2011-10-11 NOTE — Telephone Encounter (Signed)
Productive cough w/ "green" sputum x 2 days.  Chest "hurts" when she coughs.  No fever.  Requesting appt.  Given appt for this afternoon w/ Dr. Daiva Eves.

## 2011-10-12 ENCOUNTER — Encounter: Payer: Self-pay | Admitting: Internal Medicine

## 2011-10-12 ENCOUNTER — Ambulatory Visit (INDEPENDENT_AMBULATORY_CARE_PROVIDER_SITE_OTHER): Payer: Medicare Other | Admitting: Internal Medicine

## 2011-10-12 ENCOUNTER — Telehealth: Payer: Self-pay | Admitting: *Deleted

## 2011-10-12 ENCOUNTER — Ambulatory Visit: Payer: Medicare Other

## 2011-10-12 ENCOUNTER — Encounter: Payer: Self-pay | Admitting: *Deleted

## 2011-10-12 VITALS — BP 100/70 | HR 121 | Temp 98.5°F | Ht 69.5 in | Wt 111.2 lb

## 2011-10-12 DIAGNOSIS — B20 Human immunodeficiency virus [HIV] disease: Secondary | ICD-10-CM

## 2011-10-12 DIAGNOSIS — Z23 Encounter for immunization: Secondary | ICD-10-CM

## 2011-10-12 DIAGNOSIS — J45909 Unspecified asthma, uncomplicated: Secondary | ICD-10-CM | POA: Insufficient documentation

## 2011-10-12 MED ORDER — MIRTAZAPINE 15 MG PO TABS: 15.0000 mg | ORAL_TABLET | Freq: Every day | ORAL | Status: DC

## 2011-10-12 MED ORDER — ALBUTEROL SULFATE HFA 108 (90 BASE) MCG/ACT IN AERS: 2.0000 | INHALATION_SPRAY | Freq: Four times a day (QID) | RESPIRATORY_TRACT | Status: DC | PRN

## 2011-10-12 NOTE — Progress Notes (Signed)
Patient ID: Paula Becker, female   DOB: 07-Mar-1975, 36 y.o.   MRN: [id number] RN spoke with [facility] Case Manager, Artis Delay.  Pt had previous been open for TAP w/ [facility].  Needing Case Management for medication non-compliance.  Pt also needs to be signed up for TAP again.  Fourteen pound weight loss in 2 weeks due to decreased appetite and nausea.  RN offered to supply [facility] with rx for Ensure for the pt.  [facility] Case Manager to check the status of the pt services.

## 2011-10-12 NOTE — Telephone Encounter (Signed)
Patient called and advised she has a cough with green sputum and shortness of breath when she coughs. She also advised she has pain in her chest and back intermittently. Patient wants to be seen today if possible. Checked the schedule and gave her an appt with Dr Orvan Falconer after speaking with him about the patient. She is coming in today at 1015 am, patient is aware of the appt.

## 2011-10-12 NOTE — Progress Notes (Signed)
Patient ID: Paula Becker, female   DOB: 12/23/1975, 36 y.o.   MRN: [id number]     [facility] for Infectious Disease  Patient Active Problem List  Diagnosis  . AIDS  . Anemia of chronic disease  . Cellulitis and abscess  . Severe protein-calorie malnutrition  . Unspecified episodic mood disorder  . Hypomagnesemia  . Pneumonia  . HSV (herpes simplex virus) anogenital infection  . Folliculitis  . Chronic cough  . Lung field abnormal finding on examination  . Noncompliance with medication treatment due to difficulty with route of medication administration  . Sepsis  . Rash of face  . Recurrent pneumonia  . Asthmatic bronchitis    Patient's Medications  New Prescriptions   ALBUTEROL (PROVENTIL HFA;VENTOLIN HFA) 108 (90 BASE) MCG/ACT INHALER    Inhale 2 puffs into the lungs every 6 (six) hours as needed for wheezing.  Previous Medications   ABACAVIR (ZIAGEN) 20 MG/ML SOLUTION    Take 8 mg/kg by mouth 2 (two) times daily.   ACYCLOVIR CREAM (ZOVIRAX) 5 %    Apply 1 application topically every 3 (three) hours.   DARUNAVIR ETHANOLATE (PREZISTA) 100 MG/ML SUSP    Take 800 mg by mouth daily.   EMTRIVA 10 MG/ML SOLUTION    TAKE 20 ML BY MOUTH DAILY   RITONAVIR (NORVIR) 80 MG/ML SOLUTION    Take 1.3 mLs (104 mg total) by mouth daily.  Modified Medications   Modified Medication Previous Medication   MIRTAZAPINE (REMERON) 15 MG TABLET mirtazapine (REMERON) 15 MG tablet      Take 1 tablet (15 mg total) by mouth at bedtime.    Take 1 tablet (15 mg total) by mouth at bedtime.  Discontinued Medications   No medications on file    Subjective: Paula Becker is seen on a work in basis. She is complaining of recurrent cough over the last week. She also states that she is still having chest pain and pain under her right breast. She denies any fever, shortness of breath or any problem with new boils. She states that she stopped taking all of her HIV medication after seeing Dr. Ninetta Lights last month. She  says that she stopped taking it because she lost her appetite. It appears that she has not been on the Remeron for some time now. She cannot recall when she last took it. She states that she is feeling more depressed. She denies any thoughts of hurting herself. When I asked her what we might do to help her take her medications more consistently she responded " I need a coach". She lives with her 24 year old son who is aware of her HIV status and medications. Her father lives nearby. She agrees to talk to them to see if they will help assist her in taking her medications.  Objective: Temp: 98.5 F (36.9 C) (09/12 1018) Temp src: Oral (09/12 1018) BP: 100/70 mmHg (09/12 1025) Pulse Rate: 121  (09/12 1025)  General: She does not appear to be in any distress Skin: No acute rash Breasts: She has a healed scar on her right breast without any signs of active inflammation Lungs: Scattered wheezes Cor: Regular S1-S2 no murmurs Abdomen: Soft and nontender  Lab Results HIV 1 RNA Quant (copies/mL)  Date Value  09/12/2011 231*  06/23/2011 13560*  02/17/2011 63724*     CD4 T Cell Abs (cmm)  Date Value  09/12/2011 160*  06/23/2011 30*  02/17/2011 30*     Assessment: Her HIV infection has come under good control  when she was taking her 4 drug regimen last month. She seemed happy and surprised when she saw her lab results. I encouraged her to restart her medications and take them consistently every day. I also encouraged her to talk to her family and enlist their support and helping her take her medications. I will also make a referral to [facility] and to [facility] of the [place] for mental health counseling. I've also refilled her Remeron and asked her to start taking it again at bedtime.  When I asked her what she thought was causing her chest and breast pain she said that she thought it was probably do to her brought being too tight. I suggested that she try a larger size bra.  She  tells me that she used to use an albuterol inhaler before moving here several years ago. She probably has recurrent asthmatic bronchitis and I will give her an inhaler to use now.  Plan: 1. Restart antiretroviral medications and Remeron 2. Albuterol inhaler 3. Referral for case management and mental health counseling 4. CD4 and viral load today to establish a new baseline 5. Followup within one month   Cliffton Asters, MD [facility] for Infectious Disease [facility] [phone] pager   [phone] cell 10/12/2011, 12:11 PM

## 2011-10-12 NOTE — Telephone Encounter (Signed)
Unable to reach pt at phone number listed.  Disconnected.  Will mail appointments to the pt for Thurs., Sept 19 at 1:30PM w/ Franne Forts and 2:45PM w/ Dr. Daiva Eves.

## 2011-10-19 ENCOUNTER — Ambulatory Visit: Payer: Medicare Other | Admitting: Infectious Disease

## 2011-10-19 ENCOUNTER — Ambulatory Visit: Payer: Medicare Other

## 2011-10-24 ENCOUNTER — Telehealth: Payer: Self-pay | Admitting: Licensed Clinical Social Worker

## 2011-10-24 NOTE — Telephone Encounter (Signed)
Patient complains of stomach pains after taking her hiv medications, symptoms started 1 month ago. Patient states she has some loose stools, denies diarrhea and nausea. I advised the patient to take her medications with food and if symptoms increase to call back. The patient has no showed several appointments for this same concern including last week.

## 2011-10-24 NOTE — Telephone Encounter (Signed)
Patient called wanting to know if a referral was made for a nurse to come to her home to help her with taking her medications. I called the [place] office and left a message on Cindy McBane's voicemail([phone]) to call me or Annice Pih to set up a referral.

## 2011-10-26 ENCOUNTER — Ambulatory Visit: Payer: Medicare Other | Admitting: Internal Medicine

## 2011-11-30 ENCOUNTER — Encounter: Payer: Self-pay | Admitting: Internal Medicine

## 2011-11-30 ENCOUNTER — Ambulatory Visit (INDEPENDENT_AMBULATORY_CARE_PROVIDER_SITE_OTHER): Payer: Medicare Other | Admitting: Internal Medicine

## 2011-11-30 VITALS — BP 116/73 | HR 108 | Temp 97.9°F | Ht 69.5 in | Wt 114.0 lb

## 2011-11-30 DIAGNOSIS — B20 Human immunodeficiency virus [HIV] disease: Secondary | ICD-10-CM

## 2011-11-30 NOTE — Assessment & Plan Note (Signed)
She was again counseled on the need to take her medications daily without missed doses. I counseled her on the risk of death was continuing to not take care of herself. I also advised her that she will continue to feel bad for several months while she does take her medications though certainly will never get better if she does not start. She voiced her understanding.  I will get her CD4 count and viral load today for a baseline in she will return in one month. She does have a home health nurse to help her with her medications though understands that she has to actually take them and it is her responsibility to take the medications. More than 30 minutes spent in counseling patient and with coordination of care.

## 2011-11-30 NOTE — Progress Notes (Signed)
  Subjective:    Patient ID: Paula Becker, female    DOB: [birth year], 36 y.o.   MRN: [id number]  HPI She comes in here for followup of her HIV. She was seen one month ago to 2 a complaint of a lesion on her backside. It was noted during the visit that she had stopped her antiretroviral medicines. She was to restart them then and get lab work but did not get any labs done.  She tells me she has intermittently taken her medicines since last visit. He has been visited by a home health nurse to help with her medications. She was previously getting her medications organized by her father who has stopped.  She continues to have rash on her body and feels that she has some nausea related to her medicines.   Review of Systems  Constitutional: Positive for appetite change and fatigue. Negative for fever and unexpected weight change.  HENT: Negative for sore throat and trouble swallowing.   Respiratory: Positive for cough. Negative for shortness of breath and wheezing.   Cardiovascular: Negative for leg swelling.  Gastrointestinal: Negative for nausea, abdominal pain and diarrhea.  Musculoskeletal: Negative for myalgias, joint swelling and arthralgias.  Skin: Positive for rash.  Hematological: Negative for adenopathy.       Objective:   Physical Exam  Constitutional:       Thin and chronically ill-appearing  HENT:  Mouth/Throat: Oropharynx is clear and moist. No oropharyngeal exudate.  Cardiovascular: Normal rate, regular rhythm and normal heart sounds.  Exam reveals no gallop and no friction rub.   No murmur heard. Pulmonary/Chest: Effort normal and breath sounds normal. No respiratory distress. She has no wheezes. She has no rales.  Abdominal: Soft. Bowel sounds are normal. She exhibits no distension. There is no tenderness.          Assessment & Plan:

## 2011-12-01 LAB — T-HELPER CELL (CD4) - (RCID CLINIC ONLY): CD4 T Cell Abs: 50 uL — ABNORMAL LOW (ref 400–2700)

## 2011-12-01 LAB — HIV-1 RNA QUANT-NO REFLEX-BLD: HIV-1 RNA Quant, Log: 4.46 {Log} — ABNORMAL HIGH (ref <1.30)

## 2011-12-26 ENCOUNTER — Other Ambulatory Visit: Payer: Self-pay | Admitting: Infectious Diseases

## 2011-12-27 ENCOUNTER — Telehealth: Payer: Self-pay | Admitting: *Deleted

## 2011-12-27 NOTE — Telephone Encounter (Signed)
Patient called and advised she was told that her medication was denied by her homecare nurse and wanted to which one was denied. Advised her we have no record of that and she can talk to the doctor at her next visit in a week.

## 2012-01-02 ENCOUNTER — Encounter: Payer: Self-pay | Admitting: Internal Medicine

## 2012-01-02 ENCOUNTER — Ambulatory Visit (INDEPENDENT_AMBULATORY_CARE_PROVIDER_SITE_OTHER): Payer: Medicare Other | Admitting: Internal Medicine

## 2012-01-02 VITALS — BP 120/80 | HR 94 | Temp 98.1°F | Ht 69.0 in | Wt 124.0 lb

## 2012-01-02 DIAGNOSIS — B20 Human immunodeficiency virus [HIV] disease: Secondary | ICD-10-CM

## 2012-01-02 LAB — COMPLETE METABOLIC PANEL WITH GFR
ALT: 11 U/L (ref 0–35)
AST: 26 U/L (ref 0–37)
Albumin: 3.2 g/dL — ABNORMAL LOW (ref 3.5–5.2)
Alkaline Phosphatase: 103 U/L (ref 39–117)
BUN: 12 mg/dL (ref 6–23)
Chloride: 99 mEq/L (ref 96–112)
Potassium: 4.4 mEq/L (ref 3.5–5.3)
Sodium: 137 mEq/L (ref 135–145)
Total Protein: 9.1 g/dL — ABNORMAL HIGH (ref 6.0–8.3)

## 2012-01-02 LAB — CBC WITH DIFFERENTIAL/PLATELET
Basophils Absolute: 0 10*3/uL (ref 0.0–0.1)
Basophils Relative: 1 % (ref 0–1)
Lymphocytes Relative: 39 % (ref 12–46)
MCHC: 32 g/dL (ref 30.0–36.0)
Neutro Abs: 2.5 10*3/uL (ref 1.7–7.7)
Neutrophils Relative %: 42 % — ABNORMAL LOW (ref 43–77)
RDW: 21.3 % — ABNORMAL HIGH (ref 11.5–15.5)
WBC: 6.1 10*3/uL (ref 4.0–10.5)

## 2012-01-02 MED ORDER — EMTRICITABINE 10 MG/ML PO SOLN: 240.0000 mg | Freq: Every day | ORAL | Status: DC

## 2012-01-02 MED ORDER — SULFAMETHOXAZOLE-TRIMETHOPRIM 200-40 MG/5ML PO SUSP: 20.0000 mL | ORAL | Status: DC

## 2012-01-02 MED ORDER — ORAL SYRINGES MISC: 20.0000 mL | Status: DC

## 2012-01-02 NOTE — Progress Notes (Signed)
  Subjective:    Patient ID: Paula Becker, female    DOB: [birth year], 36 y.o.   MRN: [id number]  HPI Comes in here for followup of her HIV. I saw her the go and she had been off of medications. She did decide to start medications then and she comes in here one month into her medications. She is here with her nurse aide who confirms that she is taking her medication. She is on all solution therapy since she is unable to take pills. She tells me she does feel that her overall and she has gained some weight.   Review of Systems  Constitutional: Negative for activity change and appetite change.  HENT: Negative for sore throat and trouble swallowing.   Gastrointestinal: Negative for diarrhea.  Skin: Negative for rash.  Neurological: Negative for dizziness and headaches.  Psychiatric/Behavioral: The patient is not nervous/anxious.        Objective:   Physical Exam  Constitutional:       Thin and chronically ill appearing  Cardiovascular:       Tachycardic and regular rhythm  Lymphadenopathy:    She has no cervical adenopathy.          Assessment & Plan:

## 2012-01-02 NOTE — Assessment & Plan Note (Addendum)
He has multiple mutations and long history of noncompliance. I will recheck her virus today with a genotype and she will return in one month. She'll be called sooner if there are concerns on the genotype.  Did discuss with her the possibility of more resistance mutations based on her history.

## 2012-01-03 LAB — HIV-1 RNA ULTRAQUANT REFLEX TO GENTYP+
HIV 1 RNA Quant: 968 copies/mL — ABNORMAL HIGH (ref <20)
HIV-1 RNA Quant, Log: 2.99 {Log} — ABNORMAL HIGH (ref <1.30)

## 2012-01-03 LAB — T-HELPER CELL (CD4) - (RCID CLINIC ONLY): CD4 T Cell Abs: 170 uL — ABNORMAL LOW (ref 400–2700)

## 2012-01-05 NOTE — Progress Notes (Signed)
HPI: Paula Becker is a 36 y.o. female who presented for here HIV follow up.  Allergies: Allergies  Allergen Reactions  . Shellfish-Derived Products Swelling    Facial swelling  . Vancomycin Rash    Vitals:    Past Medical History: Past Medical History  Diagnosis Date  . HIV (human immunodeficiency virus infection)   . MRSA (methicillin resistant staph aureus) culture positive   . Necrotizing pneumonia 07/2010  . Pneumonia 06/23/11    RLL patchy, nodular lung disease  . Anemia of chronic disease   . History of noncompliance with medical treatment     Social History: History   Social History  . Marital Status: Single    Spouse Name: N/A    Number of Children: N/A  . Years of Education: N/A   Social History Main Topics  . Smoking status: Never Smoker   . Smokeless tobacco: Never Used  . Alcohol Use: No  . Drug Use: No  . Sexually Active: No     Comment: declined condoms   Other Topics Concern  . None   Social History Narrative   Lives in [place] in her house.Has support from her Dad for doctor visits.Didn't work ever. Not working now.    ART mutations: RT mutations = K101E, V179F, Y181C, G190A PR mutations = M36I  Current Regimen: Darunavir/r + FTC + ABC  Labs: HIV 1 RNA Quant (copies/mL)  Date Value  01/02/2012 968*  11/30/2011 29047*  09/12/2011 231*     CD4 T Cell Abs (cmm)  Date Value  01/02/2012 170*  11/30/2011 50*  09/12/2011 160*     Hep B S Ab (no units)  Date Value  04/15/2010 NEG      Hepatitis B Surface Ag (no units)  Date Value  04/15/2010 NEGATIVE      HCV Ab (no units)  Date Value  04/15/2010 NEGATIVE     CrCl: Estimated Creatinine Clearance: 63.3 ml/min (by C-G formula based on Cr of 1.08).  Lipids:    Component Value Date/Time   CHOL 120 02/17/2011 1254   TRIG 119 02/17/2011 1254   HDL 27* 02/17/2011 1254   CHOLHDL 4.4 02/17/2011 1254   VLDL 24 02/17/2011 1254   LDLCALC 69 02/17/2011 1254    Assessment: Pt has a long  history of noncompliance. She decided to be back on therapy about a month ago with the above regimen. She can only take liquid formulation. Her CD4 is still <200. I talked to her about the need to take her meds consistently and she will need to take septra for PCP prophylaxis. She wanted the septra to also be in the liquid form so that is ok. I asked her whether she prefers to take it daily or three times a week. She preffered to take it 3 times a week.   Recommendations: Cont DRV/r + FTC + ABC Genotype testing Septra 20ml PO TTS  Clide Cliff, PharmD Clinical Infectious Disease Pharmacist [facility] 01/05/2012, 3:35 PM

## 2012-01-08 ENCOUNTER — Other Ambulatory Visit: Payer: Self-pay | Admitting: *Deleted

## 2012-01-08 ENCOUNTER — Other Ambulatory Visit: Payer: Self-pay | Admitting: Internal Medicine

## 2012-01-08 ENCOUNTER — Telehealth: Payer: Self-pay | Admitting: *Deleted

## 2012-01-08 DIAGNOSIS — M549 Dorsalgia, unspecified: Secondary | ICD-10-CM

## 2012-01-08 DIAGNOSIS — B370 Candidal stomatitis: Secondary | ICD-10-CM

## 2012-01-08 DIAGNOSIS — R062 Wheezing: Secondary | ICD-10-CM

## 2012-01-08 MED ORDER — FLUCONAZOLE 100 MG PO TABS: 100.0000 mg | ORAL_TABLET | Freq: Every day | ORAL | Status: DC

## 2012-01-08 MED ORDER — ALBUTEROL SULFATE HFA 108 (90 BASE) MCG/ACT IN AERS: 2.0000 | INHALATION_SPRAY | Freq: Four times a day (QID) | RESPIRATORY_TRACT | Status: DC | PRN

## 2012-01-08 NOTE — Telephone Encounter (Signed)
Patient's nurse Chrissy called stating she has bilateral wheezing and thrush, requesting refill on her inhaler and Rx for thrush.  Per Dr. Ninetta Lights ok for this and Rxs sent to [facility].  Nurse will also be picking up a dental referral as patient is having dental pain. Wendall Mola CMA

## 2012-01-25 ENCOUNTER — Other Ambulatory Visit: Payer: Self-pay | Admitting: Infectious Diseases

## 2012-01-29 ENCOUNTER — Ambulatory Visit: Payer: Medicare Other | Attending: Internal Medicine

## 2012-01-29 DIAGNOSIS — M25659 Stiffness of unspecified hip, not elsewhere classified: Secondary | ICD-10-CM | POA: Insufficient documentation

## 2012-01-29 DIAGNOSIS — M545 Low back pain, unspecified: Secondary | ICD-10-CM | POA: Insufficient documentation

## 2012-01-29 DIAGNOSIS — IMO0001 Reserved for inherently not codable concepts without codable children: Secondary | ICD-10-CM | POA: Insufficient documentation

## 2012-01-29 DIAGNOSIS — R5381 Other malaise: Secondary | ICD-10-CM | POA: Insufficient documentation

## 2012-02-01 ENCOUNTER — Other Ambulatory Visit: Payer: Medicare Other

## 2012-02-06 ENCOUNTER — Ambulatory Visit: Payer: Medicare Other | Admitting: Physical Therapy

## 2012-02-06 ENCOUNTER — Other Ambulatory Visit (INDEPENDENT_AMBULATORY_CARE_PROVIDER_SITE_OTHER): Payer: Medicare Other

## 2012-02-06 DIAGNOSIS — B20 Human immunodeficiency virus [HIV] disease: Secondary | ICD-10-CM

## 2012-02-07 LAB — T-HELPER CELL (CD4) - (RCID CLINIC ONLY): CD4 % Helper T Cell: 6 % — ABNORMAL LOW (ref 33–55)

## 2012-02-07 LAB — HIV-1 RNA QUANT-NO REFLEX-BLD: HIV 1 RNA Quant: 352 copies/mL — ABNORMAL HIGH (ref <20)

## 2012-02-12 ENCOUNTER — Ambulatory Visit: Payer: Medicare Other | Attending: Internal Medicine | Admitting: Physical Therapy

## 2012-02-15 ENCOUNTER — Ambulatory Visit (INDEPENDENT_AMBULATORY_CARE_PROVIDER_SITE_OTHER): Payer: Medicare Other | Admitting: Internal Medicine

## 2012-02-15 ENCOUNTER — Encounter: Payer: Self-pay | Admitting: Internal Medicine

## 2012-02-15 VITALS — BP 109/71 | HR 99 | Temp 98.2°F | Ht 69.0 in | Wt 129.0 lb

## 2012-02-15 DIAGNOSIS — B20 Human immunodeficiency virus [HIV] disease: Secondary | ICD-10-CM

## 2012-02-15 NOTE — Progress Notes (Signed)
  Subjective:    Patient ID: Paula Becker, female    DOB: 07-Dec-1975, 37 y.o.   MRN: [id number]  HPI She comes in here for follow up of HIV. She continues on Ziagen, darunavir, norvir and emtricitabine.  She continues to take her medications in solution form and reports excellent compliance, particularly since having a nurse available to help her with her medications. She also continues to take Bactrim in solution every Monday Wednesday and Friday. Her labs show good compliance in her CD4 count now is 220, the highest it has been in several years. Her virus is also under 500 copies. She feels better and has gained weight. No new issues   Review of Systems  Constitutional: Negative for fever, appetite change, fatigue and unexpected weight change.  HENT: Negative for sore throat and trouble swallowing.   Respiratory: Negative for cough and shortness of breath.   Gastrointestinal: Negative for abdominal pain, diarrhea and constipation.  Musculoskeletal: Negative for myalgias and arthralgias.  Skin: Negative for rash.  Neurological: Negative for dizziness and headaches.       Objective:   Physical Exam  Constitutional: No distress.       Chronically ill-appearing  Cardiovascular: Normal rate, regular rhythm and normal heart sounds.  Exam reveals no gallop and no friction rub.   No murmur heard. Pulmonary/Chest: Effort normal and breath sounds normal. No respiratory distress. She has no wheezes. She has no rales.          Assessment & Plan:

## 2012-02-15 NOTE — Assessment & Plan Note (Signed)
She is doing much better and she is very pleased with her progress. She is pleased with the regimen and will continue. I will have her return in about 3 months to follow up on her labs and assure she is doing well. I did remind her that she can call at any time and be seen if she has any other issues

## 2012-02-22 ENCOUNTER — Other Ambulatory Visit: Payer: Self-pay | Admitting: Infectious Diseases

## 2012-03-15 ENCOUNTER — Other Ambulatory Visit: Payer: Self-pay | Admitting: Infectious Diseases

## 2012-04-13 ENCOUNTER — Other Ambulatory Visit: Payer: Self-pay | Admitting: Infectious Diseases

## 2012-04-25 ENCOUNTER — Other Ambulatory Visit: Payer: Medicare Other

## 2012-05-01 ENCOUNTER — Telehealth: Payer: Self-pay | Admitting: *Deleted

## 2012-05-01 NOTE — Telephone Encounter (Signed)
Yes, stop, we can change to pills at her visit.

## 2012-05-01 NOTE — Telephone Encounter (Signed)
Per Rolanda Jay, RN, patient is off her medications (at least 2 weeks) and this is why she no-showed her lab appointment.  She moved to a different apartment and her father is no longer assisting her with medications and food (he now lives in [place]).  Patient is interested in starting therapy in pill form because of the difficulty she has with pulling up her liquid medication into syringes.  Chrissy has taught her how to pull up the medication, but patient states that she "cannot do it." Patient has an appointment 05/07/12.  Chrissy would like know how to advise the patient to take her medication until her appointment. Andree Coss, RN

## 2012-05-01 NOTE — Telephone Encounter (Signed)
I notified Paula Becker - she will tell the patient.

## 2012-05-07 ENCOUNTER — Ambulatory Visit (INDEPENDENT_AMBULATORY_CARE_PROVIDER_SITE_OTHER): Payer: Medicare Other | Admitting: Internal Medicine

## 2012-05-07 ENCOUNTER — Encounter: Payer: Self-pay | Admitting: Internal Medicine

## 2012-05-07 VITALS — BP 104/68 | HR 87 | Temp 98.2°F | Ht 69.5 in | Wt 125.0 lb

## 2012-05-07 DIAGNOSIS — B20 Human immunodeficiency virus [HIV] disease: Secondary | ICD-10-CM

## 2012-05-07 MED ORDER — EMTRICITABINE-TENOFOVIR DF 200-300 MG PO TABS: 1.0000 | ORAL_TABLET | Freq: Every day | ORAL | Status: DC

## 2012-05-07 MED ORDER — ABACAVIR SULFATE-LAMIVUDINE 600-300 MG PO TABS: 1.0000 | ORAL_TABLET | Freq: Every day | ORAL | Status: DC

## 2012-05-07 MED ORDER — DOLUTEGRAVIR SODIUM 50 MG PO TABS: 50.0000 mg | ORAL_TABLET | Freq: Every day | ORAL | Status: DC

## 2012-05-07 NOTE — Assessment & Plan Note (Addendum)
She continues to have small supple difficulties and now is interested in a pill regimen. We'll try tivicay with Epzicom she can crush.  She is going to start now and check labs in 2 weeks and follow up in 3 weeks.   She will continue with bactrim prophylaxis

## 2012-05-07 NOTE — Progress Notes (Addendum)
HPI: Paula Becker is a 37 y.o. female presenting for follow up for HIV. She has a long Hx of poor compliance and presents wanting to change from liquid ART to crushable tablets.  Allergies: Allergies  Allergen Reactions  . Shellfish-Derived Products Swelling    Facial swelling  . Vancomycin Rash    Vitals: Temp: 98.2 F (36.8 C) (04/08 0921) Temp src: Oral (04/08 0921) BP: 104/68 mmHg (04/08 0921) Pulse Rate: 87 (04/08 0921)  Past Medical History: Past Medical History  Diagnosis Date  . HIV (human immunodeficiency virus infection)   . MRSA (methicillin resistant staph aureus) culture positive   . Necrotizing pneumonia 07/2010  . Pneumonia 06/23/11    RLL patchy, nodular lung disease  . Anemia of chronic disease   . History of noncompliance with medical treatment     Social History: History   Social History  . Marital Status: Single    Spouse Name: N/A    Number of Children: N/A  . Years of Education: N/A   Social History Main Topics  . Smoking status: Never Smoker   . Smokeless tobacco: Never Used  . Alcohol Use: No  . Drug Use: No  . Sexually Active: No     Comment: declined condoms   Other Topics Concern  . None   Social History Narrative   Lives in [place] in her house.   Has support from her Dad for doctor visits.   Didn't work ever. Not working now.    Current Regimen: Emtricitabine, abacavir, darunavir, ritonavir  Labs: HIV 1 RNA Quant (copies/mL)  Date Value  02/06/2012 352*  01/02/2012 968*  11/30/2011 19147*     CD4 T Cell Abs (cmm)  Date Value  02/06/2012 220*  01/02/2012 170*  11/30/2011 50*     Hep B S Ab (no units)  Date Value  04/15/2010 NEG      Hepatitis B Surface Ag (no units)  Date Value  04/15/2010 NEGATIVE      HCV Ab (no units)  Date Value  04/15/2010 NEGATIVE     CrCl: Estimated Creatinine Clearance: 63.2 ml/min (by C-G formula based on Cr of 1.08).  Lipids:    Component Value Date/Time   CHOL 120 02/17/2011 1254   TRIG 119 02/17/2011 1254   HDL 27* 02/17/2011 1254   CHOLHDL 4.4 02/17/2011 1254   VLDL 24 02/17/2011 1254   LDLCALC 69 02/17/2011 1254    Assessment: 37 yo F with exstensive Hx on non-adherence. She has a nurse who helps with medications weekly and until recently her father was helping administer meds but he has moved. At this time she would like to change to crushable tablets due to inability to measure liquids on her own.  Spoke to patient about changing to Dolutegravir + Truvada but she has used Truvada previously and did not want to restart due to taste. Spoke about alternatives and methods to take medications.  Recommendations: Change to Dolutegravir + Truvada.  She will try to cut up tablets and take with oatmeal or grits (does not like apple sauce). If this doesn't work nurse will crush up meds and put in baggies for her to take with V8 juice.   Drue Stager, PharmD [facility] 05/07/2012, 4:03 PM

## 2012-05-07 NOTE — Addendum Note (Signed)
Addended by: Gardiner Barefoot on: 05/07/2012 04:27 PM   Modules accepted: Orders

## 2012-05-07 NOTE — Progress Notes (Signed)
  Subjective:    Patient ID: Paula Becker, female    DOB: 11/25/1975, 37 y.o.   MRN: [id number]  HPI She comes in for followup of her HIV. She has a long history of poor compliance due to multiple issues and most recently though had started doing well on liquid medication with Norvir, Prezista, emtricitabine and abacavir however she moved and is no longer living with her father who was preparing the liquid medication for her. She therefore has been off her medication again. Her last CD4 count was 220 as it was increasing and her viral load was responding well at the time. Unfortunately, she now needs to start a new regimen and is interested in having a pill regimen which she can crush. She feels that she is able to take small pills or at least cut pills into smaller pieces that she can swallow. She is here with her and the nurse is helping her as well. She also has a herpes lesion on her left lower lip. She has no other complaints. Her creatinine is in normal limits.   Review of Systems     Objective:   Physical Exam        Assessment & Plan:

## 2012-05-09 ENCOUNTER — Telehealth: Payer: Self-pay | Admitting: *Deleted

## 2012-05-09 NOTE — Telephone Encounter (Signed)
Ok.  Please let the pharmacy know that it is only tivicay and epzicom, no truvada.  Thanks  Maybe there is a pharmacy that waives the copay?  [facility]?

## 2012-05-09 NOTE — Telephone Encounter (Signed)
Rolanda Jay, RN, called to notify us that she helped the patient picked up her medication (pharmacy gave her the Tivicay with Epzicom and Truvada).  Patient decided that she wouldn't try the Truvada because of her previous experience with the taste.  Chrissy crushed a week's worth of medications (Tivicay and Epzicom) and put them into labeled baggies.  Chrissy reports that the patient is upset that she was charged approximately $7 for the Tivicay.  Pt also refused to take the first dose because she "didn't have any oatmeal." Andree Coss, RN

## 2012-05-13 ENCOUNTER — Ambulatory Visit: Payer: Medicare Other

## 2012-05-22 DIAGNOSIS — Z21 Asymptomatic human immunodeficiency virus [HIV] infection status: Secondary | ICD-10-CM | POA: Diagnosis not present

## 2012-05-24 ENCOUNTER — Telehealth: Payer: Self-pay | Admitting: *Deleted

## 2012-05-24 ENCOUNTER — Other Ambulatory Visit: Payer: Self-pay | Admitting: Internal Medicine

## 2012-05-24 DIAGNOSIS — A6000 Herpesviral infection of urogenital system, unspecified: Secondary | ICD-10-CM

## 2012-05-24 MED ORDER — ACYCLOVIR 5 % EX OINT: 1.0000 "application " | TOPICAL_OINTMENT | CUTANEOUS | Status: DC

## 2012-05-24 NOTE — Telephone Encounter (Signed)
Sent, thanks

## 2012-05-24 NOTE — Telephone Encounter (Signed)
Spoke with pt about previously using zovirax cream for herpes outbreak last summer.  Dr. Luciana Axe prescribed zovirax cream at that time.  She stated that the cream worked well for the outbreak.

## 2012-05-27 ENCOUNTER — Telehealth: Payer: Self-pay | Admitting: Licensed Clinical Social Worker

## 2012-05-27 NOTE — Telephone Encounter (Signed)
Received prior authorization for zovirax cream, I called medicaid and they are faxing the form for MD to sign and fax back.

## 2012-05-28 ENCOUNTER — Ambulatory Visit (INDEPENDENT_AMBULATORY_CARE_PROVIDER_SITE_OTHER): Payer: Medicare Other | Admitting: Internal Medicine

## 2012-05-28 ENCOUNTER — Telehealth: Payer: Self-pay | Admitting: Licensed Clinical Social Worker

## 2012-05-28 ENCOUNTER — Ambulatory Visit: Payer: Medicare Other

## 2012-05-28 ENCOUNTER — Encounter: Payer: Self-pay | Admitting: Internal Medicine

## 2012-05-28 VITALS — BP 107/68 | HR 96 | Temp 98.2°F | Ht 69.0 in | Wt 127.0 lb

## 2012-05-28 DIAGNOSIS — B20 Human immunodeficiency virus [HIV] disease: Secondary | ICD-10-CM

## 2012-05-28 MED ORDER — SULFAMETHOXAZOLE-TRIMETHOPRIM 200-40 MG/5ML PO SUSP: 20.0000 mL | ORAL | Status: DC

## 2012-05-28 MED ORDER — ABACAVIR SULFATE-LAMIVUDINE 600-300 MG PO TABS: 1.0000 | ORAL_TABLET | Freq: Every day | ORAL | Status: DC

## 2012-05-28 MED ORDER — DOLUTEGRAVIR SODIUM 50 MG PO TABS: 50.0000 mg | ORAL_TABLET | Freq: Every day | ORAL | Status: DC

## 2012-05-28 NOTE — Progress Notes (Signed)
  Subjective:    Patient ID: Paula Becker, female    DOB: 06/22/1975, 37 y.o.   MRN: [id number]  HPI He comes in here for followup of her HIV. She is on tivicay and Epzicom and initially was doing well however stopped taking her medications daily because she does not like the taste of the crushed medications. She does not like foods that she can put it in. She takes both medications crushed in one bag together. She feels that if she takes the medications crushed separately, that she will not have such difficulty. She gives me a list of the days she is taking her medications and of the last 2 weeks she has only taken it about 3 times. She has not taken any opportunistic infection prophylaxis because she does not want to add any other pills. She understands the importance of taking the medications.   Review of Systems  Constitutional: Negative for fever and fatigue.  HENT: Negative for sore throat and trouble swallowing.   Respiratory: Negative for cough and shortness of breath.   Gastrointestinal: Negative for diarrhea.  Skin: Negative for rash.       Objective:   Physical Exam  Constitutional:  Chronically ill appearing  Cardiovascular: Normal rate, regular rhythm and normal heart sounds.   No murmur heard.         Assessment & Plan:

## 2012-05-28 NOTE — Assessment & Plan Note (Signed)
She has not taken her medications so there is no indication to check her labs. I will have her return in 2 weeks. I did discuss with her the importance of medications. She was also visited by the pharmacist to emphasize medication compliance and ways to take her crushed medications. She tells me she is going to start taking her medications daily by using 2 separate bags. She will return in 2 weeks and I will check labs at that time if she is taking her medications.

## 2012-05-28 NOTE — Progress Notes (Signed)
HPI: Paula Becker is a 37 y.o. female here for HIV follow up. She has a long Hx of poor adherence. Her medications were changed last appointment. She has not had any adverse effects but has only taken her medications ~1/3 of the time. She is unable to swallow pills and can't measure liquids reliably so she is currently crushing her medications.  Allergies: Allergies  Allergen Reactions  . Shellfish-Derived Products Swelling    Facial swelling  . Vancomycin Rash    Vitals: Temp: 98.2 F (36.8 C) (04/29 1035) Temp src: Oral (04/29 1035) BP: 107/68 mmHg (04/29 1035) Pulse Rate: 96 (04/29 1035)  Past Medical History: Past Medical History  Diagnosis Date  . HIV (human immunodeficiency virus infection)   . MRSA (methicillin resistant staph aureus) culture positive   . Necrotizing pneumonia 07/2010  . Pneumonia 06/23/11    RLL patchy, nodular lung disease  . Anemia of chronic disease   . History of noncompliance with medical treatment     Social History: History   Social History  . Marital Status: Single    Spouse Name: N/A    Number of Children: N/A  . Years of Education: N/A   Social History Main Topics  . Smoking status: Never Smoker   . Smokeless tobacco: Never Used  . Alcohol Use: No  . Drug Use: No  . Sexually Active: No     Comment: declined condoms   Other Topics Concern  . None   Social History Narrative   Lives in [place] in her house.   Has support from her Dad for doctor visits.   Didn't work ever. Not working now.    Current Regimen: Epzicom, dolutegravir +Bactrim  Labs: HIV 1 RNA Quant (copies/mL)  Date Value  02/06/2012 352*  01/02/2012 968*  11/30/2011 16109*     CD4 T Cell Abs (cmm)  Date Value  02/06/2012 220*  01/02/2012 170*  11/30/2011 50*     Hep B S Ab (no units)  Date Value  04/15/2010 NEG      Hepatitis B Surface Ag (no units)  Date Value  04/15/2010 NEGATIVE      HCV Ab (no units)  Date Value  04/15/2010 NEGATIVE      CrCl: Estimated Creatinine Clearance: 64.2 ml/min (by C-G formula based on Cr of 1.08).  Lipids:    Component Value Date/Time   CHOL 120 02/17/2011 1254   TRIG 119 02/17/2011 1254   HDL 27* 02/17/2011 1254   CHOLHDL 4.4 02/17/2011 1254   VLDL 24 02/17/2011 1254   LDLCALC 69 02/17/2011 1254    Assessment: 37 yo F with extensive Hx of non-adherence. She states that the medication tastes bad crushed and is why she misses most doses. She does not have any issue remembering as she writes down wether or not she took the med everyday. I reinforced why she needs to take medications daily and we discussed more food options for mixing the drug in. Home health nurse that sees her weekly is fed up and doesn't know what to do about her adherence. Psychiatric help is what she really needs as she is not committed to her own care.  Recommendations: Continue current regimen and try taking with different foods/drinks until she can find one she can tolerate.  Drue Stager, PharmD [facility] 05/28/2012, 11:02 AM

## 2012-05-28 NOTE — Telephone Encounter (Signed)
I called patient to let her know that the Acyclovir ointment that she requested is not covered by her insurance, she stated that " I don't need that anyway, I don't have any lesions". Obviously she has used Valtrex and acyclovir in the past because it has been prescribed, so I just advised her to call when she needs a refill.

## 2012-06-11 ENCOUNTER — Ambulatory Visit (INDEPENDENT_AMBULATORY_CARE_PROVIDER_SITE_OTHER): Payer: Medicare Other | Admitting: Internal Medicine

## 2012-06-11 ENCOUNTER — Encounter: Payer: Self-pay | Admitting: Internal Medicine

## 2012-06-11 ENCOUNTER — Other Ambulatory Visit: Payer: Self-pay | Admitting: *Deleted

## 2012-06-11 VITALS — BP 120/75 | HR 96 | Temp 98.0°F | Ht 69.5 in | Wt 135.0 lb

## 2012-06-11 DIAGNOSIS — B029 Zoster without complications: Secondary | ICD-10-CM

## 2012-06-11 DIAGNOSIS — B20 Human immunodeficiency virus [HIV] disease: Secondary | ICD-10-CM

## 2012-06-11 LAB — COMPLETE METABOLIC PANEL WITH GFR
ALT: <8 U/L (ref 0–35)
AST: 16 U/L (ref 0–37)
BUN: 15 mg/dL (ref 6–23)
Calcium: 8.6 mg/dL (ref 8.4–10.5)
Chloride: 104 mEq/L (ref 96–112)
Creat: 1.47 mg/dL — ABNORMAL HIGH (ref 0.50–1.10)
GFR, Est African American: 52 mL/min — ABNORMAL LOW
Total Bilirubin: 0.2 mg/dL — ABNORMAL LOW (ref 0.3–1.2)

## 2012-06-11 LAB — CBC WITH DIFFERENTIAL/PLATELET
Hemoglobin: 8.4 g/dL — ABNORMAL LOW (ref 12.0–15.0)
Lymphocytes Relative: 32 % (ref 12–46)
Lymphs Abs: 2.7 10*3/uL (ref 0.7–4.0)
MCH: 24.6 pg — ABNORMAL LOW (ref 26.0–34.0)
Monocytes Relative: 13 % — ABNORMAL HIGH (ref 3–12)
Neutrophils Relative %: 42 % — ABNORMAL LOW (ref 43–77)
Platelets: 366 10*3/uL (ref 150–400)
RBC: 3.41 MIL/uL — ABNORMAL LOW (ref 3.87–5.11)
WBC: 8.2 10*3/uL (ref 4.0–10.5)

## 2012-06-11 MED ORDER — VALACYCLOVIR HCL 1 G PO TABS: 1000.0000 mg | ORAL_TABLET | Freq: Two times a day (BID) | ORAL | Status: DC

## 2012-06-12 LAB — HIV-1 RNA QUANT-NO REFLEX-BLD
HIV 1 RNA Quant: 61 copies/mL — ABNORMAL HIGH (ref <20)
HIV-1 RNA Quant, Log: 1.79 {Log} — ABNORMAL HIGH (ref <1.30)

## 2012-06-12 LAB — T-HELPER CELL (CD4) - (RCID CLINIC ONLY)
CD4 % Helper T Cell: 5 % — ABNORMAL LOW (ref 33–55)
CD4 T Cell Abs: 140 uL — ABNORMAL LOW (ref 400–2700)

## 2012-06-12 NOTE — Assessment & Plan Note (Signed)
I will check labs today to see if making any progress or not.  If not, she unlikely will do well and emphasis may need to be hospice.  Will await labs and she will follow up in about 3-4 weeks.

## 2012-06-12 NOTE — Progress Notes (Signed)
  Subjective:    Patient ID: Paula Becker, female    DOB: 26-Jun-1975, 37 y.o.   MRN: [id number]  HPI She comes in for follow up.  Last seen about 2 weeks ago and was supposed to be on Tivicay and Epzicom, crushed together, but was not taking. She told me she could not handle the two medicines together.  Started crushing each separately and reports taking them everyday.  She tells me she has not missed any doses now for the last 2 weeks.  She now is not having any problems taking it.  No new problems.     Review of Systems  Constitutional: Negative for fever and fatigue.  HENT: Positive for mouth sores and trouble swallowing. Negative for sore throat.   Gastrointestinal: Negative for nausea, abdominal pain and diarrhea.  Skin: Negative for rash.  Neurological: Negative for dizziness and headaches.       Objective:   Physical Exam  Constitutional:  Chronically ill appearing          Assessment & Plan:

## 2012-06-13 ENCOUNTER — Ambulatory Visit: Payer: Medicare Other

## 2012-06-13 ENCOUNTER — Other Ambulatory Visit: Payer: Self-pay | Admitting: *Deleted

## 2012-06-13 DIAGNOSIS — B20 Human immunodeficiency virus [HIV] disease: Secondary | ICD-10-CM

## 2012-06-13 MED ORDER — ABACAVIR SULFATE-LAMIVUDINE 600-300 MG PO TABS: 1.0000 | ORAL_TABLET | Freq: Every day | ORAL | Status: DC

## 2012-06-13 MED ORDER — DOLUTEGRAVIR SODIUM 50 MG PO TABS: 50.0000 mg | ORAL_TABLET | Freq: Every day | ORAL | Status: DC

## 2012-06-17 ENCOUNTER — Telehealth: Payer: Self-pay | Admitting: *Deleted

## 2012-06-17 NOTE — Telephone Encounter (Signed)
Pt reports blisters over right upper back, neck and shoulder started after changing to new medications last week.  Requesting appt.

## 2012-06-18 ENCOUNTER — Ambulatory Visit (INDEPENDENT_AMBULATORY_CARE_PROVIDER_SITE_OTHER): Payer: Medicare Other | Admitting: Internal Medicine

## 2012-06-18 ENCOUNTER — Encounter: Payer: Self-pay | Admitting: Internal Medicine

## 2012-06-18 VITALS — BP 127/81 | HR 93 | Temp 98.3°F | Ht 69.0 in | Wt 136.0 lb

## 2012-06-18 DIAGNOSIS — B20 Human immunodeficiency virus [HIV] disease: Secondary | ICD-10-CM

## 2012-06-18 DIAGNOSIS — R21 Rash and other nonspecific skin eruption: Secondary | ICD-10-CM

## 2012-06-18 MED ORDER — DIPHENHYDRAMINE HCL 12.5 MG/5ML PO LIQD: 12.5000 mg | Freq: Four times a day (QID) | ORAL | Status: DC | PRN

## 2012-06-18 NOTE — Progress Notes (Signed)
  Subjective:    Patient ID: Paula Becker, female    DOB: [birth year], 37 y.o.   MRN: [id number]  HPI She comes in for a work in visit for a rash. This rash has been going on for months though since restarting her antiretrovirals has become more itchy. She is scratching. It is on her back, face and torso. It is not new. The itch is more significant now also.   Review of Systems  Constitutional: Negative for fever and fatigue.  Musculoskeletal: Negative for joint swelling.  Skin: Positive for rash.  Hematological: Negative for adenopathy.  Psychiatric/Behavioral: Negative for dysphoric mood.       Objective:   Physical Exam  Constitutional:  Chronically ill appearing  Skin:  Diffuse macular, pruritic circular lesions about 3-4 mm each one.          Assessment & Plan:

## 2012-06-18 NOTE — Assessment & Plan Note (Signed)
I discussed with her her lab results are getting better. She has an appointment scheduled in was reminded of this appointment

## 2012-06-18 NOTE — Assessment & Plan Note (Addendum)
Rash on face and torso likely from systemic response to her immune system. Likely worsening do to improve in immune system. I will give her Benadryl and she is going to get over-the-counter Benadryl lotion as well.  She will return to her in 3 weeks for her usual follow up

## 2012-06-20 LAB — HLA B*5701: HLA-B*5701: NEGATIVE

## 2012-06-27 ENCOUNTER — Other Ambulatory Visit: Payer: Self-pay | Admitting: *Deleted

## 2012-06-27 DIAGNOSIS — B20 Human immunodeficiency virus [HIV] disease: Secondary | ICD-10-CM

## 2012-06-27 MED ORDER — SULFAMETHOXAZOLE-TRIMETHOPRIM 200-40 MG/5ML PO SUSP: 20.0000 mL | ORAL | Status: DC

## 2012-06-27 MED ORDER — ABACAVIR SULFATE-LAMIVUDINE 600-300 MG PO TABS: 1.0000 | ORAL_TABLET | Freq: Every day | ORAL | Status: DC

## 2012-06-27 MED ORDER — DOLUTEGRAVIR SODIUM 50 MG PO TABS: 50.0000 mg | ORAL_TABLET | Freq: Every day | ORAL | Status: DC

## 2012-07-02 ENCOUNTER — Ambulatory Visit: Payer: Medicare Other | Admitting: Internal Medicine

## 2012-08-27 ENCOUNTER — Encounter: Payer: Self-pay | Admitting: *Deleted

## 2012-09-03 ENCOUNTER — Ambulatory Visit: Payer: Medicare Other | Admitting: Internal Medicine

## 2012-10-08 ENCOUNTER — Ambulatory Visit (INDEPENDENT_AMBULATORY_CARE_PROVIDER_SITE_OTHER): Payer: Medicare Other | Admitting: Internal Medicine

## 2012-10-08 ENCOUNTER — Other Ambulatory Visit: Payer: Self-pay | Admitting: Internal Medicine

## 2012-10-08 ENCOUNTER — Encounter: Payer: Self-pay | Admitting: Internal Medicine

## 2012-10-08 VITALS — BP 107/72 | HR 78 | Temp 98.5°F | Ht 69.0 in | Wt 128.0 lb

## 2012-10-08 DIAGNOSIS — Z23 Encounter for immunization: Secondary | ICD-10-CM

## 2012-10-08 DIAGNOSIS — R21 Rash and other nonspecific skin eruption: Secondary | ICD-10-CM

## 2012-10-08 DIAGNOSIS — B20 Human immunodeficiency virus [HIV] disease: Secondary | ICD-10-CM

## 2012-10-08 LAB — CBC WITH DIFFERENTIAL/PLATELET
Basophils Relative: 0 % (ref 0–1)
Hemoglobin: 8.6 g/dL — ABNORMAL LOW (ref 12.0–15.0)
Lymphocytes Relative: 33 % (ref 12–46)
Lymphs Abs: 1.8 10*3/uL (ref 0.7–4.0)
MCHC: 32.7 g/dL (ref 30.0–36.0)
Monocytes Relative: 10 % (ref 3–12)
Neutro Abs: 2.6 10*3/uL (ref 1.7–7.7)
Neutrophils Relative %: 47 % (ref 43–77)
RBC: 3.47 MIL/uL — ABNORMAL LOW (ref 3.87–5.11)
WBC: 5.5 10*3/uL (ref 4.0–10.5)

## 2012-10-08 LAB — COMPLETE METABOLIC PANEL WITH GFR
Albumin: 3.2 g/dL — ABNORMAL LOW (ref 3.5–5.2)
Alkaline Phosphatase: 86 U/L (ref 39–117)
Chloride: 100 mEq/L (ref 96–112)
GFR, Est Non African American: 46 mL/min — ABNORMAL LOW
Glucose, Bld: 99 mg/dL (ref 70–99)
Potassium: 4.6 mEq/L (ref 3.5–5.3)
Sodium: 133 mEq/L — ABNORMAL LOW (ref 135–145)
Total Protein: 9.2 g/dL — ABNORMAL HIGH (ref 6.0–8.3)

## 2012-10-08 NOTE — Progress Notes (Signed)
  Subjective:    Patient ID: Paula Becker, female    DOB: 1976-01-22, 37 y.o.   MRN: [id number]  HPI He comes in for routine followup. She was last seen in May of this year and has a long history of noncompliance. We finally found a regimen that works for her she is able to crush and able to take well. She tells me she has been taking it daily with no missed doses. Her last viral load was less than 100. Her CD4 count was less than 200. She has had improvement of her facial rash, her weight is the same though she feels like she is eating better and feels better overall. She is smiling and pleased with her current regimen. She is not taking Bactrim.   Review of Systems  Constitutional: Negative for fever, activity change, appetite change and fatigue.  HENT: Negative for sore throat and trouble swallowing.   Eyes: Negative for visual disturbance.  Respiratory: Negative for cough and shortness of breath.   Cardiovascular: Negative for chest pain and leg swelling.  Gastrointestinal: Negative for nausea and diarrhea.  Musculoskeletal: Negative for myalgias and arthralgias.  Skin: Negative for rash.       Improved rash  Allergic/Immunologic: Positive for immunocompromised state.  Neurological: Negative for dizziness, light-headedness and headaches.  Hematological: Negative for adenopathy.       Objective:   Physical Exam  Constitutional:  Thin, chronically ill-appearing, though improved from previous  HENT:  Mouth/Throat: No oropharyngeal exudate.  Eyes: No scleral icterus.  Cardiovascular: Normal rate, regular rhythm and normal heart sounds.   No murmur heard. Pulmonary/Chest: Effort normal and breath sounds normal. No respiratory distress. She has no wheezes.  Lymphadenopathy:    She has no cervical adenopathy.  Neurological: She is alert.  Skin: No rash noted.  Some scarring from previous rash but no active rash          Assessment & Plan:

## 2012-10-08 NOTE — Assessment & Plan Note (Signed)
This seems to be getting better and I suspect this is due to improved immune reconstitution.

## 2012-10-08 NOTE — Assessment & Plan Note (Addendum)
We will check her labs today and hopefully is doing better. Ideally her CD4 count is increasing in her virus remains relatively suppressed. If there are any problems, she'll be called to be seen sooner otherwise she will return to clinic in 3 months. She is aware she needs to take Bactrim for PCP prophylaxis.

## 2012-10-08 NOTE — Progress Notes (Signed)
  [facility] - Pharmacist    HPI: Paula Becker is a 37 y.o. female here for follow-up.  Allergies: Allergies  Allergen Reactions  . Shellfish-Derived Products Swelling    Facial swelling  . Vancomycin Rash    Vitals: Temp: 98.5 F (36.9 C) (09/09 1013) Temp src: Oral (09/09 1013) BP: 107/72 mmHg (09/09 1013) Pulse Rate: 78 (09/09 1013)  Past Medical History: Past Medical History  Diagnosis Date  . HIV (human immunodeficiency virus infection)   . MRSA (methicillin resistant staph aureus) culture positive   . Necrotizing pneumonia 07/2010  . Pneumonia 06/23/11    RLL patchy, nodular lung disease  . Anemia of chronic disease   . History of noncompliance with medical treatment     Social History: History   Social History  . Marital Status: Single    Spouse Name: N/A    Number of Children: N/A  . Years of Education: N/A   Social History Main Topics  . Smoking status: Never Smoker   . Smokeless tobacco: Never Used  . Alcohol Use: No  . Drug Use: No  . Sexual Activity: No     Comment: declined condoms   Other Topics Concern  . None   Social History Narrative   Lives in [place] in her house.   Has support from her Dad for doctor visits.   Didn't work ever. Not working now.    Current Regimen: Epzicom, Dolutegravir  Labs: HIV 1 RNA Quant (copies/mL)  Date Value  06/11/2012 61*  02/06/2012 352*  01/02/2012 968*     CD4 T Cell Abs (cmm)  Date Value  06/11/2012 140*  02/06/2012 220*  01/02/2012 170*     Hep B S Ab (no units)  Date Value  04/15/2010 NEG      Hepatitis B Surface Ag (no units)  Date Value  04/15/2010 NEGATIVE      HCV Ab (no units)  Date Value  04/15/2010 NEGATIVE    CrCl: Estimated Creatinine Clearance: 47.6 ml/min (by C-G formula based on Cr of 1.47).  Lipids:    Component Value Date/Time   CHOL 120 02/17/2011 1254   TRIG 119 02/17/2011 1254   HDL 27* 02/17/2011 1254   CHOLHDL 4.4 02/17/2011 1254   VLDL  24 02/17/2011 1254   LDLCALC 69 02/17/2011 1254    Assessment: HIV - [name] is currently taking her Epzicom and Dolutegravir crushed due to swallowing difficulties.  She was talkative about her medications until I asked her to quantify how many doses she had missed in the past 2 weeks -- she did not answer my question or any questions that followed.  Recommendations: She could benefit from ongoing adherence counseling, and close follow-up given her history of mediation noncompliance.  Sallee Provencal, Pharm.D., BCPS, AAHIVP Clinical Infectious Disease Pharmacist [facility] 10/08/2012, 2:05 PM

## 2012-10-09 LAB — HIV-1 RNA ULTRAQUANT REFLEX TO GENTYP+
HIV 1 RNA Quant: 2058 copies/mL — ABNORMAL HIGH (ref <20)
HIV-1 RNA Quant, Log: 3.31 {Log} — ABNORMAL HIGH (ref <1.30)

## 2012-10-11 LAB — HIV-1 GENOTYPR PLUS

## 2012-10-14 ENCOUNTER — Other Ambulatory Visit: Payer: Self-pay | Admitting: Internal Medicine

## 2012-10-14 ENCOUNTER — Other Ambulatory Visit: Payer: Self-pay | Admitting: *Deleted

## 2012-10-14 ENCOUNTER — Telehealth: Payer: Self-pay | Admitting: *Deleted

## 2012-10-14 DIAGNOSIS — B20 Human immunodeficiency virus [HIV] disease: Secondary | ICD-10-CM

## 2012-10-14 MED ORDER — DARUNAVIR ETHANOLATE 400 MG PO TABS: 800.0000 mg | ORAL_TABLET | Freq: Every day | ORAL | Status: DC

## 2012-10-14 MED ORDER — RITONAVIR 100 MG PO TABS: 100.0000 mg | ORAL_TABLET | Freq: Every day | ORAL | Status: DC

## 2012-10-14 MED ORDER — RITONAVIR 80 MG/ML PO SOLN: 100.0000 mg | Freq: Every day | ORAL | Status: DC

## 2012-10-14 MED ORDER — DARUNAVIR ETHANOLATE 100 MG/ML PO SUSP: 800.0000 mg | Freq: Every day | ORAL | Status: DC

## 2012-10-14 NOTE — Telephone Encounter (Signed)
Notified pt of lab results, change in regimen. She states she would prefer to crush pills over drawing up liquid medication.  Pt unsure if she can get a ride on Tuesday or Thursday for an appointment; will call back to day after she checks with her father.

## 2012-10-14 NOTE — Telephone Encounter (Signed)
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  For her to get her virus under control, she will need to ADD norvir and prezista to her regimen daily.  I have prescribed this in solution if she is willing (previously she was not willing).  It could also be pills if she prefers to crush.  Have her come in when the pharmacist is in clinic next (ok to overbook with me) to discuss, this week if possible.        Thanks ------

## 2012-10-15 ENCOUNTER — Ambulatory Visit: Payer: Medicare Other

## 2012-10-15 ENCOUNTER — Ambulatory Visit (INDEPENDENT_AMBULATORY_CARE_PROVIDER_SITE_OTHER): Payer: Medicare Other | Admitting: Internal Medicine

## 2012-10-15 VITALS — BP 111/74 | HR 103 | Temp 98.5°F | Ht 69.0 in | Wt 128.0 lb

## 2012-10-15 DIAGNOSIS — B20 Human immunodeficiency virus [HIV] disease: Secondary | ICD-10-CM

## 2012-10-15 MED ORDER — RITONAVIR 80 MG/ML PO SOLN: 100.0000 mg | Freq: Every day | ORAL | Status: DC

## 2012-10-15 MED ORDER — DARUNAVIR ETHANOLATE 100 MG/ML PO SUSP: 800.0000 mg | Freq: Every day | ORAL | Status: DC

## 2012-10-15 NOTE — Progress Notes (Signed)
  Subjective:    Patient ID: Paula Becker, female    DOB: 22-Nov-1975, 37 y.o.   MRN: [id number]  HPI She comes in for followup. I saw her last week on her regimen of crushed and Epzicom and tivicay.  She told me she had excellent compliance, however with the labs that were done at the appointment, there were significant resistance mutations. She is in here today for the need to change her regimen. She has had a long history of poor compliance mainly due to not tolerating her medications and swallowing pills or liquids. I really unclear though overall what her biggest issue is with her but have continued to counsel her on compliance. She does not endorse any new problems.   Review of Systems  Constitutional: Negative for fever, activity change, appetite change, fatigue and unexpected weight change.  HENT: Negative for sore throat and trouble swallowing.   Eyes: Negative for visual disturbance.  Respiratory: Negative for shortness of breath.   Cardiovascular: Negative for chest pain.  Gastrointestinal: Negative for nausea and diarrhea.  Skin: Positive for rash.       Facial rash is stable  Neurological: Negative for dizziness and headaches.  Hematological: Negative for adenopathy.  Psychiatric/Behavioral: Negative for dysphoric mood.       Objective:   Physical Exam  Constitutional:  Thin and chronically ill-appearing  HENT:  Mouth/Throat: No oropharyngeal exudate.  Eyes: No scleral icterus.  Cardiovascular: Normal rate, regular rhythm and normal heart sounds.   No murmur heard. Pulmonary/Chest: Effort normal and breath sounds normal. No respiratory distress.  Lymphadenopathy:    She has no cervical adenopathy.  Neurological: She is alert.  Skin:   facial rash  Psychiatric: She has a normal mood and affect.          Assessment & Plan:

## 2012-10-15 NOTE — Progress Notes (Signed)
[facility] - Pharmacist    HPI: Paula Becker is a 37 y.o. female here to discuss a potential regimen change.  Allergies: Allergies  Allergen Reactions  . Shellfish-Derived Products Swelling    Facial swelling  . Vancomycin Rash    Vitals: Temp: 98.5 F (36.9 C) (09/16 1046) Temp src: Oral (09/16 1046) BP: 111/74 mmHg (09/16 1046) Pulse Rate: 103 (09/16 1046)  Past Medical History: Past Medical History  Diagnosis Date  . HIV (human immunodeficiency virus infection)   . MRSA (methicillin resistant staph aureus) culture positive   . Necrotizing pneumonia 07/2010  . Pneumonia 06/23/11    RLL patchy, nodular lung disease  . Anemia of chronic disease   . History of noncompliance with medical treatment     Social History: History   Social History  . Marital Status: Single    Spouse Name: N/A    Number of Children: N/A  . Years of Education: N/A   Social History Main Topics  . Smoking status: Never Smoker   . Smokeless tobacco: Never Used  . Alcohol Use: No  . Drug Use: No  . Sexual Activity: No     Comment: declined condoms   Other Topics Concern  . Not on file   Social History Narrative   Lives in [place] in her house.   Has support from her Dad for doctor visits.   Didn't work ever. Not working now.    Previous Regimen: Dolutegravir, Epzicom - she crushes her tablets  Labs: HIV 1 RNA Quant (copies/mL)  Date Value  10/08/2012 2058*  06/11/2012 61*  02/06/2012 352*     CD4 T Cell Abs (/uL)  Date Value  10/08/2012 110*  06/11/2012 140*  02/06/2012 220*     Hep B S Ab (no units)  Date Value  04/15/2010 NEG      Hepatitis B Surface Ag (no units)  Date Value  04/15/2010 NEGATIVE      HCV Ab (no units)  Date Value  04/15/2010 NEGATIVE    HIV Genotype Composite Data Genotype Dates: 04/15/10, 09/12/10, 10/08/12 Mutations in Victor impact drug susceptibility RT Mutations M184V, K101E, V106I, V179F, Y181C, G190A, H221Y  PI Mutations  M36I   Interpretation of Genotype Data per [employer] HIV Database Nucleoside RTIs  lamivudine (3TC) High-level resistance abacavir (ABC) Low-level resistance zidovudine (AZT) Susceptible stavudine (D4T) Susceptible didanosine (DDI) Potential low-level resistance emtricitabine (FTC) High-level resistance tenofovir (TDF) Susceptible   Non-Nucleoside RTIs  efavirenz (EFV) High-level resistance etravirine (ETR) High-level resistance nevirapine (NVP) High-level resistance rilpivirine (RPV) High-level resistance   Protease Inhibitors  atazanavir/r (ATV/r) Susceptible darunavir/r (DRV/r) Susceptible fosamprenavir/r (FPV/r) Susceptible indinavir/r (IDV/r) Susceptible lopinavir/r (LPV/r) Susceptible nelfinavir (NFV) Susceptible saquinavir/r (SQV/r) Susceptible tipranavir/r (TPV/r) Susceptible   CrCl: Estimated Creatinine Clearance: 48.6 ml/min (by C-G formula based on Cr of 1.44).  Lipids:    Component Value Date/Time   CHOL 120 02/17/2011 1254   TRIG 119 02/17/2011 1254   HDL 27* 02/17/2011 1254   CHOLHDL 4.4 02/17/2011 1254   VLDL 24 02/17/2011 1254   LDLCALC 69 02/17/2011 1254    Assessment: Her HIV remains uncontrolled, and she has developed further resistance.  She would benefit from addition of a protease inhibitor.  Any medication changes are complicated by her unwillingness to take tablets unless she can crush them and they must taste OK to her when crushed.  We discussed Prezista and Norvir.  She has taken these medications before in liquid form, and would prefer to take  the liquids again rather than trying a tablet that she does not know how it will taste.  We also discussed how to take the liquid medication.  I have some concerns if she will be able to properly measure her medication dosage.  Recommendations: Start Prezista and Norvir liquids.   I have spoken with Boris Sharper, HIV pharmacist at [facility] who will send out adequate measuring supplies for her liquid  medications. Follow-up at her next visit for adherence and viral load response to regimen intensification.  Sallee Provencal, Pharm.D., BCPS, AAHIVP Clinical Infectious Disease Pharmacist [facility] 10/15/2012, 12:16 PM

## 2012-10-15 NOTE — Assessment & Plan Note (Signed)
After discussion, and discussion by the pharmacist, we will add liquid Norvir and liquid Prezista in addition to her tivicay and Epzicom. I again discussed compliance and reiterated that this is her last opportunity to get her virus under control. I also gave her the option of not taking medications and consider hospice. She however told me she is determined to take her medications. She will return in 3 months and we will recheck her virus at that time.

## 2012-10-24 ENCOUNTER — Other Ambulatory Visit: Payer: Self-pay | Admitting: *Deleted

## 2012-10-24 DIAGNOSIS — B20 Human immunodeficiency virus [HIV] disease: Secondary | ICD-10-CM

## 2012-10-24 MED ORDER — SULFAMETHOXAZOLE-TRIMETHOPRIM 200-40 MG/5ML PO SUSP: 20.0000 mL | ORAL | Status: DC

## 2012-11-07 ENCOUNTER — Telehealth: Payer: Self-pay | Admitting: *Deleted

## 2012-11-07 ENCOUNTER — Other Ambulatory Visit: Payer: Self-pay | Admitting: Internal Medicine

## 2012-11-07 ENCOUNTER — Ambulatory Visit (INDEPENDENT_AMBULATORY_CARE_PROVIDER_SITE_OTHER): Payer: Medicare Other | Admitting: Internal Medicine

## 2012-11-07 VITALS — BP 112/73 | HR 84 | Temp 98.1°F | Ht 69.5 in | Wt 128.0 lb

## 2012-11-07 DIAGNOSIS — B20 Human immunodeficiency virus [HIV] disease: Secondary | ICD-10-CM

## 2012-11-07 DIAGNOSIS — R21 Rash and other nonspecific skin eruption: Secondary | ICD-10-CM

## 2012-11-07 NOTE — Assessment & Plan Note (Signed)
Her rash persists and is on her back and legs. She will try some topical ointment.

## 2012-11-07 NOTE — Progress Notes (Signed)
  Subjective:    Patient ID: Paula Becker, female    DOB: 1975-10-02, 37 y.o.   MRN: [id number]  HPI  She comes in for followup. I saw her earlier this month on her regimen of crushed and Epzicom and tivicay.  She told me she had excellent compliance, however with the labs that were done at the appointment, there were significant resistance mutations. She then had prezista and norvir liquid to her regimen. She has had a long history of poor compliance mainly due to not tolerating her medications and swallowing pills or liquids. I really unclear though overall what her biggest issue is with her but have continued to counsel her on compliance. She does not endorse any new problems.   Review of Systems  Constitutional: Negative for fever, activity change, appetite change, fatigue and unexpected weight change.  HENT: Negative for sore throat and trouble swallowing.   Eyes: Negative for visual disturbance.  Respiratory: Negative for shortness of breath.   Cardiovascular: Negative for chest pain.  Gastrointestinal: Negative for nausea and diarrhea.  Skin: Positive for rash.       Facial rash is stable  Neurological: Negative for dizziness and headaches.  Hematological: Negative for adenopathy.  Psychiatric/Behavioral: Negative for dysphoric mood.       Objective:   Physical Exam  Constitutional:  Thin and chronically ill-appearing  HENT:  Mouth/Throat: No oropharyngeal exudate.  Eyes: No scleral icterus.  Cardiovascular: Normal rate, regular rhythm and normal heart sounds.   No murmur heard. Pulmonary/Chest: Effort normal and breath sounds normal. No respiratory distress.  Lymphadenopathy:    She has no cervical adenopathy.  Neurological: She is alert.  Skin:   facial rash  Psychiatric: She has a normal mood and affect.          Assessment & Plan:

## 2012-11-07 NOTE — Assessment & Plan Note (Signed)
She endorses compliance. She though does take it sporadic times a day. We'll check her labs today. She is aware that this is essentially her last option. She will return in one month

## 2012-11-07 NOTE — Telephone Encounter (Signed)
Left message inquiring about a referral for dental services. Andree Coss, RN

## 2012-11-08 LAB — T-HELPER CELL (CD4) - (RCID CLINIC ONLY): CD4 % Helper T Cell: 10 % — ABNORMAL LOW (ref 33–55)

## 2012-11-15 ENCOUNTER — Ambulatory Visit: Payer: Medicare Other

## 2012-11-20 LAB — HIV-1 GENOTYPR PLUS

## 2012-12-05 ENCOUNTER — Telehealth: Payer: Self-pay | Admitting: *Deleted

## 2012-12-05 ENCOUNTER — Ambulatory Visit: Payer: Medicare Other | Admitting: Internal Medicine

## 2012-12-05 NOTE — Telephone Encounter (Signed)
Offered to schedule the pt for another OV.  She stated she would call back.

## 2012-12-19 ENCOUNTER — Telehealth: Payer: Self-pay | Admitting: *Deleted

## 2012-12-19 DIAGNOSIS — G8929 Other chronic pain: Secondary | ICD-10-CM | POA: Insufficient documentation

## 2012-12-19 NOTE — Telephone Encounter (Signed)
There is an previous referral in Epic for PT.  Will update the request and have [facility] contact the pt.

## 2012-12-30 ENCOUNTER — Ambulatory Visit: Payer: Medicare Other

## 2013-01-01 DIAGNOSIS — L2089 Other atopic dermatitis: Secondary | ICD-10-CM | POA: Diagnosis not present

## 2013-01-02 ENCOUNTER — Ambulatory Visit: Payer: Medicare Other

## 2013-01-06 ENCOUNTER — Other Ambulatory Visit: Payer: Self-pay | Admitting: *Deleted

## 2013-01-06 ENCOUNTER — Other Ambulatory Visit: Payer: Medicare Other

## 2013-01-06 DIAGNOSIS — B20 Human immunodeficiency virus [HIV] disease: Secondary | ICD-10-CM

## 2013-01-06 MED ORDER — ABACAVIR SULFATE-LAMIVUDINE 600-300 MG PO TABS: 1.0000 | ORAL_TABLET | Freq: Every day | ORAL | Status: DC

## 2013-01-06 MED ORDER — DOLUTEGRAVIR SODIUM 50 MG PO TABS: 50.0000 mg | ORAL_TABLET | Freq: Every day | ORAL | Status: DC

## 2013-01-07 ENCOUNTER — Other Ambulatory Visit: Payer: Medicare Other

## 2013-01-07 DIAGNOSIS — B20 Human immunodeficiency virus [HIV] disease: Secondary | ICD-10-CM

## 2013-01-13 ENCOUNTER — Ambulatory Visit: Payer: Medicare Other

## 2013-01-21 ENCOUNTER — Ambulatory Visit: Payer: Medicare Other

## 2013-01-21 ENCOUNTER — Ambulatory Visit (INDEPENDENT_AMBULATORY_CARE_PROVIDER_SITE_OTHER): Payer: Medicare Other | Admitting: Internal Medicine

## 2013-01-21 ENCOUNTER — Other Ambulatory Visit: Payer: Self-pay | Admitting: Licensed Clinical Social Worker

## 2013-01-21 ENCOUNTER — Encounter: Payer: Self-pay | Admitting: Internal Medicine

## 2013-01-21 VITALS — BP 115/82 | HR 84 | Temp 98.2°F | Wt 127.0 lb

## 2013-01-21 DIAGNOSIS — B20 Human immunodeficiency virus [HIV] disease: Secondary | ICD-10-CM

## 2013-01-21 DIAGNOSIS — R21 Rash and other nonspecific skin eruption: Secondary | ICD-10-CM | POA: Diagnosis not present

## 2013-01-21 MED ORDER — RITONAVIR 80 MG/ML PO SOLN: 100.0000 mg | Freq: Every day | ORAL | Status: DC

## 2013-01-21 MED ORDER — ABACAVIR SULFATE-LAMIVUDINE 600-300 MG PO TABS: 1.0000 | ORAL_TABLET | Freq: Every day | ORAL | Status: DC

## 2013-01-21 MED ORDER — DOLUTEGRAVIR SODIUM 50 MG PO TABS: 50.0000 mg | ORAL_TABLET | Freq: Every day | ORAL | Status: DC

## 2013-01-21 MED ORDER — DARUNAVIR ETHANOLATE 100 MG/ML PO SUSP: 800.0000 mg | Freq: Every day | ORAL | Status: DC

## 2013-01-21 NOTE — Progress Notes (Signed)
  Subjective:    Patient ID: Paula Becker, female    DOB: 1975/11/26, 37 y.o.   MRN: [id number]  HPI  She comes in for followup.  She is on a regimen of crushed Epzicom and tivicay with prezista and norvir liquid. She has had a long history of poor compliance mainly due to not tolerating her medications and swallowing pills or liquids. I really unclear though overall what her biggest issue is with her but have continued to counsel her on compliance. She was to get labs 2 weeks ago but had not been doing well taking her medications so labs were postponed.  She reports taking them daily since.  She complains of the same rash and saw a dermatologist who recommended two lotions.  It has helped.     Review of Systems  Constitutional: Negative for fever, activity change, appetite change, fatigue and unexpected weight change.  HENT: Negative for sore throat and trouble swallowing.   Eyes: Negative for visual disturbance.  Respiratory: Negative for shortness of breath.   Cardiovascular: Negative for chest pain.  Gastrointestinal: Negative for nausea and diarrhea.  Skin: Positive for rash.       Facial rash is stable  Neurological: Negative for dizziness and headaches.  Hematological: Negative for adenopathy.  Psychiatric/Behavioral: Negative for dysphoric mood.       Objective:   Physical Exam  Constitutional:  Thin and chronically ill-appearing  HENT:  Mouth/Throat: No oropharyngeal exudate.  Eyes: No scleral icterus.  Cardiovascular: Normal rate, regular rhythm and normal heart sounds.   No murmur heard. Pulmonary/Chest: Effort normal and breath sounds normal. No respiratory distress.  Lymphadenopathy:    She has no cervical adenopathy.  Neurological: She is alert.  Skin:   facial rash  Psychiatric: She has a normal mood and affect.          Assessment & Plan:

## 2013-01-21 NOTE — Assessment & Plan Note (Addendum)
She has not been doing well with compliance. Will check labs in 2 months and see if there is any benefit.  If still detectable, will consider referral to palliative care.

## 2013-02-11 ENCOUNTER — Telehealth: Payer: Self-pay | Admitting: *Deleted

## 2013-02-11 NOTE — Telephone Encounter (Signed)
Bridge Counselor referral completed and given to [facility].

## 2013-02-11 NOTE — Telephone Encounter (Signed)
Referral to [facility] - may take some time, heavy caseload.  Prepared paperwork packet and delivered to [facility].

## 2013-02-11 NOTE — Telephone Encounter (Signed)
Pt states that she is continuing to have problems taking her rxes correctly.  Feels she needs ongoing assistance to help her with her medications.  MD please advise about "palliative care" mentioned in last OV note.

## 2013-02-11 NOTE — Telephone Encounter (Signed)
She previously had assistance at home but still was not able to take the medication and did not let the caregiver in after multiple attempts.  If there is someone available to go out once a week or so, maybe [facility]?  Not sure what help that will be for her but ok with me.

## 2013-02-12 ENCOUNTER — Other Ambulatory Visit: Payer: Self-pay | Admitting: *Deleted

## 2013-02-12 DIAGNOSIS — B20 Human immunodeficiency virus [HIV] disease: Secondary | ICD-10-CM

## 2013-02-12 MED ORDER — ABACAVIR SULFATE-LAMIVUDINE 600-300 MG PO TABS: 1.0000 | ORAL_TABLET | Freq: Every day | ORAL | Status: DC

## 2013-02-12 MED ORDER — DOLUTEGRAVIR SODIUM 50 MG PO TABS: 50.0000 mg | ORAL_TABLET | Freq: Every day | ORAL | Status: DC

## 2013-02-12 MED ORDER — DARUNAVIR ETHANOLATE 100 MG/ML PO SUSP: 800.0000 mg | Freq: Every day | ORAL | Status: DC

## 2013-02-12 MED ORDER — RITONAVIR 80 MG/ML PO SOLN: 100.0000 mg | Freq: Every day | ORAL | Status: DC

## 2013-03-27 ENCOUNTER — Other Ambulatory Visit: Payer: Medicare Other

## 2013-04-07 ENCOUNTER — Telehealth: Payer: Self-pay | Admitting: *Deleted

## 2013-04-07 NOTE — Telephone Encounter (Signed)
Liquid Prezista is back ordered until May 2015.  Paula Becker at [facility] wanted pt's MD to know situation of back order.

## 2013-04-07 NOTE — Telephone Encounter (Signed)
Can you ask her if she can take the darunavir pills, or needs to go on liquid kaletra? If she goes on the lilquid [place] will need to stop the norvir.  thanks

## 2013-04-09 NOTE — Telephone Encounter (Signed)
RN spoke to [facility] on [place] again.  They now have a supply of the liquid Prezista.  No longer a problem.

## 2013-04-15 ENCOUNTER — Telehealth: Payer: Self-pay | Admitting: *Deleted

## 2013-04-15 NOTE — Telephone Encounter (Signed)
Call from [place], pharmacist with [facility]. Norvir solution is on back order. Do you want to order Norvir tablets for patient to crush? Paula Becker

## 2013-04-15 NOTE — Telephone Encounter (Signed)
Ok with me if she will do it.  thanks

## 2013-04-15 NOTE — Telephone Encounter (Signed)
Pharmacist notified Paula Becker

## 2013-04-17 ENCOUNTER — Ambulatory Visit: Payer: Medicare Other

## 2013-04-17 ENCOUNTER — Ambulatory Visit (INDEPENDENT_AMBULATORY_CARE_PROVIDER_SITE_OTHER): Payer: Medicare Other | Admitting: Internal Medicine

## 2013-04-17 VITALS — BP 108/73 | HR 90 | Temp 98.3°F | Ht 67.0 in | Wt 127.0 lb

## 2013-04-17 DIAGNOSIS — B009 Herpesviral infection, unspecified: Secondary | ICD-10-CM

## 2013-04-17 DIAGNOSIS — A609 Anogenital herpesviral infection, unspecified: Secondary | ICD-10-CM

## 2013-04-17 DIAGNOSIS — B20 Human immunodeficiency virus [HIV] disease: Secondary | ICD-10-CM

## 2013-04-17 MED ORDER — ACYCLOVIR 200 MG/5ML PO SUSP: 800.0000 mg | Freq: Every day | ORAL | Status: DC

## 2013-04-17 NOTE — Progress Notes (Signed)
  Subjective:    Patient ID: Paula Becker, female    DOB: 1975/09/15, 38 y.o.   MRN: [id number]  HPI  She comes in for followup.  She is on a regimen of crushed Epzicom and tivicay with prezista and norvir liquid but not actually taking it. She has had a long history of poor compliance mainly due to not tolerating her medications and swallowing pills or liquids. I really unclear though overall what her biggest issue is with her but have continued to counsel her on compliance.   She complains of zoster on lower back and cold sore on mouth.  This has been a chronic problem.    Review of Systems  Constitutional: Negative for fever, activity change, appetite change, fatigue and unexpected weight change.  HENT: Negative for sore throat and trouble swallowing.   Eyes: Negative for visual disturbance.  Respiratory: Negative for shortness of breath.   Cardiovascular: Negative for chest pain.  Gastrointestinal: Negative for nausea and diarrhea.  Skin: Positive for rash.       Facial rash is stable  Neurological: Negative for dizziness and headaches.  Hematological: Negative for adenopathy.  Psychiatric/Behavioral: Negative for dysphoric mood.       Objective:   Physical Exam  Constitutional:  Thin and chronically ill-appearing  HENT:  Mouth/Throat: No oropharyngeal exudate.  Eyes: No scleral icterus.  Cardiovascular: Normal rate, regular rhythm and normal heart sounds.   No murmur heard. Pulmonary/Chest: Effort normal and breath sounds normal. No respiratory distress.  Lymphadenopathy:    She has no cervical adenopathy.  Neurological: She is alert.  Skin:   facial rash  Psychiatric: She has a normal mood and affect.          Assessment & Plan:

## 2013-04-17 NOTE — Assessment & Plan Note (Signed)
Looks like zoster, will try acyclovir solution

## 2013-04-17 NOTE — Assessment & Plan Note (Signed)
Not taking, emphasized compliance, to start and labs in 1 month.

## 2013-05-07 ENCOUNTER — Encounter: Payer: Self-pay | Admitting: *Deleted

## 2013-05-10 ENCOUNTER — Other Ambulatory Visit: Payer: Self-pay | Admitting: Internal Medicine

## 2013-05-19 ENCOUNTER — Ambulatory Visit: Payer: Medicare Other

## 2013-05-19 ENCOUNTER — Other Ambulatory Visit: Payer: Medicare Other

## 2013-05-19 DIAGNOSIS — B20 Human immunodeficiency virus [HIV] disease: Secondary | ICD-10-CM

## 2013-05-20 LAB — T-HELPER CELL (CD4) - (RCID CLINIC ONLY)
CD4 % Helper T Cell: 4 % — ABNORMAL LOW (ref 33–55)
CD4 T CELL ABS: 60 /uL — AB (ref 400–2700)

## 2013-05-20 LAB — HIV-1 RNA QUANT-NO REFLEX-BLD
HIV 1 RNA Quant: 3134 copies/mL — ABNORMAL HIGH (ref <20)
HIV-1 RNA Quant, Log: 3.5 {Log} — ABNORMAL HIGH (ref <1.30)

## 2013-05-26 ENCOUNTER — Other Ambulatory Visit: Payer: Self-pay | Admitting: *Deleted

## 2013-05-26 ENCOUNTER — Telehealth: Payer: Self-pay | Admitting: *Deleted

## 2013-05-26 DIAGNOSIS — A6000 Herpesviral infection of urogenital system, unspecified: Secondary | ICD-10-CM

## 2013-05-26 MED ORDER — ACYCLOVIR 5 % EX OINT: 1.0000 "application " | TOPICAL_OINTMENT | CUTANEOUS | Status: DC

## 2013-05-26 NOTE — Telephone Encounter (Signed)
Sent order to pharmacy. Landis Gandy, RN

## 2013-05-26 NOTE — Telephone Encounter (Signed)
Pt's Part D Medicare requiring [place] for acyclovir ointment.  Received faxed approval notice.  Phone in rx to [facility].  Pt needs rx delivered to her home.

## 2013-05-26 NOTE — Telephone Encounter (Signed)
Previous rx for acyclovir cream 5%, not current now.  Pt requesting "cream" be rxed for her "breakout."  Please advise.

## 2013-05-26 NOTE — Telephone Encounter (Signed)
Previous rx in chart for Zovo

## 2013-05-26 NOTE — Addendum Note (Signed)
Addended by: Lorne Skeens D on: 05/26/2013 11:58 AM   Modules accepted: Orders

## 2013-05-26 NOTE — Telephone Encounter (Signed)
Pt c/o anal irritation causing pain.  Requesting appt.  Unable to make appt for this week with Dr. Linus Salmons.  Pt already has scheduled appt for next Tuesday, 06/03/13 w/ Dr. Linus Salmons.  RN advised pt to use vaseline on the area to provide comfort and lubrication to the area as well.  Pt verbalized understanding.  Will keep the appt for next week.

## 2013-05-26 NOTE — Telephone Encounter (Signed)
She can have the acyclovir cream, but she was unable to afford it before, so not likely now either.  Not sure what else to give her.

## 2013-06-03 ENCOUNTER — Ambulatory Visit (INDEPENDENT_AMBULATORY_CARE_PROVIDER_SITE_OTHER): Payer: Medicare Other | Admitting: Internal Medicine

## 2013-06-03 ENCOUNTER — Encounter: Payer: Self-pay | Admitting: Internal Medicine

## 2013-06-03 VITALS — BP 111/74 | HR 99 | Temp 98.3°F | Wt 125.0 lb

## 2013-06-03 DIAGNOSIS — B20 Human immunodeficiency virus [HIV] disease: Secondary | ICD-10-CM

## 2013-06-03 MED ORDER — RITONAVIR 80 MG/ML PO SOLN: 100.0000 mg | Freq: Every day | ORAL | Status: DC

## 2013-06-03 MED ORDER — DARUNAVIR ETHANOLATE 100 MG/ML PO SUSP: 800.0000 mg | Freq: Every day | ORAL | Status: DC

## 2013-06-03 MED ORDER — ABACAVIR-DOLUTEGRAVIR-LAMIVUD 600-50-300 MG PO TABS: 1.0000 | ORAL_TABLET | Freq: Every day | ORAL | Status: DC

## 2013-06-03 NOTE — Progress Notes (Signed)
  Subjective:    Patient ID: Paula Becker, female    DOB: 1975/07/07, 38 y.o.   MRN: [id number]  HPI  She comes in for followup.  She is on a regimen of crushed Epzicom and tivicay with prezista and norvir liquid and reports taking it.  Unfortunately though, there is currently no norvir liquid so has not been on that.  She has had a long history of poor compliance mainly due to not tolerating her medications and swallowing pills or liquids. I really unclear though overall what her biggest issue is with her but have continued to counsel her on compliance.   She complains of zoster on lower back and cold sore on mouth.  This has been a chronic problem.    Review of Systems  Constitutional: Negative for fever, activity change, appetite change, fatigue and unexpected weight change.  HENT: Negative for sore throat and trouble swallowing.   Eyes: Negative for visual disturbance.  Respiratory: Negative for shortness of breath.   Cardiovascular: Negative for chest pain.  Gastrointestinal: Negative for nausea and diarrhea.  Skin: Positive for rash.       Facial rash is stable  Neurological: Negative for dizziness and headaches.  Hematological: Negative for adenopathy.  Psychiatric/Behavioral: Negative for dysphoric mood.       Objective:   Physical Exam  Constitutional:  Thin and chronically ill-appearing  HENT:  Mouth/Throat: No oropharyngeal exudate.  Eyes: No scleral icterus.  Cardiovascular: Normal rate, regular rhythm and normal heart sounds.   No murmur heard. Pulmonary/Chest: Effort normal and breath sounds normal. No respiratory distress.  Lymphadenopathy:    She has no cervical adenopathy.  Neurological: She is alert.  Skin:   facial rash  Psychiatric: She has a normal mood and affect.          Assessment & Plan:

## 2013-06-03 NOTE — Assessment & Plan Note (Signed)
I am going to simplify even more to Triumeq and Prezista/r.  Will alert her CM.  I will also check genotype today since there is little activity with current regimen (only 1 log difference from baseline). RTC 3 months, sooner if indicated.

## 2013-06-04 LAB — HIV-1 RNA ULTRAQUANT REFLEX TO GENTYP+
HIV 1 RNA Quant: 14497 copies/mL — ABNORMAL HIGH (ref <20)
HIV-1 RNA Quant, Log: 4.16 {Log} — ABNORMAL HIGH (ref <1.30)

## 2013-06-17 LAB — HIV-1 GENOTYPR PLUS

## 2013-06-19 ENCOUNTER — Other Ambulatory Visit: Payer: Self-pay | Admitting: Internal Medicine

## 2013-06-19 DIAGNOSIS — B20 Human immunodeficiency virus [HIV] disease: Secondary | ICD-10-CM

## 2013-06-19 MED ORDER — DARUNAVIR ETHANOLATE 100 MG/ML PO SUSP
800.0000 mg | Freq: Every day | ORAL | Status: DC
Start: 2013-06-19 — End: 2013-06-30

## 2013-06-19 MED ORDER — RITONAVIR 80 MG/ML PO SOLN: 100.0000 mg | Freq: Every day | ORAL | Status: DC

## 2013-06-19 MED ORDER — DOLUTEGRAVIR SODIUM 50 MG PO TABS: 50.0000 mg | ORAL_TABLET | Freq: Every day | ORAL | Status: DC

## 2013-06-19 MED ORDER — ZIDOVUDINE 10 MG/ML PO SYRP: 300.0000 mg | ORAL_SOLUTION | Freq: Two times a day (BID) | ORAL | Status: DC

## 2013-06-20 ENCOUNTER — Telehealth: Payer: Self-pay | Admitting: Licensed Clinical Social Worker

## 2013-06-20 NOTE — Telephone Encounter (Signed)
error 

## 2013-06-24 ENCOUNTER — Telehealth: Payer: Self-pay | Admitting: *Deleted

## 2013-06-24 NOTE — Telephone Encounter (Signed)
Oral pain x 4 days.   Gums, lips and under tongue painful.  Not her teeth.  Requesting appt.

## 2013-06-25 ENCOUNTER — Ambulatory Visit (INDEPENDENT_AMBULATORY_CARE_PROVIDER_SITE_OTHER): Payer: Medicare Other | Admitting: Internal Medicine

## 2013-06-25 ENCOUNTER — Encounter: Payer: Self-pay | Admitting: Internal Medicine

## 2013-06-25 VITALS — BP 123/81 | HR 94 | Temp 98.2°F | Ht 69.5 in | Wt 124.0 lb

## 2013-06-25 DIAGNOSIS — L0293 Carbuncle, unspecified: Secondary | ICD-10-CM

## 2013-06-25 DIAGNOSIS — L0292 Furuncle, unspecified: Secondary | ICD-10-CM

## 2013-06-25 MED ORDER — SULFAMETHOXAZOLE-TRIMETHOPRIM 200-40 MG/5ML PO SUSP: 20.0000 mL | Freq: Two times a day (BID) | ORAL | Status: DC

## 2013-06-25 NOTE — Progress Notes (Signed)
Subjective:    Patient ID: Paula Becker, female    DOB: 1975/08/05, 38 y.o.   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  geno showed F81W,E993Z,J696V, G190A,Y181C,M184V,K219Q. She comes into clinic as sick visit to assess skin lesions/boils to buttock and right side of chin. Tender, warm, +drainage from one lesion of buttock. No systemic symptoms of fevers, chills or nightsweats  Current Outpatient Prescriptions on File Prior to Visit  Medication Sig Dispense Refill  . acyclovir ointment (ZOVIRAX) 5 % Apply 1 application topically every 3 (three) hours.  30 g  2  . Darunavir Ethanolate 100 MG/ML SUSP Take 800 mg by mouth daily.  200 mL  5  . dolutegravir (TIVICAY) 50 MG tablet Take 1 tablet (50 mg total) by mouth daily.  30 tablet  5  . ritonavir (NORVIR) 80 MG/ML solution Take 1.3 mLs (104 mg total) by mouth daily.  240 mL  5  . sulfamethoxazole-trimethoprim (BACTRIM,SEPTRA) 200-40 MG/5ML suspension Take 20 mLs by mouth 3 (three) times a week. Tue Thur Sat  300 mL  5  . zidovudine (RETROVIR) 10 MG/ML syrup Take 30 mLs (300 mg total) by mouth 2 (two) times daily.  240 mL  12  . acyclovir (ZOVIRAX) 200 MG/5ML suspension TAKE 20 MLS BY MOUTH FIVE TIMES DAILY  700 mL  0  . albuterol (PROVENTIL HFA;VENTOLIN HFA) 108 (90 BASE) MCG/ACT inhaler Inhale 2 puffs into the lungs every 6 (six) hours as needed.  1 Inhaler  2  . diphenhydrAMINE (BENADRYL) 12.5 MG/5ML liquid Take 5 mLs (12.5 mg total) by mouth 4 (four) times daily as needed for allergies.  480 mL  0  . mirtazapine (REMERON) 15 MG tablet Take 1 tablet (15 mg total) by mouth at bedtime.  30 tablet  0   No current facility-administered medications on file prior to visit.    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  HENT: Negative for congestion, sore throat, rhinorrhea, sneezing, trouble swallowing and sinus pressure.  Eyes: Negative for photophobia and visual disturbance.  Respiratory: Negative for cough, chest tightness, shortness of breath, wheezing and stridor.  Cardiovascular: Negative for chest pain, palpitations and leg swelling.  Gastrointestinal: Negative for nausea, vomiting, abdominal pain, diarrhea, constipation, blood in stool, abdominal distention and anal bleeding.  Genitourinary: Negative for dysuria, hematuria, flank pain and difficulty urinating.  Musculoskeletal: Negative for myalgias, back pain, joint swelling, arthralgias and gait problem.  Skin: + rash Neurological: Negative for dizziness, tremors, weakness and light-headedness.  Hematological: Negative for adenopathy. Does not bruise/bleed easily.  Psychiatric/Behavioral: Negative for behavioral problems, confusion, sleep disturbance, dysphoric mood, decreased concentration and agitation.       Objective:   Physical Exam BP 123/81  Pulse 94  Temp(Src) 98.2 F (36.8 C) (Oral)  Ht 5' 9.5" (1.765 m)  Wt 124 lb (56.246 kg)  BMI 18.06 kg/m2  LMP 06/18/2013 Physical Exam  Constitutional:  oriented to person, place, and time. appears well-developed and well-nourished. No distress.  HENT:  Mouth/Throat: Oropharynx is clear and moist. No oropharyngeal exudate.  Cardiovascular: Normal rate, regular rhythm and normal heart sounds. Exam reveals no gallop and no friction rub.  No murmur heard.  Pulmonary/Chest: Effort normal and breath sounds normal. No  respiratory distress.  has no wheezes.  Abdominal: Soft. Bowel sounds are normal.  exhibits no distension. There is no tenderness.  Lymphadenopathy: no cervical adenopathy.  Neurological: alert and oriented to person, place, and time.  Skin: Skin is warm and dry. Has handful of lesions painful, red, indurated. One cm lesion that  has mild drainage. Lesion to left chin is slowly healing but 1.5cm raised red furuncle Psychiatric: a normal mood and affect. behavior is normal.   Labs: reviewed labs from last visit including cd 4 count and viral load and geno listed in hpi    Assessment & Plan:  hiv = poorly controlled with multiple NNRTI drug resistance. too soon to check viral load. Only on most recent regimen for the past wk. Will have her come back in 6 wk for labs and 8 wks for visit with Dr. Linus Salmons. Encouraged her to coning with good adherence  Furuncle = will increase liquid bactrim to 65mL BID x 7 days.  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 Greater than 50% spent on counseling  rtc in 4-6 wk for labs

## 2013-06-30 ENCOUNTER — Other Ambulatory Visit: Payer: Self-pay | Admitting: Internal Medicine

## 2013-06-30 ENCOUNTER — Other Ambulatory Visit: Payer: Self-pay | Admitting: *Deleted

## 2013-06-30 DIAGNOSIS — L0292 Furuncle, unspecified: Secondary | ICD-10-CM

## 2013-06-30 DIAGNOSIS — B20 Human immunodeficiency virus [HIV] disease: Secondary | ICD-10-CM

## 2013-06-30 MED ORDER — RITONAVIR 80 MG/ML PO SOLN
100.0000 mg | Freq: Every day | ORAL | Status: DC
Start: 2013-06-30 — End: 2013-08-14

## 2013-06-30 MED ORDER — ZIDOVUDINE 10 MG/ML PO SYRP: 300.0000 mg | ORAL_SOLUTION | Freq: Two times a day (BID) | ORAL | Status: DC

## 2013-06-30 MED ORDER — DOLUTEGRAVIR SODIUM 50 MG PO TABS: 50.0000 mg | ORAL_TABLET | Freq: Every day | ORAL | Status: DC

## 2013-06-30 MED ORDER — DARUNAVIR ETHANOLATE 100 MG/ML PO SUSP: 800.0000 mg | Freq: Every day | ORAL | Status: DC

## 2013-06-30 NOTE — Telephone Encounter (Signed)
Error

## 2013-07-07 ENCOUNTER — Telehealth: Payer: Self-pay | Admitting: *Deleted

## 2013-07-07 NOTE — Telephone Encounter (Signed)
Patient wanted to know if she could re-enroll with Neldon Newport, RN.  Patient currently receiving [facility] services, has [place] as her case Freight forwarder.  RN asked patient to discuss her needs with Mitch. Pt agreed, stating that she is seeing him tomorrow at 10:30. Landis Gandy, RN

## 2013-08-14 ENCOUNTER — Other Ambulatory Visit: Payer: Self-pay | Admitting: *Deleted

## 2013-08-14 DIAGNOSIS — B20 Human immunodeficiency virus [HIV] disease: Secondary | ICD-10-CM

## 2013-08-14 MED ORDER — RITONAVIR 80 MG/ML PO SOLN: 100.0000 mg | Freq: Every day | ORAL | Status: DC

## 2013-08-14 MED ORDER — DOLUTEGRAVIR SODIUM 50 MG PO TABS: 50.0000 mg | ORAL_TABLET | Freq: Every day | ORAL | Status: DC

## 2013-08-14 MED ORDER — ZIDOVUDINE 10 MG/ML PO SYRP: 300.0000 mg | ORAL_SOLUTION | Freq: Two times a day (BID) | ORAL | Status: DC

## 2013-08-14 MED ORDER — DARUNAVIR ETHANOLATE 100 MG/ML PO SUSP: 800.0000 mg | Freq: Every day | ORAL | Status: DC

## 2013-08-14 NOTE — Telephone Encounter (Signed)
ADAP application 

## 2013-08-15 ENCOUNTER — Other Ambulatory Visit: Payer: Self-pay | Admitting: Infectious Diseases

## 2013-09-03 ENCOUNTER — Other Ambulatory Visit: Payer: Medicare Other

## 2013-09-11 ENCOUNTER — Telehealth: Payer: Self-pay | Admitting: *Deleted

## 2013-09-11 NOTE — Telephone Encounter (Signed)
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  Given appt for 09/16/13 w/ Dr. Johnnye Sima.

## 2013-09-12 ENCOUNTER — Other Ambulatory Visit: Payer: Self-pay | Admitting: Internal Medicine

## 2013-09-15 ENCOUNTER — Other Ambulatory Visit: Payer: Self-pay | Admitting: *Deleted

## 2013-09-15 DIAGNOSIS — B20 Human immunodeficiency virus [HIV] disease: Secondary | ICD-10-CM

## 2013-09-15 MED ORDER — RITONAVIR 80 MG/ML PO SOLN: 100.0000 mg | Freq: Every day | ORAL | Status: DC

## 2013-09-16 ENCOUNTER — Encounter: Payer: Self-pay | Admitting: Infectious Diseases

## 2013-09-16 ENCOUNTER — Ambulatory Visit (INDEPENDENT_AMBULATORY_CARE_PROVIDER_SITE_OTHER): Payer: Medicare Other | Admitting: Infectious Diseases

## 2013-09-16 VITALS — BP 118/90 | HR 94 | Temp 98.2°F | Ht 69.5 in | Wt 124.0 lb

## 2013-09-16 DIAGNOSIS — Z23 Encounter for immunization: Secondary | ICD-10-CM

## 2013-09-16 DIAGNOSIS — B20 Human immunodeficiency virus [HIV] disease: Secondary | ICD-10-CM

## 2013-09-16 DIAGNOSIS — A609 Anogenital herpesviral infection, unspecified: Secondary | ICD-10-CM

## 2013-09-16 DIAGNOSIS — A6000 Herpesviral infection of urogenital system, unspecified: Secondary | ICD-10-CM

## 2013-09-16 DIAGNOSIS — B009 Herpesviral infection, unspecified: Secondary | ICD-10-CM

## 2013-09-16 MED ORDER — ACYCLOVIR 200 MG/5ML PO SUSP: 400.0000 mg | Freq: Three times a day (TID) | ORAL | Status: DC

## 2013-09-16 MED ORDER — ACYCLOVIR 5 % EX OINT: 1.0000 "application " | TOPICAL_OINTMENT | CUTANEOUS | Status: DC

## 2013-09-16 NOTE — Addendum Note (Signed)
Addended by: Dolan Amen D on: 09/16/2013 05:14 PM   Modules accepted: Orders

## 2013-09-16 NOTE — Assessment & Plan Note (Signed)
Will check her CD4 and HIV RNA since she says she has been on her ART for last 10 days. She recently told Karn Pickler that she has not been taking her ART.  She gets flu shot today.  She is offered/refuses condoms.  Will see her back next month.

## 2013-09-16 NOTE — Progress Notes (Signed)
   Subjective:    Patient ID: Ollen Gross, female    DOB: [birth year], 38 y.o.   MRN: [id number]  Rash Pertinent negatives include no diarrhea or shortness of breath.   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 Also with recurrent MRSA furuncles, HSV.   Has been taking her ART for last 10 days. Was off due to stress. Has had difficulty getting food.   HIV 1 RNA Quant (copies/mL)  Date Value  06/03/2013 14497*  05/19/2013 3134*  11/07/2012 2082*     CD4 T Cell Abs (/uL)  Date Value  05/19/2013 60*  11/07/2012 140*  10/08/2012 110*   Review of Systems  Constitutional: Negative for appetite change and unexpected weight change.  Respiratory: Negative for choking and shortness of breath.   Gastrointestinal: Negative for diarrhea and constipation.  Genitourinary: Negative for difficulty urinating.  Skin: Positive for rash.       Objective:   Physical Exam  Constitutional: She appears well-developed and well-nourished.  HENT:  Mouth/Throat: No oropharyngeal exudate.  Eyes: EOM are normal. Pupils are equal, round, and reactive to light.  Neck: Neck supple.  Cardiovascular: Normal rate, regular rhythm and normal heart sounds.   Pulmonary/Chest: Effort normal and breath sounds normal.  Abdominal: Soft. Bowel sounds are normal. She exhibits no distension. There is no tenderness.  Lymphadenopathy:    She has no cervical adenopathy.  Skin:             Assessment & Plan:

## 2013-09-16 NOTE — Assessment & Plan Note (Signed)
Will refill her acyclovir topical and oral. Plan for 10 days.

## 2013-09-17 ENCOUNTER — Other Ambulatory Visit: Payer: Self-pay | Admitting: *Deleted

## 2013-09-17 NOTE — Telephone Encounter (Signed)
Prior Authorization request for acyclovir ointment.  Call to Medicare Part D provider, [facility] @ [phone].  There is a PA already completed an authorized through Dec. 2015 (#[id number]) but there is a quantity limit on this PA.  Quantity limit will be changed by [facility] and the pt will be able to obtain additional amount.  [facility] informed that PA rx quantity has been amended.

## 2013-09-18 ENCOUNTER — Ambulatory Visit: Payer: Medicare Other | Admitting: Internal Medicine

## 2013-09-19 LAB — HIV-1 RNA QUANT-NO REFLEX-BLD
HIV 1 RNA Quant: 488 copies/mL — ABNORMAL HIGH (ref <20)
HIV-1 RNA Quant, Log: 2.69 {Log} — ABNORMAL HIGH (ref <1.30)

## 2013-09-25 ENCOUNTER — Ambulatory Visit (INDEPENDENT_AMBULATORY_CARE_PROVIDER_SITE_OTHER): Payer: Medicare Other | Admitting: Internal Medicine

## 2013-09-25 ENCOUNTER — Encounter: Payer: Self-pay | Admitting: Internal Medicine

## 2013-09-25 VITALS — BP 111/79 | HR 93 | Temp 98.4°F | Wt 126.0 lb

## 2013-09-25 DIAGNOSIS — A609 Anogenital herpesviral infection, unspecified: Secondary | ICD-10-CM

## 2013-09-25 DIAGNOSIS — B009 Herpesviral infection, unspecified: Secondary | ICD-10-CM

## 2013-09-25 DIAGNOSIS — B20 Human immunodeficiency virus [HIV] disease: Secondary | ICD-10-CM

## 2013-09-25 NOTE — Progress Notes (Signed)
   Subjective:    Patient ID: Paula Becker, female    DOB: 1976/01/05, 38 y.o.   MRN: [id number]  Rash Pertinent negatives include no fever.   She comes in for a work in visit for same skin issues.  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  Painful outbreak of HSV on buttocks.  Has taken oral acyclovir and has cream.  Is uncomfortable.  Reports compliance with meds and l;as viral load actually 488, down from 14497.  Denies any missed doses, though never has been forthcoming on true compliance.     Review of Systems  Constitutional: Negative for fever.  HENT: Positive for trouble swallowing.   Gastrointestinal: Negative for nausea.  Skin: Positive for rash.  Neurological: Negative for dizziness.       Objective:   Physical Exam  Constitutional:  Chronically ill appearing  HENT:  Mouth/Throat: No oropharyngeal exudate.  Eyes: No scleral icterus.  Cardiovascular: Normal rate, regular rhythm and normal heart sounds.   No murmur heard. Pulmonary/Chest: Effort normal and breath sounds normal. No respiratory distress.  Lymphadenopathy:    She has no cervical adenopathy.          Assessment & Plan:

## 2013-09-25 NOTE — Assessment & Plan Note (Signed)
Improving again.  Has follow up labs with Dr. Johnnye Sima and appt and will repeat labs then.

## 2013-09-25 NOTE — Assessment & Plan Note (Signed)
Reiterated that compliance with ARVs is the only way it will eventually improve.  Acyclovir did not help.  May try topical acyclovir.

## 2013-10-22 ENCOUNTER — Encounter: Payer: Self-pay | Admitting: Infectious Diseases

## 2013-10-22 ENCOUNTER — Ambulatory Visit (INDEPENDENT_AMBULATORY_CARE_PROVIDER_SITE_OTHER): Payer: Medicare Other | Admitting: Infectious Diseases

## 2013-10-22 VITALS — BP 116/77 | HR 80 | Temp 98.4°F | Ht 69.5 in | Wt 130.0 lb

## 2013-10-22 DIAGNOSIS — B20 Human immunodeficiency virus [HIV] disease: Secondary | ICD-10-CM | POA: Diagnosis not present

## 2013-10-22 DIAGNOSIS — A609 Anogenital herpesviral infection, unspecified: Secondary | ICD-10-CM

## 2013-10-22 DIAGNOSIS — Z23 Encounter for immunization: Secondary | ICD-10-CM | POA: Diagnosis not present

## 2013-10-22 DIAGNOSIS — B009 Herpesviral infection, unspecified: Secondary | ICD-10-CM | POA: Diagnosis not present

## 2013-10-22 NOTE — Assessment & Plan Note (Signed)
Will cont her current rx. Gets flu shot today. Will start Hep B series as well. Will check her HIV RNA today as well.  rtc 4 months.

## 2013-10-22 NOTE — Progress Notes (Signed)
   Subjective:    Patient ID: Paula Becker, female    DOB: 1975/02/12, 38 y.o.   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 Also with recurrent MRSA furuncles, HSV.   HIV 1 RNA Quant (copies/mL)  Date Value  09/16/2013 488*  06/03/2013 14497*  05/19/2013 3134*     CD4 T Cell Abs (/uL)  Date Value  05/19/2013 60*  11/07/2012 140*  10/08/2012 110*   Taking DTGV/DRVr/AZT. Misses about 1/week. Has been taking in AM and doing a better job remembering to take. Feels like her previous episode of HSV has been clearing up, drying.   Last PAP?  ? Hep B vax- S Ab (-) 2012.  Gets flu shot today.  Refuses condoms.   Review of Systems  Constitutional: Negative for appetite change and unexpected weight change.  Gastrointestinal: Negative for diarrhea and constipation.  Genitourinary: Negative for difficulty urinating.       Objective:   Physical Exam  Constitutional: She appears well-developed and well-nourished.  HENT:  Mouth/Throat: No oropharyngeal exudate.  Eyes: EOM are normal. Pupils are equal, round, and reactive to light.  Neck: Neck supple.  Cardiovascular: Normal rate, regular rhythm and normal heart sounds.   Pulmonary/Chest: Effort normal and breath sounds normal.  Abdominal: Soft. Bowel sounds are normal.  Genitourinary:     Lymphadenopathy:    She has no cervical adenopathy.          Assessment & Plan:

## 2013-10-22 NOTE — Assessment & Plan Note (Signed)
Appears to be healing well. No further rx at this time.

## 2013-10-23 NOTE — Addendum Note (Signed)
Addended by: Landis Gandy on: 10/23/2013 10:12 AM   Modules accepted: Orders

## 2013-10-24 ENCOUNTER — Ambulatory Visit: Payer: Medicare Other

## 2013-10-24 LAB — HIV-1 RNA ULTRAQUANT REFLEX TO GENTYP+
HIV 1 RNA QUANT: 1105 {copies}/mL — AB (ref <20)
HIV-1 RNA QUANT, LOG: 3.04 {Log} — AB (ref <1.30)

## 2013-10-29 LAB — HIV-1 INTEGRASE GENOTYPE

## 2013-10-31 ENCOUNTER — Ambulatory Visit: Payer: Medicare Other

## 2013-10-31 ENCOUNTER — Telehealth: Payer: Self-pay | Admitting: *Deleted

## 2013-10-31 NOTE — Telephone Encounter (Signed)
Will combine Hep B #2 nurse visit w/ PAP smear 11/18/13

## 2013-11-06 LAB — HIV-1 GENOTYPR PLUS

## 2013-11-14 ENCOUNTER — Other Ambulatory Visit (HOSPITAL_COMMUNITY)
Admission: RE | Admit: 2013-11-14 | Discharge: 2013-11-14 | Disposition: A | Discharge status: Home / Self Care | Payer: Medicare Other | Source: Ambulatory Visit | Attending: Infectious Diseases | Admitting: Infectious Diseases

## 2013-11-14 ENCOUNTER — Ambulatory Visit (INDEPENDENT_AMBULATORY_CARE_PROVIDER_SITE_OTHER): Payer: Medicare Other | Admitting: *Deleted

## 2013-11-14 DIAGNOSIS — Z124 Encounter for screening for malignant neoplasm of cervix: Secondary | ICD-10-CM | POA: Insufficient documentation

## 2013-11-14 DIAGNOSIS — N760 Acute vaginitis: Secondary | ICD-10-CM | POA: Insufficient documentation

## 2013-11-14 DIAGNOSIS — Z113 Encounter for screening for infections with a predominantly sexual mode of transmission: Secondary | ICD-10-CM | POA: Diagnosis present

## 2013-11-14 NOTE — Progress Notes (Signed)
  Subjective:     Paula Becker is a 38 y.o. woman who comes in today for a  pap smear only.  Previous abnormal Pap smears: yes. Contraception: condoms.  Objective:    LMP 10/23/13 Pelvic Exam:  Pap smear obtained.   Assessment:    Screening pap smear.   Plan:    Follow up in one year, or as indicated by Pap results.  Pt given educational materials re: HIV and women, self-esteem, BSE, nutrition and diet management, PAP smears and partner safety. Pt given condoms.

## 2013-11-14 NOTE — Patient Instructions (Signed)
Your results will be ready in about a week.  I will mail them to you.  Thank you for coming to the Center for your care.  Denise,  RN 

## 2013-11-17 LAB — CYTOLOGY - PAP

## 2013-11-18 ENCOUNTER — Encounter: Payer: Self-pay | Admitting: *Deleted

## 2013-11-20 ENCOUNTER — Ambulatory Visit (INDEPENDENT_AMBULATORY_CARE_PROVIDER_SITE_OTHER): Payer: Medicare Other | Admitting: *Deleted

## 2013-11-20 DIAGNOSIS — Z23 Encounter for immunization: Secondary | ICD-10-CM

## 2013-11-20 DIAGNOSIS — L281 Prurigo nodularis: Secondary | ICD-10-CM | POA: Diagnosis not present

## 2013-11-20 DIAGNOSIS — L700 Acne vulgaris: Secondary | ICD-10-CM | POA: Diagnosis not present

## 2013-12-20 ENCOUNTER — Other Ambulatory Visit: Payer: Self-pay | Admitting: Internal Medicine

## 2014-01-17 ENCOUNTER — Other Ambulatory Visit: Payer: Self-pay | Admitting: Infectious Diseases

## 2014-01-17 ENCOUNTER — Other Ambulatory Visit: Payer: Self-pay | Admitting: Internal Medicine

## 2014-02-19 ENCOUNTER — Ambulatory Visit: Payer: Medicare Other | Admitting: Infectious Diseases

## 2014-03-03 ENCOUNTER — Ambulatory Visit: Payer: Medicare Other | Admitting: Infectious Diseases

## 2014-03-04 ENCOUNTER — Ambulatory Visit (INDEPENDENT_AMBULATORY_CARE_PROVIDER_SITE_OTHER): Payer: Medicare Other | Admitting: Infectious Diseases

## 2014-03-04 ENCOUNTER — Encounter: Payer: Self-pay | Admitting: Infectious Diseases

## 2014-03-04 VITALS — BP 116/87 | HR 85 | Temp 98.3°F | Wt 129.0 lb

## 2014-03-04 DIAGNOSIS — B20 Human immunodeficiency virus [HIV] disease: Secondary | ICD-10-CM

## 2014-03-04 DIAGNOSIS — Z113 Encounter for screening for infections with a predominantly sexual mode of transmission: Secondary | ICD-10-CM

## 2014-03-04 DIAGNOSIS — Z79899 Other long term (current) drug therapy: Secondary | ICD-10-CM

## 2014-03-04 NOTE — Assessment & Plan Note (Signed)
She states that she needs certain foods for her meds, I offered to help her with this but she did not want.  Offered to help her get home RN.  Offered/refused condoms.

## 2014-03-04 NOTE — Progress Notes (Signed)
   Subjective:    Patient ID: Paula Becker, female    DOB: 09/03/1975, 38 y.o.   MRN: [id number]  HPI 38 yo F with HIV/AIDS, multidrug resistant virus, poor adherence/compliance. Also with recurrent MRSA furuncles, HSV.   Having trouble with rent, car. Has met with [facility].  Feels terrible- emotionally.  Has been off ART for 2 months due to stress.   HIV 1 RNA QUANT (copies/mL)  Date Value  10/22/2013 1105*  09/16/2013 488*  06/03/2013 14497*   CD4 T CELL ABS (/uL)  Date Value  05/19/2013 60*  11/07/2012 140*  10/08/2012 110*   PAP nl October 2015  Review of Systems  Constitutional: Positive for appetite change. Negative for unexpected weight change.  Gastrointestinal: Negative for diarrhea and constipation.  Genitourinary: Positive for menstrual problem. Negative for difficulty urinating.       Irregular menses.       Objective:   Physical Exam  Constitutional: She appears well-developed and well-nourished.  HENT:  Mouth/Throat: No oropharyngeal exudate.  Eyes: EOM are normal. Pupils are equal, round, and reactive to light.  Neck: Neck supple.  Cardiovascular: Normal rate, regular rhythm and normal heart sounds.   Pulmonary/Chest: Effort normal and breath sounds normal.  Abdominal: Soft. Bowel sounds are normal. There is no tenderness. There is no rebound.  Lymphadenopathy:    She has no cervical adenopathy.          Assessment & Plan:   

## 2014-03-13 DIAGNOSIS — L281 Prurigo nodularis: Secondary | ICD-10-CM | POA: Diagnosis not present

## 2014-03-17 ENCOUNTER — Telehealth: Payer: Self-pay | Admitting: *Deleted

## 2014-03-17 NOTE — Telephone Encounter (Signed)
Toothache and headaches.  Requesting appt.  Pt given appt with Dr. Johnnye Sima.  Pt advised to call Secretary for the [facility] to set up appt.  Pt given phone number.

## 2014-03-19 ENCOUNTER — Ambulatory Visit: Payer: Medicare Other | Admitting: Infectious Diseases

## 2014-03-20 ENCOUNTER — Ambulatory Visit (INDEPENDENT_AMBULATORY_CARE_PROVIDER_SITE_OTHER): Payer: Medicare Other | Admitting: Infectious Diseases

## 2014-03-20 ENCOUNTER — Encounter: Payer: Self-pay | Admitting: Infectious Diseases

## 2014-03-20 VITALS — BP 120/78 | HR 88 | Temp 98.7°F | Wt 130.0 lb

## 2014-03-20 DIAGNOSIS — B20 Human immunodeficiency virus [HIV] disease: Secondary | ICD-10-CM

## 2014-03-20 DIAGNOSIS — L281 Prurigo nodularis: Secondary | ICD-10-CM

## 2014-03-20 MED ORDER — DIPHENHYDRAMINE HCL 25 MG PO CAPS: 25.0000 mg | ORAL_CAPSULE | Freq: Four times a day (QID) | ORAL | Status: DC | PRN

## 2014-03-20 MED ORDER — HYDROXYZINE HCL 10 MG PO TABS: 10.0000 mg | ORAL_TABLET | Freq: Three times a day (TID) | ORAL | Status: DC | PRN

## 2014-03-20 NOTE — Progress Notes (Signed)
   Subjective:    Patient ID: Paula Becker, female    DOB: 19-Jun-1975, 39 y.o.   MRN: [id number]  HPI 39 yo F with HIV/AIDS, multidrug resistant virus, poor adherence/compliance. Also with recurrent MRSA furuncles, HSV.   HIV 1 RNA QUANT (copies/mL)  Date Value  10/22/2013 1105*  09/16/2013 488*  06/03/2013 14497*   CD4 T CELL ABS (/uL)  Date Value  05/19/2013 60*  11/07/2012 140*  10/08/2012 110*   C/o breaking out, itching for last 2 weeks. No fever or chills but did have tooth ache.  No trouble with swallowing or breathing.  No new medicines. No new soap, detergents, or shampoos.  Has taken no OTCs.  Has previously taken creams (ACV) as given to her by derm. States that she saw derm last week.  Has DTGV/RTV. Has not been taking liquids.   Review of Systems  Constitutional: Negative for appetite change.  HENT: Negative for mouth sores.   Respiratory: Negative for shortness of breath.        Objective:   Physical Exam  Constitutional: She appears well-developed and well-nourished.  HENT:  Mouth/Throat: No oropharyngeal exudate.  Eyes: EOM are normal. Pupils are equal, round, and reactive to light.  Neck: Neck supple.  Cardiovascular: Normal rate, regular rhythm and normal heart sounds.   Pulmonary/Chest: Effort normal and breath sounds normal.  Abdominal: Soft. Bowel sounds are normal. She exhibits no distension. There is no tenderness.  Lymphadenopathy:    She has no cervical adenopathy.  Skin:             Assessment & Plan:

## 2014-03-20 NOTE — Assessment & Plan Note (Signed)
Has been seen by derm.  Given steroid cream (temovate, cutivate?).  Will give her benardyl, atarax.  rtc in 1 month.

## 2014-03-20 NOTE — Assessment & Plan Note (Signed)
advised her to stop all her ART unless she takes it synchronously.  Will see her back in 1 month.  She needs bridge

## 2014-03-23 ENCOUNTER — Other Ambulatory Visit: Payer: Self-pay | Admitting: *Deleted

## 2014-03-23 DIAGNOSIS — B20 Human immunodeficiency virus [HIV] disease: Secondary | ICD-10-CM

## 2014-03-24 ENCOUNTER — Other Ambulatory Visit: Payer: Self-pay

## 2014-03-27 ENCOUNTER — Telehealth: Payer: Self-pay | Admitting: *Deleted

## 2014-03-27 NOTE — Telephone Encounter (Signed)
RN contacted Ms. Shartzer at above number listed. Spoke with the patient and explained to her that Dr Johnnye Sima requested a [facility] Nurse began visiting the patient. The plan at this time is to assist the patient in caring for herself along with her disease process. Patient stated she would be willing to have a RN visit and stated she currently has a skin outbreak that she would like to have a nurse assess. RN scheduled a visit time for Monday(02/29) between the hours of 11-11:30. Patient agreed to this time. Home address and phone number have been verified. Pt did state the current number listed is not the best number to contact. Her current cell number is [phone] and home number is [phone].

## 2014-03-27 NOTE — Telephone Encounter (Signed)
RN contacted Paula Becker at above number listed. Spoke with the patient and explained to her that Dr Paula Becker requested a [facility] Nurse began visiting the patient. The plan at this time is to assist the patient in caring for herself along with her disease process. Patient stated she would be willing to have a RN visit and stated she currently has a skin outbreak that she would like to have a nurse assess. RN scheduled a visit time for Monday(02/29) between the hours of 11-11:30. Patient agreed to this time. Home address and phone number have been verified. Pt did state the current number listed is not the best number to contact. Her current cell number is [phone] and home number is [phone].

## 2014-03-28 ENCOUNTER — Other Ambulatory Visit: Payer: Self-pay | Admitting: Infectious Diseases

## 2014-03-28 ENCOUNTER — Other Ambulatory Visit: Payer: Self-pay | Admitting: Internal Medicine

## 2014-03-28 DIAGNOSIS — B20 Human immunodeficiency virus [HIV] disease: Secondary | ICD-10-CM

## 2014-03-30 ENCOUNTER — Encounter: Payer: Self-pay | Admitting: *Deleted

## 2014-03-30 ENCOUNTER — Encounter: Payer: Self-pay | Admitting: Pharmacist Clinician (PhC)/ Clinical Pharmacy Specialist

## 2014-03-30 ENCOUNTER — Other Ambulatory Visit: Payer: Self-pay | Admitting: *Deleted

## 2014-03-30 NOTE — Progress Notes (Signed)
Patient ID: Paula Becker, female   DOB: September 12, 1975, 39 y.o.   MRN: [id number] Documenting resistance profile:  HIV Genotype Composite Data Genotype Dates:   Mutations in Bold impact drug susceptibility RT Mutations L74I, M184V, K219Q, K101E, V106I, V179F, Y181C, G190A, H221Y  PI Mutations None  Integrase Mutations None   Interpretation of Genotype Data per [employer] HIV Database Nucleoside RTIs  lamivudine (3TC) High-level resistance abacavir (ABC) High-level resistance zidovudine (AZT) Susceptible stavudine (D4T) Susceptible didanosine (DDI) High-level resistance emtricitabine (FTC) High-level resistance tenofovir (TDF) Susceptible   Non-Nucleoside RTIs  efavirenz (EFV) High-level resistance etravirine (ETR) High-level resistance nevirapine (NVP) High-level resistance rilpivirine (RPV) High-level resistance   Protease Inhibitors  atazanavir/r (ATV/r) Susceptible darunavir/r (DRV/r) Susceptible fosamprenavir/r (FPV/r) Susceptible indinavir/r (IDV/r) Susceptible lopinavir/r (LPV/r) Susceptible nelfinavir (NFV) Susceptible saquinavir/r (SQV/r) Susceptible tipranavir/r (TPV/r) Susceptible   Integrase Inhibitors  Not predicted

## 2014-03-31 ENCOUNTER — Encounter: Payer: Self-pay | Admitting: *Deleted

## 2014-04-01 ENCOUNTER — Encounter: Payer: Self-pay | Admitting: *Deleted

## 2014-04-01 ENCOUNTER — Telehealth: Payer: Self-pay | Admitting: *Deleted

## 2014-04-01 NOTE — Telephone Encounter (Signed)
Paula Becker returned my call stating she was on her way to return some bras she orders. Questioned the patient on if prefilling the syringes worked for her. Pt stated she plans to start taking her medications today at 4pm but would like for me to get additional syringes for her. Informed the patient that I will look into getting more syringes after we are sure that prefilling the syringes is helping her follow her regimen. Pt stated she understood but was focused on returning her bras at the current time. Scheduled our next in home visit for Friday (03/04)

## 2014-04-01 NOTE — Progress Notes (Signed)
Community health nurse visit performed today from 11:10am-1:10pm. [facility] Referral received due to concerns related to elevated viral load and the increased risk of opportunistic infections.  Subjectively the patient stated she understands that she has HIV but continues to have difficulty with accepting why her ex-boyfriend did not tell her of his status. Admits to not taking medication consistently and when she does take her medication she only takes Tivicay and Norvir. She did become upset with the Doctor because he told her not to take any of the medications if she did not take the complete regimen daily. During our visit she states she has several open herpes sores (anal and peritoneal region) that are painful and tender to touch. She has seen her dermatologist with a new prescription written, but [facility] refused to fill the prescription. Currently she feels that people look at her and think she is unclean and a risk to their health. Ms. Donovan states she received very good care in [place], but not here in [place]. At this time she would like to have a nurse come to her home daily and remind her to take her medications. Ms. Linhart stated she had that service in [place] and it worked very well for her.  Currently she states she does not feel cared for the way she should by our local clinic. Patient states she "hates" coming to the clinic and would like to transfer to a different clinic after she secures housing.  At this time she feels unsupported by her local family because they are very jealous of her. She states her 58yr old son lives with her and is like "3 kids rolled into one" meaning he is a lot to handle. Her current support comes from a friend that assist with transportation, gives her money daily, and provides materialistic items if she needs it.  Objectively upon RN arrival Ms. Terlecki was being dropped off by her friend. Her home was unorganized but Therapist, sports. She was appropriately  dressed and groomed. Ms. Acocella was pleasant to speak with and eager to share her story. It appears that she has trouble saying HIV positive and refers to her virus as "people with this". During our conversation she relayed feelings of not being supported, but had difficulty owning responsibility for her current health. She appears to have low self-esteem and little hope for getting better. Pt did state she has trouble managing her bills along with providing food for her family.  It was observed that the patient may have some nutritional concerns. While assisting the patient with her medications it was noted that she only had a ready-made salad mix, milk, and orange juice in the refrigerator. Boxes of cereal were noted above the fridge.  Assessment Physically Ms. Biebel appeared frail. Vitals 122/76 (left arm, sitting), 88 (HR), 96.3 (oral temp) 18 (resp) HR normal and regular. Lung sounds slightly diminished on the left (lower greater than upper). Pt was noted to have a wet cough without any exudate noted. Pt denies any SOB. Pt denies any current diarrhea. Pt's skin has several open non oozing sores on her upper back(which she says is shingles) and her anus(which she says is herpes) Pt states she is tolerating food well without any observed sores or white patches inside her mouth. Perfusion assess and not noted to have any deficits. Pt stated her current goal is "I want a Doctor that will help me and not tell me I am going to die." RN asked the pt what  specific goal does she have for herself and she stated "I want to take me medications every day, see my son graduate and live" Asked the pt if she feels she has a purpose.  She had difficulty answering this question. Plan at this time is to f/u with the pharmacist and get a current regimen list. Tomorrow RN will return to prefill syringes with oral AZT and Prezista for the week and prefill a medication box for the patient. Pt's current unfilled scripts from the  dermatologist while be taken to [facility] for refill. RN will create Ms. Dedmon a current medication list to follow. Explained to Ms. Bovenzi the intent for the Dr telling her not to take her medication if she was not taking the entire regimen. Educated the pt on what a genotype/resistance test is and the risk of stopping and starting regimens. Advised the pt not to take her medications until after we are sure of her current regimen. My current goal is to provide support to the patient as a resource to help her reach her goals and find a purpose. Pt has a scheduled appt with [facility] housing coordinator, Museum/gallery conservator, on March 3rd, 2016 (Thursday). RN handed Ms. Lamia a list all the things she needs to bring to her housing appt. Noted concerns are medication adherence, nutrition, psychosocial support, and financial struggles.

## 2014-04-01 NOTE — Telephone Encounter (Signed)
Contacted the patient and left a detailed message stating the reason for my call. The reason for my call was to f/u on how the prefilled syringes worked for her. Waiting on a return call.

## 2014-04-01 NOTE — Progress Notes (Signed)
Established plan from 03/30/14: at this time is to f/u with the pharmacist and get a current regimen list. Tomorrow RN will return to prefill syringes for the week and prefill a medication box for the patient. Pt's current unfilled scripts from the dermatologist while be taken to [facility] for refill. RN will create Ms. Gayman a current medication list to follow. My current goal is to provide support to the patient as a resource to help her reach her goals and find a purpose. Noted concerns are medication adherence, nutrition, psychosocial support, and financial struggles.   03/31/14 RN returned to the patient's home with a current regimen list and a medication list for the patient. RN set up a weekly pill box for the patient that included Tivicay (50mg ) once a day, Norvir  (100mg  tablet) once a day. RN prefilled 96ml syringes with 46ml of AZT and instructed the pt that she is to take 3 syringes orally two times a day. RN prefilled syringes with 56ml of Prezista and instructed the pt to take one syringe daily. Prefilled syringes were placed in a cup inside her refrigerator with labels on each cup explaining how many and when to take the medication. Pt given 2 new creams from her Dermatologist (Cutivate and [facility]). Instructed the pt on what body parts to apply the medication to and not to exceed twice a day per order. Questioned the pt on if she could commit to taking her medications daily and if not that is her choose. Pt stated she wants to take her medication starting "right now". I asked the patient if we can go through taking her medications while I was still present. Patient declined saying she will just start this afternoon and at 9am tomorrow morning. Reminded the pt on her stated goals and asked if there was anything more I could do to help her be successful. Pt did not give any other suggestions.  Instructed the pt that at 9am tomorrow morning I will f/u with her by phone to see how her afternoon went  and to remind her to take her morning medications. Advised the patient that if the prefilled syringe system works I will attempt to get her enough for the week. Pt thanked me stating she would talk with me tomorrow. Pt was noted to have more medication than she has room in her refrigerator.   Current Plan for next communication with the patient will be by phone. RN will contact the pt at 9am tomorrow morning to assess if the medication system worked for her. Pt states she feels discouraged by have to draw the medications up independently.Pt asked if we could get more syringes for me to prefill. Instructed the pt that I would like to first see if prefilling the syringes is easier for her to follow before getting more supplies. Pt vebalized understanding.  Next planned in home visit is for Friday (03/04)

## 2014-04-01 NOTE — Telephone Encounter (Signed)
Received call from patient stating she is not a morning person and would like to reschedule or 9-9:30 appt for today to this afternoon. RN offered a new  Home visit appt time for this afternoon between 1:30-2pm. Pt accepted this scheduled time.

## 2014-04-03 ENCOUNTER — Encounter: Payer: Self-pay | Admitting: *Deleted

## 2014-04-06 ENCOUNTER — Telehealth: Payer: Self-pay | Admitting: *Deleted

## 2014-04-07 ENCOUNTER — Other Ambulatory Visit: Payer: Self-pay | Admitting: *Deleted

## 2014-04-07 ENCOUNTER — Encounter: Payer: Self-pay | Admitting: *Deleted

## 2014-04-07 NOTE — Patient Instructions (Signed)
Pt instructed on the need to follow a health minimally processed diet to decrease the risk of infection. Instructed the pt on the importance of eating 3 meals a day that include a protein, carb(strach) and vegetables. Pt "eats out" a lot. This is also a concern since this may later contribute to a elevation in her cholesterol.   Instructed the patient on the importance of complying with her medication regimen daily to decrease the risk of medication resistance. Pt verbalized understanding but continues to need reassurance and education.

## 2014-04-07 NOTE — Progress Notes (Signed)
RN visit conducted today for continued evaluation of patient needs and medication compliance. Pt has not started her medication regimen yet but is in agreement to begin during today's RN visit. Pt does have more food in the home but stated she lives the responsibility to cook with her son since he is of age to [place] for himself. Skilled provided during today visit setting up a medication time reminder on the patient's phone. The iphone app that was used is through [email or web address]-[email or web address] Application was sent to remind the patient at 10am and 8pm that her medication is now due. RN also prefilled additional syringes with zidovidine (3 5ml syringes for twice a day) and Prezista (1syringe with 52ml once a day). Pt is able to verbalize what to take at 10am each day to include home many syringes to get. Pt also verbalized that in the evening (8pm) she is only to take the 3 77ml syringes of Zidovidine. Pt began to take her oral medications but is struggling to swallow the medication. After 2 56ml syringes of Zidovidine the pt stopped stating she needed a break. RN gave verbal cues and encouragement while the patient took her medication.

## 2014-04-09 ENCOUNTER — Encounter: Payer: Self-pay | Admitting: *Deleted

## 2014-04-10 ENCOUNTER — Telehealth: Payer: Self-pay | Admitting: *Deleted

## 2014-04-10 NOTE — Progress Notes (Signed)
SN visit performed today 04/09/14 pt stated she has not been complying with her medication regimen because her son has been upsetting her. Informed the patient that I had consulted with Minh(pharmacist) on switching her prezista and zidovidine back to a pill form. Pharmacist stated we could switch back but both medication should not be crushed so we may have to change her regimen. Pt declined stating she would rather stick with what she has. Pt states the syringes are overwhelming for her. She states she can take the 35ml Prezista but the 3 syringes of Zidovidine is a struggle for her. Pt has a small unlabeled liquid prescription bottle. Marked the 35ml mark with a black permanent marker for the patient. RN instructed the patient to fill the bottle up to the black line with the liquid zidovidine twice a day to make it easier for her. Pt stated that may work and she will began to take the medication starting today. Pt did state she received a Printmaker for 649.00 but does not feel that is enough. Pt states she cannot locate a home within that amount that does not put her safety at risk. Asked the pt if it was a possibility to rent a trailer in the same division as her father. RN's thought is she will be closer to her support system that way. Pt stated she tried but the complex will not accept vouchers.  RN offered emotional support and encouragement to the patient. Instructed the patient that we cannot always change people but we can change how we react to them. Pt stated she understood stating she knows she needs to take her medication even when her son is "acting up". Reinforced to the patient that I care and would love to continue to f/u with her. Next RN home visit scheduled for Monday (03/14) and Thursday (03/17)

## 2014-04-10 NOTE — Progress Notes (Signed)
Loma Boston, RN Tue Apr 07, 2014 10:04 AM  Order received on 03/27/14 by Dr Bobby Rumpf to evaluate patient for [facility]).  Patient was evaluated on 03/30/14 for [facility]. Patient was consented to care at this time.   Frequency / Duration of CBHCN visits effective 03/30/14 of 3w1, 2w4, 1w3, 77mo1 with 3 PRN visits for change in medication regiment, complications with medication regimen, or complications with HIV progression. [facility] will assess for learning needs related to diagnosis and treatment regimen, provide education as needed, fill pill box weekly or as needed, prefill syringes if liquid medications is required as needed, communicate with care team including physician and case managers.  Individualized Plan Of Care   Certification Period of 03/30/14-06/28/2014 a. Type of service(s) and care to be delivered: [facility] Nurse b. Frequency and duration of service: 3w1, 2w4, 1w3, 32mo1 with 3 PRN visits for change in medication regiment, complications with medication regimen, or complications with HIV progression. c. Activity restrictions: no noted activity restrictions, pt can get up as tolerated without a assistive device safely d. Safety Measures: Standard Precautions/Infection Control e. Service Objectives and Goals:. It appears that she has trouble saying HIV positive and refers to her virus as "people with this". During our conversation she relayed feelings of not being supported, but had difficulty owning responsibility for her current health. She appears to have low self-esteem and little hope for getting better. Pt did state she has trouble managing her bills along with providing food for her family. It was observed that the patient may have some nutritional concerns. While assisting the patient with her medications it was noted that she only had a ready-made salad mix, milk, and orange juice in the refrigerator. Boxes of  cereal were noted above the fridge. Patient Centered Goal at this time is to "have a Dr. Parks Ranger will help me and not tell me that I am going to die" also "I want to take my medicine every day and live" RN's plan to assist the patient in meeting her goals by helping her define and establish a sense of purpose through offering a support system. Develop a medication system that is simple for the patient to comply with and follow. Further evaluate nutritional needs and possible concerns. Assist the patient with linking with available community resources by making referrals as needed to address her Housing, management of bills and food. f. Equipment required: No additional Equipment needed at this time g. Functional Limitations: Vision h. Rehabilitation potential: Guarded i. Diet and Nutritional Needs: Regular Diet j. Medications and treatments: Have been reconciled for accuracy and are listed in EPIC k. Specific therapies if needed: Not applicable l. Pertinent diagnoses: HIV (CD4 <100), recurrent pneumonia, HSV, NonCompliance with medication regimen, severe protein-calorie malnutrition m. Expected outcome: Poor

## 2014-04-10 NOTE — Telephone Encounter (Signed)
Contacted the patient on 04/08/14 to schedule SN visit. Pt declined a visit stating she is out buying furniture and is not sure what time she will be returning. Pt stated she will be available for a home visit tomorrow at 2:30. Visit confirmed at that time. Pt went further to ask if she could receive her medication in a pill form since the liquid form is difficult for her. Instructed the pt that I will be sure to f/u with the pharmacist and discuss it during our appt on Thursday. Pt thanked me for my call at this time and call was ended. MD will be notified since this will alter the pt's planned/ordered frequency

## 2014-04-13 ENCOUNTER — Telehealth: Payer: Self-pay | Admitting: *Deleted

## 2014-04-13 ENCOUNTER — Encounter: Payer: Self-pay | Admitting: *Deleted

## 2014-04-13 NOTE — Telephone Encounter (Signed)
Pt called stating she wants to arrange another time to visit for today. Pt states she won't be available at 11am but will be available at 3. New visit time arranged for 3pm today

## 2014-04-14 NOTE — Patient Instructions (Signed)
Please refer to progress note.

## 2014-04-14 NOTE — Progress Notes (Signed)
Home Visit conducted today in the patient's home from 3:15 to 4pm. Pt noted to be feeling well but states she is have some stomach upset from her medications. Pt states she is not complying with her medication regimen daily but will like to make a attempt. RN as requested refilled the patient's syringes with Prezista (69ml) for the next 10 days. Instructed the patient that her medication is a combination and to try hard to take it together. Assked the patient if she can think of anything I can do to assist or f/u on for her. Pt declined and stated right now she is focused on running a scheme on the furniture store to get additional furniture for her new appt. The patient did state her voucher for housing is not enough and she would rather pay out of pocket for the apt that she wants and sale her voucher to her cousin that needs it. RN listened and choose not to add additional input or correct these actions since my focus at this time to to build a rapport with the patient. RN did remind the patient that my focus is to best help her to be her best and to reach the goal of taking her medication daily as prescribed. Pt verbalized understanding of education provided. Pt states she is planning to move will be in her new appt ([facility] [address]) by or next appt (Thursday/04/16/14).

## 2014-04-16 ENCOUNTER — Telehealth: Payer: Self-pay | Admitting: *Deleted

## 2014-04-21 ENCOUNTER — Ambulatory Visit: Payer: Medicare Other

## 2014-04-21 ENCOUNTER — Telehealth: Payer: Self-pay | Admitting: *Deleted

## 2014-04-22 ENCOUNTER — Ambulatory Visit (INDEPENDENT_AMBULATORY_CARE_PROVIDER_SITE_OTHER): Payer: Medicare Other | Admitting: *Deleted

## 2014-04-22 DIAGNOSIS — Z23 Encounter for immunization: Secondary | ICD-10-CM

## 2014-04-22 DIAGNOSIS — A6000 Herpesviral infection of urogenital system, unspecified: Secondary | ICD-10-CM

## 2014-04-22 DIAGNOSIS — A609 Anogenital herpesviral infection, unspecified: Secondary | ICD-10-CM

## 2014-04-22 MED ORDER — ACYCLOVIR 5 % EX OINT: 1.0000 "application " | TOPICAL_OINTMENT | CUTANEOUS | Status: DC

## 2014-04-22 MED ORDER — ACYCLOVIR 200 MG/5ML PO SUSP: 400.0000 mg | Freq: Three times a day (TID) | ORAL | Status: DC

## 2014-04-22 NOTE — Progress Notes (Signed)
Pt complained of "herpes" outbreak, buttocks and sacral area.  Will refill herpes genital medications.  Pt advised to pick them up from [facility] and start them per the package instructions.  Pt verbalized understanding.  Her son will drive her to pick them up.

## 2014-04-24 ENCOUNTER — Telehealth: Payer: Self-pay | Admitting: *Deleted

## 2014-04-28 ENCOUNTER — Encounter: Payer: Self-pay | Admitting: *Deleted

## 2014-04-28 NOTE — Patient Instructions (Signed)
Please refer to progress note.

## 2014-04-28 NOTE — Progress Notes (Signed)
Patient ID: Paula Becker, female   DOB: 1976/01/05, 39 y.o.   MRN: [id number] Chart review completed before initiating visit with the patient and it was noted that she continues to have Herpes Outbreaks that are very uncomfortable to her buttocks. RN contacted the patient on yesterday(04/27/14) via phone to f/u on her concerns. RN spoke with the patient and she asked "Why do I continue to have these things on me?" RN instructed the patient that when the Herpes virus is left untreated and her immunity is low she will most likely continue to have outbreaks. Pt stated she understands but the valtrex makes her sick on her stomach. Pt confirmed that she continues to not take her medication due to the stress of her move, her son not listening and being schemed out of 482 dollars. RN confirmed our visit for today during this telephone call.  RN conducted a home visit in the patient's new address of [address] in [place]) from 11:30am to 12:30pm. During today's visit the RN was able to meet her son but unable to include him in the visit due to his friend being present. Pt's vitals and systemic assessment were benign with the exception of her Herpes Outbreak. RN educated the patient that she can try to apply baking soda to her open herpes outbreaks as a drying agent to help the areas dry faster. Instructed the patient that outside of medication this is the only other suggestion that I am aware of. Advised the patient that baking soda is suggested but not supported by research. Pt verbalized understanding. Pt was very hesitant and we wrote out communication instead of verbally. Pt did not want her son's friend to [employer] our conversation.   During our visit patient stated she has a MD appt on 3/31 and she is concerned that lab work will be done. Pt stated she did not want the lab work to show that she has not been taking her medication. Advised the patient that we are here to help her not judge her. Instructed  the patient to be honest with the Dr about not taking her medication. Pt acknowledged my advice.   RN noted the pt's appt is 3/30 not 3/31. RN contacted the pt via text and made her aware that her appt is tomorrow with Dr Johnnye Sima and including the time

## 2014-04-29 ENCOUNTER — Encounter: Payer: Self-pay | Admitting: Infectious Diseases

## 2014-04-29 ENCOUNTER — Ambulatory Visit (INDEPENDENT_AMBULATORY_CARE_PROVIDER_SITE_OTHER): Payer: Medicare Other | Admitting: Infectious Diseases

## 2014-04-29 VITALS — BP 116/82 | HR 71 | Temp 98.4°F | Ht 69.5 in | Wt 136.0 lb

## 2014-04-29 DIAGNOSIS — E43 Unspecified severe protein-calorie malnutrition: Secondary | ICD-10-CM

## 2014-04-29 DIAGNOSIS — B20 Human immunodeficiency virus [HIV] disease: Secondary | ICD-10-CM

## 2014-04-29 MED ORDER — DOLUTEGRAVIR SODIUM 50 MG PO TABS: 50.0000 mg | ORAL_TABLET | Freq: Every day | ORAL | Status: DC

## 2014-04-29 MED ORDER — DARUNAVIR ETHANOLATE 100 MG/ML PO SUSP: 800.0000 mg | Freq: Every day | ORAL | Status: DC

## 2014-04-29 MED ORDER — RITONAVIR 100 MG PO TABS: 100.0000 mg | ORAL_TABLET | Freq: Every day | ORAL | Status: DC

## 2014-04-29 MED ORDER — ZIDOVUDINE 10 MG/ML PO SYRP
300.0000 mg | ORAL_SOLUTION | Freq: Two times a day (BID) | ORAL | Status: DC
Start: 2014-04-29 — End: 2014-07-21

## 2014-04-29 MED ORDER — SULFAMETHOXAZOLE-TRIMETHOPRIM 200-40 MG/5ML PO SUSP: ORAL | Status: DC

## 2014-04-29 NOTE — Progress Notes (Signed)
   Subjective:    Patient ID: Paula Becker, female    DOB: 17-Mar-1975, 39 y.o.   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 Also with recurrent MRSA furuncles, HSV.  Was seen by derm since last visit due to skin rashes. Was given steroid cream. She is unclear as to the dx.  Paula Becker has been coming out to see her. She states she is tired today. Has moved as her rent changed.  Would like to restart her ART. Was previous DTGV/DRVr/AZT. Has taken KLT prior and does not want to restart.   HIV 1 RNA QUANT (copies/mL)  Date Value  10/22/2013 1105*  09/16/2013 488*  06/03/2013 14497*   CD4 T CELL ABS (/uL)  Date Value  05/19/2013 60*  11/07/2012 140*  10/08/2012 110*   Lat PAP ~ 2 months ago (October 2015 nl).   Review of Systems  Constitutional: Negative for appetite change and unexpected weight change.  HENT: Negative for mouth sores.   Gastrointestinal: Negative for diarrhea and constipation.  Genitourinary: Negative for difficulty urinating.       Objective:   Physical Exam  Constitutional: She appears well-developed and well-nourished.  HENT:  Mouth/Throat: No oropharyngeal exudate.  Eyes: EOM are normal. Pupils are equal, round, and reactive to light.  Neck: Neck supple.  Cardiovascular: Normal rate, regular rhythm and normal heart sounds.   Pulmonary/Chest: Effort normal and breath sounds normal.  Abdominal: Soft. Bowel sounds are normal. She exhibits no distension. There is no tenderness.  Lymphadenopathy:    She has no cervical adenopathy.      Assessment & Plan:

## 2014-04-29 NOTE — Assessment & Plan Note (Signed)
Will continue to monitor, states she is eating well and has a " fridge full of food". Wt up 7#

## 2014-04-29 NOTE — Assessment & Plan Note (Signed)
Will start her back on her rx as she asks for.  She does not want to switch.  hopefully Ambre can help her get her to take rx.  Will see her back in 6 weeks.

## 2014-05-01 ENCOUNTER — Telehealth: Payer: Self-pay | Admitting: *Deleted

## 2014-05-06 ENCOUNTER — Telehealth: Payer: Self-pay | Admitting: *Deleted

## 2014-05-11 ENCOUNTER — Encounter: Payer: Self-pay | Admitting: *Deleted

## 2014-05-11 NOTE — Progress Notes (Signed)
Home visit conducted today from 11:30 to 12:35pm Pt was noted to be uncomfortable related to her "outbreaks of herpes" Pt stated she feels she needs to see another Dr because the clinic is not helping stop the outbreaks. Explained to the patient that she can definitely seek a different provider but I wanted to caution her that the new provider may explain to her that is she begins to take her HIV medications as ordered. Explained to the patient that since her HIV is untreated it leaves her body vulnerable to other viruses and bacteria. Instructed the patient that if we get her HIV undercontrol by taking her medications we can have a better chance of managing her outbreaks. Pt verbalized understanding stating she would like to start taking her medications today since she is now settled in her new place. Pt wanted another list of her medications. Emailed the patient a copy of her medication list so she has the information on hand. Pt stated she knows she needs to start taking her medication. Questioned the pt on if she is ready. Educated the patient that a lot of times we know we need to do something but it is a matter of being ready to make and commit to the change. Pt verbalized a understanding and stated she is ready to change. RN contacted [facility] pharmacy to ensure the stability of Zidovidine and Prezista liquid when removed from container and placed in syringes. Spoke with Ben(Pharm Tech/[facility]) who stated he has confirmed with Edna/Pharmacisit ([facility]) and the pt's medications do not need to be refrigerated so stability is not a concern. Rn remarked a clean, empty liquid medication bottle at 14ml and instructed the pt that she must take 42ml of Zidovidine twice a day preferable with food on her stomach. Rn prefilled syringes with 36ml of prezista and placed 9 prefilled syringes in the refrigerator for the patient. Reviewed with the patient her entire medication regimen and upcoming appts. Pt verified  a understanding of education provided

## 2014-05-25 ENCOUNTER — Encounter: Payer: Self-pay | Admitting: *Deleted

## 2014-06-04 ENCOUNTER — Telehealth: Payer: Self-pay | Admitting: *Deleted

## 2014-06-05 ENCOUNTER — Telehealth: Payer: Self-pay | Admitting: *Deleted

## 2014-06-05 NOTE — Telephone Encounter (Signed)
COntacted the patient at home number provided by the patient. Pt previously stated at this time her cell number is not in service and to contact her at [phone] until further notice. Today I did not receive a answer

## 2014-06-05 NOTE — Telephone Encounter (Signed)
Contacted the patient to schedule home visit. Pt quickly answered the phone stating "right now I am very busy and can't talk" RN replied stating ok and call was ended. At this time RN is waiting on a return call from the patient to offer service and follow up

## 2014-06-11 ENCOUNTER — Other Ambulatory Visit: Payer: Medicare Other | Admitting: *Deleted

## 2014-06-12 ENCOUNTER — Other Ambulatory Visit: Payer: Medicare Other | Admitting: *Deleted

## 2014-06-12 ENCOUNTER — Telehealth: Payer: Self-pay | Admitting: *Deleted

## 2014-06-24 NOTE — Telephone Encounter (Signed)
Purpose of this communication is to relay that that the set frequency for home visits this week has been changed due to a missed visit. Communication has been made with the patient to ensure needs have been met and to offer a home visit. At this time a return call has not been received before the business week is out. Next week the [facility] Based Health Care Nurse plans to continue contact with the patient to offer any services or attempt to address any needs the patient expresses that is reasonable.  

## 2014-07-06 ENCOUNTER — Other Ambulatory Visit: Payer: Medicare Other

## 2014-07-07 ENCOUNTER — Encounter: Payer: Self-pay | Admitting: *Deleted

## 2014-07-07 NOTE — Progress Notes (Signed)
Patient ID: Paula Becker, female   DOB: 10/28/1975, 39 y.o.   MRN: [id number] The intent of this communication is to inform the Health Care Team that this patient will be discharged from [facility]).  Greater than 3 attempts have been made to re-engage the patient along with a mailed letter to the patient's home without any success. Based on this information the services will be terminated since the patient refuses to honor client responsibilities as set forth in Client Rights and Responsibilities statement.  Moving forward, the [facility] will be willing to reopen the patient to services if and when the patient is ready to discuss medication adherence and HIV disease management. Effective 07/07/2014 patient will be discharged and removed from [facility] active patient listing.    This communication has been routed to the signing physician to communicate that the patient is no longer followed by the [facility] based Health Care Nurse.

## 2014-07-15 ENCOUNTER — Ambulatory Visit (INDEPENDENT_AMBULATORY_CARE_PROVIDER_SITE_OTHER): Payer: Medicare Other | Admitting: Infectious Diseases

## 2014-07-15 ENCOUNTER — Encounter: Payer: Self-pay | Admitting: Infectious Diseases

## 2014-07-15 VITALS — BP 100/59 | HR 106 | Temp 98.8°F | Wt 128.0 lb

## 2014-07-15 DIAGNOSIS — B20 Human immunodeficiency virus [HIV] disease: Secondary | ICD-10-CM

## 2014-07-15 DIAGNOSIS — A6000 Herpesviral infection of urogenital system, unspecified: Secondary | ICD-10-CM

## 2014-07-15 DIAGNOSIS — A609 Anogenital herpesviral infection, unspecified: Secondary | ICD-10-CM

## 2014-07-15 MED ORDER — DOLUTEGRAVIR SODIUM 50 MG PO TABS: 50.0000 mg | ORAL_TABLET | Freq: Every day | ORAL | Status: DC

## 2014-07-15 MED ORDER — DARUNAVIR-COBICISTAT 800-150 MG PO TABS
1.0000 | ORAL_TABLET | Freq: Every day | ORAL | Status: DC
Start: 2014-07-15 — End: 2014-11-30

## 2014-07-15 MED ORDER — ACYCLOVIR 200 MG/5ML PO SUSP: 400.0000 mg | Freq: Three times a day (TID) | ORAL | Status: DC

## 2014-07-15 NOTE — Assessment & Plan Note (Signed)
Will give her trial of valtrex. May need to get her back into derm...Marland KitchenMarland Kitchen

## 2014-07-15 NOTE — Progress Notes (Signed)
   Subjective:    Patient ID: Paula Becker, female    DOB: 05-26-1975, 39 y.o.   MRN: [id number]  HPI 39 yo F with HIV/AIDS, multidrug resistant virus, poor adherence/compliance. Also with recurrent MRSA furuncles, HSV.  Was seen by derm since last visit due to skin rashes.  Paula Becker was coming out to see her prior. Would like to restart her ART. Was previous DTGV/DRVr/AZT  Today states that the sores on her back and her bottom are back and have been draining.  Has been very stressed as well, "people, things... section 8, (money)".   HIV 1 RNA QUANT (copies/mL)  Date Value  10/22/2013 1105*  09/16/2013 488*  06/03/2013 14497*   CD4 T CELL ABS (/uL)  Date Value  05/19/2013 60*  11/07/2012 140*  10/08/2012 110*   Review of Systems  Constitutional: Negative for unexpected weight change.  HENT: Positive for mouth sores.   Skin: Positive for wound.       Objective:   Physical Exam  Constitutional: She appears well-developed and well-nourished.  HENT:  Mouth/Throat: No oropharyngeal exudate.  Eyes: EOM are normal. Pupils are equal, round, and reactive to light.  Neck: Neck supple.  Cardiovascular: Normal rate, regular rhythm and normal heart sounds.   Pulmonary/Chest: Effort normal and breath sounds normal.  Abdominal: Soft. Bowel sounds are normal. There is no tenderness.  Lymphadenopathy:    She has no cervical adenopathy.  Skin:          Assessment & Plan:

## 2014-07-15 NOTE — Assessment & Plan Note (Addendum)
Will change her to prezcobix and DTGV.  Offered/refused condoms.  Will defer checking labs today as she has been off meds.  Will try to get her back in with Ambre.  rtc in 2 months.

## 2014-07-20 ENCOUNTER — Ambulatory Visit: Payer: Medicare Other | Admitting: Infectious Diseases

## 2014-07-21 ENCOUNTER — Other Ambulatory Visit: Payer: Self-pay | Admitting: Pharmacist

## 2014-08-04 NOTE — Progress Notes (Signed)
During today's visit the focus was on medication adherence. RN reevaluated the patient's goals. Pt states she wants to take her medications but right now she has so much going on. RN instructed the patient that I would encourage her to address her need to take her medications. Instructed the patient that she will notice her health getting worse if she does not take her medications. Pt verbalized understanding of education provided

## 2014-08-04 NOTE — Progress Notes (Signed)
Patient ID: Paula Becker, female   DOB: July 26, 1975, 39 y.o.   MRN: [id number] This visit is opened in error. Please disregard

## 2014-09-30 ENCOUNTER — Ambulatory Visit (INDEPENDENT_AMBULATORY_CARE_PROVIDER_SITE_OTHER): Payer: Medicare Other | Admitting: Infectious Diseases

## 2014-09-30 ENCOUNTER — Encounter: Payer: Self-pay | Admitting: Infectious Diseases

## 2014-09-30 VITALS — BP 128/87 | HR 78 | Temp 98.0°F | Ht 66.0 in | Wt 128.0 lb

## 2014-09-30 DIAGNOSIS — Z113 Encounter for screening for infections with a predominantly sexual mode of transmission: Secondary | ICD-10-CM

## 2014-09-30 DIAGNOSIS — Z23 Encounter for immunization: Secondary | ICD-10-CM

## 2014-09-30 DIAGNOSIS — R300 Dysuria: Secondary | ICD-10-CM | POA: Diagnosis not present

## 2014-09-30 DIAGNOSIS — B20 Human immunodeficiency virus [HIV] disease: Secondary | ICD-10-CM

## 2014-09-30 DIAGNOSIS — A609 Anogenital herpesviral infection, unspecified: Secondary | ICD-10-CM | POA: Diagnosis not present

## 2014-09-30 DIAGNOSIS — Z139 Encounter for screening, unspecified: Secondary | ICD-10-CM | POA: Diagnosis not present

## 2014-09-30 DIAGNOSIS — Z0189 Encounter for other specified special examinations: Secondary | ICD-10-CM | POA: Diagnosis not present

## 2014-09-30 DIAGNOSIS — Z0283 Encounter for blood-alcohol and blood-drug test: Secondary | ICD-10-CM

## 2014-09-30 LAB — COMPREHENSIVE METABOLIC PANEL
ALT: 5 U/L — ABNORMAL LOW (ref 6–29)
AST: 12 U/L (ref 10–30)
Albumin: 3.7 g/dL (ref 3.6–5.1)
Alkaline Phosphatase: 74 U/L (ref 33–115)
BUN: 23 mg/dL (ref 7–25)
CHLORIDE: 102 mmol/L (ref 98–110)
CO2: 26 mmol/L (ref 20–31)
Calcium: 9.6 mg/dL (ref 8.6–10.2)
Creat: 1.66 mg/dL — ABNORMAL HIGH (ref 0.50–1.10)
GLUCOSE: 83 mg/dL (ref 65–99)
POTASSIUM: 4.8 mmol/L (ref 3.5–5.3)
Sodium: 138 mmol/L (ref 135–146)
Total Bilirubin: 0.3 mg/dL (ref 0.2–1.2)
Total Protein: 9.1 g/dL — ABNORMAL HIGH (ref 6.1–8.1)

## 2014-09-30 LAB — CBC
HCT: 28.9 % — ABNORMAL LOW (ref 36.0–46.0)
Hemoglobin: 9.1 g/dL — ABNORMAL LOW (ref 12.0–15.0)
MCH: 24.6 pg — AB (ref 26.0–34.0)
MCHC: 31.5 g/dL (ref 30.0–36.0)
MCV: 78.1 fL (ref 78.0–100.0)
MPV: 10.1 fL (ref 8.6–12.4)
PLATELETS: 305 10*3/uL (ref 150–400)
RBC: 3.7 MIL/uL — ABNORMAL LOW (ref 3.87–5.11)
RDW: 16 % — AB (ref 11.5–15.5)
WBC: 5 10*3/uL (ref 4.0–10.5)

## 2014-09-30 NOTE — Progress Notes (Signed)
   Subjective:    Patient ID: Paula Becker, female    DOB: 1975-10-29, 39 y.o.   MRN: [id number]  HPI 39 yo F with HIV/AIDS, multidrug resistant virus, poor adherence/compliance. Also with recurrent MRSA furuncles, HSV.  Was seen by derm since last visit due to skin rashes.  [name] was coming out to see her prior. Would seen earlier this summer and started on DTGV/DRVc. Was previous DTGV/DRVr/AZT.  HIV 1 RNA QUANT (copies/mL)  Date Value  10/22/2013 1105*  09/16/2013 488*  06/03/2013 14497*   CD4 T CELL ABS (/uL)  Date Value  05/19/2013 60*  11/07/2012 140*  10/08/2012 110*   She has been staying with her dad and step-mom. Has been stressed due to this, needs her own place. States she needs refills of her meds.  She still has pain on her posterior where her sores were, they are improved though.   Review of Systems  Constitutional: Negative for appetite change and unexpected weight change.  Gastrointestinal: Negative for diarrhea and constipation.  Genitourinary: Negative for difficulty urinating.  Skin: Positive for rash.  Will have PAP in [place].  12 point ROS o/w negative.      Objective:   Physical Exam  Constitutional: She appears well-developed and well-nourished.  HENT:  Mouth/Throat: No oropharyngeal exudate.  Eyes: EOM are normal. Pupils are equal, round, and reactive to light.  Neck: Neck supple.  Cardiovascular: Normal rate, regular rhythm and normal heart sounds.   Pulmonary/Chest: Effort normal and breath sounds normal.  Abdominal: Soft. Bowel sounds are normal. There is no tenderness. There is no rebound.  Lymphadenopathy:    She has no cervical adenopathy.  Skin:         Assessment & Plan:

## 2014-09-30 NOTE — Assessment & Plan Note (Signed)
She needs housing Needs home nurse.  Will check her labs today.  Offered/refused condoms.  Is meeting with Leveda Anna.  Will see her back in 2-3 months.

## 2014-09-30 NOTE — Assessment & Plan Note (Signed)
Appears to be improving.  Repeat dose of ACV prn.

## 2014-09-30 NOTE — Addendum Note (Signed)
Addended by: Landis Gandy on: 09/30/2014 12:45 PM   Modules accepted: Orders

## 2014-10-01 ENCOUNTER — Telehealth: Payer: Self-pay | Admitting: *Deleted

## 2014-10-01 ENCOUNTER — Encounter: Payer: Self-pay | Admitting: *Deleted

## 2014-10-01 DIAGNOSIS — F1910 Other psychoactive substance abuse, uncomplicated: Secondary | ICD-10-CM

## 2014-10-01 LAB — T-HELPER CELL (CD4) - (RCID CLINIC ONLY)
CD4 % Helper T Cell: 6 % — ABNORMAL LOW (ref 33–55)
CD4 T Cell Abs: 60 /uL — ABNORMAL LOW (ref 400–2700)

## 2014-10-01 LAB — HIV-1 RNA ULTRAQUANT REFLEX TO GENTYP+
HIV 1 RNA Quant: 103 copies/mL — ABNORMAL HIGH (ref <20)
HIV-1 RNA Quant, Log: 2.01 {Log} — ABNORMAL HIGH (ref <1.30)

## 2014-10-01 LAB — RPR

## 2014-10-01 NOTE — Telephone Encounter (Signed)
Referring to [facility] per Dr. Johnnye Sima.  Sending last 8 office notes, 2 years worth of labs, any imaging, insurance and demographics per their request.  Fax to [phone] Phone: [phone]. Landis Gandy, RN

## 2014-10-01 NOTE — BH Specialist Note (Signed)
Counselor was asked to speak with client and complete a warm handoff as client' blood pressure was very high.  Counselor spoke with client and inquired with her as to why she felt her blood pressure was so high.  Client communicated that she stopped taking her hypertension medication because of being slack and not having the necessary funding/monies to pay for it. Client was oriented times four with good affect and dress.  However, client sometimes said things that most individuals would find odd or strange.  Client was a bit self centered and blamed her dad's live in spouse/girlfriend of being difficult to live with.  Counselor gently reminded client that she is living in her home that she might not be able to be picky about the actions of the owner of the home in which she is currently residing with.  Client shared that she was living with her dad and his partner whom she did not like and did not get along with.  Client said that the woman is "wicked".  Counselor addressed the issue that client was in the position she was in because she let the opportunity of gaining housing slip through her hands with her section 8. Counselor recommended after client admitted poly substance abuse to meet regularly.  Client said she would look at her busy schedule and get back with me.  Counselor does not feel client is in the stage of change at this time and feels she is in denial.  Client said she would call later and set up an appointment to continue meeting with counselor.   Rolena Infante, LPCA, MA [facility]

## 2014-10-01 NOTE — Telephone Encounter (Signed)
-----   Message from Campbell Riches, MD sent at 10/01/2014 12:50 PM EDT ----- Pt needs renal eval  ----- Message -----    From: Lab in [facility] Interface    Sent: 09/30/2014   9:33 PM      To: Campbell Riches, MD

## 2014-10-02 ENCOUNTER — Telehealth: Payer: Self-pay | Admitting: *Deleted

## 2014-10-02 NOTE — Telephone Encounter (Signed)
-----   Message from Campbell Riches, MD sent at 10/01/2014 12:50 PM EDT ----- Pt needs renal eval  ----- Message -----    From: Lab in [facility] Interface    Sent: 09/30/2014   9:33 PM      To: Campbell Riches, MD

## 2014-10-02 NOTE — Telephone Encounter (Signed)
Referral and all required notes faxed to [facility] for MD review. Fax # [phone].  Requested that a note be faxed back when patient's appointment is scheduled. Myrtis Hopping CMA

## 2014-10-08 ENCOUNTER — Telehealth: Payer: Self-pay | Admitting: *Deleted

## 2014-10-08 DIAGNOSIS — A6000 Herpesviral infection of urogenital system, unspecified: Secondary | ICD-10-CM

## 2014-10-08 DIAGNOSIS — A609 Anogenital herpesviral infection, unspecified: Secondary | ICD-10-CM

## 2014-10-08 MED ORDER — ACYCLOVIR 5 % EX OINT: 1.0000 "application " | TOPICAL_OINTMENT | CUTANEOUS | Status: DC

## 2014-10-08 MED ORDER — ACYCLOVIR 200 MG/5ML PO SUSP: 400.0000 mg | Freq: Three times a day (TID) | ORAL | Status: DC

## 2014-10-08 NOTE — Telephone Encounter (Signed)
herpes outbreak, itching, requesting refills.

## 2014-10-12 ENCOUNTER — Encounter (HOSPITAL_COMMUNITY): Payer: Self-pay | Admitting: Emergency Medicine

## 2014-10-12 ENCOUNTER — Emergency Department (HOSPITAL_COMMUNITY)
Admission: EM | Admit: 2014-10-12 | Discharge: 2014-10-12 | Disposition: A | Discharge status: Home / Self Care | Payer: Medicare Other | Attending: Emergency Medicine | Admitting: Emergency Medicine

## 2014-10-12 DIAGNOSIS — B20 Human immunodeficiency virus [HIV] disease: Secondary | ICD-10-CM

## 2014-10-12 DIAGNOSIS — Z862 Personal history of diseases of the blood and blood-forming organs and certain disorders involving the immune mechanism: Secondary | ICD-10-CM | POA: Insufficient documentation

## 2014-10-12 DIAGNOSIS — Z8701 Personal history of pneumonia (recurrent): Secondary | ICD-10-CM | POA: Diagnosis not present

## 2014-10-12 DIAGNOSIS — Z9119 Patient's noncompliance with other medical treatment and regimen: Secondary | ICD-10-CM | POA: Diagnosis not present

## 2014-10-12 DIAGNOSIS — A6009 Herpesviral infection of other urogenital tract: Secondary | ICD-10-CM | POA: Diagnosis not present

## 2014-10-12 DIAGNOSIS — Z8614 Personal history of Methicillin resistant Staphylococcus aureus infection: Secondary | ICD-10-CM | POA: Diagnosis not present

## 2014-10-12 DIAGNOSIS — Z21 Asymptomatic human immunodeficiency virus [HIV] infection status: Secondary | ICD-10-CM | POA: Diagnosis not present

## 2014-10-12 DIAGNOSIS — L989 Disorder of the skin and subcutaneous tissue, unspecified: Secondary | ICD-10-CM | POA: Insufficient documentation

## 2014-10-12 DIAGNOSIS — Z79899 Other long term (current) drug therapy: Secondary | ICD-10-CM | POA: Diagnosis not present

## 2014-10-12 DIAGNOSIS — N898 Other specified noninflammatory disorders of vagina: Secondary | ICD-10-CM | POA: Diagnosis present

## 2014-10-12 DIAGNOSIS — A609 Anogenital herpesviral infection, unspecified: Secondary | ICD-10-CM

## 2014-10-12 MED ORDER — IBUPROFEN 800 MG PO TABS: 800.0000 mg | ORAL_TABLET | Freq: Three times a day (TID) | ORAL | Status: DC

## 2014-10-12 MED ORDER — HYDROCODONE-ACETAMINOPHEN 5-325 MG PO TABS
2.0000 | ORAL_TABLET | Freq: Once | ORAL | Status: AC
  Administered 2014-10-12: 2 via ORAL
  Filled 2014-10-12: qty 2

## 2014-10-12 NOTE — ED Provider Notes (Signed)
CSN: [id number]     Arrival date & time 10/12/14  1850 History  This chart was scribed for non-physician practitioner, Abigail Butts, PA-C, working with Gareth Morgan, MD, by Helane Gunther ED Scribe. This patient was seen in room TR02C/TR02C and the patient's care was started at 8:10 PM     Chief Complaint  Patient presents with  . SEXUALLY TRANSMITTED DISEASE   The history is provided by the patient and medical records. No language interpreter was used.   HPI Comments: Paula Becker is a 39 y.o. 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 She reports associated vaginal irritation and blistering, as well as blistering on he buttocks and thighs with some bleeding from the lesions. She has been taking Acyclovir PO and applying Acyclovir ointment which were prescribed by Dr Johnnye Sima, whom she saw recently (09/30/14 per record review). She notes they called her with lab results several days ago stating that her kidneys are not functioning properly and have referred her to a nephrologist. She notes her last herpes outbreak was 2 months ago. She states she was first diagnosed with HIV and HSV in 2002. Pt denies fever, , pain, nausea, vomiting and vaginal discharge.  She does have burning with urination but reports it is simply the lesions burning.  Past Medical History  Diagnosis Date  . HIV (human immunodeficiency virus infection)   . MRSA (methicillin resistant staph aureus) culture positive   . Necrotizing pneumonia 07/2010  . Pneumonia 06/23/11    RLL patchy, nodular lung disease  . Anemia of chronic disease   . History of noncompliance with medical treatment    Past Surgical History  Procedure Laterality Date  . Cesarean section  1998  . Breast surgery  03/2010    right; "don't know what they did"  . Video bronchoscopy  06/28/2011     Procedure: VIDEO BRONCHOSCOPY WITH FLUORO;  Surgeon: Chesley Mires, MD;  Location: [facility];  Service: Cardiopulmonary;  Laterality: Bilateral;   Family History  Problem Relation Age of Onset  . Lung disease Mother     passed away at 6 with lung disease  . Hypertension Father    Social History  Substance Use Topics  . Smoking status: Never Smoker   . Smokeless tobacco: Never Used  . Alcohol Use: No   OB History    No data available     Review of Systems  Constitutional: Negative for fever, diaphoresis, appetite change, fatigue and unexpected weight change.  HENT: Negative for mouth sores.   Eyes: Negative for visual disturbance.  Respiratory: Negative for cough, chest tightness, shortness of breath and wheezing.   Cardiovascular: Negative for chest pain.  Gastrointestinal: Negative for nausea, vomiting, abdominal pain, diarrhea and constipation.  Endocrine: Negative for polydipsia, polyphagia and polyuria.  Genitourinary: Positive for dysuria and vaginal pain. Negative for urgency, frequency, hematuria and vaginal discharge.  Musculoskeletal: Negative for back pain and neck stiffness.  Skin: Positive for wound. Negative for rash.  Allergic/Immunologic: Negative for immunocompromised state.  Neurological: Negative for syncope, light-headedness and headaches.  Hematological: Does not bruise/bleed easily.  Psychiatric/Behavioral: Negative for sleep disturbance. The patient is not nervous/anxious.     Allergies  Shellfish-derived products and Vancomycin  Home Medications   Prior to Admission medications   Medication Sig Start Date End Date Taking? Authorizing Provider  acyclovir (ZOVIRAX) 200 MG/5ML suspension Take 10 mLs (  400 mg total) by mouth 3 (three) times daily. 10/08/14   Campbell Riches, MD  acyclovir ointment (ZOVIRAX) 5 % Apply 1 application topically every 3 (three) hours. 10/08/14   Campbell Riches, MD  clobetasol cream (TEMOVATE) 0.05 %  03/30/14   Historical  Provider, MD  darunavir-cobicistat (PREZCOBIX) 800-150 MG per tablet Take 1 tablet by mouth daily. Swallow whole. Do NOT crush, break or chew tablets. Take with food. 07/15/14   Campbell Riches, MD  diphenhydrAMINE (BENADRYL) 25 mg capsule Take 1 capsule (25 mg total) by mouth every 6 (six) hours as needed. Patient not taking: Reported on 07/15/2014 03/20/14   Campbell Riches, MD  dolutegravir (TIVICAY) 50 MG tablet Take 1 tablet (50 mg total) by mouth daily. 07/15/14   Campbell Riches, MD  fluticasone (CUTIVATE) 0.05 % cream  03/30/14   Historical Provider, MD  hydrOXYzine (ATARAX/VISTARIL) 10 MG tablet Take 1 tablet (10 mg total) by mouth 3 (three) times daily as needed. Patient not taking: Reported on 07/15/2014 03/20/14   Campbell Riches, MD  ibuprofen (ADVIL,MOTRIN) 800 MG tablet Take 1 tablet (800 mg total) by mouth 3 (three) times daily. 10/12/14   Shalamar Plourde, PA-C  mirtazapine (REMERON) 15 MG tablet Take 1 tablet (15 mg total) by mouth at bedtime. 10/12/11 11/11/11  Michel Bickers, MD  sulfamethoxazole-trimethoprim (BACTRIM,SEPTRA) 200-40 MG/5ML suspension TAKE 20 ML BY MOUTH ON TUESDAY, [place], AND SATURDAY Patient not taking: Reported on 05/11/2014 04/29/14   Campbell Riches, MD   BP 120/84 mmHg  Pulse 81  Temp(Src) 98 F (36.7 C) (Oral)  Resp 16  SpO2 100%  LMP 10/06/2014 Physical Exam  Constitutional: She is oriented to person, place, and time. She appears well-developed and well-nourished. No distress.  Awake, alert, nontoxic appearance  HENT:  Head: Normocephalic and atraumatic.  Mouth/Throat: Oropharynx is clear and moist. No oropharyngeal exudate.  Eyes: Conjunctivae are normal. No scleral icterus.  Neck: Normal range of motion. Neck supple.  Cardiovascular: Normal rate, regular rhythm, normal heart sounds and intact distal pulses.   No murmur heard. Capillary refill < 3 sec  Pulmonary/Chest: Effort normal and breath sounds normal. No respiratory distress. She  has no wheezes.  Equal chest expansion  Abdominal: Soft. Bowel sounds are normal. She exhibits no mass. There is no tenderness. There is no rebound and no guarding.  Musculoskeletal: Normal range of motion. She exhibits no edema.  Neurological: She is alert and oriented to person, place, and time.  Speech is clear and goal oriented Moves extremities without ataxia  Skin: Skin is warm and dry. Lesion noted. She is not diaphoretic.  Multiple open lesions to the vulva consistent with HSV on top of a condyloma Patient with furuncle noted at the top of the gluteal cleft without  Multiple open and excoriated lesions to the bilateral buttock No erythema, induration noted to any of the lesions to suggest secondary infection.  Psychiatric: She has a normal mood and affect.  Nursing note and vitals reviewed.   ED Course  Procedures  DIAGNOSTIC STUDIES: Oxygen Saturation is 100% on RA, normal by my interpretation.    COORDINATION OF CARE: 8:17 PM - Performed pelvic exam with female chaperone present. Discussed plans to order pain medication. Advised to f/u with her PCP. Pt advised of plan for treatment and pt agrees.  Labs Review Labs Reviewed - No data to display  Imaging Review No results found. I have personally reviewed and evaluated these images and lab results as  part of my medical decision-making.   EKG Interpretation None      MDM   Final diagnoses:  HSV (herpes simplex virus) anogenital infection  Human immunodeficiency virus (HIV) disease  Skin lesion   Anique Wurzer presents with lesions including multiple furnacles, vesicles and condyloma.  Patient with ulcerated lesions on top of and amongst these consistent with HSV. She is using acyclovir oral and topical without relief. She has not tried any pain control prior to arrival. Pain is well controlled here in the emergency department. She is afebrile and not tachycardic. No concern for infection. She is to follow-up with her  infectious disease physician along with her specialized dermatologist. Patient given ibuprofen 800 mg for pain control and conservative therapies discussed.   BP 120/84 mmHg  Pulse 81  Temp(Src) 98 F (36.7 C) (Oral)  Resp 16  SpO2 100%  LMP 10/06/2014  I personally performed the services described in this documentation, which was scribed in my presence. The recorded information has been reviewed and is accurate.  The patient was discussed with and seen by Dr. Billy Fischer who agrees with the treatment plan.   Jarrett Soho Kyah Buesing, PA-C 10/12/14 2340  Gareth Morgan, MD 10/15/14 1242

## 2014-10-12 NOTE — ED Notes (Addendum)
PA at bedside.

## 2014-10-12 NOTE — Discharge Instructions (Signed)
1. Medications: you acyclovir oral medication and cream, usual home medications 2. Treatment: rest, drink plenty of fluids, keep wounds clean 3. Follow Up: Please followup with your primary doctor and dermatologist in 2-3 days for discussion of your diagnoses and further evaluation after today's visit; if you do not have a primary care doctor use the resource guide provided to find one; Please return to the ER for worsening symptoms

## 2014-10-12 NOTE — Telephone Encounter (Signed)
Patient called confused and not sure why a nephrologist called her with an appt. Explained to her why and advised her to go. She has an appt set for 10/23/14.

## 2014-10-12 NOTE — ED Notes (Signed)
Pt. reports genital herpes flare up this week with vaginal blisters/irritation , denies fever or vaginal discharge.

## 2014-10-12 NOTE — ED Notes (Signed)
MD at bedside. 

## 2014-10-14 ENCOUNTER — Telehealth: Payer: Self-pay | Admitting: *Deleted

## 2014-10-14 NOTE — Telephone Encounter (Signed)
RN received a call from [facility] stating she received a call from Paula Becker's sister Paula Becker. [name] stated her sister really needs help finding housing. She stated currently the patient has a car she cannot afford and their father is asking for 200.00 dollars a month. The patient cannot afford this expense. The sister asked if  I could offer any assistance so she left her number will Paula Becker. Paula Becker's number is [phone] plans to contact the sister to see if I can help in anyway

## 2014-10-16 ENCOUNTER — Telehealth: Payer: Self-pay | Admitting: *Deleted

## 2014-10-16 NOTE — Telephone Encounter (Signed)
RN contacted Paula Becker sister as requested. RN had over a 45 minute conversation with the patient's sister(Paula Becker) and what she needs to make her successful. RN allowed the sister to talk and did not give additional information to her about Paula Becker. Paula Becker asked if I would be willing to see Paula Becker again. RN instructed Paula Becker that I would not mind making visit with Paula Becker but first I need to hear some commitment from Paula Becker. Paula Becker stated she will call Paula Becker and discuss it with her and then they will give me a call

## 2014-10-18 LAB — HIV-1 INTEGRASE GENOTYPE

## 2014-10-23 DIAGNOSIS — N183 Chronic kidney disease, stage 3 (moderate): Secondary | ICD-10-CM | POA: Diagnosis not present

## 2014-10-23 DIAGNOSIS — N2581 Secondary hyperparathyroidism of renal origin: Secondary | ICD-10-CM | POA: Diagnosis not present

## 2014-10-23 DIAGNOSIS — N390 Urinary tract infection, site not specified: Secondary | ICD-10-CM | POA: Diagnosis not present

## 2014-10-27 ENCOUNTER — Other Ambulatory Visit: Payer: Self-pay | Admitting: Nephrology

## 2014-10-27 DIAGNOSIS — N183 Chronic kidney disease, stage 3 unspecified: Secondary | ICD-10-CM

## 2014-10-27 DIAGNOSIS — N2581 Secondary hyperparathyroidism of renal origin: Secondary | ICD-10-CM

## 2014-10-29 ENCOUNTER — Ambulatory Visit
Admission: RE | Admit: 2014-10-29 | Discharge: 2014-10-29 | Disposition: A | Discharge status: Home / Self Care | Payer: Medicare Other | Source: Ambulatory Visit | Attending: Nephrology | Admitting: Nephrology

## 2014-10-29 DIAGNOSIS — N183 Chronic kidney disease, stage 3 unspecified: Secondary | ICD-10-CM

## 2014-10-29 DIAGNOSIS — N2581 Secondary hyperparathyroidism of renal origin: Secondary | ICD-10-CM

## 2014-10-29 DIAGNOSIS — N2889 Other specified disorders of kidney and ureter: Secondary | ICD-10-CM | POA: Diagnosis not present

## 2014-11-01 ENCOUNTER — Emergency Department (HOSPITAL_COMMUNITY)
Admission: EM | Admit: 2014-11-01 | Discharge: 2014-11-01 | Disposition: A | Discharge status: Home / Self Care | Payer: Medicare Other | Attending: Emergency Medicine | Admitting: Emergency Medicine

## 2014-11-01 ENCOUNTER — Encounter (HOSPITAL_COMMUNITY): Payer: Self-pay | Admitting: Nurse Practitioner

## 2014-11-01 DIAGNOSIS — A6009 Herpesviral infection of other urogenital tract: Secondary | ICD-10-CM | POA: Insufficient documentation

## 2014-11-01 DIAGNOSIS — Z21 Asymptomatic human immunodeficiency virus [HIV] infection status: Secondary | ICD-10-CM | POA: Diagnosis not present

## 2014-11-01 DIAGNOSIS — Z79899 Other long term (current) drug therapy: Secondary | ICD-10-CM | POA: Diagnosis not present

## 2014-11-01 DIAGNOSIS — N760 Acute vaginitis: Secondary | ICD-10-CM | POA: Insufficient documentation

## 2014-11-01 DIAGNOSIS — Z8614 Personal history of Methicillin resistant Staphylococcus aureus infection: Secondary | ICD-10-CM | POA: Insufficient documentation

## 2014-11-01 DIAGNOSIS — Z9119 Patient's noncompliance with other medical treatment and regimen: Secondary | ICD-10-CM | POA: Insufficient documentation

## 2014-11-01 DIAGNOSIS — Z791 Long term (current) use of non-steroidal anti-inflammatories (NSAID): Secondary | ICD-10-CM | POA: Diagnosis not present

## 2014-11-01 DIAGNOSIS — R102 Pelvic and perineal pain: Secondary | ICD-10-CM | POA: Diagnosis present

## 2014-11-01 DIAGNOSIS — Z8701 Personal history of pneumonia (recurrent): Secondary | ICD-10-CM | POA: Diagnosis not present

## 2014-11-01 DIAGNOSIS — Z862 Personal history of diseases of the blood and blood-forming organs and certain disorders involving the immune mechanism: Secondary | ICD-10-CM | POA: Diagnosis not present

## 2014-11-01 DIAGNOSIS — A6000 Herpesviral infection of urogenital system, unspecified: Secondary | ICD-10-CM

## 2014-11-01 DIAGNOSIS — B9689 Other specified bacterial agents as the cause of diseases classified elsewhere: Secondary | ICD-10-CM

## 2014-11-01 HISTORY — DX: Herpesviral infection, unspecified: B00.9

## 2014-11-01 LAB — WET PREP, GENITAL
TRICH WET PREP: NONE SEEN
YEAST WET PREP: NONE SEEN

## 2014-11-01 MED ORDER — ACYCLOVIR 400 MG PO TABS: 400.0000 mg | ORAL_TABLET | Freq: Three times a day (TID) | ORAL | Status: DC

## 2014-11-01 MED ORDER — METRONIDAZOLE 500 MG PO TABS: 500.0000 mg | ORAL_TABLET | Freq: Two times a day (BID) | ORAL | Status: DC

## 2014-11-01 NOTE — ED Notes (Signed)
She states she is having vaginal pain and foul smell from vagina since yesterday, she is concerned for herpes flare up. She has HIV and herpes and states she takes her daily meds as prescribed.

## 2014-11-01 NOTE — ED Provider Notes (Signed)
CSN: [id number]     Arrival date & time 11/01/14  1508 History  By signing my name below, I, Irene Pap, attest that this documentation has been prepared under the direction and in the presence of Klyde Banka, PA-C. Electronically Signed: Irene Pap, ED Scribe. 11/01/2014. 5:31 PM.  Chief Complaint  Patient presents with  . Vaginal Pain   The history is provided by the patient. No language interpreter was used.    HPI Comments: Paula Becker is a 39 y.o. female with a hx of HIV and herpes who presents to the Emergency Department complaining of vaginal pain and vaginal odor onset 3 days ago. Pt states that this pain feels similar to her past herpes flare ups. Pt states that she has taken acyclovir for relief in the past, but does not currently have any. Pt states that she has been taking her daily HIV medications and checks up regularly with her doctor. Pt also reports bilateral knee pain but has not tried anything for the pain. Denies vaginal discharge or fever. Reports that she has regular periods and states that her LMP just finished.   Past Medical History  Diagnosis Date  . HIV (human immunodeficiency virus infection) ([place])   . MRSA (methicillin resistant staph aureus) culture positive   . Necrotizing pneumonia ([place]) 07/2010  . Pneumonia 06/23/11    RLL patchy, nodular lung disease  . Anemia of chronic disease   . History of noncompliance with medical treatment   . Herpes    Past Surgical History  Procedure Laterality Date  . Cesarean section  1998  . Breast surgery  03/2010    right; "don't know what they did"  . Video bronchoscopy  06/28/2011    Procedure: VIDEO BRONCHOSCOPY WITH FLUORO;  Surgeon: Chesley Mires, MD;  Location: [facility];  Service: Cardiopulmonary;  Laterality: Bilateral;   Family History  Problem Relation Age of Onset  . Lung disease Mother     passed away at 58 with lung disease  . Hypertension Father    Social History  Substance Use Topics   . Smoking status: Never Smoker   . Smokeless tobacco: Never Used  . Alcohol Use: No   OB History    No data available     Review of Systems  Constitutional: Negative for fever.  Genitourinary: Positive for vaginal pain. Negative for vaginal discharge.       Vaginal odor  All other systems reviewed and are negative.  Allergies  Shellfish-derived products and Vancomycin  Home Medications   Prior to Admission medications   Medication Sig Start Date End Date Taking? Authorizing Provider  acyclovir (ZOVIRAX) 200 MG/5ML suspension Take 10 mLs (400 mg total) by mouth 3 (three) times daily. 10/08/14   Campbell Riches, MD  acyclovir ointment (ZOVIRAX) 5 % Apply 1 application topically every 3 (three) hours. 10/08/14   Campbell Riches, MD  clobetasol cream (TEMOVATE) 0.05 %  03/30/14   Historical Provider, MD  darunavir-cobicistat (PREZCOBIX) 800-150 MG per tablet Take 1 tablet by mouth daily. Swallow whole. Do NOT crush, break or chew tablets. Take with food. 07/15/14   Campbell Riches, MD  diphenhydrAMINE (BENADRYL) 25 mg capsule Take 1 capsule (25 mg total) by mouth every 6 (six) hours as needed. Patient not taking: Reported on 07/15/2014 03/20/14   Campbell Riches, MD  dolutegravir (TIVICAY) 50 MG tablet Take 1 tablet (50 mg total) by mouth daily. 07/15/14   Campbell Riches, MD  fluticasone (CUTIVATE) 0.05 %  cream  03/30/14   Historical Provider, MD  hydrOXYzine (ATARAX/VISTARIL) 10 MG tablet Take 1 tablet (10 mg total) by mouth 3 (three) times daily as needed. Patient not taking: Reported on 07/15/2014 03/20/14   Campbell Riches, MD  ibuprofen (ADVIL,MOTRIN) 800 MG tablet Take 1 tablet (800 mg total) by mouth 3 (three) times daily. 10/12/14   Hannah Muthersbaugh, PA-C  mirtazapine (REMERON) 15 MG tablet Take 1 tablet (15 mg total) by mouth at bedtime. 10/12/11 11/11/11  Michel Bickers, MD  sulfamethoxazole-trimethoprim (BACTRIM,SEPTRA) 200-40 MG/5ML suspension TAKE 20 ML BY MOUTH ON  TUESDAY, THURSDAY, AND SATURDAY Patient not taking: Reported on 05/11/2014 04/29/14   Campbell Riches, MD   BP 112/79 mmHg  Pulse 66  Temp(Src) 98.6 F (37 C) (Oral)  Resp 18  Wt 132 lb (59.875 kg)  SpO2 100%  LMP 10/25/2014  Physical Exam  Constitutional: She is oriented to person, place, and time. She appears well-developed and well-nourished.  HENT:  Head: Normocephalic and atraumatic.  Eyes: EOM are normal.  Neck: Normal range of motion. Neck supple.  Cardiovascular: Normal rate, regular rhythm and normal heart sounds.   Pulmonary/Chest: Effort normal and breath sounds normal. No respiratory distress. She has no wheezes. She has no rales.  Genitourinary:  Multiple ulcerations to the genitalia, tender to touch. White thin vaginal discharge in vaginal canal. Cervix is normal in appearance. Multiple small superficial pustules around buttock area. No drainage. No surrounding cellulitis.  Musculoskeletal: Normal range of motion.  Neurological: She is alert and oriented to person, place, and time.  Skin: Skin is warm and dry.  Psychiatric: She has a normal mood and affect. Her behavior is normal.  Nursing note and vitals reviewed.   ED Course  Procedures (including critical care time) DIAGNOSTIC STUDIES: Oxygen Saturation is 100% on RA, normal by my interpretation.    COORDINATION OF CARE: 5:29 PM-Discussed treatment plan which includes pelvic exam with pt at bedside and pt agreed to plan.   Labs Review Labs Reviewed  WET PREP, GENITAL - Abnormal; Notable for the following:    Clue Cells Wet Prep HPF POC TOO NUMEROUS TO COUNT (*)    WBC, Wet Prep HPF POC FEW (*)    All other components within normal limits  GC/CHLAMYDIA PROBE AMP ([place]) NOT AT [facility]    Imaging Review No results found.    EKG Interpretation None      MDM   Final diagnoses:  Herpes genitalis  BV (bacterial vaginosis)    patient emergency department complaining of genital lesions. History  of herpes. Also HIV positive, currently compliant with her medications. Exam is consistent with active herpes outbreak, also wet prep showing many clue cells consistent with BV. We'll start on a cyclic beer. Also will treat with Flagyl. Patient is otherwise nontoxic appearing. Afebrile. Normal vital signs. Instructed to follow-up with her primary care doctor.  Filed Vitals:   11/01/14 1907  BP: 109/71  Pulse: 84  Temp: 98 F (36.7 C)  Resp: 14   I personally performed the services described in this documentation, which was scribed in my presence. The recorded information has been reviewed and is accurate.   Jeannett Senior, PA-C 11/01/14 2127  Forde Dandy, MD 11/02/14 [phone]

## 2014-11-01 NOTE — Discharge Instructions (Signed)
Take both acyclovir and flagyl as prescribed until all gone. Follow up with your doctor.   Bacterial Vaginosis Bacterial vaginosis is a vaginal infection that occurs when the normal balance of bacteria in the vagina is disrupted. It results from an overgrowth of certain bacteria. This is the most common vaginal infection in women of childbearing age. Treatment is important to prevent complications, especially in pregnant women, as it can cause a premature delivery. CAUSES  Bacterial vaginosis is caused by an increase in harmful bacteria that are normally present in smaller amounts in the vagina. Several different kinds of bacteria can cause bacterial vaginosis. However, the reason that the condition develops is not fully understood. RISK FACTORS Certain activities or behaviors can put you at an increased risk of developing bacterial vaginosis, including:  Having a new sex partner or multiple sex partners.  Douching.  Using an intrauterine device (IUD) for contraception. Women do not get bacterial vaginosis from toilet seats, bedding, swimming pools, or contact with objects around them. SIGNS AND SYMPTOMS  Some women with bacterial vaginosis have no signs or symptoms. Common symptoms include:  Grey vaginal discharge.  A fishlike odor with discharge, especially after sexual intercourse.  Itching or burning of the vagina and vulva.  Burning or pain with urination. DIAGNOSIS  Your health care provider will take a medical history and examine the vagina for signs of bacterial vaginosis. A sample of vaginal fluid may be taken. Your health care provider will look at this sample under a microscope to check for bacteria and abnormal cells. A vaginal pH test may also be done.  TREATMENT  Bacterial vaginosis may be treated with antibiotic medicines. These may be given in the form of a pill or a vaginal cream. A second round of antibiotics may be prescribed if the condition comes back after treatment.   HOME CARE INSTRUCTIONS   Only take over-the-counter or prescription medicines as directed by your health care provider.  If antibiotic medicine was prescribed, take it as directed. Make sure you finish it even if you start to feel better.  Do not have sex until treatment is completed.  Tell all sexual partners that you have a vaginal infection. They should see their health care provider and be treated if they have problems, such as a mild rash or itching.  Practice safe sex by using condoms and only having one sex partner. SEEK MEDICAL CARE IF:   Your symptoms are not improving after 3 days of treatment.  You have increased discharge or pain.  You have a fever. MAKE SURE YOU:   Understand these instructions.  Will watch your condition.  Will get help right away if you are not doing well or get worse. FOR MORE INFORMATION  Centers for Disease Control and Prevention, Division of STD Prevention: Appraiser[employer]raud.fi American Sexual Health Association (ASHA): www.ashastd.org  Document Released: 01/16/2005 Document Revised: 11/06/2012 Document Reviewed: 08/28/2012 [facility] Patient Information 2015 [place], [place]. This information is not intended to replace advice given to you by your health care provider. Make sure you discuss any questions you have with your health care provider.   Genital Herpes Genital herpes is a sexually transmitted disease. This means that it is a disease passed by having sex with an infected person. There is no cure for genital herpes. The time between attacks can be months to years. The virus may live in a person but produce no problems (symptoms). This infection can be passed to a baby as it travels down the birth  canal (vagina). In a newborn, this can cause central nervous system damage, eye damage, or even death. The virus that causes genital herpes is usually HSV-2 virus. The virus that causes oral herpes is usually HSV-1. The diagnosis (learning what is wrong)  is made through culture results. SYMPTOMS  Usually symptoms of pain and itching begin a few days to a week after contact. It first appears as small blisters that progress to small painful ulcers which then scab over and heal after several days. It affects the outer genitalia, birth canal, cervix, penis, anal area, buttocks, and thighs. HOME CARE INSTRUCTIONS   Keep ulcerated areas dry and clean.  Take medications as directed. Antiviral medications can speed up healing. They will not prevent recurrences or cure this infection. These medications can also be taken for suppression if there are frequent recurrences.  While the infection is active, it is contagious. Avoid all sexual contact during active infections.  Condoms may help prevent spread of the herpes virus.  Practice safe sex.  Wash your hands thoroughly after touching the genital area.  Avoid touching your eyes after touching your genital area.  Inform your caregiver if you have had genital herpes and become pregnant. It is your responsibility to insure a safe outcome for your baby in this pregnancy.  Only take over-the-counter or prescription medicines for pain, discomfort, or fever as directed by your caregiver. SEEK MEDICAL CARE IF:   You have a recurrence of this infection.  You do not respond to medications and are not improving.  You have new sources of pain or discharge which have changed from the original infection.  You have an oral temperature above 102 F (38.9 C).  You develop abdominal pain.  You develop eye pain or signs of eye infection. Document Released: 01/14/2000 Document Revised: 04/10/2011 Document Reviewed: 02/03/2009 [facility] Patient Information 2015 [place], [place]. This information is not intended to replace advice given to you by your health care provider. Make sure you discuss any questions you have with your health care provider.

## 2014-11-01 NOTE — ED Notes (Signed)
The pt has already been seen  And tests done   She was seen by the pa before myself.  Alert  No distress.  Sleepy  And relaxed.

## 2014-11-02 LAB — GC/CHLAMYDIA PROBE AMP (~~LOC~~) NOT AT ARMC
Chlamydia: NEGATIVE
Neisseria Gonorrhea: NEGATIVE

## 2014-11-04 DIAGNOSIS — L259 Unspecified contact dermatitis, unspecified cause: Secondary | ICD-10-CM | POA: Diagnosis not present

## 2014-11-10 DIAGNOSIS — D631 Anemia in chronic kidney disease: Secondary | ICD-10-CM | POA: Diagnosis not present

## 2014-11-13 ENCOUNTER — Other Ambulatory Visit (HOSPITAL_COMMUNITY)
Admission: RE | Admit: 2014-11-13 | Discharge: 2014-11-13 | Disposition: A | Discharge status: Home / Self Care | Payer: Medicare Other | Source: Ambulatory Visit | Attending: Infectious Diseases | Admitting: Infectious Diseases

## 2014-11-13 ENCOUNTER — Ambulatory Visit (INDEPENDENT_AMBULATORY_CARE_PROVIDER_SITE_OTHER): Payer: Medicare Other | Admitting: *Deleted

## 2014-11-13 DIAGNOSIS — R87612 Low grade squamous intraepithelial lesion on cytologic smear of cervix (LGSIL): Secondary | ICD-10-CM

## 2014-11-13 DIAGNOSIS — Z124 Encounter for screening for malignant neoplasm of cervix: Secondary | ICD-10-CM | POA: Diagnosis not present

## 2014-11-13 DIAGNOSIS — Z1231 Encounter for screening mammogram for malignant neoplasm of breast: Secondary | ICD-10-CM

## 2014-11-13 DIAGNOSIS — Z01411 Encounter for gynecological examination (general) (routine) with abnormal findings: Secondary | ICD-10-CM | POA: Insufficient documentation

## 2014-11-13 NOTE — Patient Instructions (Signed)
Pt advised to take HIV medications as prescribed.  Pt advised that controlling her HIV virus will decrease the occurrence of genital herpes outbreaks.  PAP smear results will be ready in about a week.  I will mail them to you.  Thank you for coming to the Center for your care.  Langley Gauss, RN

## 2014-11-13 NOTE — Progress Notes (Signed)
  Subjective:     Paula Becker is a 39 y.o. woman who comes in today for a  pap smear only.  Previous abnormal Pap smears: no. Contraception: condoms.  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    Objective:  Healed lesions on labia and bilateral inner thighs.  LMP 10/25/2014 Pelvic Exam:. Pap smear obtained.   Assessment:    Screening pap smear.   Plan:  Pt advised to take HIV medications as prescribed.  Pt advised that controlling her HIV virus will decrease the occurrence of genital herpes outbreaks.    Follow up in one year, or as indicated by Pap results.   Pt given educational materials re: HIV and women, self-esteem, BSE, nutrition and diet management, PAP smears and partner safety.  Pt given condoms.

## 2014-11-16 LAB — CYTOLOGY - PAP

## 2014-11-19 NOTE — Addendum Note (Signed)
Addended by: Lorne Skeens D on: 11/19/2014 02:00 PM   Modules accepted: Orders

## 2014-11-19 NOTE — Progress Notes (Addendum)
Abnormal PAP smear results - CIN-1/HPV.  Will make referral to [facility].  They accept the patieint's Medicare.  WOC appt - Wed., Nov 30 @ 12:45pm

## 2014-11-20 ENCOUNTER — Telehealth: Payer: Self-pay | Admitting: *Deleted

## 2014-11-20 NOTE — Telephone Encounter (Signed)
Notified the patient that she was being referred to [facility] for ABNL PAP smear.  RN explained that [facility] would call her to schedule the appointment for the procedure.  Pt verbalized understanding.

## 2014-11-23 ENCOUNTER — Encounter (HOSPITAL_COMMUNITY): Payer: Medicare Other

## 2014-11-27 ENCOUNTER — Encounter: Payer: Self-pay | Admitting: Obstetrics & Gynecology

## 2014-11-27 ENCOUNTER — Emergency Department (HOSPITAL_COMMUNITY)
Admission: EM | Admit: 2014-11-27 | Discharge: 2014-11-27 | Disposition: A | Discharge status: Home / Self Care | Payer: Medicare Other | Attending: Emergency Medicine | Admitting: Emergency Medicine

## 2014-11-27 ENCOUNTER — Encounter (HOSPITAL_COMMUNITY): Payer: Self-pay

## 2014-11-27 DIAGNOSIS — Z8614 Personal history of Methicillin resistant Staphylococcus aureus infection: Secondary | ICD-10-CM | POA: Insufficient documentation

## 2014-11-27 DIAGNOSIS — R6883 Chills (without fever): Secondary | ICD-10-CM | POA: Diagnosis not present

## 2014-11-27 DIAGNOSIS — Z791 Long term (current) use of non-steroidal anti-inflammatories (NSAID): Secondary | ICD-10-CM | POA: Insufficient documentation

## 2014-11-27 DIAGNOSIS — Z862 Personal history of diseases of the blood and blood-forming organs and certain disorders involving the immune mechanism: Secondary | ICD-10-CM | POA: Insufficient documentation

## 2014-11-27 DIAGNOSIS — Z9119 Patient's noncompliance with other medical treatment and regimen: Secondary | ICD-10-CM | POA: Diagnosis not present

## 2014-11-27 DIAGNOSIS — Z7951 Long term (current) use of inhaled steroids: Secondary | ICD-10-CM | POA: Insufficient documentation

## 2014-11-27 DIAGNOSIS — Z8701 Personal history of pneumonia (recurrent): Secondary | ICD-10-CM | POA: Diagnosis not present

## 2014-11-27 DIAGNOSIS — Z79899 Other long term (current) drug therapy: Secondary | ICD-10-CM | POA: Insufficient documentation

## 2014-11-27 DIAGNOSIS — B029 Zoster without complications: Secondary | ICD-10-CM

## 2014-11-27 DIAGNOSIS — R05 Cough: Secondary | ICD-10-CM | POA: Diagnosis not present

## 2014-11-27 DIAGNOSIS — Z792 Long term (current) use of antibiotics: Secondary | ICD-10-CM | POA: Diagnosis not present

## 2014-11-27 DIAGNOSIS — B20 Human immunodeficiency virus [HIV] disease: Secondary | ICD-10-CM | POA: Diagnosis not present

## 2014-11-27 DIAGNOSIS — R21 Rash and other nonspecific skin eruption: Secondary | ICD-10-CM | POA: Diagnosis present

## 2014-11-27 MED ORDER — OXYCODONE HCL 5 MG PO TABS: 5.0000 mg | ORAL_TABLET | ORAL | Status: DC | PRN

## 2014-11-27 MED ORDER — ACYCLOVIR 800 MG PO TABS
800.0000 mg | ORAL_TABLET | Freq: Once | ORAL | Status: AC
  Administered 2014-11-27: 800 mg via ORAL
  Filled 2014-11-27: qty 1

## 2014-11-27 MED ORDER — HYDROXYZINE HCL 25 MG PO TABS: 25.0000 mg | ORAL_TABLET | Freq: Four times a day (QID) | ORAL | Status: DC

## 2014-11-27 MED ORDER — OXYCODONE-ACETAMINOPHEN 5-325 MG PO TABS
1.0000 | ORAL_TABLET | Freq: Once | ORAL | Status: AC
  Administered 2014-11-27: 1 via ORAL
  Filled 2014-11-27: qty 1

## 2014-11-27 MED ORDER — ACYCLOVIR 400 MG PO TABS
800.0000 mg | ORAL_TABLET | Freq: Every day | ORAL | Status: DC
Start: 2014-11-27 — End: 2014-11-30

## 2014-11-27 NOTE — ED Notes (Addendum)
Pt. Has a rash around her rt. Lateral rib into her back. Appears to be shingles.  She denies any fevers, n/v/d.    The area is painful and it broke out last night.  She had pain in the area prior to the rash,.  She denies any cough or cold symptoms

## 2014-11-27 NOTE — Discharge Instructions (Signed)

## 2014-11-27 NOTE — ED Provider Notes (Signed)
CSN: [id number]   Arrival date & time 11/27/14 1746  History  By signing my name below, I, Altamease Oiler, attest that this documentation has been prepared under the direction and in the presence of Debroah Baller, NP. Electronically Signed: Altamease Oiler, ED Scribe. 11/27/2014. 6:30 PM. Chief Complaint  Patient presents with  . Rash    shingles    HPI Patient is a 39 y.o. female presenting with rash. The history is provided by the patient. No language interpreter was used.  Rash Location:  Torso Torso rash location:  Upper back Quality: painful   Pain details:    Onset quality:  Gradual   Duration:  24 hours   Timing:  Constant   Progression:  Unchanged Severity:  Moderate Onset quality:  Gradual Timing:  Constant Progression:  Unchanged Chronicity:  Recurrent Relieved by:  Nothing Worsened by:  Nothing tried Ineffective treatments:  None tried Associated symptoms: URI   Associated symptoms: no fever, no nausea, no shortness of breath, no sore throat, no throat swelling, no tongue swelling and not vomiting    Paula Becker is a 39 y.o. female with history of HIV who presents to the Emergency Department complaining of a painful rash at the right upper back with onset yesterday. Pt rates the pain 9/10 n severity and notes that she has had shingles in this location in the past. Pt has a scheduled appointment with her PCP Dr, Jillyn Hidden in 3 days. Associated symptoms include cough and chills which are typical of a shingles outbreak for her. Pt is not driving. No fever. Past Medical History  Diagnosis Date  . HIV (human immunodeficiency virus infection) ([place])   . MRSA (methicillin resistant staph aureus) culture positive   . Necrotizing pneumonia ([place]) 07/2010  . Pneumonia 06/23/11    RLL patchy, nodular lung disease  . Anemia of chronic disease   . History of noncompliance with medical treatment   . Herpes     Past Surgical History  Procedure Laterality Date  . Cesarean  section  1998  . Breast surgery  03/2010    right; "don't know what they did"  . Video bronchoscopy  06/28/2011    Procedure: VIDEO BRONCHOSCOPY WITH FLUORO;  Surgeon: Chesley Mires, MD;  Location: [facility];  Service: Cardiopulmonary;  Laterality: Bilateral;    Family History  Problem Relation Age of Onset  . Lung disease Mother     passed away at 76 with lung disease  . Hypertension Father     Social History  Substance Use Topics  . Smoking status: Never Smoker   . Smokeless tobacco: Never Used  . Alcohol Use: No     Review of Systems  Constitutional: Positive for chills. Negative for fever.  HENT: Negative for sore throat.   Respiratory: Positive for cough. Negative for shortness of breath.   Gastrointestinal: Negative for nausea and vomiting.  Skin: Positive for rash.  All other systems reviewed and are negative.   Home Medications   Prior to Admission medications   Medication Sig Start Date End Date Taking? Authorizing Provider  acyclovir (ZOVIRAX) 400 MG tablet Take 2 tablets (800 mg total) by mouth 5 (five) times daily. 11/27/14   Ameirah Khatoon Bunnie Pion, NP  acyclovir ointment (ZOVIRAX) 5 % Apply 1 application topically every 3 (three) hours. 10/08/14   Campbell Riches, MD  clobetasol cream (TEMOVATE) 0.05 %  03/30/14   Historical Provider, MD  darunavir-cobicistat (PREZCOBIX) 800-150 MG per tablet Take 1 tablet by mouth daily. Swallow  whole. Do NOT crush, break or chew tablets. Take with food. 07/15/14   Campbell Riches, MD  dolutegravir (TIVICAY) 50 MG tablet Take 1 tablet (50 mg total) by mouth daily. 07/15/14   Campbell Riches, MD  fluticasone (CUTIVATE) 0.05 % cream  03/30/14   Historical Provider, MD  hydrOXYzine (ATARAX/VISTARIL) 25 MG tablet Take 1 tablet (25 mg total) by mouth every 6 (six) hours. 11/27/14   Lesleyann Fichter Bunnie Pion, NP  ibuprofen (ADVIL,MOTRIN) 800 MG tablet Take 1 tablet (800 mg total) by mouth 3 (three) times daily. 10/12/14   Hannah Muthersbaugh, PA-C   metroNIDAZOLE (FLAGYL) 500 MG tablet Take 1 tablet (500 mg total) by mouth 2 (two) times daily. 11/01/14   Tatyana Kirichenko, PA-C  mirtazapine (REMERON) 15 MG tablet Take 1 tablet (15 mg total) by mouth at bedtime. 10/12/11 11/11/11  Michel Bickers, MD  oxyCODONE (ROXICODONE) 5 MG immediate release tablet Take 1 tablet (5 mg total) by mouth every 4 (four) hours as needed for severe pain. 11/27/14   [place], NP    Allergies  Shellfish-derived products and Vancomycin  Triage Vitals: BP 110/71 mmHg  Pulse 93  Temp(Src) 98.4 F (36.9 C) (Oral)  Resp 16  SpO2 100%  LMP 10/25/2014  Physical Exam  Constitutional: She is oriented to person, place, and time. She appears well-developed and well-nourished. No distress.  HENT:  Head: Normocephalic.  Eyes: EOM are normal.  Neck: Neck supple.  Cardiovascular: Normal rate and regular rhythm.   Pulmonary/Chest: Effort normal and breath sounds normal. No respiratory distress. She has no wheezes. She has no rales.  CTAB  Abdominal: Soft. There is no tenderness.  Musculoskeletal: Normal range of motion.  Neurological: She is alert and oriented to person, place, and time. No cranial nerve deficit.  Skin: Skin is warm and dry.  vesicular areas that are tender on exam starting at the right lateral breast area and follow the dermatome to the right side of the back T-5 dermatome   Psychiatric: She has a normal mood and affect. Her behavior is normal.  Nursing note and vitals reviewed.   ED Course  Procedures  DIAGNOSTIC STUDIES: Oxygen Saturation is 100% on RA,  normal by my interpretation.    COORDINATION OF CARE: 6:27 PM Discussed treatment plan with pt at bedside and pt agreed to the plan.   MDM  39 y.o. female with pain and rash to the right side that started 24 hours ago consistent with zoster. Stable for d/c without fever and does not appear toxic. Patient is HIV positive and has an appointment in 3 days with her ID doctor. She will  return here if symptoms worsen before then. Acyclovir and percocet Rx given.    Final diagnoses:  Zoster    I personally performed the services described in this documentation, which was scribed in my presence. The recorded information has been reviewed and is accurate.      [address] [place], NP 11/27/14 1027  Merrily Pew, MD 11/27/14 [phone]

## 2014-11-30 ENCOUNTER — Ambulatory Visit (INDEPENDENT_AMBULATORY_CARE_PROVIDER_SITE_OTHER): Payer: Medicare Other | Admitting: Infectious Diseases

## 2014-11-30 ENCOUNTER — Encounter: Payer: Self-pay | Admitting: Infectious Diseases

## 2014-11-30 VITALS — BP 125/86 | HR 85 | Temp 97.7°F | Wt 124.0 lb

## 2014-11-30 DIAGNOSIS — B029 Zoster without complications: Secondary | ICD-10-CM | POA: Diagnosis not present

## 2014-11-30 DIAGNOSIS — Z79899 Other long term (current) drug therapy: Secondary | ICD-10-CM | POA: Diagnosis not present

## 2014-11-30 DIAGNOSIS — Z9114 Patient's other noncompliance with medication regimen: Secondary | ICD-10-CM | POA: Diagnosis not present

## 2014-11-30 DIAGNOSIS — Z113 Encounter for screening for infections with a predominantly sexual mode of transmission: Secondary | ICD-10-CM | POA: Diagnosis not present

## 2014-11-30 DIAGNOSIS — B20 Human immunodeficiency virus [HIV] disease: Secondary | ICD-10-CM

## 2014-11-30 MED ORDER — GABAPENTIN 300 MG PO CAPS: 300.0000 mg | ORAL_CAPSULE | Freq: Three times a day (TID) | ORAL | Status: DC

## 2014-11-30 MED ORDER — DOLUTEGRAVIR SODIUM 50 MG PO TABS: 50.0000 mg | ORAL_TABLET | Freq: Every day | ORAL | Status: DC

## 2014-11-30 MED ORDER — DARUNAVIR-COBICISTAT 800-150 MG PO TABS: 1.0000 | ORAL_TABLET | Freq: Every day | ORAL | Status: DC

## 2014-11-30 MED ORDER — VALACYCLOVIR HCL 1 G PO TABS: 1000.0000 mg | ORAL_TABLET | Freq: Two times a day (BID) | ORAL | Status: DC

## 2014-11-30 NOTE — Addendum Note (Signed)
Addended by: Lorne Skeens D on: 11/30/2014 03:26 PM   Modules accepted: Orders

## 2014-11-30 NOTE — Assessment & Plan Note (Signed)
Will have Paula Becker see her again.

## 2014-11-30 NOTE — Assessment & Plan Note (Signed)
Will restart/refill her meds. She believes she has run out of one.  Will have her seen by Ambre Offered/refused condoms.  Will f/u in 1 month

## 2014-11-30 NOTE — Assessment & Plan Note (Signed)
Will change her ACV to VTX, dose for her last Cr. She is offered to repeat this but she defers.  wil start her on neurontin as well.

## 2014-11-30 NOTE — Progress Notes (Signed)
   Subjective:    Patient ID: Paula Becker, female    DOB: [birth year], 39 y.o.   MRN: [id number]  HPI 39 yo F with HIV/AIDS, multidrug resistant virus, poor adherence/compliance. Also with recurrent MRSA furuncles, HSV. CRI.  Was seen by derm since last visit due to skin rashes.  Paula Becker was coming out to see her prior. Would seen earlier this summer and started on DTGV/DRVc. Was previous DTGV/DRVr/AZT.   HIV 1 RNA QUANT (copies/mL)  Date Value  09/30/2014 103*  10/22/2013 1105*  09/16/2013 488*   CD4 T CELL ABS (/uL)  Date Value  09/30/2014 60*  05/19/2013 60*  11/07/2012 140*    Was seen 10-28 in ED for shingles.  Was given ACV and percocet. Still having pain.  States she has been taking meds well, is "depressed" though. Is staying in house with her dad and his wife.  Has  not seen Paula Becker recently.  Had PAP this month, LSIL/CIN 1.   Review of Systems  Constitutional: Negative for fever, chills, appetite change and unexpected weight change.  Gastrointestinal: Negative for diarrhea and constipation.  Genitourinary: Negative for difficulty urinating.  Skin: Positive for rash.  menses irregular     Objective:   Physical Exam  Constitutional: She appears well-developed and well-nourished.  HENT:  Mouth/Throat: No oropharyngeal exudate.  Eyes: EOM are normal. Pupils are equal, round, and reactive to light.  Neck: Neck supple.  Cardiovascular: Normal rate, regular rhythm and normal heart sounds.   Pulmonary/Chest: Effort normal and breath sounds normal.  Abdominal: Soft. Bowel sounds are normal. There is no tenderness. There is no rebound.  Lymphadenopathy:    She has no cervical adenopathy.  Skin:         Assessment & Plan:

## 2014-12-25 ENCOUNTER — Other Ambulatory Visit: Payer: Self-pay | Admitting: Infectious Diseases

## 2014-12-28 NOTE — Telephone Encounter (Signed)
It's cream so I don't worry as much as inhaled.

## 2014-12-30 ENCOUNTER — Encounter: Payer: Medicare Other | Admitting: Obstetrics & Gynecology

## 2015-01-11 ENCOUNTER — Other Ambulatory Visit: Payer: Medicare Other

## 2015-01-18 ENCOUNTER — Ambulatory Visit (INDEPENDENT_AMBULATORY_CARE_PROVIDER_SITE_OTHER): Payer: Medicare Other | Admitting: Infectious Diseases

## 2015-01-18 ENCOUNTER — Encounter: Payer: Self-pay | Admitting: Infectious Diseases

## 2015-01-18 ENCOUNTER — Telehealth: Payer: Self-pay | Admitting: *Deleted

## 2015-01-18 VITALS — BP 117/77 | HR 108 | Temp 98.1°F | Ht 71.0 in | Wt 120.0 lb

## 2015-01-18 DIAGNOSIS — H918X9 Other specified hearing loss, unspecified ear: Secondary | ICD-10-CM | POA: Diagnosis not present

## 2015-01-18 DIAGNOSIS — Z113 Encounter for screening for infections with a predominantly sexual mode of transmission: Secondary | ICD-10-CM | POA: Diagnosis not present

## 2015-01-18 DIAGNOSIS — Z79899 Other long term (current) drug therapy: Secondary | ICD-10-CM | POA: Diagnosis not present

## 2015-01-18 DIAGNOSIS — A609 Anogenital herpesviral infection, unspecified: Secondary | ICD-10-CM | POA: Diagnosis not present

## 2015-01-18 DIAGNOSIS — B20 Human immunodeficiency virus [HIV] disease: Secondary | ICD-10-CM | POA: Diagnosis present

## 2015-01-18 DIAGNOSIS — H6120 Impacted cerumen, unspecified ear: Secondary | ICD-10-CM | POA: Diagnosis not present

## 2015-01-18 DIAGNOSIS — H6123 Impacted cerumen, bilateral: Secondary | ICD-10-CM | POA: Insufficient documentation

## 2015-01-18 MED ORDER — ACYCLOVIR 400 MG PO TABS: 400.0000 mg | ORAL_TABLET | Freq: Three times a day (TID) | ORAL | Status: DC

## 2015-01-18 NOTE — Assessment & Plan Note (Signed)
She is meeting with Ambre Needs to resume her home RN, resume her prev rx rtc in 2 months

## 2015-01-18 NOTE — Assessment & Plan Note (Signed)
disimpacted

## 2015-01-18 NOTE — Progress Notes (Signed)
   Subjective:    Patient ID: Paula Becker, female    DOB: [birth year], 39 y.o.   MRN: [id number]  HPI 39 yo F with HIV/AIDS, multidrug resistant virus, poor adherence/compliance. Also with recurrent MRSA furuncles, HSV. CRI.  Was  seen earlier this summer and started on DTGV/DRVc.  HIV 1 RNA QUANT (copies/mL)  Date Value  09/30/2014 103*  10/22/2013 1105*  09/16/2013 488*   CD4 T CELL ABS (/uL)  Date Value  09/30/2014 60*  05/19/2013 60*  11/07/2012 140*    Today complains of feeling poorly.  Bilateral ear aches. Denies sore throat. Also having "eye hurt", light hurts her eyes. Has been feeling ocld.  Has not been taking her medication- she relates this to stress.  Eating poorly.   Review of Systems  Constitutional: Positive for appetite change and unexpected weight change. Negative for fever and chills.  HENT: Positive for ear pain. Negative for rhinorrhea.   Gastrointestinal: Negative for diarrhea and constipation.  Genitourinary: Negative for difficulty urinating.  Neurological: Positive for headaches.       Objective:   Physical Exam  Constitutional: She appears well-developed and well-nourished.  HENT:  Ears:  Mouth/Throat: No oropharyngeal exudate.  Eyes: EOM are normal. Pupils are equal, round, and reactive to light.  Neck: Neck supple.  Cardiovascular: Normal rate, regular rhythm and normal heart sounds.   Pulmonary/Chest: Effort normal and breath sounds normal.  Abdominal: Soft. Bowel sounds are normal. There is no tenderness. There is no rebound.  Lymphadenopathy:    She has no cervical adenopathy.       Assessment & Plan:

## 2015-01-18 NOTE — Assessment & Plan Note (Signed)
Refill her ACV

## 2015-01-19 ENCOUNTER — Telehealth: Payer: Self-pay | Admitting: *Deleted

## 2015-01-19 ENCOUNTER — Ambulatory Visit: Payer: Self-pay | Admitting: *Deleted

## 2015-01-19 DIAGNOSIS — Z9114 Patient's other noncompliance with medication regimen: Secondary | ICD-10-CM

## 2015-01-19 DIAGNOSIS — B20 Human immunodeficiency virus [HIV] disease: Secondary | ICD-10-CM

## 2015-01-21 ENCOUNTER — Other Ambulatory Visit: Payer: Medicare Other | Admitting: *Deleted

## 2015-01-27 NOTE — Telephone Encounter (Signed)
RN received a call from the patient stating she cannot meet at 12pm today and would rather meet at 2pm. RN informed the patient that I will have to let her know if I can meet at 2pm.

## 2015-01-27 NOTE — Telephone Encounter (Signed)
RN received a text message from the patient wanting to reschedule our visit from 10:30 to 12pm today. RN agreed to see the patient today at 12pm instead of 10:30am

## 2015-01-27 NOTE — Telephone Encounter (Signed)
RN contacted the patient to inform her that I cannot meet at 2pm but would another time work for her. Patient stated she would rather meet me at the Clinic on Thursday at 12pm. RN arranged a meeting with the patient for 12pm on this cominig Thursday(12/22)

## 2015-01-27 NOTE — Telephone Encounter (Addendum)
RN meet the patient during her appt with Dr Johnnye Sima to offer [facility] again.  Patient states she has not been taking her medication for the last 5 months and she needs some help. Patient agreed to a visit tomorrow morning at 10:30am

## 2015-01-30 ENCOUNTER — Other Ambulatory Visit: Payer: Self-pay | Admitting: Infectious Diseases

## 2015-02-06 ENCOUNTER — Emergency Department (HOSPITAL_COMMUNITY): Payer: Medicare Other

## 2015-02-06 ENCOUNTER — Encounter (HOSPITAL_COMMUNITY): Payer: Self-pay

## 2015-02-06 ENCOUNTER — Inpatient Hospital Stay (HOSPITAL_COMMUNITY)
Admission: EM | Admit: 2015-02-06 | Discharge: 2015-02-11 | DRG: 974 | Disposition: A | Discharge status: Home / Self Care | Payer: Medicare Other | Attending: Internal Medicine | Admitting: Internal Medicine

## 2015-02-06 DIAGNOSIS — B20 Human immunodeficiency virus [HIV] disease: Secondary | ICD-10-CM | POA: Diagnosis present

## 2015-02-06 DIAGNOSIS — E872 Acidosis: Secondary | ICD-10-CM | POA: Diagnosis present

## 2015-02-06 DIAGNOSIS — R809 Proteinuria, unspecified: Secondary | ICD-10-CM | POA: Diagnosis present

## 2015-02-06 DIAGNOSIS — N179 Acute kidney failure, unspecified: Secondary | ICD-10-CM | POA: Diagnosis present

## 2015-02-06 DIAGNOSIS — Z91013 Allergy to seafood: Secondary | ICD-10-CM | POA: Diagnosis not present

## 2015-02-06 DIAGNOSIS — R6520 Severe sepsis without septic shock: Secondary | ICD-10-CM | POA: Diagnosis not present

## 2015-02-06 DIAGNOSIS — E43 Unspecified severe protein-calorie malnutrition: Secondary | ICD-10-CM | POA: Diagnosis present

## 2015-02-06 DIAGNOSIS — F329 Major depressive disorder, single episode, unspecified: Secondary | ICD-10-CM | POA: Diagnosis present

## 2015-02-06 DIAGNOSIS — Z21 Asymptomatic human immunodeficiency virus [HIV] infection status: Secondary | ICD-10-CM | POA: Diagnosis not present

## 2015-02-06 DIAGNOSIS — A409 Streptococcal sepsis, unspecified: Secondary | ICD-10-CM | POA: Diagnosis present

## 2015-02-06 DIAGNOSIS — A609 Anogenital herpesviral infection, unspecified: Secondary | ICD-10-CM | POA: Diagnosis present

## 2015-02-06 DIAGNOSIS — Z881 Allergy status to other antibiotic agents status: Secondary | ICD-10-CM | POA: Diagnosis not present

## 2015-02-06 DIAGNOSIS — R197 Diarrhea, unspecified: Secondary | ICD-10-CM | POA: Diagnosis present

## 2015-02-06 DIAGNOSIS — Z9114 Patient's other noncompliance with medication regimen: Secondary | ICD-10-CM

## 2015-02-06 DIAGNOSIS — B009 Herpesviral infection, unspecified: Secondary | ICD-10-CM | POA: Diagnosis present

## 2015-02-06 DIAGNOSIS — Z681 Body mass index (BMI) 19 or less, adult: Secondary | ICD-10-CM | POA: Diagnosis not present

## 2015-02-06 DIAGNOSIS — D638 Anemia in other chronic diseases classified elsewhere: Secondary | ICD-10-CM | POA: Diagnosis present

## 2015-02-06 DIAGNOSIS — Z79899 Other long term (current) drug therapy: Secondary | ICD-10-CM | POA: Diagnosis not present

## 2015-02-06 DIAGNOSIS — J189 Pneumonia, unspecified organism: Secondary | ICD-10-CM

## 2015-02-06 DIAGNOSIS — A419 Sepsis, unspecified organism: Secondary | ICD-10-CM | POA: Diagnosis present

## 2015-02-06 DIAGNOSIS — Z9119 Patient's noncompliance with other medical treatment and regimen: Secondary | ICD-10-CM | POA: Diagnosis not present

## 2015-02-06 DIAGNOSIS — R531 Weakness: Secondary | ICD-10-CM | POA: Diagnosis not present

## 2015-02-06 DIAGNOSIS — D509 Iron deficiency anemia, unspecified: Secondary | ICD-10-CM | POA: Diagnosis present

## 2015-02-06 DIAGNOSIS — B59 Pneumocystosis: Secondary | ICD-10-CM | POA: Diagnosis present

## 2015-02-06 DIAGNOSIS — E213 Hyperparathyroidism, unspecified: Secondary | ICD-10-CM | POA: Diagnosis present

## 2015-02-06 DIAGNOSIS — J13 Pneumonia due to Streptococcus pneumoniae: Secondary | ICD-10-CM | POA: Diagnosis not present

## 2015-02-06 DIAGNOSIS — E440 Moderate protein-calorie malnutrition: Secondary | ICD-10-CM | POA: Insufficient documentation

## 2015-02-06 DIAGNOSIS — R109 Unspecified abdominal pain: Secondary | ICD-10-CM

## 2015-02-06 DIAGNOSIS — D631 Anemia in chronic kidney disease: Secondary | ICD-10-CM | POA: Diagnosis not present

## 2015-02-06 DIAGNOSIS — N183 Chronic kidney disease, stage 3 (moderate): Secondary | ICD-10-CM | POA: Diagnosis not present

## 2015-02-06 DIAGNOSIS — E869 Volume depletion, unspecified: Secondary | ICD-10-CM | POA: Diagnosis not present

## 2015-02-06 DIAGNOSIS — R509 Fever, unspecified: Secondary | ICD-10-CM | POA: Diagnosis not present

## 2015-02-06 DIAGNOSIS — R05 Cough: Secondary | ICD-10-CM | POA: Diagnosis not present

## 2015-02-06 LAB — I-STAT BETA HCG BLOOD, ED (MC, WL, AP ONLY)

## 2015-02-06 LAB — CBC WITH DIFFERENTIAL/PLATELET
BASOS ABS: 0 10*3/uL (ref 0.0–0.1)
BASOS PCT: 0 %
EOS ABS: 0 10*3/uL (ref 0.0–0.7)
Eosinophils Relative: 0 %
HEMATOCRIT: 25.3 % — AB (ref 36.0–46.0)
HEMOGLOBIN: 8.3 g/dL — AB (ref 12.0–15.0)
LYMPHS PCT: 16 %
Lymphs Abs: 2.1 10*3/uL (ref 0.7–4.0)
MCH: 23.3 pg — AB (ref 26.0–34.0)
MCHC: 32.8 g/dL (ref 30.0–36.0)
MCV: 71.1 fL — ABNORMAL LOW (ref 78.0–100.0)
MONOS PCT: 6 %
Monocytes Absolute: 0.8 10*3/uL (ref 0.1–1.0)
NEUTROS PCT: 78 %
Neutro Abs: 10.2 10*3/uL — ABNORMAL HIGH (ref 1.7–7.7)
Platelets: 237 10*3/uL (ref 150–400)
RBC: 3.56 MIL/uL — ABNORMAL LOW (ref 3.87–5.11)
RDW: 17.5 % — ABNORMAL HIGH (ref 11.5–15.5)
WBC: 13.1 10*3/uL — ABNORMAL HIGH (ref 4.0–10.5)

## 2015-02-06 LAB — BASIC METABOLIC PANEL
Anion gap: 15 (ref 5–15)
BUN: 74 mg/dL — ABNORMAL HIGH (ref 6–20)
CHLORIDE: 108 mmol/L (ref 101–111)
CO2: 16 mmol/L — AB (ref 22–32)
CREATININE: 8.82 mg/dL — AB (ref 0.44–1.00)
Calcium: 7.3 mg/dL — ABNORMAL LOW (ref 8.9–10.3)
GFR calc non Af Amer: 5 mL/min — ABNORMAL LOW (ref >60)
GFR, EST AFRICAN AMERICAN: 6 mL/min — AB (ref >60)
GLUCOSE: 120 mg/dL — AB (ref 65–99)
Potassium: 4.3 mmol/L (ref 3.5–5.1)
Sodium: 139 mmol/L (ref 135–145)

## 2015-02-06 LAB — I-STAT CG4 LACTIC ACID, ED: Lactic Acid, Venous: 1.83 mmol/L (ref 0.5–2.0)

## 2015-02-06 LAB — CBG MONITORING, ED: Glucose-Capillary: 92 mg/dL (ref 65–99)

## 2015-02-06 MED ORDER — VANCOMYCIN HCL IN DEXTROSE 1-5 GM/200ML-% IV SOLN
1000.0000 mg | INTRAVENOUS | Status: AC
  Administered 2015-02-06: 1000 mg via INTRAVENOUS
  Filled 2015-02-06: qty 200

## 2015-02-06 MED ORDER — DEXTROSE 5 % IV SOLN: 270.0000 mg | Freq: Three times a day (TID) | INTRAVENOUS | Status: DC

## 2015-02-06 MED ORDER — SODIUM CHLORIDE 0.9 % IV SOLN
INTRAVENOUS | Status: AC
  Administered 2015-02-07 (×2): via INTRAVENOUS

## 2015-02-06 MED ORDER — VANCOMYCIN HCL 1000 MG IV SOLR: 20.0000 mg/kg | Freq: Once | INTRAVENOUS | Status: DC

## 2015-02-06 MED ORDER — ONDANSETRON HCL 4 MG PO TABS: 4.0000 mg | ORAL_TABLET | Freq: Four times a day (QID) | ORAL | Status: DC | PRN

## 2015-02-06 MED ORDER — DOLUTEGRAVIR SODIUM 50 MG PO TABS
50.0000 mg | ORAL_TABLET | Freq: Every day | ORAL | Status: DC
  Administered 2015-02-06: 50 mg via ORAL
  Filled 2015-02-06: qty 1

## 2015-02-06 MED ORDER — SULFAMETHOXAZOLE-TRIMETHOPRIM 400-80 MG/5ML IV SOLN
320.0000 mg | INTRAVENOUS | Status: DC
  Administered 2015-02-06: 320 mg via INTRAVENOUS
  Filled 2015-02-06 (×2): qty 20

## 2015-02-06 MED ORDER — SODIUM CHLORIDE 0.9 % IV BOLUS (SEPSIS)
1000.0000 mL | Freq: Once | INTRAVENOUS | Status: AC
  Administered 2015-02-06: 1000 mL via INTRAVENOUS

## 2015-02-06 MED ORDER — ONDANSETRON HCL 4 MG/2ML IJ SOLN
4.0000 mg | Freq: Four times a day (QID) | INTRAMUSCULAR | Status: DC | PRN
  Filled 2015-02-06: qty 2

## 2015-02-06 MED ORDER — VALACYCLOVIR HCL 500 MG PO TABS
500.0000 mg | ORAL_TABLET | Freq: Every day | ORAL | Status: DC
  Administered 2015-02-10: 500 mg via ORAL
  Filled 2015-02-06 (×5): qty 1

## 2015-02-06 MED ORDER — SODIUM CHLORIDE 0.9 % IJ SOLN
3.0000 mL | Freq: Two times a day (BID) | INTRAMUSCULAR | Status: DC
  Administered 2015-02-06 – 2015-02-10 (×5): 3 mL via INTRAVENOUS

## 2015-02-06 MED ORDER — PIPERACILLIN-TAZOBACTAM 3.375 G IVPB 30 MIN
3.3750 g | Freq: Once | INTRAVENOUS | Status: DC
  Administered 2015-02-06: 3.375 g via INTRAVENOUS
  Filled 2015-02-06: qty 50

## 2015-02-06 MED ORDER — SODIUM CHLORIDE 0.9 % IV SOLN
500.0000 mg | INTRAVENOUS | Status: DC
  Administered 2015-02-06: 500 mg via INTRAVENOUS
  Filled 2015-02-06 (×2): qty 0.5

## 2015-02-06 MED ORDER — DARUNAVIR-COBICISTAT 800-150 MG PO TABS
1.0000 | ORAL_TABLET | Freq: Every day | ORAL | Status: DC
  Filled 2015-02-06 (×2): qty 1

## 2015-02-06 MED ORDER — HEPARIN SODIUM (PORCINE) 5000 UNIT/ML IJ SOLN
5000.0000 [IU] | Freq: Three times a day (TID) | INTRAMUSCULAR | Status: DC
  Administered 2015-02-07 – 2015-02-10 (×4): 5000 [IU] via SUBCUTANEOUS
  Filled 2015-02-06 (×6): qty 1

## 2015-02-06 MED ORDER — ACETAMINOPHEN 325 MG PO TABS
650.0000 mg | ORAL_TABLET | Freq: Four times a day (QID) | ORAL | Status: DC | PRN
  Administered 2015-02-10: 650 mg via ORAL
  Filled 2015-02-06: qty 2

## 2015-02-06 MED ORDER — ACETAMINOPHEN 650 MG RE SUPP: 650.0000 mg | Freq: Four times a day (QID) | RECTAL | Status: DC | PRN

## 2015-02-06 NOTE — ED Provider Notes (Signed)
CSN: [id number]     Arrival date & time 02/06/15  1916 History   First MD Initiated Contact with Patient 02/06/15 1934     Chief Complaint  Patient presents with  . Weakness     (Consider location/radiation/quality/duration/timing/severity/associated sxs/prior Treatment) Patient is a 40 y.o. female presenting with weakness and general illness. The history is provided by the patient.  Weakness Pertinent negatives include no chest pain, no headaches and no shortness of breath.  Illness Severity:  Moderate Onset quality:  Sudden Duration:  2 days Timing:  Constant Progression:  Unchanged Chronicity:  New Associated symptoms: congestion, cough, fatigue and myalgias   Associated symptoms: no chest pain, no fever, no headaches, no nausea, no rhinorrhea, no shortness of breath, no vomiting and no wheezing     40 yo F with a chief complaint of a generalized illness. Patient with fevers and myalgias. This been going on for this today. Patient denies decreased oral intake denies difficulty with urination denies abdominal pain flank pain. Patient has had cough with sputum production that she describes as green. Patient is HIV positive with last CD4 count of 4. Patient is not been taking her medications for at least the past 3 weeks to 3 months.  Past Medical History  Diagnosis Date  . HIV (human immunodeficiency virus infection) ([place])   . MRSA (methicillin resistant staph aureus) culture positive   . Necrotizing pneumonia ([place]) 07/2010  . Pneumonia 06/23/11    RLL patchy, nodular lung disease  . Anemia of chronic disease   . History of noncompliance with medical treatment   . Herpes    Past Surgical History  Procedure Laterality Date  . Cesarean section  1998  . Breast surgery  03/2010    right; "don't know what they did"  . Video bronchoscopy  06/28/2011    Procedure: VIDEO BRONCHOSCOPY WITH FLUORO;  Surgeon: Chesley Mires, MD;  Location: [facility];  Service: Cardiopulmonary;   Laterality: Bilateral;   Family History  Problem Relation Age of Onset  . Lung disease Mother     passed away at 103 with lung disease  . Hypertension Father    Social History  Substance Use Topics  . Smoking status: Never Smoker   . Smokeless tobacco: Never Used  . Alcohol Use: No   OB History    No data available     Review of Systems  Constitutional: Positive for fatigue. Negative for fever and chills.  HENT: Positive for congestion. Negative for rhinorrhea.   Eyes: Negative for redness and visual disturbance.  Respiratory: Positive for cough. Negative for shortness of breath and wheezing.   Cardiovascular: Negative for chest pain and palpitations.  Gastrointestinal: Negative for nausea and vomiting.  Genitourinary: Negative for dysuria and urgency.  Musculoskeletal: Positive for myalgias. Negative for arthralgias.  Skin: Negative for pallor and wound.  Neurological: Positive for weakness. Negative for dizziness and headaches.      Allergies  Shellfish-derived products and Vancomycin  Home Medications   Prior to Admission medications   Medication Sig Start Date End Date Taking? Authorizing Provider  acyclovir (ZOVIRAX) 400 MG tablet Take 1 tablet (400 mg total) by mouth 3 (three) times daily. 01/18/15   Campbell Riches, MD  acyclovir ointment (ZOVIRAX) 5 % Apply 1 application topically every 3 (three) hours. Patient not taking: Reported on 01/18/2015 10/08/14   Campbell Riches, MD  darunavir-cobicistat (PREZCOBIX) 800-150 MG tablet Take 1 tablet by mouth daily. Swallow whole. Do NOT crush, break or chew  tablets. Take with food. Patient not taking: Reported on 01/18/2015 11/30/14   Campbell Riches, MD  dolutegravir (TIVICAY) 50 MG tablet Take 1 tablet (50 mg total) by mouth daily. Patient not taking: Reported on 01/18/2015 11/30/14   Campbell Riches, MD  gabapentin (NEURONTIN) 300 MG capsule TAKE 1 CAPSULE BY MOUTH THREE TIMES DAILY Patient not taking: Reported  on 01/18/2015 12/28/14   Campbell Riches, MD  hydrOXYzine (ATARAX/VISTARIL) 25 MG tablet Take 1 tablet (25 mg total) by mouth every 6 (six) hours. Patient not taking: Reported on 01/18/2015 11/27/14   Ashley Murrain, NP  ibuprofen (ADVIL,MOTRIN) 800 MG tablet Take 1 tablet (800 mg total) by mouth 3 (three) times daily. Patient not taking: Reported on 01/18/2015 10/12/14   Jarrett Soho Muthersbaugh, PA-C  metroNIDAZOLE (FLAGYL) 500 MG tablet Take 1 tablet (500 mg total) by mouth 2 (two) times daily. Patient not taking: Reported on 01/18/2015 11/01/14   Tatyana Kirichenko, PA-C  mirtazapine (REMERON) 15 MG tablet Take 1 tablet (15 mg total) by mouth at bedtime. 10/12/11 11/11/11  Michel Bickers, MD  oxyCODONE (ROXICODONE) 5 MG immediate release tablet Take 1 tablet (5 mg total) by mouth every 4 (four) hours as needed for severe pain. Patient not taking: Reported on 01/18/2015 11/27/14   Ashley Murrain, NP  valACYclovir (VALTREX) 1000 MG tablet TAKE 1 TABLET BY MOUTH TWICE DAILY Patient not taking: Reported on 02/06/2015 02/02/15   Campbell Riches, MD   BP 123/82 mmHg  Pulse 114  Temp(Src) 99.7 F (37.6 C) (Oral)  Resp 25  SpO2 100%  LMP 11/16/2014 Physical Exam  Constitutional: She is oriented to person, place, and time. She appears cachectic. No distress.  HENT:  Head: Normocephalic and atraumatic.  Eyes: EOM are normal. Pupils are equal, round, and reactive to light.  Neck: Normal range of motion. Neck supple.  Cardiovascular: Regular rhythm.  Tachycardia present.  Exam reveals no gallop and no friction rub.   No murmur heard. Pulmonary/Chest: Effort normal. She has no wheezes. She has no rales.  Abdominal: Soft. She exhibits no distension. There is no tenderness. There is no rebound and no guarding.  Musculoskeletal: She exhibits no edema or tenderness.  Neurological: She is alert and oriented to person, place, and time.  Skin: Skin is warm and dry. She is not diaphoretic.  Psychiatric: She has a  normal mood and affect. Her behavior is normal.  Nursing note and vitals reviewed.   ED Course  Procedures (including critical care time) Labs Review Labs Reviewed  BASIC METABOLIC PANEL - Abnormal; Notable for the following:    CO2 16 (*)    Glucose, Bld 120 (*)    BUN 74 (*)    Creatinine, Ser 8.82 (*)    Calcium 7.3 (*)    GFR calc non Af Amer 5 (*)    GFR calc Af Amer 6 (*)    All other components within normal limits  CBC WITH DIFFERENTIAL/PLATELET - Abnormal; Notable for the following:    WBC 13.1 (*)    RBC 3.56 (*)    Hemoglobin 8.3 (*)    HCT 25.3 (*)    MCV 71.1 (*)    MCH 23.3 (*)    RDW 17.5 (*)    Neutro Abs 10.2 (*)    All other components within normal limits  CULTURE, BLOOD (ROUTINE X 2)  CULTURE, BLOOD (ROUTINE X 2)  URINALYSIS, ROUTINE W REFLEX MICROSCOPIC (NOT AT [facility])  CBG MONITORING, ED  I-STAT BETA  HCG BLOOD, ED ([facility], [facility], [facility] ONLY)  I-STAT CG4 LACTIC ACID, ED    Imaging Review Dg Chest Port 1 View  02/06/2015  CLINICAL DATA:  Weakness.  Cough.  History of HIV. EXAM: PORTABLE CHEST 1 VIEW COMPARISON:  06/28/2011. FINDINGS: There are ill-defined interstitial densities bilaterally most evident in the left lower lung. Similar densities have been present on multiple prior chest radiographs. Lungs are hyperexpanded.  No pleural effusion or pneumothorax. Cardiac silhouette is normal in size. No mediastinal or hilar masses or convincing adenopathy. Bony thorax is intact. IMPRESSION: 1. Ill-defined interstitial opacities most evident in the left lower lung. Similar densities have been present on multiple prior studies. Findings may all be chronic. However, acute interstitial infection, most notably involving the left lower lobe, should be considered likely given the patient's history. 2. No focal consolidation is seen to suggest lobar pneumonia. No pulmonary edema. Electronically Signed   By: Lajean Manes M.D.   On: 02/06/2015 20:55   I have personally reviewed and  evaluated these images and lab results as part of my medical decision-making.   EKG Interpretation   Date/Time:  Saturday February 06 2015 19:35:41 EST Ventricular Rate:  128 PR Interval:  162 QRS Duration: 72 QT Interval:  268 QTC Calculation: 391 R Axis:   51 Text Interpretation:  Sinus tachycardia RSR' in V1 or V2, right VCD or RVH  No significant change since last tracing Confirmed by Geovannie Vilar MD, DANIEL  [phone]) on 02/06/2015 8:13:34 PM      MDM   Final diagnoses:  HCAP (healthcare-associated pneumonia)  Acute renal failure, unspecified acute renal failure type ([place])    40 yo F with a significant past medical history of HIV with last CD4 count of 4 comes in with a chief complaint of generalized myalgias and fever. Patient found to have acute renal failure with a GFR of 6. Patient tachycardic into the 140s. Concern for possible systemic infection, discussed with pharmacy will order vanc and meropenem. Also will cover with Bactrim.  Admit.  CRITICAL CARE Performed by: Cecilio Asper   Total critical care time: 35 minutes  Critical care time was exclusive of separately billable procedures and treating other patients.  Critical care was necessary to treat or prevent imminent or life-threatening deterioration.  Critical care was time spent personally by me on the following activities: development of treatment plan with patient and/or surrogate as well as nursing, discussions with consultants, evaluation of patient's response to treatment, examination of patient, obtaining history from patient or surrogate, ordering and performing treatments and interventions, ordering and review of laboratory studies, ordering and review of radiographic studies, pulse oximetry and re-evaluation of patient's condition.  The patients results and plan were reviewed and discussed.   Any x-rays performed were independently reviewed by myself.   Differential diagnosis were considered with the  presenting HPI.  Medications  vancomycin (VANCOCIN) IVPB 1000 mg/200 mL premix (1,000 mg Intravenous New Bag/Given 02/06/15 2139)  meropenem (MERREM) 500 mg in sodium chloride 0.9 % 50 mL IVPB (500 mg Intravenous New Bag/Given 02/06/15 2139)  sulfamethoxazole-trimethoprim (BACTRIM) 320 mg in dextrose 5 % 500 mL IVPB (not administered)  sodium chloride 0.9 % bolus 1,000 mL (1,000 mLs Intravenous New Bag/Given 02/06/15 2020)  sodium chloride 0.9 % bolus 1,000 mL (1,000 mLs Intravenous New Bag/Given 02/06/15 2140)    Filed Vitals:   02/06/15 2030 02/06/15 2045 02/06/15 2100 02/06/15 2115  BP: 125/88 117/92 120/87 123/82  Pulse: 129 123 118 114  Temp:  TempSrc:      Resp: 29 25 17 25   SpO2: 100% 100% 100% 100%    Final diagnoses:  HCAP (healthcare-associated pneumonia)  Acute renal failure, unspecified acute renal failure type [facility])    Admission/ observation were discussed with the admitting physician, patient and/or family and they are comfortable with the plan.     Deno Etienne, DO 02/06/15 2152

## 2015-02-06 NOTE — Progress Notes (Signed)
Received report. Room ready for patient's arrival.  Shelbie Hutching, RN, BSN Phone # [phone]

## 2015-02-06 NOTE — Progress Notes (Signed)
ANTIBIOTIC CONSULT NOTE - INITIAL  Pharmacy Consult for Paula Becker, [place] Indication: pneumonia  Allergies  Allergen Reactions  . Shellfish-Derived Products Swelling    Facial swelling  . Vancomycin Rash    Patient Measurements:   Adjusted Body Weight:   Vital Signs: Temp: 99.7 F (37.6 C) (01/07 1924) Temp Source: Oral (01/07 1924) BP: 130/84 mmHg (01/07 1945) Pulse Rate: 53 (01/07 1945) Intake/Output from previous day:   Intake/Output from this shift:    Labs:  Recent Labs  02/06/15 1924  WBC 13.1*  HGB 8.3*  PLT 237  CREATININE 8.82*   CrCl cannot be calculated (Unknown ideal weight.). No results for input(s): VANCOTROUGH, VANCOPEAK, VANCORANDOM, GENTTROUGH, GENTPEAK, GENTRANDOM, TOBRATROUGH, TOBRAPEAK, TOBRARND, AMIKACINPEAK, AMIKACINTROU, AMIKACIN in the last 72 hours.   Microbiology: No results found for this or any previous visit (from the past 720 hour(s)).  Medical History: Past Medical History  Diagnosis Date  . HIV (human immunodeficiency virus infection) ([employer])   . MRSA (methicillin resistant staph aureus) culture positive   . Necrotizing pneumonia ([place]) 07/2010  . Pneumonia 06/23/11    RLL patchy, nodular lung disease  . Anemia of chronic disease   . History of noncompliance with medical treatment   . Herpes     Medications: see med rec  Assessment: Weakness, myalgias, fever, Tachy in the 59's  40 yo F with HIV/AIDS, multidrug resistant virus, poor adherence/compliance. Also with recurrent MRSA furuncles, HSV. CRI. Shingles 10/16. Start Empiric abx to coverage for multiple infections.  -Temp 99.7, 125/88, HR 129 -WBC 13.1, Scr 8.82, Last CD4 60 (09/29/14)  Goal of Therapy:  Vancomycin trough level 15-20 mcg/ml  Plan:  - Vanco 1g IV x 1 (20mg /kg). MD aware of allergy. F/u renal function for future scheduling of doses. - Merrem 500mg  IV q24h - Bactrim 5mg /kg (rounded to 320mg ) q24h IV   Paula Becker, PharmD, BCPS Clinical  Staff Pharmacist Pager [phone]  Paula Becker 02/06/2015,8:33 PM

## 2015-02-06 NOTE — ED Notes (Addendum)
Pt here with c/o not feeling well for a couple of days. She states she hasn't been taking HIV medication, last dose was 2 weeks ago due to "not being able to eat anything." She was having abdominal pain earlier today but it went away.

## 2015-02-06 NOTE — H&P (Signed)
Date: 02/06/2015               Patient Name:  Paula Becker MRN: [id number]  DOB: 17-Mar-1975 Age / Sex: 40 y.o., female   PCP: Campbell Riches, MD         Medical Service: Internal Medicine Teaching Service         Attending Physician: Dr. Aldine Contes, MD    First Contact: Dr. Tiburcio Pea Pager: [phone]  Second Contact: Dr. Hulen Luster Pager: [phone]       After Hours (After 5p/  First Contact Pager: [phone]  weekends / holidays): Second Contact Pager: [phone]   Chief Complaint: generalized weakness, myalgias, diarrhea  History of Present Illness: [name] is a 40 yo female with HIV (last CD4 count 2 in Aug 2016), HSV, and recurrent MRSA furuncles, presenting with generalized weakness and diarrhea.  Patient states she has had leg weakness for about the last month, as well as knee pain, sometimes causing her legs to give out.  She has felt so weak, that she has been too tired to go to the bathroom, instead having 3 loose stools on herself over the last day.  She denies numbness or tingling of the legs.  She denies voluminous watery stool, hematochezia, or melena.  At baseline, she states she's normally lying down at home and sleeping all day.  She says she does not do much at home.  She performs her own ADLs, but relies on her family (father, son, daughter-in-law) to perform IADLs.    She reports having a cough for the last 2-3 weeks, productive of green sputum.  However, she normally has a similar cough every few days.  She states the current cough is about normal.  She has had a HA for the last 2 days. She denies fever, sore throat, congestion, or SOB.  She has had chills, runny nose, and muscle pain.  She has also had decreased PO intake for the last week 2/2 decreased appetite.  She says she is eating her normal amount, though she does eat less than she should and loses her appetite quickly.  She then does not take her ART meds because they cause abdominal pain if she has not eaten.   She has not taken her ART for the last week.  She is currently having mild abdominal pain, and will sometimes gag, but denies nausea or vomiting.   She denies weight loss of night sweats.  She denies depression.  She denies dysuria or hematuria.  She only takes Ibuprofen one tablet once in a blue moon.  She has not been taking her Acyclovir or Valtrex for about a month.   In the ED, she was tachycardic to 120s, satting 100% on RA.  WBC elevated to 13 and Cr of 8.82.  CXR showed chronic interstitial densities bilaterally, most notable in left lower lobe, but no lobar opacities.  She received one dose of Vanc, Zosyn, Meropenem, and Bactrim.    Meds: Current Facility-Administered Medications  Medication Dose Route Frequency Provider Last Rate Last Dose  . 0.9 %  sodium chloride infusion   Intravenous Continuous Alexa Sherral Hammers, MD      . acetaminophen (TYLENOL) tablet 650 mg  650 mg Oral Q6H PRN Alexa Sherral Hammers, MD       Or  . acetaminophen (TYLENOL) suppository 650 mg  650 mg Rectal Q6H PRN Alexa Sherral Hammers, MD      . darunavir-cobicistat (PREZCOBIX) 800-150 MG per tablet 1 tablet  1 tablet Oral Daily Alexa Sherral Hammers, MD      . dolutegravir (TIVICAY) tablet 50 mg  50 mg Oral Daily Alexa Sherral Hammers, MD      . Derrill Memo ON 02/07/2015] heparin injection 5,000 Units  5,000 Units Subcutaneous 3 times per day Alexa Sherral Hammers, MD      . meropenem (MERREM) 500 mg in sodium chloride 0.9 % 50 mL IVPB  500 mg Intravenous Q24H Karren Cobble, [facility]   Stopped at 02/06/15 2209  . ondansetron (ZOFRAN) tablet 4 mg  4 mg Oral Q6H PRN Alexa Sherral Hammers, MD       Or  . ondansetron [facility]) injection 4 mg  4 mg Intravenous Q6H PRN Alexa Sherral Hammers, MD      . sodium chloride 0.9 % injection 3 mL  3 mL Intravenous Q12H Alexa Sherral Hammers, MD      . sulfamethoxazole-trimethoprim (BACTRIM) 320 mg in dextrose 5 % 500 mL IVPB  320 mg Intravenous Q24H Karren Cobble, RPH 346.7 mL/hr at 02/06/15 2230  320 mg at 02/06/15 2230    Allergies: Allergies as of 02/06/2015 - Review Complete 02/06/2015  Allergen Reaction Noted  . Shellfish-derived products Swelling 06/09/2010  . Vancomycin Rash 04/01/2010   Past Medical History  Diagnosis Date  . HIV (human immunodeficiency virus infection) ([place])   . MRSA (methicillin resistant staph aureus) culture positive   . Necrotizing pneumonia ([place]) 07/2010  . Pneumonia 06/23/11    RLL patchy, nodular lung disease  . Anemia of chronic disease   . History of noncompliance with medical treatment   . Herpes    Past Surgical History  Procedure Laterality Date  . Cesarean section  1998  . Breast surgery  03/2010    right; "don't know what they did"  . Video bronchoscopy  06/28/2011    Procedure: VIDEO BRONCHOSCOPY WITH FLUORO;  Surgeon: Chesley Mires, MD;  Location: [facility];  Service: Cardiopulmonary;  Laterality: Bilateral;   Family History  Problem Relation Age of Onset  . Lung disease Mother     passed away at 60 with lung disease  . Hypertension Father    Social History   Social History  . Marital Status: Single    Spouse Name: N/A  . Number of Children: N/A  . Years of Education: N/A   Occupational History  . Not on file.   Social History Main Topics  . Smoking status: Never Smoker   . Smokeless tobacco: Never Used  . Alcohol Use: No  . Drug Use: No  . Sexual Activity: Not on file     Comment: declined condoms   Other Topics Concern  . Not on file   Social History Narrative   Lives in [place] in her house.   Has support from her Dad for doctor visits.   Didn't work ever. Not working now.    Review of Systems: Pertinent items are noted in HPI.  Physical Exam: Blood pressure 117/65, pulse 118, temperature 100 F (37.8 C), temperature source Oral, resp. rate 20, weight 123 lb 1.6 oz (55.838 kg), last menstrual period 11/16/2014, SpO2 99 %. Physical Exam  Constitutional: She is oriented to person, place, and time.  Frail,  chronically ill appearing female, lying in bed.  Speaking in soft voice.  NAD.  HENT:  Head: Normocephalic and atraumatic.  MM dry. No plaques.  Eyes: EOM are normal. Pupils are equal, round, and reactive to light.  Neck: No JVD present. No tracheal deviation present.  Cardiovascular: Intact distal pulses.   Tachycardic. Regular rhythm.  No murmurs appreciated.  Pulmonary/Chest: Effort normal. No stridor. No respiratory distress. She has no wheezes.  Decreased air movement throughout.  Mild inspiratory crackles bilaterally in lower lung fields.  Abdominal: Soft. She exhibits no distension. There is no tenderness. There is no rebound and no guarding.  Musculoskeletal: She exhibits no edema.  Neurological: She is alert and oriented to person, place, and time.  Skin: Skin is warm and dry.  Left gluteal cleft with 3 grouped vesicles on erythematous base and scattered crusted lesions.     Lab results: Basic Metabolic Panel:  Recent Labs  02/06/15 1924  NA 139  K 4.3  CL 108  CO2 16*  GLUCOSE 120*  BUN 74*  CREATININE 8.82*  CALCIUM 7.3*   Liver Function Tests: No results for input(s): AST, ALT, ALKPHOS, BILITOT, PROT, ALBUMIN in the last 72 hours. No results for input(s): LIPASE, AMYLASE in the last 72 hours. No results for input(s): AMMONIA in the last 72 hours. CBC:  Recent Labs  02/06/15 1924  WBC 13.1*  NEUTROABS 10.2*  HGB 8.3*  HCT 25.3*  MCV 71.1*  PLT 237   Cardiac Enzymes: No results for input(s): CKTOTAL, CKMB, CKMBINDEX, TROPONINI in the last 72 hours. BNP: No results for input(s): PROBNP in the last 72 hours. D-Dimer: No results for input(s): DDIMER in the last 72 hours. CBG:  Recent Labs  02/06/15 2032  GLUCAP 92   Hemoglobin A1C: No results for input(s): HGBA1C in the last 72 hours. Fasting Lipid Panel: No results for input(s): CHOL, HDL, LDLCALC, TRIG, CHOLHDL, LDLDIRECT in the last 72 hours. Thyroid Function Tests: No results for input(s):  TSH, T4TOTAL, FREET4, T3FREE, THYROIDAB in the last 72 hours. Anemia Panel: No results for input(s): VITAMINB12, FOLATE, FERRITIN, TIBC, IRON, RETICCTPCT in the last 72 hours. Coagulation: No results for input(s): LABPROT, INR in the last 72 hours. Urine Drug Screen: Drugs of Abuse  No results found for: LABOPIA, COCAINSCRNUR, LABBENZ, AMPHETMU, THCU, LABBARB  Alcohol Level: No results for input(s): ETH in the last 72 hours. Urinalysis: No results for input(s): COLORURINE, LABSPEC, PHURINE, GLUCOSEU, HGBUR, BILIRUBINUR, KETONESUR, PROTEINUR, UROBILINOGEN, NITRITE, LEUKOCYTESUR in the last 72 hours.  Invalid input(s): APPERANCEUR Misc. Labs:   Imaging results:  Dg Chest Port 1 View  02/06/2015  CLINICAL DATA:  Weakness.  Cough.  History of HIV. EXAM: PORTABLE CHEST 1 VIEW COMPARISON:  06/28/2011. FINDINGS: There are ill-defined interstitial densities bilaterally most evident in the left lower lung. Similar densities have been present on multiple prior chest radiographs. Lungs are hyperexpanded.  No pleural effusion or pneumothorax. Cardiac silhouette is normal in size. No mediastinal or hilar masses or convincing adenopathy. Bony thorax is intact. IMPRESSION: 1. Ill-defined interstitial opacities most evident in the left lower lung. Similar densities have been present on multiple prior studies. Findings may all be chronic. However, acute interstitial infection, most notably involving the left lower lobe, should be considered likely given the patient's history. 2. No focal consolidation is seen to suggest lobar pneumonia. No pulmonary edema. Electronically Signed   By: Lajean Manes M.D.   On: 02/06/2015 20:55    Other results: EKG: sinus tachycardia.  Assessment & Plan by Problem: Principal Problem:   Sepsis ([place]) Active Problems:   Human immunodeficiency virus (HIV) disease ([place])   Anemia of chronic disease   Severe protein-calorie malnutrition ([place])   HSV (herpes simplex virus)  anogenital infection  Ms. Flagler is a 40 yo  female with HIV (last CD4 count 60 in Aug 2016), HSV, and recurrent MRSA furuncles, presenting with generalized weakness and diarrhea.   Sepsis 2/2 Pneumocystis PNA: Chronically ill female with HIV/AIDS, presenting with tachycardia, 2-3 weeks cough productive of green sputum and diffuse interstitial pulmonary infiltrates on CXR most consistent with PCP PNA.  However, given her severe immunosuppression, the differential remains broad, including viral, bacterial, or fungal PNA.  Therefore, we will continue broad spectrum abx overnight. Consider narrowing to Bactrim alone for mild PCP PNA if blood cultures remain negative. - Recommend ID consult in AM - Tivicay and Prezcobix - Vanc, Meropenem, Bactrim [ ]  Repeat 2-view CXR in AM  Acute Renal Failure: Normal Cr baseline 1.5-1.6, now presenting with Cr 8.82. Etiology unclear and likely multifactorial in the setting of decreased PO intake, sepsis, and medication.  However, patient reports not having taken any of her medications for > 1 week.  U/A ordered in ED pending.  Will provide IVF and monitor Cr. - NS @ 125 mL/hr [ ]  FeNa  Diarrhea: Patient reports 3 loose stools while too weak to get out of bed.  Given her immunosuppression, infectious etiologies are definitely possible.  Patient attributes her diarrhea to meal eaten yesterday.  Will investigate stool panel and monitor.  [ ]  Stool PCR panel  Weakness: Likely chronic due to deconditioning and acutely worsened in setting of infection.  Patient admits she sleeps all day in bed and has severe protein calorie malnutrition.  She denies weight loss or night sweats concerning for underlying malignancy. - PT/OT/Nutrition    HIV/AIDS: Last CD4 count 60 in Aug 2016.  Patient has long h/o medication noncompliance.  Depression likely playing a role in her weakness, malnutrition, and noncompliance.  Consider restarting Remeron for antidepressant and appetite  effects. - Tivicay and Prezcobix  HSV: Patient has active lesions on gluteal cleft, but is also in acute renal failure.  Will renally dose Valtrex - Valacyclovir 500 mg daily  FEN/GI: - Regular  DVT Ppx: Heparin   Dispo: Disposition is deferred at this time, awaiting improvement of current medical problems.   The patient does have a current PCP Campbell Riches, MD) and does need an [facility] hospital follow-up appointment after discharge.  The patient does not have transportation limitations that hinder transportation to clinic appointments.  Signed: Iline Oven, MD, PhD 02/06/2015, 11:08 PM

## 2015-02-06 NOTE — Progress Notes (Signed)
Admission note:  Arrival Method: Pt arrived on stretcher from ED Mental Orientation: Alert and oriented x 4 Telemetry: telemetry box 6e24 applied. CCMT notified. Verified with Adaku, RN Assessment: Completed Skin: Three blisters noted to pt's buttock. Foam dressing placed. Skin assessed with Mady Gemma, RN.   IV: Right AC and Right forearm. Sites clean, dry and intact.  Pain: Pt states no pain at this time Tubes: N/A Safety Measures: Bed in lowest position, non-slip socks placed, call light within reach, bed alarm activated Fall Prevention Safety Plan: Reviewed with patient. Patient is a high fall risk Admission Screening: In progress 6700 Orientation: Patient has been oriented to the unit, staff and to the room. Orders have been reviewed and implemented. Call light within reach, will continue to monitor patient.   Shelbie Hutching, RN, BSN

## 2015-02-07 ENCOUNTER — Inpatient Hospital Stay (HOSPITAL_COMMUNITY): Payer: Medicare Other

## 2015-02-07 DIAGNOSIS — A419 Sepsis, unspecified organism: Secondary | ICD-10-CM

## 2015-02-07 DIAGNOSIS — E46 Unspecified protein-calorie malnutrition: Secondary | ICD-10-CM

## 2015-02-07 DIAGNOSIS — Z8614 Personal history of Methicillin resistant Staphylococcus aureus infection: Secondary | ICD-10-CM

## 2015-02-07 DIAGNOSIS — B20 Human immunodeficiency virus [HIV] disease: Principal | ICD-10-CM

## 2015-02-07 DIAGNOSIS — Z9114 Patient's other noncompliance with medication regimen: Secondary | ICD-10-CM

## 2015-02-07 DIAGNOSIS — R197 Diarrhea, unspecified: Secondary | ICD-10-CM

## 2015-02-07 DIAGNOSIS — N179 Acute kidney failure, unspecified: Secondary | ICD-10-CM

## 2015-02-07 DIAGNOSIS — R6520 Severe sepsis without septic shock: Secondary | ICD-10-CM

## 2015-02-07 DIAGNOSIS — B009 Herpesviral infection, unspecified: Secondary | ICD-10-CM

## 2015-02-07 LAB — CBC WITH DIFFERENTIAL/PLATELET
BASOS ABS: 0 10*3/uL (ref 0.0–0.1)
Basophils Relative: 0 %
Eosinophils Absolute: 0 10*3/uL (ref 0.0–0.7)
Eosinophils Relative: 0 %
HEMATOCRIT: 18.9 % — AB (ref 36.0–46.0)
Hemoglobin: 6.1 g/dL — CL (ref 12.0–15.0)
LYMPHS ABS: 1 10*3/uL (ref 0.7–4.0)
LYMPHS PCT: 9 %
MCH: 22.9 pg — ABNORMAL LOW (ref 26.0–34.0)
MCHC: 32.3 g/dL (ref 30.0–36.0)
MCV: 71.1 fL — ABNORMAL LOW (ref 78.0–100.0)
MONOS PCT: 11 %
Monocytes Absolute: 1.2 10*3/uL — ABNORMAL HIGH (ref 0.1–1.0)
NEUTROS PCT: 80 %
Neutro Abs: 8.5 10*3/uL — ABNORMAL HIGH (ref 1.7–7.7)
Platelets: 185 10*3/uL (ref 150–400)
RBC: 2.66 MIL/uL — AB (ref 3.87–5.11)
RDW: 17.5 % — AB (ref 11.5–15.5)
WBC: 10.7 10*3/uL — AB (ref 4.0–10.5)

## 2015-02-07 LAB — MAGNESIUM
Magnesium: 1 mg/dL — ABNORMAL LOW (ref 1.7–2.4)
Magnesium: 1.7 mg/dL (ref 1.7–2.4)

## 2015-02-07 LAB — PROTEIN / CREATININE RATIO, URINE
Creatinine, Urine: 27.94 mg/dL
PROTEIN CREATININE RATIO: 30.78 mg/mg{creat} — AB (ref 0.00–0.15)
Total Protein, Urine: 860 mg/dL

## 2015-02-07 LAB — IRON AND TIBC
IRON: 9 ug/dL — AB (ref 28–170)
SATURATION RATIOS: 7 % — AB (ref 10.4–31.8)
TIBC: 127 ug/dL — AB (ref 250–450)
UIBC: 118 ug/dL

## 2015-02-07 LAB — URINALYSIS, ROUTINE W REFLEX MICROSCOPIC
Bilirubin Urine: NEGATIVE
Glucose, UA: 100 mg/dL — AB
Ketones, ur: NEGATIVE mg/dL
NITRITE: NEGATIVE
PH: 7.5 (ref 5.0–8.0)
Protein, ur: >300 mg/dL — AB
SPECIFIC GRAVITY, URINE: 1.017 (ref 1.005–1.030)

## 2015-02-07 LAB — COMPREHENSIVE METABOLIC PANEL
ALK PHOS: 67 U/L (ref 38–126)
ALT: 7 U/L — AB (ref 14–54)
ANION GAP: 10 (ref 5–15)
AST: 13 U/L — ABNORMAL LOW (ref 15–41)
Albumin: <1 g/dL — ABNORMAL LOW (ref 3.5–5.0)
BILIRUBIN TOTAL: 0.3 mg/dL (ref 0.3–1.2)
BUN: 67 mg/dL — AB (ref 6–20)
CO2: 15 mmol/L — ABNORMAL LOW (ref 22–32)
Calcium: 6.3 mg/dL — CL (ref 8.9–10.3)
Chloride: 112 mmol/L — ABNORMAL HIGH (ref 101–111)
Creatinine, Ser: 8.15 mg/dL — ABNORMAL HIGH (ref 0.44–1.00)
GFR, EST AFRICAN AMERICAN: 6 mL/min — AB (ref >60)
GFR, EST NON AFRICAN AMERICAN: 6 mL/min — AB (ref >60)
Glucose, Bld: 96 mg/dL (ref 65–99)
Potassium: 3.7 mmol/L (ref 3.5–5.1)
Sodium: 137 mmol/L (ref 135–145)
TOTAL PROTEIN: 6.3 g/dL — AB (ref 6.5–8.1)

## 2015-02-07 LAB — VITAMIN B12: VITAMIN B 12: 383 pg/mL (ref 180–914)

## 2015-02-07 LAB — URINE MICROSCOPIC-ADD ON

## 2015-02-07 LAB — CREATININE, URINE, RANDOM: Creatinine, Urine: 57.72 mg/dL

## 2015-02-07 LAB — STREP PNEUMONIAE URINARY ANTIGEN: STREP PNEUMO URINARY ANTIGEN: POSITIVE — AB

## 2015-02-07 LAB — FERRITIN: FERRITIN: 207 ng/mL (ref 11–307)

## 2015-02-07 LAB — CBC
HEMATOCRIT: 23.8 % — AB (ref 36.0–46.0)
HEMOGLOBIN: 7.7 g/dL — AB (ref 12.0–15.0)
MCH: 24.1 pg — ABNORMAL LOW (ref 26.0–34.0)
MCHC: 32.4 g/dL (ref 30.0–36.0)
MCV: 74.6 fL — AB (ref 78.0–100.0)
Platelets: 173 10*3/uL (ref 150–400)
RBC: 3.19 MIL/uL — AB (ref 3.87–5.11)
RDW: 19.6 % — AB (ref 11.5–15.5)
WBC: 8.6 10*3/uL (ref 4.0–10.5)

## 2015-02-07 LAB — RETICULOCYTES
RBC.: 3.01 MIL/uL — ABNORMAL LOW (ref 3.87–5.11)
RETIC CT PCT: 0.5 % (ref 0.4–3.1)
Retic Count, Absolute: 15.1 10*3/uL — ABNORMAL LOW (ref 19.0–186.0)

## 2015-02-07 LAB — FOLATE: Folate: 21.6 ng/mL (ref >5.9)

## 2015-02-07 LAB — SODIUM, URINE, RANDOM: SODIUM UR: 66 mmol/L

## 2015-02-07 LAB — LACTATE DEHYDROGENASE: LDH: 150 U/L (ref 98–192)

## 2015-02-07 LAB — PREPARE RBC (CROSSMATCH)

## 2015-02-07 MED ORDER — CLINDAMYCIN HCL 300 MG PO CAPS
450.0000 mg | ORAL_CAPSULE | Freq: Four times a day (QID) | ORAL | Status: DC
  Filled 2015-02-07 (×4): qty 1

## 2015-02-07 MED ORDER — SODIUM CHLORIDE 0.9 % IV SOLN
Freq: Once | INTRAVENOUS | Status: AC
  Administered 2015-02-07: 12:00:00 via INTRAVENOUS

## 2015-02-07 MED ORDER — DEXTROSE 5 % IV SOLN
450.0000 mg | Freq: Four times a day (QID) | INTRAVENOUS | Status: DC
  Administered 2015-02-07 – 2015-02-08 (×4): 450 mg via INTRAVENOUS
  Filled 2015-02-07 (×6): qty 3

## 2015-02-07 MED ORDER — PHENOL 1.4 % MT LIQD: 1.0000 | OROMUCOSAL | Status: DC | PRN

## 2015-02-07 MED ORDER — MAGNESIUM SULFATE 2 GM/50ML IV SOLN
2.0000 g | Freq: Once | INTRAVENOUS | Status: AC
  Administered 2015-02-07: 2 g via INTRAVENOUS
  Filled 2015-02-07: qty 50

## 2015-02-07 MED ORDER — SODIUM BICARBONATE 8.4 % IV SOLN
INTRAVENOUS | Status: DC
  Administered 2015-02-07 – 2015-02-08 (×2): via INTRAVENOUS
  Filled 2015-02-07 (×5): qty 1000

## 2015-02-07 MED ORDER — PRIMAQUINE PHOSPHATE 26.3 MG PO TABS
30.0000 mg | ORAL_TABLET | Freq: Every day | ORAL | Status: DC
  Filled 2015-02-07 (×2): qty 2

## 2015-02-07 MED ORDER — DEXTROSE 5 % IV SOLN
1.0000 g | INTRAVENOUS | Status: DC
  Administered 2015-02-07 – 2015-02-10 (×4): 1 g via INTRAVENOUS
  Filled 2015-02-07 (×5): qty 10

## 2015-02-07 NOTE — Progress Notes (Signed)
Subjective:  No acute events overnight. Pt says that she came in because she was feeling weak and had diarrhea for 1 day. She also had complaints of productive cough with green nonbloody sputum for 1 week.  Denies any hematochezia or melena.   Later on, I was paged that patient was refusing to take oral pills, and earlier had refused blood draw. Went and talked to pt with nurse and said pt did not want to take oral pills because 'throat hurting' and she used to take pills in childhood and so did not want it.  I advised nurse and pt to try taking it in crushed form in applesauce.   She says she has not been taking her HIV meds for 2 weeks but given her dislike for oral pills, I suspect this may be going on for longer than that.    Objective: Vital signs in last 24 hours: Filed Vitals:   02/07/15 0900 02/07/15 1123 02/07/15 1148 02/07/15 1151  BP:  120/84  118/86  Pulse:  90    Temp:  98.5 F (36.9 C)  98.6 F (37 C)  TempSrc:  Oral  Oral  Resp:  18  18  Height:   5\' 11"  (1.803 m)   Weight:      SpO2: 100% 100%  100%   Weight change:   Intake/Output Summary (Last 24 hours) at 02/07/15 1418 Last data filed at 02/07/15 1325  Gross per 24 hour  Intake 1121.67 ml  Output    250 ml  Net 871.67 ml   General: Vital signs reviewed. thin looking pt lying in bed with slow voice Cardiovascular: regular rate, rhythm, no murmur appreciated  Pulmonary/Chest: Clear to auscultation bilaterally, no wheezes, rales, or rhonchi. Abdominal: Soft, non-tender, non-distended, BS + Extremities: No lower extremity edema bilaterally, pulses symmetric and intact bilaterally. Skin: Warm, dry and intact. No rashes or erythema.   Lab Results: Results for orders placed or performed during the hospital encounter of 02/06/15 (from the past 24 hour(s))  Basic metabolic panel     Status: Abnormal   Collection Time: 02/06/15  7:24 PM  Result Value Ref Range   Sodium 139 135 - 145 mmol/L   Potassium 4.3  3.5 - 5.1 mmol/L   Chloride 108 101 - 111 mmol/L   CO2 16 (L) 22 - 32 mmol/L   Glucose, Bld 120 (H) 65 - 99 mg/dL   BUN 74 (H) 6 - 20 mg/dL   Creatinine, Ser 8.82 (H) 0.44 - 1.00 mg/dL   Calcium 7.3 (L) 8.9 - 10.3 mg/dL   GFR calc non Af Amer 5 (L) >60 mL/min   GFR calc Af Amer 6 (L) >60 mL/min   Anion gap 15 5 - 15  CBC with Differential     Status: Abnormal   Collection Time: 02/06/15  7:24 PM  Result Value Ref Range   WBC 13.1 (H) 4.0 - 10.5 K/uL   RBC 3.56 (L) 3.87 - 5.11 MIL/uL   Hemoglobin 8.3 (L) 12.0 - 15.0 g/dL   HCT 25.3 (L) 36.0 - 46.0 %   MCV 71.1 (L) 78.0 - 100.0 fL   MCH 23.3 (L) 26.0 - 34.0 pg   MCHC 32.8 30.0 - 36.0 g/dL   RDW 17.5 (H) 11.5 - 15.5 %   Platelets 237 150 - 400 K/uL   Neutrophils Relative % 78 %   Lymphocytes Relative 16 %   Monocytes Relative 6 %   Eosinophils Relative 0 %   Basophils  Relative 0 %   Neutro Abs 10.2 (H) 1.7 - 7.7 K/uL   Lymphs Abs 2.1 0.7 - 4.0 K/uL   Monocytes Absolute 0.8 0.1 - 1.0 K/uL   Eosinophils Absolute 0.0 0.0 - 0.7 K/uL   Basophils Absolute 0.0 0.0 - 0.1 K/uL   RBC Morphology POLYCHROMASIA PRESENT   I-Stat beta hCG blood, ED ([facility], [facility], [facility] only)     Status: None   Collection Time: 02/06/15  7:33 PM  Result Value Ref Range   I-stat hCG, quantitative <5.0 <5 mIU/mL   Comment 3          I-Stat CG4 Lactic Acid, ED     Status: None   Collection Time: 02/06/15  7:35 PM  Result Value Ref Range   Lactic Acid, Venous 1.83 0.5 - 2.0 mmol/L  Blood culture (routine x 2)     Status: None (Preliminary result)   Collection Time: 02/06/15  8:08 PM  Result Value Ref Range   Specimen Description BLOOD LEFT ARM    Special Requests BOTTLES DRAWN AEROBIC AND ANAEROBIC 5CC    Culture NO GROWTH < 12 HOURS    Report Status PENDING   Blood culture (routine x 2)     Status: None (Preliminary result)   Collection Time: 02/06/15  8:21 PM  Result Value Ref Range   Specimen Description BLOOD LEFT ARM    Special Requests BOTTLES DRAWN  AEROBIC AND ANAEROBIC 5CC    Culture NO GROWTH < 12 HOURS    Report Status PENDING   CBG monitoring, ED     Status: None   Collection Time: 02/06/15  8:32 PM  Result Value Ref Range   Glucose-Capillary 92 65 - 99 mg/dL  Urinalysis, Routine w reflex microscopic (not at [facility])     Status: Abnormal   Collection Time: 02/06/15 11:26 PM  Result Value Ref Range   Color, Urine YELLOW YELLOW   APPearance CLOUDY (A) CLEAR   Specific Gravity, Urine 1.017 1.005 - 1.030   pH 7.5 5.0 - 8.0   Glucose, UA 100 (A) NEGATIVE mg/dL   Hgb urine dipstick TRACE (A) NEGATIVE   Bilirubin Urine NEGATIVE NEGATIVE   Ketones, ur NEGATIVE NEGATIVE mg/dL   Protein, ur >300 (A) NEGATIVE mg/dL   Nitrite NEGATIVE NEGATIVE   Leukocytes, UA TRACE (A) NEGATIVE  Sodium, urine, random     Status: None   Collection Time: 02/06/15 11:26 PM  Result Value Ref Range   Sodium, Ur 66 mmol/L  Creatinine, urine, random     Status: None   Collection Time: 02/06/15 11:26 PM  Result Value Ref Range   Creatinine, Urine 57.72 mg/dL  Urine microscopic-add on     Status: Abnormal   Collection Time: 02/06/15 11:26 PM  Result Value Ref Range   Squamous Epithelial / LPF 6-30 (A) NONE SEEN   WBC, UA 0-5 0 - 5 WBC/hpf   RBC / HPF 0-5 0 - 5 RBC/hpf   Bacteria, UA FEW (A) NONE SEEN   Casts HYALINE CASTS (A) NEGATIVE  Comprehensive metabolic panel     Status: Abnormal   Collection Time: 02/07/15  4:43 AM  Result Value Ref Range   Sodium 137 135 - 145 mmol/L   Potassium 3.7 3.5 - 5.1 mmol/L   Chloride 112 (H) 101 - 111 mmol/L   CO2 15 (L) 22 - 32 mmol/L   Glucose, Bld 96 65 - 99 mg/dL   BUN 67 (H) 6 - 20 mg/dL   Creatinine, Ser 8.15 (  H) 0.44 - 1.00 mg/dL   Calcium 6.3 (LL) 8.9 - 10.3 mg/dL   Total Protein 6.3 (L) 6.5 - 8.1 g/dL   Albumin <1.0 (L) 3.5 - 5.0 g/dL   AST 13 (L) 15 - 41 U/L   ALT 7 (L) 14 - 54 U/L   Alkaline Phosphatase 67 38 - 126 U/L   Total Bilirubin 0.3 0.3 - 1.2 mg/dL   GFR calc non Af Amer 6 (L) >60 mL/min     GFR calc Af Amer 6 (L) >60 mL/min   Anion gap 10 5 - 15  CBC with Differential/Platelet     Status: Abnormal   Collection Time: 02/07/15  4:43 AM  Result Value Ref Range   WBC 10.7 (H) 4.0 - 10.5 K/uL   RBC 2.66 (L) 3.87 - 5.11 MIL/uL   Hemoglobin 6.1 (LL) 12.0 - 15.0 g/dL   HCT 18.9 (L) 36.0 - 46.0 %   MCV 71.1 (L) 78.0 - 100.0 fL   MCH 22.9 (L) 26.0 - 34.0 pg   MCHC 32.3 30.0 - 36.0 g/dL   RDW 17.5 (H) 11.5 - 15.5 %   Platelets 185 150 - 400 K/uL   Neutrophils Relative % 80 %   Lymphocytes Relative 9 %   Monocytes Relative 11 %   Eosinophils Relative 0 %   Basophils Relative 0 %   Neutro Abs 8.5 (H) 1.7 - 7.7 K/uL   Lymphs Abs 1.0 0.7 - 4.0 K/uL   Monocytes Absolute 1.2 (H) 0.1 - 1.0 K/uL   Eosinophils Absolute 0.0 0.0 - 0.7 K/uL   Basophils Absolute 0.0 0.0 - 0.1 K/uL   RBC Morphology POLYCHROMASIA PRESENT   Magnesium     Status: Abnormal   Collection Time: 02/07/15  4:43 AM  Result Value Ref Range   Magnesium 1.0 (L) 1.7 - 2.4 mg/dL  Prepare RBC     Status: None   Collection Time: 02/07/15  7:30 AM  Result Value Ref Range   Order Confirmation ORDER PROCESSED BY BLOOD BANK   Vitamin B12     Status: None   Collection Time: 02/07/15  9:00 AM  Result Value Ref Range   Vitamin B-12 383 180 - 914 pg/mL  Folate     Status: None   Collection Time: 02/07/15  9:00 AM  Result Value Ref Range   Folate 21.6 >5.9 ng/mL  Iron and TIBC     Status: Abnormal   Collection Time: 02/07/15  9:00 AM  Result Value Ref Range   Iron 9 (L) 28 - 170 ug/dL   TIBC 127 (L) 250 - 450 ug/dL   Saturation Ratios 7 (L) 10.4 - 31.8 %   UIBC 118 ug/dL  Ferritin     Status: None   Collection Time: 02/07/15  9:00 AM  Result Value Ref Range   Ferritin 207 11 - 307 ng/mL  Reticulocytes     Status: Abnormal   Collection Time: 02/07/15  9:00 AM  Result Value Ref Range   Retic Ct Pct 0.5 0.4 - 3.1 %   RBC. 3.01 (L) 3.87 - 5.11 MIL/uL   Retic Count, Manual 15.1 (L) 19.0 - 186.0 K/uL  Lactate  dehydrogenase     Status: None   Collection Time: 02/07/15  9:00 AM  Result Value Ref Range   LDH 150 98 - 192 U/L  Type and screen [place]     Status: None (Preliminary result)   Collection Time: 02/07/15  9:00 AM  Result  Value Ref Range   ABO/RH(D) B POS    Antibody Screen NEG    Sample Expiration 02/10/2015    Unit Number [id number]    Blood Component Type RED CELLS,LR    Unit division 00    Status of Unit REL FROM [facility]    Transfusion Status OK TO TRANSFUSE    Crossmatch Result Compatible    Unit Number [id number]    Blood Component Type RED CELLS,LR    Unit division 00    Status of Unit ISSUED    Transfusion Status OK TO TRANSFUSE    Crossmatch Result Compatible     Micro Results: Recent Results (from the past 240 hour(s))  Blood culture (routine x 2)     Status: None (Preliminary result)   Collection Time: 02/06/15  8:08 PM  Result Value Ref Range Status   Specimen Description BLOOD LEFT ARM  Final   Special Requests BOTTLES DRAWN AEROBIC AND ANAEROBIC 5CC  Final   Culture NO GROWTH < 12 HOURS  Final   Report Status PENDING  Incomplete  Blood culture (routine x 2)     Status: None (Preliminary result)   Collection Time: 02/06/15  8:21 PM  Result Value Ref Range Status   Specimen Description BLOOD LEFT ARM  Final   Special Requests BOTTLES DRAWN AEROBIC AND ANAEROBIC 5CC  Final   Culture NO GROWTH < 12 HOURS  Final   Report Status PENDING  Incomplete   Studies/Results: X-ray Chest Pa And Lateral  02/07/2015  CLINICAL DATA:  Pneumocystis carinii pneumonia.  Fever. EXAM: CHEST  2 VIEW COMPARISON:  02/06/2015 FINDINGS: The cardiac silhouette is within normal limits in size for AP technique. Ill-defined diffuse interstitial opacities are again seen with more confluent opacities in both lung bases. Right basilar opacities have increased from yesterday's study. Left basilar opacities also appear minimally worse. No sizable pleural effusion or  pneumothorax is identified. IMPRESSION: Increasing bibasilar opacities concerning for acute infection. Electronically Signed   By: Logan Bores M.D.   On: 02/07/2015 10:11   US Renal  02/07/2015  CLINICAL DATA:  Patient with acute renal failure.  History HIV. EXAM: RENAL / URINARY TRACT ULTRASOUND COMPLETE COMPARISON:  None. FINDINGS: Right Kidney: Length: 11.5 cm. Markedly increased renal cortical echogenicity. No hydronephrosis. Left Kidney: Length: 11.4 cm. Markedly increased renal cortical echogenicity. No hydronephrosis. Bladder: Appears normal for degree of bladder distention. IMPRESSION: No hydronephrosis. Markedly increased renal cortical echogenicity compatible with medical renal disease. Electronically Signed   By: Lovey Newcomer M.D.   On: 02/07/2015 08:45   Dg Chest Port 1 View  02/06/2015  CLINICAL DATA:  Weakness.  Cough.  History of HIV. EXAM: PORTABLE CHEST 1 VIEW COMPARISON:  06/28/2011. FINDINGS: There are ill-defined interstitial densities bilaterally most evident in the left lower lung. Similar densities have been present on multiple prior chest radiographs. Lungs are hyperexpanded.  No pleural effusion or pneumothorax. Cardiac silhouette is normal in size. No mediastinal or hilar masses or convincing adenopathy. Bony thorax is intact. IMPRESSION: 1. Ill-defined interstitial opacities most evident in the left lower lung. Similar densities have been present on multiple prior studies. Findings may all be chronic. However, acute interstitial infection, most notably involving the left lower lobe, should be considered likely given the patient's history. 2. No focal consolidation is seen to suggest lobar pneumonia. No pulmonary edema. Electronically Signed   By: Lajean Manes M.D.   On: 02/06/2015 20:55   Medications: I have reviewed the patient's current medications. Scheduled  Meds: . clindamycin (CLEOCIN) IV  450 mg Intravenous 4 times per day  . heparin  5,000 Units Subcutaneous 3 times per  day  . meropenem (MERREM) IV  500 mg Intravenous Q24H  . primaquine  30 mg Oral Daily  . sodium chloride  3 mL Intravenous Q12H  . valACYclovir  500 mg Oral Daily   Continuous Infusions: . 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   Sepsis 2/2 PNA: Chronically ill female with HIV/AIDS, presenting with tachycardia, 2-3 weeks cough productive of green sputum and diffuse interstitial pulmonary infiltrates on CXR most consistent with PCP PNA. However, given her severe immunosuppression, the differential remains broad, including viral, bacterial, or fungal PNA. Therefore, we will continue broad spectrum abx overnight. Chest xray today shows worsening bibasilar infiltrates.   -consulted ID- Dr. Baxter Flattery- appreciate recommendations -per ID- d/c vanc and bactrim, switch to primaquin and clindamycin for 21 days for PCP -c/w meropenem  -HOLDING ARTs int he setting of ARF -f./u CMV and EBV panel  Acute Renal Failure: Normal Cr baseline 1.5-1.6, now presenting with Cr 8.82. Etiology unclear and likely multifactorial in the setting of decreased PO intake, sepsis, and medication. However, patient reports not having taken any of her medications for > 1 week. U/A ordered in ED pending. Will provide IVF and monitor Cr. FeNa was > 1but there are other etiologies. If there are no improvement in RF, may need  biopsy.   -consulted nephrology, appreciate their recommendations  - D5 1000 mL with sodium bicarb 150 mEq infusion at 50 cc/hr  -f/u dsDNA, c3,c4, and ana antibodies   HIV/AIDS: Last CD4 count 60 in Aug 2016. Patient has long h/o medication noncompliance. Depression likely playing a role in her weakness, malnutrition, and noncompliance. Consider restarting Remeron for antidepressant and appetite effects. -per ID  - HOLDING ARTs  HSV: Patient has active lesions on gluteal cleft, but is also in acute renal failure. Will renally dose Valtrex - Valacyclovir 500 mg daily   Diarrhea: Patient reports 3 loose stools while too weak to get out of bed. Given her immunosuppression, infectious etiologies are definitely possible. Patient attributes her diarrhea to meal eaten yesterday. Will investigate stool panel and monitor.  -follow up cdiff and cryptosporidium studies   Weakness and malnutrition : Likely chronic due to deconditioning and acutely worsened in setting of infection. Patient admits she sleeps all day in bed and has severe protein calorie malnutrition. She denies weight loss or night sweats concerning for underlying malignancy. - PT/OT/Nutrition     Dispo: Disposition is deferred at this time, awaiting improvement of current medical problems.  Anticipated discharge in approximately 2 day(s).   The patient does have a current PCP Campbell Riches, MD) and does need an [facility] hospital follow-up appointment after discharge.  The patient does not have transportation limitations that hinder transportation to clinic appointments.  .Services Needed at time of discharge: Y =  Yes, Blank = No PT:   OT:   RN:   Equipment:   Other:     LOS: 1 day   Paula Estelle, MD 02/07/2015, 2:18 PM

## 2015-02-07 NOTE — Progress Notes (Signed)
CRITICAL VALUE ALERT  Critical value received:  Hgb 6.1  Date of notification:  02/07/15  Time of notification:  0643  Critical value read back:Yes.    Nurse who received alert:  Shelbie Hutching  MD notified (1st page):  Internal Medicine  Time of first page:  [phone]  MD notified (2nd page):  Time of second page:  Responding MD:  Internal Medicine MD, Merrilee Seashore  Time MD responded:  [phone]

## 2015-02-07 NOTE — Progress Notes (Addendum)
[facility] for Infectious Disease  Total days of antibiotics 2        Day 2 bactrim, vanco, mero         Reason for Consult: hiv disease   Referring Physician: narendra  Principal Problem:   Sepsis ([place]) Active Problems:   Anemia of chronic disease   Severe protein-calorie malnutrition (HCC)   HSV (herpes simplex virus) anogenital infection   Acute renal failure (HCC)   Diarrhea   AIDS (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 CD 4 count of 60/VL103 (aug 2016) on DTGV/DRVc. Followed by Dr. Johnnye Sima and last seen in clinic mid dec. She also has hx of CKD, HSV, MRSA furuncles. She is admitted for feeling poorly,loss of appetite, fatigue with weakness over the past week plus one day of diarrhea. Patient states that approx 1 week ago she noted worsening cough productive of greenish phlegm assoc with fatigue and weakness. Patient also noted loose stools 1 day prior to admission. No hematochezia/melena. She has noticed decrease urine output. Due to loss of appetite, she has not taken her hiv meds since the new year. In the ED, she is afebrile, looks chronically ill. Hemodynamically stable but found to have acute on chronic CKD with Cr of 8.8, low bicarb, wbc of 13, with 78%N, hgb 6.1, cxr shows interstitial opacities that are unchanged from prior studies, but after receiving ivf, repeat cxr does show bilateral interstitial infiltrates. Renal u/s ruled out obstruction. ID and renal consulted  Past Medical History  Diagnosis Date  . HIV (human immunodeficiency virus infection) ([place])   . MRSA (methicillin resistant staph aureus) culture positive   . Necrotizing pneumonia ([place]) 07/2010  . Pneumonia 06/23/11    RLL patchy, nodular lung disease  . Anemia of chronic disease   . History of noncompliance with medical treatment   . Herpes      Allergies:  Allergies  Allergen Reactions  . Shellfish-Derived Products Swelling    Facial swelling  . Vancomycin Rash    MEDICATIONS: . clindamycin  450 mg Oral 4 times per day  . heparin  5,000 Units Subcutaneous 3 times per day  . meropenem (MERREM) IV  500 mg Intravenous Q24H  . primaquine  30 mg Oral Daily  . sodium chloride  3 mL Intravenous Q12H  . valACYclovir  500 mg Oral Daily    Social History  Substance Use Topics  . Smoking status: Never Smoker   . Smokeless tobacco: Never Used  . Alcohol Use: No    Family History  Problem Relation Age of Onset  . Lung disease Mother     passed away at 41 with lung disease  . Hypertension Father   - brother on ESRD-HD  Review of Systems  Constitutional: Negative for fever, diaphoresis, positive for chills, activity change, appetite change, fatigue and unexpected weight change.  HENT: Negative for congestion, sore throat, rhinorrhea, sneezing, trouble swallowing and sinus pressure.  Eyes: Negative for photophobia and visual disturbance.  Respiratory: positive for cough, but negative for chest tightness, shortness of breath, wheezing and stridor.  Cardiovascular: Negative for chest pain, palpitations and leg swelling.  Gastrointestinal: Negative for nausea, vomiting, abdominal pain, diarrhea, constipation, blood in stool, abdominal distention and anal bleeding.  Genitourinary: Negative for dysuria, hematuria, flank pain and difficulty urinating.  Musculoskeletal: Negative for myalgias,  back pain, joint swelling, arthralgias and gait problem.  Skin: Negative for color change, pallor, rash and wound.  Neurological: Negative for dizziness, tremors, weakness and light-headedness.  Hematological: Negative for adenopathy. Does not bruise/bleed easily.  Psychiatric/Behavioral: Negative for behavioral problems, confusion, sleep disturbance, dysphoric mood, decreased concentration and agitation.     OBJECTIVE: Temp:  [98.5 F  (36.9 C)-100 F (37.8 C)] 98.6 F (37 C) (01/08 1151) Pulse Rate:  [53-137] 90 (01/08 1123) Resp:  [16-29] 18 (01/08 1151) BP: (111-137)/(65-92) 118/86 mmHg (01/08 1151) SpO2:  [63 %-100 %] 100 % (01/08 1151) Weight:  [123 lb 1.6 oz (55.838 kg)] 123 lb 1.6 oz (55.838 kg) (01/07 2257) Physical Exam  Constitutional:  oriented to person, place, and time. appears chronically ill. No distress.  HENT: [place]/AT, PERRLA, no scleral icterus, pale conjunctiva Mouth/Throat: Oropharynx is clear and moist. No oropharyngeal exudate.  Cardiovascular: Normal rate, regular rhythm and normal heart sounds. Exam reveals no gallop and no friction rub.  No murmur heard.  Pulmonary/Chest: Effort normal and breath sounds normal. No respiratory distress.  Expiratory wheeze on left lower lung fields Neck = supple, no nuchal rigidity Abdominal: Soft. Bowel sounds are normal.  exhibits no distension. There is no tenderness.  Lymphadenopathy: no cervical adenopathy. No axillary adenopathy Neurological: alert and oriented to person, place, and time.  Skin: Skin is warm and dry. No rash noted. No erythema.  Psychiatric: a normal mood and affect.  behavior is normal.    LABS: Results for orders placed or performed during the hospital encounter of 02/06/15 (from the past 48 hour(s))  Basic metabolic panel     Status: Abnormal   Collection Time: 02/06/15  7:24 PM  Result Value Ref Range   Sodium 139 135 - 145 mmol/L   Potassium 4.3 3.5 - 5.1 mmol/L   Chloride 108 101 - 111 mmol/L   CO2 16 (L) 22 - 32 mmol/L   Glucose, Bld 120 (H) 65 - 99 mg/dL   BUN 74 (H) 6 - 20 mg/dL   Creatinine, Ser 8.82 (H) 0.44 - 1.00 mg/dL   Calcium 7.3 (L) 8.9 - 10.3 mg/dL   GFR calc non Af Amer 5 (L) >60 mL/min   GFR calc Af Amer 6 (L) >60 mL/min    Comment: (NOTE) The eGFR has been calculated using the CKD EPI equation. This calculation has not been validated in all clinical situations. eGFR's persistently <60 mL/min signify possible  Chronic Kidney Disease.    Anion gap 15 5 - 15  CBC with Differential     Status: Abnormal   Collection Time: 02/06/15  7:24 PM  Result Value Ref Range   WBC 13.1 (H) 4.0 - 10.5 K/uL   RBC 3.56 (L) 3.87 - 5.11 MIL/uL   Hemoglobin 8.3 (L) 12.0 - 15.0 g/dL   HCT 25.3 (L) 36.0 - 46.0 %   MCV 71.1 (L) 78.0 - 100.0 fL   MCH 23.3 (L) 26.0 - 34.0 pg   MCHC 32.8 30.0 - 36.0 g/dL   RDW 17.5 (H) 11.5 - 15.5 %   Platelets 237 150 - 400 K/uL   Neutrophils Relative % 78 %   Lymphocytes Relative 16 %   Monocytes Relative 6 %   Eosinophils Relative 0 %   Basophils Relative 0 %   Neutro Abs 10.2 (H) 1.7 - 7.7 K/uL   Lymphs Abs 2.1 0.7 - 4.0 K/uL   Monocytes Absolute 0.8 0.1 - 1.0 K/uL   Eosinophils Absolute 0.0 0.0 - 0.7  K/uL   Basophils Absolute 0.0 0.0 - 0.1 K/uL   RBC Morphology POLYCHROMASIA PRESENT   I-Stat beta hCG blood, ED ([facility], [facility], [facility] only)     Status: None   Collection Time: 02/06/15  7:33 PM  Result Value Ref Range   I-stat hCG, quantitative <5.0 <5 mIU/mL   Comment 3            Comment:   GEST. AGE      CONC.  (mIU/mL)   <=1 WEEK        5 - 50     2 WEEKS       50 - 500     3 WEEKS       100 - 10,000     4 WEEKS     1,000 - 30,000        FEMALE AND NON-PREGNANT FEMALE:     LESS THAN 5 mIU/mL   I-Stat CG4 Lactic Acid, ED     Status: None   Collection Time: 02/06/15  7:35 PM  Result Value Ref Range   Lactic Acid, Venous 1.83 0.5 - 2.0 mmol/L  Blood culture (routine x 2)     Status: None (Preliminary result)   Collection Time: 02/06/15  8:08 PM  Result Value Ref Range   Specimen Description BLOOD LEFT ARM    Special Requests BOTTLES DRAWN AEROBIC AND ANAEROBIC 5CC    Culture NO GROWTH < 12 HOURS    Report Status PENDING   Blood culture (routine x 2)     Status: None (Preliminary result)   Collection Time: 02/06/15  8:21 PM  Result Value Ref Range   Specimen Description BLOOD LEFT ARM    Special Requests BOTTLES DRAWN AEROBIC AND ANAEROBIC 5CC    Culture NO GROWTH < 12  HOURS    Report Status PENDING   CBG monitoring, ED     Status: None   Collection Time: 02/06/15  8:32 PM  Result Value Ref Range   Glucose-Capillary 92 65 - 99 mg/dL  Urinalysis, Routine w reflex microscopic (not at [facility])     Status: Abnormal   Collection Time: 02/06/15 11:26 PM  Result Value Ref Range   Color, Urine YELLOW YELLOW   APPearance CLOUDY (A) CLEAR   Specific Gravity, Urine 1.017 1.005 - 1.030   pH 7.5 5.0 - 8.0   Glucose, UA 100 (A) NEGATIVE mg/dL   Hgb urine dipstick TRACE (A) NEGATIVE   Bilirubin Urine NEGATIVE NEGATIVE   Ketones, ur NEGATIVE NEGATIVE mg/dL   Protein, ur >300 (A) NEGATIVE mg/dL   Nitrite NEGATIVE NEGATIVE   Leukocytes, UA TRACE (A) NEGATIVE  Sodium, urine, random     Status: None   Collection Time: 02/06/15 11:26 PM  Result Value Ref Range   Sodium, Ur 66 mmol/L  Creatinine, urine, random     Status: None   Collection Time: 02/06/15 11:26 PM  Result Value Ref Range   Creatinine, Urine 57.72 mg/dL  Urine microscopic-add on     Status: Abnormal   Collection Time: 02/06/15 11:26 PM  Result Value Ref Range   Squamous Epithelial / LPF 6-30 (A) NONE SEEN   WBC, UA 0-5 0 - 5 WBC/hpf   RBC / HPF 0-5 0 - 5 RBC/hpf   Bacteria, UA FEW (A) NONE SEEN   Casts HYALINE CASTS (A) NEGATIVE  Comprehensive metabolic panel     Status: Abnormal   Collection Time: 02/07/15  4:43 AM  Result Value Ref Range   Sodium  137 135 - 145 mmol/L   Potassium 3.7 3.5 - 5.1 mmol/L   Chloride 112 (H) 101 - 111 mmol/L   CO2 15 (L) 22 - 32 mmol/L   Glucose, Bld 96 65 - 99 mg/dL   BUN 67 (H) 6 - 20 mg/dL   Creatinine, Ser 8.15 (H) 0.44 - 1.00 mg/dL   Calcium 6.3 (LL) 8.9 - 10.3 mg/dL    Comment: CRITICAL RESULT CALLED TO, READ BACK BY AND VERIFIED WITH: [facility] 02/07/15 0629 WAYK    Total Protein 6.3 (L) 6.5 - 8.1 g/dL   Albumin <1.0 (L) 3.5 - 5.0 g/dL    Comment: REPEATED TO VERIFY   AST 13 (L) 15 - 41 U/L   ALT 7 (L) 14 - 54 U/L   Alkaline Phosphatase 67 38 - 126  U/L   Total Bilirubin 0.3 0.3 - 1.2 mg/dL   GFR calc non Af Amer 6 (L) >60 mL/min   GFR calc Af Amer 6 (L) >60 mL/min    Comment: (NOTE) The eGFR has been calculated using the CKD EPI equation. This calculation has not been validated in all clinical situations. eGFR's persistently <60 mL/min signify possible Chronic Kidney Disease.    Anion gap 10 5 - 15  CBC with Differential/Platelet     Status: Abnormal   Collection Time: 02/07/15  4:43 AM  Result Value Ref Range   WBC 10.7 (H) 4.0 - 10.5 K/uL   RBC 2.66 (L) 3.87 - 5.11 MIL/uL   Hemoglobin 6.1 (LL) 12.0 - 15.0 g/dL    Comment: DELTA CHECK NOTED REPEATED TO VERIFY CRITICAL RESULT CALLED TO, READ BACK BY AND VERIFIED WITHAlwyn Ren AT 5956 02/07/15. K.PAXTON    HCT 18.9 (L) 36.0 - 46.0 %   MCV 71.1 (L) 78.0 - 100.0 fL   MCH 22.9 (L) 26.0 - 34.0 pg   MCHC 32.3 30.0 - 36.0 g/dL   RDW 17.5 (H) 11.5 - 15.5 %   Platelets 185 150 - 400 K/uL   Neutrophils Relative % 80 %   Lymphocytes Relative 9 %   Monocytes Relative 11 %   Eosinophils Relative 0 %   Basophils Relative 0 %   Neutro Abs 8.5 (H) 1.7 - 7.7 K/uL   Lymphs Abs 1.0 0.7 - 4.0 K/uL   Monocytes Absolute 1.2 (H) 0.1 - 1.0 K/uL   Eosinophils Absolute 0.0 0.0 - 0.7 K/uL   Basophils Absolute 0.0 0.0 - 0.1 K/uL   RBC Morphology POLYCHROMASIA PRESENT   Magnesium     Status: Abnormal   Collection Time: 02/07/15  4:43 AM  Result Value Ref Range   Magnesium 1.0 (L) 1.7 - 2.4 mg/dL  Prepare RBC     Status: None   Collection Time: 02/07/15  7:30 AM  Result Value Ref Range   Order Confirmation ORDER PROCESSED BY BLOOD BANK   Vitamin B12     Status: None   Collection Time: 02/07/15  9:00 AM  Result Value Ref Range   Vitamin B-12 383 180 - 914 pg/mL    Comment: (NOTE) This assay is not validated for testing neonatal or myeloproliferative syndrome specimens for Vitamin B12 levels.   Folate     Status: None   Collection Time: 02/07/15  9:00 AM  Result Value Ref Range     Folate 21.6 >5.9 ng/mL  Iron and TIBC     Status: Abnormal   Collection Time: 02/07/15  9:00 AM  Result Value Ref Range   Iron 9 (  L) 28 - 170 ug/dL   TIBC 127 (L) 250 - 450 ug/dL   Saturation Ratios 7 (L) 10.4 - 31.8 %   UIBC 118 ug/dL  Ferritin     Status: None   Collection Time: 02/07/15  9:00 AM  Result Value Ref Range   Ferritin 207 11 - 307 ng/mL  Reticulocytes     Status: Abnormal   Collection Time: 02/07/15  9:00 AM  Result Value Ref Range   Retic Ct Pct 0.5 0.4 - 3.1 %   RBC. 3.01 (L) 3.87 - 5.11 MIL/uL   Retic Count, Manual 15.1 (L) 19.0 - 186.0 K/uL  Lactate dehydrogenase     Status: None   Collection Time: 02/07/15  9:00 AM  Result Value Ref Range   LDH 150 98 - 192 U/L  Type and screen [place]     Status: None (Preliminary result)   Collection Time: 02/07/15  9:00 AM  Result Value Ref Range   ABO/RH(D) B POS    Antibody Screen NEG    Sample Expiration 02/10/2015    Unit Number [id number]    Blood Component Type RED CELLS,LR    Unit division 00    Status of Unit REL FROM [facility]    Transfusion Status OK TO TRANSFUSE    Crossmatch Result Compatible    Unit Number [id number]    Blood Component Type RED CELLS,LR    Unit division 00    Status of Unit ISSUED    Transfusion Status OK TO TRANSFUSE    Crossmatch Result Compatible     MICRO: Blood cx 1/7 pending IMAGING: X-ray Chest Pa And Lateral  02/07/2015  CLINICAL DATA:  Pneumocystis carinii pneumonia.  Fever. EXAM: CHEST  2 VIEW COMPARISON:  02/06/2015 FINDINGS: The cardiac silhouette is within normal limits in size for AP technique. Ill-defined diffuse interstitial opacities are again seen with more confluent opacities in both lung bases. Right basilar opacities have increased from yesterday's study. Left basilar opacities also appear minimally worse. No sizable pleural effusion or pneumothorax is identified. IMPRESSION: Increasing bibasilar opacities concerning for acute infection.  Electronically Signed   By: Logan Bores M.D.   On: 02/07/2015 10:11   US Renal  02/07/2015  CLINICAL DATA:  Patient with acute renal failure.  History HIV. EXAM: RENAL / URINARY TRACT ULTRASOUND COMPLETE COMPARISON:  None. FINDINGS: Right Kidney: Length: 11.5 cm. Markedly increased renal cortical echogenicity. No hydronephrosis. Left Kidney: Length: 11.4 cm. Markedly increased renal cortical echogenicity. No hydronephrosis. Bladder: Appears normal for degree of bladder distention. IMPRESSION: No hydronephrosis. Markedly increased renal cortical echogenicity compatible with medical renal disease. Electronically Signed   By: Lovey Newcomer M.D.   On: 02/07/2015 08:45   Dg Chest Port 1 View  02/06/2015  CLINICAL DATA:  Weakness.  Cough.  History of HIV. EXAM: PORTABLE CHEST 1 VIEW COMPARISON:  06/28/2011. FINDINGS: There are ill-defined interstitial densities bilaterally most evident in the left lower lung. Similar densities have been present on multiple prior chest radiographs. Lungs are hyperexpanded.  No pleural effusion or pneumothorax. Cardiac silhouette is normal in size. No mediastinal or hilar masses or convincing adenopathy. Bony thorax is intact. IMPRESSION: 1. Ill-defined interstitial opacities most evident in the left lower lung. Similar densities have been present on multiple prior studies. Findings may all be chronic. However, acute interstitial infection, most notably involving the left lower lobe, should be considered likely given the patient's history. 2. No focal consolidation is seen to suggest lobar pneumonia. No  pulmonary edema. Electronically Signed   By: Lajean Manes M.D.   On: 02/06/2015 20:55    Assessment/Plan:  40yo F with advanced hiv disease, poorly controlled, presents with one week of malaise, productive cough, decreased urine output found to have Cr 8.8, anemia with hgb 6.1, cxr with interstitial infiltrates  Acute on chronic kidney injury = has had proteinuria in the past though  now has marked change in cr. Not c/w completely with being totally pre-renal though she looks volume depleted on exam. Only agent that could affect her kidneys would be cobi portion of prezcobix. No recent uses of NSAIDs. Renal u/s show medical renal disease. Nephrology conducting numerous tests to determine cause of A-CKD. May need biopsy/ +/- HD if she worsens  metab acidosis = possilby due to renal failure plus poor po intake and a days of diarrhea)( though only reports 2 loose stools). May need bicarb supplementation  Pneumonia = cxr suggestive of pcp. Will check pjp. Continue on clindamycin and primaquine. Avoid bactrim in setting of her AKI. Will d/c vanco and meropenem. Place on ceftriaxone for cap coverange  Poorly controlled HIV disease= likely has not taken hiv meds > 1 week. Will hold off reinitiating today since she is complaining of poor appetite. Will start her once DLG, DRV plus ritonavir once she has good po intake. She was previously on AZT but was switched off this past summer. Await to see if renal function goes back to baseline.   Moderate caloric-protein malnutrition= albumin 1 previously 3.6 in Aug 2016. Will monitor her intake to see if she need stimulant such as marinol or megace  oi proph = will check g6pd deficiency to see if can do dapsone for proph. Will do weekly fluconazole plus weekly azithromycin  Anemia = getting blood tsf presently. Watch for acute blood loss  Diarrhea = crypto/gc pending. Continue to monitor, replete fluid losses. Only has had 1 loose stool this am.  Dr. Megan Salon to provide further [place]. [place] for Infectious Diseases [phone]

## 2015-02-07 NOTE — Progress Notes (Signed)
PT Cancellation Note  Patient Details Name: Paula Becker MRN: [id number] DOB: [birth year]   Cancelled Treatment:    Reason Eval/Treat Not Completed: Patient declined, no reason specified - initially agreeable to PT eval, then asked for bed pan, set up bedside commode, pt then refused to get out of bed because "I'm too tired".  Asked pt if she wanted to participate with PT in order to improve strength and move around more easily and she nodded indicating "yes"; explained that with PT she would need to participate even when she felt tired.  Will reattempt mobility evaluation next date, however pt must be willing to move with PT.  Verbally updated RN.  Thank you,    Herbie Drape 02/07/2015, 1:47 PM

## 2015-02-07 NOTE — Progress Notes (Signed)
Utilization review completed.  

## 2015-02-07 NOTE — Progress Notes (Signed)
CRITICAL VALUE ALERT  Critical value received:  Ca 6.3  Date of notification:  02/07/15  Time of notification:  0629  Critical value read back:Yes.    Nurse who received alert:  Shelbie Hutching  MD notified (1st page):  Internal medicine   Time of first page:  0630  MD notified (2nd page):  Time of second page:  Responding MD:  Internal Medicine MD, Merrilee Seashore  Time MD responded:  [phone]

## 2015-02-07 NOTE — Consult Note (Signed)
[facility] Consultation Note Requesting Physician: Dr. Lovena Le Reason for Consult:  Renal failure in patient with HIV  HPI: The patient is a 40 y.o. year-old with uncontrolled HIV (CD4 60 08/2014; multidrug resistance d/t poor adherence), recurrent MRSA furuncles, HSV.  On the present occasion admitted with progressive weakness, fatigue, productive cough, loose stools.. Has not been taking her HIV meds recently. CXR shows worsening pulm infiltrates. She is severely anemic requiring transfusion.   We are asked to see because of renal failure. Has had known CKD as far back as 2014,  Creatinine was 1.66 08/2014. UA's have shown proteinuria by dipstick as far back as 2012 - never quantitated that I can tell.  Creatinine on admission was 8.82  - admission UA >300 mg%, 0-5R, 0-5W.  Kidneys are echodense and measure 11.5, 11.4 cm.    She does not regularly use NSAIDs. (has taken a dose of advil "once in a blue moon"). No ACE or ARB. No tenofavir containing regimens for her HIV. She has a brother on dialysis in [place] but is not aware of the cause of his renal failure.   Has noted a reduction in appetite and some early morning nausea for about a week, urine output has dropped off in the past week. No hematuria, tea or coca cola colored urine and no foaminess to her urine.  Says she cannot swallow pills.   CREATININE TRENDING FOR REFERENCE PRIOR TO ADMISSION  Date/Time Value Ref Range Status  02/07/15 8.15, alb <1, CO2 15, Hb 6.1    02/06/15 8.82    09/30/2014 02:16 PM 1.66* 0.50 - 1.10 mg/dL Final  10/08/2012 10:58 AM 1.44* 0.50 - 1.10 mg/dL Final  06/11/2012 09:54 AM 1.47* 0.50 - 1.10 mg/dL Final  01/02/2012 11:38 AM 1.08 0.50 - 1.10 mg/dL Final  09/12/2011 12:20 PM 0.74 0.50 - 1.10 mg/dL Final  02/17/2011 12:54 PM 0.81 0.50 - 1.10 mg/dL Final  09/12/2010 03:39 PM 0.79 0.50 - 1.10 mg/dL Final  04/15/2010 12:06 PM 0.66 0.40 - 1.20 mg/dL Final     Past Medical History  Diagnosis Date  . HIV (human  immunodeficiency virus infection) ([place])   . MRSA (methicillin resistant staph aureus) culture positive   . Necrotizing pneumonia ([place]) 07/2010  . Pneumonia 06/23/11    RLL patchy, nodular lung disease  . Anemia of chronic disease   . History of noncompliance with medical treatment   . Herpes       Past Surgical History  Procedure Laterality Date  . Cesarean section  1998  . Breast surgery  03/2010    right; "don't know what they did"  . Video bronchoscopy  06/28/2011    Procedure: VIDEO BRONCHOSCOPY WITH FLUORO;  Surgeon: Chesley Mires, MD;  Location: [facility];  Service: Cardiopulmonary;  Laterality: Bilateral;     Family History  Problem Relation Age of Onset  . Lung disease Mother     passed away at 14 with lung disease  . Hypertension Father    Social History:  reports that she has never smoked. She has never used smokeless tobacco. She reports that she does not drink alcohol or use illicit drugs.  Allergies:  Allergies  Allergen Reactions  . Shellfish-Derived Products Swelling    Facial swelling  . Vancomycin Rash    Home medications: Prior to Admission medications   Medication Sig Start Date End Date Taking? Authorizing Provider  acyclovir (ZOVIRAX) 400 MG tablet Take 1 tablet (400 mg total) by mouth 3 (three) times daily. 01/18/15  Campbell Riches, MD  acyclovir ointment (ZOVIRAX) 5 % Apply 1 application topically every 3 (three) hours. Patient not taking: Reported on 01/18/2015 10/08/14   Campbell Riches, MD  darunavir-cobicistat (PREZCOBIX) 800-150 MG tablet Take 1 tablet by mouth daily. Swallow whole. Do NOT crush, break or chew tablets. Take with food. Patient not taking: Reported on 01/18/2015 11/30/14   Campbell Riches, MD  dolutegravir (TIVICAY) 50 MG tablet Take 1 tablet (50 mg total) by mouth daily. Patient not taking: Reported on 01/18/2015 11/30/14   Campbell Riches, MD  gabapentin (NEURONTIN) 300 MG capsule TAKE 1 CAPSULE BY MOUTH THREE TIMES  DAILY Patient not taking: Reported on 01/18/2015 12/28/14   Campbell Riches, MD  hydrOXYzine (ATARAX/VISTARIL) 25 MG tablet Take 1 tablet (25 mg total) by mouth every 6 (six) hours. Patient not taking: Reported on 01/18/2015 11/27/14   Ashley Murrain, NP  ibuprofen (ADVIL,MOTRIN) 800 MG tablet Take 1 tablet (800 mg total) by mouth 3 (three) times daily. Patient not taking: Reported on 01/18/2015 10/12/14   Jarrett Soho Muthersbaugh, PA-C  metroNIDAZOLE (FLAGYL) 500 MG tablet Take 1 tablet (500 mg total) by mouth 2 (two) times daily. Patient not taking: Reported on 01/18/2015 11/01/14   Tatyana Kirichenko, PA-C  mirtazapine (REMERON) 15 MG tablet Take 1 tablet (15 mg total) by mouth at bedtime. 10/12/11 11/11/11  Michel Bickers, MD  oxyCODONE (ROXICODONE) 5 MG immediate release tablet Take 1 tablet (5 mg total) by mouth every 4 (four) hours as needed for severe pain. Patient not taking: Reported on 01/18/2015 11/27/14   Ashley Murrain, NP  valACYclovir (VALTREX) 1000 MG tablet TAKE 1 TABLET BY MOUTH TWICE DAILY Patient not taking: Reported on 02/06/2015 02/02/15   Campbell Riches, MD    Inpatient medications: . clindamycin  450 mg Oral 4 times per day  . heparin  5,000 Units Subcutaneous 3 times per day  . meropenem (MERREM) IV  500 mg Intravenous Q24H  . primaquine  30 mg Oral Daily  . sodium chloride  3 mL Intravenous Q12H  . valACYclovir  500 mg Oral Daily     Review of Systems +  Weight loss (she is not sure how much) + cough, sputum production but no hemoptysis + SOB + loose stools for 1 day No edema, foamy/tea or coca cola colored urine but UOP less past week Poor po intake due to nausea, and loss of appetite    Physical Exam:  BP 118/86 mmHg  Pulse 90  Temp(Src) 98.6 F (37 C) (Oral)  Resp 18  Ht 5\' 11"  (1.803 m)  Wt 55.838 kg (123 lb 1.6 oz)  BMI 17.18 kg/m2  SpO2 100%  LMP 11/16/2014  Gen: Soft spoken, chronically ill appearing AAF. Currently receiving transfusion.  Intermittently coughing Skin: Dry and flaky, Scars from old prior skins lesions primarily on her back but no acute looking lesions Neck: no JVD Chest:Coarse crackles bilaterally. No wheezing. Heart: Tachy low 90's S1S2 No S3 Active precordium. 1/6 murmur LSB. No diastolic murmur or rub Abdomen: soft, + BS. Mild diffuse tenderness Ext: No edema whatsoever Neuro: alert, Ox3, no focal deficit  Labs: Basic Metabolic Panel:  Recent Labs Lab 02/06/15 1924 02/07/15 0443  NA 139 137  K 4.3 3.7  CL 108 112*  CO2 16* 15*  GLUCOSE 120* 96  BUN 74* 67*  CREATININE 8.82* 8.15*  CALCIUM 7.3* 6.3*     Recent Labs Lab 02/07/15 0443  AST 13*  ALT 7*  ALKPHOS  59  BILITOT 0.3  PROT 6.3*  ALBUMIN <1.0*    CBC:  Recent Labs Lab 02/06/15 1924 02/07/15 0443  WBC 13.1* 10.7*  NEUTROABS 10.2* 8.5*  HGB 8.3* 6.1*  HCT 25.3* 18.9*  MCV 71.1* 71.1*  PLT 237 185   CBG:  Recent Labs Lab 02/06/15 2032  GLUCAP 92    Iron Studies:  Recent Labs Lab 02/07/15 0900  IRON 9*  TIBC 127*  FERRITIN 207    Xrays/Other Studies: X-ray Chest Pa And Lateral  02/07/2015  CLINICAL DATA:  Pneumocystis carinii pneumonia.  Fever. EXAM: CHEST  2 VIEW COMPARISON:  02/06/2015 FINDINGS: The cardiac silhouette is within normal limits in size for AP technique. Ill-defined diffuse interstitial opacities are again seen with more confluent opacities in both lung bases. Right basilar opacities have increased from yesterday's study. Left basilar opacities also appear minimally worse. No sizable pleural effusion or pneumothorax is identified. IMPRESSION: Increasing bibasilar opacities concerning for acute infection. Electronically Signed   By: Logan Bores M.D.   On: 02/07/2015 10:11   US Renal  02/07/2015  CLINICAL DATA:  Patient with acute renal failure.  History HIV. EXAM: RENAL / URINARY TRACT ULTRASOUND COMPLETE COMPARISON:  None. FINDINGS: Right Kidney: Length: 11.5 cm. Markedly increased renal  cortical echogenicity. No hydronephrosis. Left Kidney: Length: 11.4 cm. Markedly increased renal cortical echogenicity. No hydronephrosis. Bladder: Appears normal for degree of bladder distention. IMPRESSION: No hydronephrosis. Markedly increased renal cortical echogenicity compatible with medical renal disease. Electronically Signed   By: Lovey Newcomer M.D.   On: 02/07/2015 08:45   Dg Chest Port 1 View  02/06/2015  CLINICAL DATA:  Weakness.  Cough.  History of HIV. EXAM: PORTABLE CHEST 1 VIEW COMPARISON:  06/28/2011. FINDINGS: There are ill-defined interstitial densities bilaterally most evident in the left lower lung. Similar densities have been present on multiple prior chest radiographs. Lungs are hyperexpanded.  No pleural effusion or pneumothorax. Cardiac silhouette is normal in size. No mediastinal or hilar masses or convincing adenopathy. Bony thorax is intact. IMPRESSION: 1. Ill-defined interstitial opacities most evident in the left lower lung. Similar densities have been present on multiple prior studies. Findings may all be chronic. However, acute interstitial infection, most notably involving the left lower lobe, should be considered likely given the patient's history. 2. No focal consolidation is seen to suggest lobar pneumonia. No pulmonary edema. Electronically Signed   By: Lajean Manes M.D.   On: 02/06/2015 20:55   Background 40 y.o. year-old with uncontrolled HIV (CD4 60 08/2014; multidrug resistance d/t poor adherence), recurrent MRSA furuncles, HSV. Admitted with progressive weakness, fatigue, productive cough, loose stools.. Has not been taking her HIV meds recently. CXR shows worsening pulm infiltrates. She is severely anemic requiring transfusion. Has had known CKD as far back as 2014,  Creatinine was 1.66 08/2014. UA's have shown proteinuria by dipstick as far back as 2012 - never quantitated that I can tell.  Creatinine on admission was 8.82  - admission UA >300 mg%, 0-5R, 0-5W.  Kidneys  are echodense and measure 11.5, 11.4 cm. We were asked to see because of renal failure.  She does not regularly use NSAIDs. (has taken a dose of advil "once in a blue moon"). No ACE or ARB. No tenofavir containing regimens for her HIV. She has a brother on dialysis in [place] but is not aware of the cause of his renal failure.   Impression/Plan  1. AKI on CKD - obviously broad differential for her renal failure and  not known if all acute vs subacute (last creatinine was in 08/2014). Possibilities include  HIVAN (collapsing FSG), classic non-collapsing FSG, HIVICK (HIV associated immune complex ds), membranous, MPGN, TMA, etc etc. May be some acuity related to acute infection and volume depletion (does look dry on exam). Not on bactrim (atleast not as outpt PTA) or tenofavir containing regimens, and no NSAIDS. Most recently on valtrex, but prior on acyclovir (just not clear that she was taking that either). Will quantitate proteinuria, obtain complements, ANA, ANCA, serologic studies, etc - but renal biopsy would be the only way to determine etiology for certain, if she does not improve with some volume repletion, and additionally she may require dialysis but no indications right now.  2. Metabolic acidosis - low bicarb, normal gap - renal failure + diarrhea. With problems swallowing pills, will give some fluid as isotonic bicarb, then replete orally when able to reliably take po 3. Profound anemia - with extremely low transferrin saturation. Avoid IV iron until infection issues sorted out. Currently undergoing transfusion. R/O blood loss.  4. 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 Takes meds inconsistently. Dr. Baxter Flattery evaluating 5. Pulmonary infiltrates - ATB's per ID 6. Diarrhea -  7. HSV- on renal adjusted valtrex  Jamal Maes,  MD [place] [phone] pager 02/07/2015, 12:49 PM
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
## 2015-02-07 NOTE — Progress Notes (Signed)
Patient stated her arm hurts and is refusing to be stuck anymore today from lab.  MD notified.

## 2015-02-08 DIAGNOSIS — Z21 Asymptomatic human immunodeficiency virus [HIV] infection status: Secondary | ICD-10-CM

## 2015-02-08 DIAGNOSIS — J189 Pneumonia, unspecified organism: Secondary | ICD-10-CM | POA: Insufficient documentation

## 2015-02-08 DIAGNOSIS — E872 Acidosis: Secondary | ICD-10-CM

## 2015-02-08 DIAGNOSIS — Z9889 Other specified postprocedural states: Secondary | ICD-10-CM

## 2015-02-08 DIAGNOSIS — D62 Acute posthemorrhagic anemia: Secondary | ICD-10-CM

## 2015-02-08 LAB — TYPE AND SCREEN
ABO/RH(D): B POS
Antibody Screen: NEGATIVE
UNIT DIVISION: 0
UNIT DIVISION: 0

## 2015-02-08 LAB — RENAL FUNCTION PANEL
ALBUMIN: 1.1 g/dL — AB (ref 3.5–5.0)
ANION GAP: 9 (ref 5–15)
BUN: 58 mg/dL — ABNORMAL HIGH (ref 6–20)
CO2: 21 mmol/L — ABNORMAL LOW (ref 22–32)
Calcium: 7.2 mg/dL — ABNORMAL LOW (ref 8.9–10.3)
Chloride: 109 mmol/L (ref 101–111)
Creatinine, Ser: 7.82 mg/dL — ABNORMAL HIGH (ref 0.44–1.00)
GFR calc Af Amer: 7 mL/min — ABNORMAL LOW (ref >60)
GFR calc non Af Amer: 6 mL/min — ABNORMAL LOW (ref >60)
GLUCOSE: 107 mg/dL — AB (ref 65–99)
PHOSPHORUS: 5.8 mg/dL — AB (ref 2.5–4.6)
POTASSIUM: 3.6 mmol/L (ref 3.5–5.1)
Sodium: 139 mmol/L (ref 135–145)

## 2015-02-08 LAB — GASTROINTESTINAL PANEL BY PCR, STOOL (REPLACES STOOL CULTURE)
ADENOVIRUS F40/41: NOT DETECTED
ASTROVIRUS: NOT DETECTED
CAMPYLOBACTER SPECIES: NOT DETECTED
Cryptosporidium: NOT DETECTED
Cyclospora cayetanensis: NOT DETECTED
E. coli O157: NOT DETECTED
ENTEROAGGREGATIVE E COLI (EAEC): NOT DETECTED
ENTEROPATHOGENIC E COLI (EPEC): NOT DETECTED
ENTEROTOXIGENIC E COLI (ETEC): NOT DETECTED
Entamoeba histolytica: NOT DETECTED
GIARDIA LAMBLIA: NOT DETECTED
NOROVIRUS GI/GII: NOT DETECTED
PLESIMONAS SHIGELLOIDES: NOT DETECTED
Rotavirus A: NOT DETECTED
SALMONELLA SPECIES: NOT DETECTED
SHIGELLA/ENTEROINVASIVE E COLI (EIEC): NOT DETECTED
Sapovirus (I, II, IV, and V): NOT DETECTED
Shiga like toxin producing E coli (STEC): NOT DETECTED
Vibrio cholerae: NOT DETECTED
Vibrio species: NOT DETECTED
Yersinia enterocolitica: NOT DETECTED

## 2015-02-08 LAB — T-HELPER CELLS (CD4) COUNT (NOT AT ARMC)
CD4 % Helper T Cell: 3 % — ABNORMAL LOW (ref 33–55)
CD4 T CELL ABS: 40 /uL — AB (ref 400–2700)

## 2015-02-08 LAB — HIV-1 RNA QUANT-NO REFLEX-BLD
HIV 1 RNA Quant: 20700 copies/mL
LOG10 HIV-1 RNA: 4.316 {Log_copies}/mL

## 2015-02-08 LAB — CBC
HEMATOCRIT: 24.7 % — AB (ref 36.0–46.0)
HEMOGLOBIN: 8.1 g/dL — AB (ref 12.0–15.0)
MCH: 24.3 pg — ABNORMAL LOW (ref 26.0–34.0)
MCHC: 32.8 g/dL (ref 30.0–36.0)
MCV: 74 fL — ABNORMAL LOW (ref 78.0–100.0)
Platelets: 228 10*3/uL (ref 150–400)
RBC: 3.34 MIL/uL — ABNORMAL LOW (ref 3.87–5.11)
RDW: 18.9 % — ABNORMAL HIGH (ref 11.5–15.5)
WBC: 7.5 10*3/uL (ref 4.0–10.5)

## 2015-02-08 LAB — LEGIONELLA ANTIGEN, URINE

## 2015-02-08 LAB — CMV ANTIBODY, IGG (EIA)

## 2015-02-08 LAB — HAPTOGLOBIN: Haptoglobin: 303 mg/dL — ABNORMAL HIGH (ref 34–200)

## 2015-02-08 LAB — CMV IGM: CMV IgM: <30 AU/mL (ref 0.0–29.9)

## 2015-02-08 MED ORDER — CLINDAMYCIN PHOSPHATE 600 MG/50ML IV SOLN
600.0000 mg | Freq: Four times a day (QID) | INTRAVENOUS | Status: DC
  Administered 2015-02-08: 600 mg via INTRAVENOUS
  Filled 2015-02-08: qty 50

## 2015-02-08 MED ORDER — SODIUM CHLORIDE 0.9 % IV SOLN
INTRAVENOUS | Status: DC
  Administered 2015-02-08: 11:00:00 via INTRAVENOUS

## 2015-02-08 MED ORDER — MEGESTROL ACETATE 40 MG/ML PO SUSP
400.0000 mg | Freq: Every day | ORAL | Status: DC
  Administered 2015-02-09 – 2015-02-10 (×2): 400 mg via ORAL
  Filled 2015-02-08 (×4): qty 10

## 2015-02-08 MED ORDER — NEPRO/CARBSTEADY PO LIQD
237.0000 mL | Freq: Two times a day (BID) | ORAL | Status: DC
  Administered 2015-02-08 – 2015-02-09 (×3): 237 mL via ORAL

## 2015-02-08 NOTE — Progress Notes (Signed)
Initial Nutrition Assessment  DOCUMENTATION CODES:   Non-severe (moderate) malnutrition in context of chronic illness, Underweight  INTERVENTION:  Provide Nepro Shake po BID, each supplement provides 425 kcal and 19 grams protein.  Encourage adequate PO intake.   NUTRITION DIAGNOSIS:   Malnutrition related to chronic illness as evidenced by moderate depletions of muscle mass, energy intake < or equal to 75% for > or equal to 1 month.  GOAL:   Patient will meet greater than or equal to 90% of their needs  MONITOR:   PO intake, Supplement acceptance, Weight trends, Labs, I & O's  REASON FOR ASSESSMENT:   Consult Assessment of nutrition requirement/status (Poor po)  ASSESSMENT:   41 yo female with HIV (last CD4 count 60 in Aug 2016), HSV, and recurrent MRSA furuncles, presenting with generalized weakness and diarrhea.   Meal completion has been varied from 0-85%. Pt reports having a decreased appetite. Pt reports she usually consumes 1 meal a day. Usual body weight reported to be ~123 lbs. Pt with no significant weight loss. RD to order nutritional supplements to aid in caloric and protein needs. Pt was encourage to eat her food at meals.   Limited nutrition-Focused physical exam completed. Findings are moderate  muscle depletion, and no edema. Of note, unable to observe the upper arm region or thoracic and lumbar region during time of visit.  Phosphorous elevated at 5.8.  Diet Order:  DIET SOFT Room service appropriate?: Yes; Fluid consistency:: Thin  Skin:  Reviewed, no issues  Last BM:  1/9  Height:   Ht Readings from Last 1 Encounters:  02/07/15 5\' 11"  (1.803 m)    Weight:   Wt Readings from Last 1 Encounters:  02/07/15 127 lb (57.607 kg)    Ideal Body Weight:  70.45 kg  BMI:  Body mass index is 17.72 kg/(m^2).  Estimated Nutritional Needs:   Kcal:  1800-2000  Protein:  85-100 grams  Fluid:  1.8 -2 L/day  EDUCATION NEEDS:   No education needs  identified at this time  Corrin Parker, MS, RD, LDN Pager # [phone] After hours/ weekend pager # [phone]

## 2015-02-08 NOTE — Progress Notes (Signed)
Patient ID: Paula Becker, female   DOB: October 10, 1975, 40 y.o.   MRN: [id number] Placing progress note to notate the attempts made to see patient for [name] Nurse services. Several attempts have been made to see the patient today but after rescheduling several times the patient agreed to visit on this Thursday(12/22) at the clinic([facility])

## 2015-02-08 NOTE — Progress Notes (Signed)
Patient seen and examined. Case d/w residents in detail. I agree with findings and plan as documented in Dr. Sherlynn Carbon note.  Patient was admitted for progressive weakness and cough with assoc diarrhea likely secondary to underlying PNA -likely strep PNA (+ Ag). She was also found to have AKI with Cr in the 8s and a non gap metabolic acidosis secondary to AKI and diarrhea.  ID f/u and recommendations appreciated - c/w ceftriaxone for strep pneumo coverage. C/w clinda and primaquin for possible PCP. Patient will need weekly azithromycin and fluconazole for prophylaxis given CD 4 count of 40. CMV IgM negative. Will hold ART secondary to AKI and poor PO intake.  AKI mildly improved with IVF but given no significant improvement with IV hydration may need renal biopsy to determine etiology. nephro f/u and recommendations appreciated. C/w IV hydration for now and f/u. Will f/u complement levels, ANCA, Ds DNA, ANA, Hep C ab.  Diarrhea is slowly improving. Stool PCR is negative. Will f/u c diff  Patient with symptomatic microcytic anemia now 8.1 post transfusion. Will monitor. Ferritin level is normal. Possibly anemia of chronic disease exacerbated by AKI. Will monitor CBC.

## 2015-02-08 NOTE — Progress Notes (Signed)
[facility] Progress Note    Assessment/ Plan:   40 y.o. year-old with uncontrolled HIV (CD4 60 08/2014; multidrug resistance d/t poor adherence), recurrent MRSA furuncles, HSV. Admitted with progressive weakness, fatigue, productive cough, loose stools.. Has not been taking her HIV meds recently. CXR shows worsening pulm infiltrates. She is severely anemic requiring transfusion. Has had known CKD as far back as 2014, Creatinine was 1.66 08/2014. UA's have shown proteinuria by dipstick as far back as 2012 - never quantitated that I can tell. Creatinine on admission was 8.82 - admission UA >300 mg%, 0-5R, 0-5W. Kidneys are echodense and measure 11.5, 11.4 cm. We were asked to see because of renal failure. She does not regularly use NSAIDs. (has taken a dose of advil "once in a blue moon"). No ACE or ARB. No tenofavir containing regimens for her HIV. She has a brother on dialysis in [place] but is not aware of the cause of his renal failure.   Impression/Plan  1. AKI on CKD - obviously broad differential for her renal failure and not known if all acute vs subacute (last creatinine was in 08/2014). Possibilities include HIVAN (collapsing FSG), classic non-collapsing FSG, HIVICK (HIV associated immune complex ds), membranous, MPGN, TMA, etc etc. May be some acuity related to acute infection and volume depletion (does look dry on exam). Not on bactrim (atleast not as outpt PTA) or tenofavir containing regimens, and no NSAIDS. Most recently on valtrex, but prior on acyclovir (just not clear that she was taking that either).  - Will quantitate proteinuria --> 30mg /mg which is very significant. - Complements, ANA, ANCA, serologic studies pending. - Renal biopsy would be the only way to determine etiology for certain. - She was refusing a biopsy initially but became more amenable after further discussion --> ordering a renal panel and f she does not improve with volume repletion then  moving towards a biopsy. - No absolute inidications for dialysis right now.  - Will also give additional isotolic fluid NS @75ml /hr [id number]. 2. Metabolic acidosis - low bicarb, normal gap - renal failure + diarrhea. With problems swallowing pills, will give some fluid as isotonic bicarb, then replete orally when able to reliably take po 3. Profound anemia - with extremely low transferrin saturation. Avoid IV iron until infection issues sorted out. Currently undergoing transfusion. R/O blood loss.  4. 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 Takes meds inconsistently. Dr. Baxter Flattery evaluating 5. Pulmonary infiltrates - ATB's per ID 6. Diarrhea -  7. HSV- on renal adjusted valtrex  Subjective:   She has a nonproductive cough intermittently. Doesn't feel any worse. Denies any dsypnea, nausea, vomiting. Only one loose small BM over night. Tolerating PO's. No other complaints.   Objective:   BP 135/82 mmHg  Pulse 89  Temp(Src) 98.6 F (37 C) (Oral)  Resp 18  Ht 5\' 11"  (1.803 m)  Wt 57.607 kg (127 lb)  BMI 17.72 kg/m2  SpO2 99%  LMP 11/16/2014  Intake/Output Summary (Last 24 hours) at 02/08/15 1005 Last data filed at 02/08/15 0500  Gross per 24 hour  Intake 835.83 ml  Output    750 ml  Net  85.83 ml   Weight change: 1.769 kg (3 lb 14.4 oz)  Physical Exam: Gen: Soft spoken, chronically ill appearing AAF. Appears comfortable Skin: Dry and flaky, Scars from old prior skins lesions primarily on her back but no acute looking lesions Neck: no JVD Chest:Coarse crackles bilaterally. No wheezing. Heart: Tachy low 90's S1S2 No S3 Active precordium.  1/6 murmur LSB. No diastolic murmur or rub Abdomen: soft, + BS. Mild diffuse tenderness Ext: No edema whatsoever Neuro: alert, Ox3, no focal deficit  Imaging: X-ray Chest Pa And Lateral  02/07/2015  CLINICAL DATA:  Pneumocystis carinii pneumonia.  Fever. EXAM: CHEST  2 VIEW COMPARISON:  02/06/2015 FINDINGS: The cardiac silhouette is within normal limits in  size for AP technique. Ill-defined diffuse interstitial opacities are again seen with more confluent opacities in both lung bases. Right basilar opacities have increased from yesterday's study. Left basilar opacities also appear minimally worse. No sizable pleural effusion or pneumothorax is identified. IMPRESSION: Increasing bibasilar opacities concerning for acute infection. Electronically Signed   By: Logan Bores M.D.   On: 02/07/2015 10:11   US Renal  02/07/2015  CLINICAL DATA:  Patient with acute renal failure.  History HIV. EXAM: RENAL / URINARY TRACT ULTRASOUND COMPLETE COMPARISON:  None. FINDINGS: Right Kidney: Length: 11.5 cm. Markedly increased renal cortical echogenicity. No hydronephrosis. Left Kidney: Length: 11.4 cm. Markedly increased renal cortical echogenicity. No hydronephrosis. Bladder: Appears normal for degree of bladder distention. IMPRESSION: No hydronephrosis. Markedly increased renal cortical echogenicity compatible with medical renal disease. Electronically Signed   By: Lovey Newcomer M.D.   On: 02/07/2015 08:45   Dg Chest Port 1 View  02/06/2015  CLINICAL DATA:  Weakness.  Cough.  History of HIV. EXAM: PORTABLE CHEST 1 VIEW COMPARISON:  06/28/2011. FINDINGS: There are ill-defined interstitial densities bilaterally most evident in the left lower lung. Similar densities have been present on multiple prior chest radiographs. Lungs are hyperexpanded.  No pleural effusion or pneumothorax. Cardiac silhouette is normal in size. No mediastinal or hilar masses or convincing adenopathy. Bony thorax is intact. IMPRESSION: 1. Ill-defined interstitial opacities most evident in the left lower lung. Similar densities have been present on multiple prior studies. Findings may all be chronic. However, acute interstitial infection, most notably involving the left lower lobe, should be considered likely given the patient's history. 2. No focal consolidation is seen to suggest lobar pneumonia. No pulmonary  edema. Electronically Signed   By: Lajean Manes M.D.   On: 02/06/2015 20:55    Labs: BMET  Recent Labs Lab 02/06/15 1924 02/07/15 0443  NA 139 137  K 4.3 3.7  CL 108 112*  CO2 16* 15*  GLUCOSE 120* 96  BUN 74* 67*  CREATININE 8.82* 8.15*  CALCIUM 7.3* 6.3*   CBC  Recent Labs Lab 02/06/15 1924 02/07/15 0443 02/07/15 1625  WBC 13.1* 10.7* 8.6  NEUTROABS 10.2* 8.5*  --   HGB 8.3* 6.1* 7.7*  HCT 25.3* 18.9* 23.8*  MCV 71.1* 71.1* 74.6*  PLT 237 185 173    Medications:    . cefTRIAXone (ROCEPHIN)  IV  1 g Intravenous Q24H  . clindamycin (CLEOCIN) IV  600 mg Intravenous Q6H  . heparin  5,000 Units Subcutaneous 3 times per day  . primaquine  30 mg Oral Daily  . sodium chloride  3 mL Intravenous Q12H  . valACYclovir  500 mg Oral Daily      Otelia Santee, MD 02/08/2015, 10:05 AM

## 2015-02-08 NOTE — Progress Notes (Signed)
Patient ID: Paula Becker, female   DOB: 27-Dec-1975, 40 y.o.   MRN: [id number]         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 cefTRIAXone (ROCEPHIN)  IV  1 g Intravenous Q24H  . clindamycin (CLEOCIN) IV  600 mg Intravenous Q6H  . feeding supplement (NEPRO CARB STEADY)  237 mL Oral BID BM  . heparin  5,000 Units Subcutaneous 3 times per day  . megestrol  400 mg Oral Daily  . primaquine  30 mg Oral Daily  . sodium chloride  3 mL Intravenous Q12H  . valACYclovir  500 mg Oral Daily    SUBJECTIVE: She states that she is feeling a little bit better. She is not as weak as she was on admission. She continues to have a dry, nonproductive cough. She denies shortness of breath. She states that she was not taking her antiretroviral medications recently because she had lost her appetite.  Review of Systems: Review of Systems  Constitutional: Positive for malaise/fatigue. Negative for fever, chills, weight loss and diaphoresis.  Respiratory: Positive for cough. Negative for sputum production and shortness of breath.   Cardiovascular: Negative for chest pain.  Gastrointestinal: Negative for nausea, vomiting and diarrhea.  Neurological: Negative for headaches.    Past Medical History  Diagnosis Date  . HIV (human immunodeficiency virus infection) ([place])   . MRSA (methicillin resistant staph aureus) culture positive   . Necrotizing pneumonia ([place]) 07/2010  . Pneumonia 06/23/11    RLL patchy, nodular lung disease  . Anemia of chronic disease   . History of noncompliance with medical treatment   . Herpes      Social History  Substance Use Topics  . Smoking status: Never Smoker   . Smokeless tobacco: Never Used  . Alcohol Use: No    Family History  Problem Relation Age of Onset  . Lung disease Mother     passed away at 34 with lung disease  . Hypertension Father    Allergies  Allergen Reactions  . Shellfish-Derived Products Swelling    Facial swelling  . Vancomycin Rash    OBJECTIVE: Filed Vitals:   02/07/15 1630 02/07/15 2123 02/08/15 0500 02/08/15 1029  BP: 113/80 112/73 135/82 123/78  Pulse: 81 94 89   Temp: 98.1 F (36.7 C) 98.5 F (36.9 C) 98.6 F (37 C) 98.4 F (36.9 C)  TempSrc: Oral Oral Oral Oral  Resp: 18 18 18 18   Height:      Weight:  127 lb (57.607 kg)    SpO2: 100% 100% 99% 100%   Body mass index is 17.72 kg/(m^2).  Physical Exam  Constitutional: She is oriented to person, place, and time. No distress.  HENT:  Mouth/Throat: No oropharyngeal exudate.  Eyes: Conjunctivae are normal.  Cardiovascular: Normal rate and regular rhythm.   No murmur heard. Pulmonary/Chest: Effort normal. She has rales.  Abdominal: Soft. There is no tenderness.  Neurological: She is alert and oriented to person, place, and time.  Skin: No rash noted.  Psychiatric: Mood and  affect normal.    Lab Results Lab Results  Component Value Date   WBC 7.5 02/08/2015   HGB 8.1* 02/08/2015   HCT 24.7* 02/08/2015   MCV 74.0* 02/08/2015   PLT 228 02/08/2015    Lab Results  Component Value Date   CREATININE 7.82* 02/08/2015   BUN 58* 02/08/2015   NA 139 02/08/2015   K 3.6 02/08/2015   CL 109 02/08/2015   CO2 21* 02/08/2015    Lab Results  Component Value Date   ALT 7* 02/07/2015   AST 13* 02/07/2015   ALKPHOS 67 02/07/2015   BILITOT 0.3 02/07/2015     Microbiology: Recent Results (from the past 240 hour(s))  Blood culture (routine x 2)     Status: None (Preliminary result)   Collection Time: 02/06/15  8:08 PM  Result Value Ref Range Status   Specimen Description  BLOOD LEFT ARM  Final   Special Requests BOTTLES DRAWN AEROBIC AND ANAEROBIC 5CC  Final   Culture NO GROWTH 2 DAYS  Final   Report Status PENDING  Incomplete  Blood culture (routine x 2)     Status: None (Preliminary result)   Collection Time: 02/06/15  8:21 PM  Result Value Ref Range Status   Specimen Description BLOOD LEFT ARM  Final   Special Requests BOTTLES DRAWN AEROBIC AND ANAEROBIC 5CC  Final   Culture NO GROWTH 2 DAYS  Final   Report Status PENDING  Incomplete  Gastrointestinal Panel by PCR , Stool     Status: None   Collection Time: 02/07/15  2:29 PM  Result Value Ref Range Status   Campylobacter species NOT DETECTED NOT DETECTED Final   Plesimonas shigelloides NOT DETECTED NOT DETECTED Final   Salmonella species NOT DETECTED NOT DETECTED Final   Yersinia enterocolitica NOT DETECTED NOT DETECTED Final   Vibrio species NOT DETECTED NOT DETECTED Final   Vibrio cholerae NOT DETECTED NOT DETECTED Final   Enteroaggregative E coli (EAEC) NOT DETECTED NOT DETECTED Final   Enteropathogenic E coli (EPEC) NOT DETECTED NOT DETECTED Final   Enterotoxigenic E coli (ETEC) NOT DETECTED NOT DETECTED Final   Shiga like toxin producing E coli (STEC) NOT DETECTED NOT DETECTED Final   E. coli O157 NOT DETECTED NOT DETECTED Final   Shigella/Enteroinvasive E coli (EIEC) NOT DETECTED NOT DETECTED Final   Cryptosporidium NOT DETECTED NOT DETECTED Final   Cyclospora cayetanensis NOT DETECTED NOT DETECTED Final   Entamoeba histolytica NOT DETECTED NOT DETECTED Final   Giardia lamblia NOT DETECTED NOT DETECTED Final   Adenovirus F40/41 NOT DETECTED NOT DETECTED Final   Astrovirus NOT DETECTED NOT DETECTED Final   Norovirus GI/GII NOT DETECTED NOT DETECTED Final   Rotavirus A NOT DETECTED NOT DETECTED Final   Sapovirus (I, II, IV, and V) NOT DETECTED NOT DETECTED Final   Pneumococcal urinary antigen: Positive Legionella urinary antigen: Negative  ASSESSMENT: She probably has pneumococcal  pneumonia. The studies evaluating the utility of pneumococcal urinary antigen are far from perfect. There can be some false positive test results with streptococcal infection other than pneumococcus but the specificity appears to be sufficiently high to make a presumptive diagnosis. I will continue ceftriaxone and stop clindamycin and primaquine now.  PLAN: 1. Continue ceftriaxone 2. Discontinue clindamycin and primaquine  Michel Bickers, MD [facility] for [facility] [phone] pager   [phone] cell 02/08/2015, 5:01 PM

## 2015-02-08 NOTE — Progress Notes (Signed)
Subjective:  No acute events overnight. Sitting in the chair when examined  She says she is subjectively feeling a little better. Had one episode of loose watery nonbloody diarrhea overnight. No fevers, chills or vomiting. She was a little more open to the idea of taking pills trying various methods, which I discussed with the nurse about.  She says she does not have an appetite but did ask for cereal, milk and orange juice, which I informed the nurse to bring. Also, diet changed to soft.     Objective: Vital signs in last 24 hours: Filed Vitals:   02/07/15 1630 02/07/15 2123 02/08/15 0500 02/08/15 1029  BP: 113/80 112/73 135/82 123/78  Pulse: 81 94 89   Temp: 98.1 F (36.7 C) 98.5 F (36.9 C) 98.6 F (37 C) 98.4 F (36.9 C)  TempSrc: Oral Oral Oral Oral  Resp: 18 18 18 18   Height:      Weight:  127 lb (57.607 kg)    SpO2: 100% 100% 99% 100%   Weight change: 3 lb 14.4 oz (1.769 kg)  Intake/Output Summary (Last 24 hours) at 02/08/15 1102 Last data filed at 02/08/15 0500  Gross per 24 hour  Intake 835.83 ml  Output    750 ml  Net  85.83 ml   General: Vital signs reviewed. thin looking pt sitting in bed Cardiovascular: regular rate, rhythm, no murmur appreciated  Pulmonary/Chest: Clear to auscultation bilaterally, no wheezes, rales, or rhonchi. Abdominal: Soft, some ttp in lower abdomen, non-distended, BS + Extremities: No lower extremity edema bilaterally, pulses symmetric and intact bilaterally. Skin: Warm, dry and intact. No rashes or erythema.   Lab Results: Results for orders placed or performed during the hospital encounter of 02/06/15 (from the past 24 hour(s))  CBC     Status: Abnormal   Collection Time: 02/07/15  4:25 PM  Result Value Ref Range   WBC 8.6 4.0 - 10.5 K/uL   RBC 3.19 (L) 3.87 - 5.11 MIL/uL   Hemoglobin 7.7 (L) 12.0 - 15.0 g/dL   HCT 23.8 (L) 36.0 - 46.0 %   MCV 74.6 (L) 78.0 - 100.0 fL   MCH 24.1 (L) 26.0 - 34.0 pg   MCHC 32.4 30.0 -  36.0 g/dL   RDW 19.6 (H) 11.5 - 15.5 %   Platelets 173 150 - 400 K/uL  Magnesium     Status: None   Collection Time: 02/07/15  4:25 PM  Result Value Ref Range   Magnesium 1.7 1.7 - 2.4 mg/dL  Protein / creatinine ratio, urine     Status: Abnormal   Collection Time: 02/07/15  4:47 PM  Result Value Ref Range   Creatinine, Urine 27.94 mg/dL   Total Protein, Urine 860 mg/dL   Protein Creatinine Ratio 30.78 (H) 0.00 - 0.15 mg/mg[Cre]  Strep pneumoniae urinary antigen     Status: Abnormal   Collection Time: 02/07/15  4:48 PM  Result Value Ref Range   Strep Pneumo Urinary Antigen POSITIVE (A) NEGATIVE    Micro Results: Recent Results (from the past 240 hour(s))  Blood culture (routine x 2)     Status: None (Preliminary result)   Collection Time: 02/06/15  8:08 PM  Result Value Ref Range Status   Specimen Description BLOOD LEFT ARM  Final   Special Requests BOTTLES DRAWN AEROBIC AND ANAEROBIC 5CC  Final   Culture NO GROWTH < 12 HOURS  Final   Report Status PENDING  Incomplete  Blood culture (routine x 2)  Status: None (Preliminary result)   Collection Time: 02/06/15  8:21 PM  Result Value Ref Range Status   Specimen Description BLOOD LEFT ARM  Final   Special Requests BOTTLES DRAWN AEROBIC AND ANAEROBIC 5CC  Final   Culture NO GROWTH < 12 HOURS  Final   Report Status PENDING  Incomplete   Studies/Results: X-ray Chest Pa And Lateral  02/07/2015  CLINICAL DATA:  Pneumocystis carinii pneumonia.  Fever. EXAM: CHEST  2 VIEW COMPARISON:  02/06/2015 FINDINGS: The cardiac silhouette is within normal limits in size for AP technique. Ill-defined diffuse interstitial opacities are again seen with more confluent opacities in both lung bases. Right basilar opacities have increased from yesterday's study. Left basilar opacities also appear minimally worse. No sizable pleural effusion or pneumothorax is identified. IMPRESSION: Increasing bibasilar opacities concerning for acute infection.  Electronically Signed   By: Logan Bores M.D.   On: 02/07/2015 10:11   US Renal  02/07/2015  CLINICAL DATA:  Patient with acute renal failure.  History HIV. EXAM: RENAL / URINARY TRACT ULTRASOUND COMPLETE COMPARISON:  None. FINDINGS: Right Kidney: Length: 11.5 cm. Markedly increased renal cortical echogenicity. No hydronephrosis. Left Kidney: Length: 11.4 cm. Markedly increased renal cortical echogenicity. No hydronephrosis. Bladder: Appears normal for degree of bladder distention. IMPRESSION: No hydronephrosis. Markedly increased renal cortical echogenicity compatible with medical renal disease. Electronically Signed   By: Lovey Newcomer M.D.   On: 02/07/2015 08:45   Dg Chest Port 1 View  02/06/2015  CLINICAL DATA:  Weakness.  Cough.  History of HIV. EXAM: PORTABLE CHEST 1 VIEW COMPARISON:  06/28/2011. FINDINGS: There are ill-defined interstitial densities bilaterally most evident in the left lower lung. Similar densities have been present on multiple prior chest radiographs. Lungs are hyperexpanded.  No pleural effusion or pneumothorax. Cardiac silhouette is normal in size. No mediastinal or hilar masses or convincing adenopathy. Bony thorax is intact. IMPRESSION: 1. Ill-defined interstitial opacities most evident in the left lower lung. Similar densities have been present on multiple prior studies. Findings may all be chronic. However, acute interstitial infection, most notably involving the left lower lobe, should be considered likely given the patient's history. 2. No focal consolidation is seen to suggest lobar pneumonia. No pulmonary edema. Electronically Signed   By: Lajean Manes M.D.   On: 02/06/2015 20:55   Medications: I have reviewed the patient's current medications. Scheduled Meds: . cefTRIAXone (ROCEPHIN)  IV  1 g Intravenous Q24H  . clindamycin (CLEOCIN) IV  600 mg Intravenous Q6H  . heparin  5,000 Units Subcutaneous 3 times per day  . primaquine  30 mg Oral Daily  . sodium chloride  3 mL  Intravenous Q12H  . valACYclovir  500 mg Oral Daily   Continuous Infusions: . sodium chloride 75 mL/hr at 02/08/15 1032  . 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 Gazzo is a 40 yo female with HIV (last CD4 count 60 in Aug 2016), HSV, and recurrent MRSA furuncles, presenting with generalized weakness and diarrhea.   Sepsis 2/2 PNA with positive strep pneumo  antigen: Chronically ill female with HIV/AIDS, presenting with tachycardia, 2-3 weeks cough productive of green sputum and diffuse interstitial pulmonary infiltrates on CXR. However, given her severe immunosuppression, the differential remains broad, including viral, bacterial, or fungal PNA.  Repeat Chest xray shows worsening bibasilar infiltrates. Being followed by ID and Renal. Her urine was positive for strep pneumoniae antigen, negative for legionella. She has been afebrile.  -consulted ID- Dr. Baxter Flattery- appreciate recommendations -ceftriaxone IV for Strep pneumo coverage,  -continuing clinda and primaquin for pcp ppx  -weekly azithro and fluconazole per ID -HOLDING ARTs int he setting of ARF  -trend daily cbcs for white count  -f./u CMV and EBV panel -f/u cryptosporidium   Acute Renal Failure: leading to metabolic acidosis on admission.  Normal Cr baseline 1.5-1.6, now presenting with Cr 8.82. Etiology unclear and likely multifactorial in the setting of decreased PO intake, sepsis, and medication. However, patient reports not having taken any of her  medications for > 1 week. U/A ordered in ED pending. Will provide IVF and monitor Cr. FeNa was > 1but there are other etiologies. If there are no improvement in RF, may need biopsy.  Renal ultrasound shows markedly increased renal cortical echogenicity bilaterally.   Trending creatinine 8.82>8.15-> 7.82 from 1/9 2017 Bicarb has improved to 21   -consulted nephrology, appreciate their recommendations  -her protein creatinine ratio is 30.9 which is very high.  -most likely will need a biopsy - D5 1000 mL with sodium bicarb 150 mEq infusion at 50 cc/hr  Per renal -NS at 75 cc/hr for 1 L per renal  -daily renal functional panels   -f/u dsDNA, c3,c4, and ana antibodies, complement level, PTH, hep c ab ,   AIDS: Last CD4 count 60 in Aug 2016. Patient has long h/o medication noncompliance. She is very resistant to the idea of swallowing medication tablets. Depression likely playing a role in her weakness, malnutrition, and noncompliance.    - HOLDING ARTs -treating for AIDS defining illnesses- covering for pcp, strep pneumo.  -weekly azithro and fluconazole   Symptomatic microcytic anemia: Came in with hgb of <7. Very low transferrin saturation. Received 1 unit of PRBC. Her post transfusion CBC was 7.7 hgb  , 1/9 hgb 8.1  -trend daily cbcs  -transfuse if <7 - HOLDING IV iron until her infection is better controlled   Diarrhea: Patient reports 3 loose stools while too weak to get out of bed. Given her immunosuppression, infectious etiologies are definitely possible. Patient attributes her diarrhea to meal eaten yesterday. Will investigate stool panel and monitor.   -follow up cdiff and cryptosporidium studies  -enteric precautions   Weakness and malnutrition : Likely chronic due to deconditioning and acutely worsened in setting of infection. Patient admits she sleeps all day in bed and has severe protein calorie malnutrition. She denies weight loss or night sweats concerning for  underlying malignancy.  -megace suspension 400 mg daily in an attempt to boost appetite - PT/OT/Nutrition   HSV: Patient has active lesions on gluteal cleft, but is also in acute renal failure. Will renally dose Valtrex  - Valacyclovir 500 mg daily, renally dosed per pharm    Dispo: Disposition is deferred at this time, awaiting improvement of current medical problems.  Anticipated discharge in approximately 2 day(s).   The patient does have a current PCP Campbell Riches, MD) and does need an [facility] hospital follow-up appointment after discharge.  The patient does not have transportation limitations that hinder transportation to clinic appointments.  .Services  Needed at time of discharge: Y = Yes, Blank = No PT:   OT:   RN:   Equipment:   Other:     LOS: 2 days   Burgess Estelle, MD 02/08/2015, 11:02 AM

## 2015-02-08 NOTE — Evaluation (Signed)
Occupational Therapy Evaluation Patient Details Name: Paula Becker MRN: [id number] DOB: [birth year] Today's Date: 02/08/2015    History of Present Illness 40 yo female admitted with weakness and diarrhea x 1 day. Pt with sepsis secondary to pNA.  PMH: HIV, MRSA   Clinical Impression   PT admitted with sepsis secondary to PNA. Pt currently with functional limitiations due to the deficits listed below (see OT problem list). PTA independent with ADLS and family (A) for IADLS. Pt will benefit from skilled OT to increase their independence and safety with adls and balance to allow discharge [place].     Follow Up Recommendations  Home health OT    Equipment Recommendations  3 in 1 bedside comode    Recommendations for Other Services       Precautions / Restrictions Precautions Precautions: Fall      Mobility Bed Mobility Overal bed mobility: Needs Assistance Bed Mobility: Supine to Sit     Supine to sit: Mod assist     General bed mobility comments: pt attempting to exit on the R with cues to exit on the L. pt requires (A) to log roll L and to push into sitting. pt c/o back pain  Transfers Overall transfer level: Needs assistance Equipment used: 2 person hand held assist Transfers: Sit to/from Stand Sit to Stand: +2 physical assistance;Min assist         General transfer comment: cues to push up into standing. pt with cues to progress to 3n1    Balance Overall balance assessment: Needs assistance         Standing balance support: Single extremity supported;During functional activity Standing balance-Leahy Scale: Fair                              ADL Overall ADL's : Needs assistance/impaired     Grooming: Wash/dry hands;Wash/dry face;Oral care;Set up;Standing Grooming Details (indicate cue type and reason): requires 1 Ue for support with balance     Lower Body Bathing: Moderate assistance;Sit to/from stand Lower Body Bathing Details (indicate  cue type and reason): (A) for peri care. pt with c/o of pain with peri care due to voiding bowels freq         Toilet Transfer: +2 for physical assistance;Minimal assistance;BSC   Toileting- Clothing Manipulation and Hygiene: Moderate assistance       Functional mobility during ADLs: Minimal assistance General ADL Comments: Pt requires hand held (A) due to balance deficits. pt very reluctant to complete 3n1 transfer and needs max cueing. pt asking for bed pan. pt educated that all voiding needs to be on 3n1 now     Vision     Perception     Praxis      Pertinent Vitals/Pain Pain Assessment: Faces Faces Pain Scale: Hurts little more Pain Location: back Pain Intervention(s): Monitored during session;Repositioned     Hand Dominance Right   Extremity/Trunk Assessment Upper Extremity Assessment Upper Extremity Assessment: LUE deficits/detail LUE Deficits / Details: holds L UE with elbow flexion adducted to the body but able to perform full elbow range of motion   Lower Extremity Assessment Lower Extremity Assessment: Defer to PT evaluation   Cervical / Trunk Assessment Cervical / Trunk Assessment: Other exceptions (scolisosis)   Communication Communication Communication: No difficulties   Cognition Arousal/Alertness: Awake/alert Behavior During Therapy: WFL for tasks assessed/performed Overall Cognitive Status: Within Functional Limits for tasks assessed  General Comments       Exercises       Shoulder Instructions      Home Living Family/patient expects to be discharged to:: Private residence Living Arrangements: Children;Parent (son and father ) Available Help at Discharge: Family;Available 24 hours/day Type of Home: House Home Access: Stairs to enter   Entrance Stairs-Rails: Can reach both Home Layout: One level     Bathroom Shower/Tub: Teacher, early years/pre: Standard     Home Equipment: None   Additional  Comments: pt relies on family for rides to appointments and IADLS. Pt completes all ADLS independently      Prior Functioning/Environment Level of Independence: Independent             OT Diagnosis: Generalized weakness;Acute pain   OT Problem List: Decreased strength;Decreased activity tolerance;Impaired balance (sitting and/or standing);Decreased safety awareness;Decreased knowledge of use of DME or AE;Decreased knowledge of precautions;Pain   OT Treatment/Interventions: Self-care/ADL training;Therapeutic exercise;DME and/or AE instruction;Energy conservation;Therapeutic activities;Patient/family education;Balance training    OT Goals(Current goals can be found in the care plan section) Acute Rehab OT Goals Patient Stated Goal: none stated OT Goal Formulation: With patient Time For Goal Achievement: 02/22/15 Potential to Achieve Goals: Good  OT Frequency: Min 2X/week   Barriers to D/C:            Co-evaluation              End of Session Equipment Utilized During Treatment: Gait belt Nurse Communication: Mobility status;Precautions  Activity Tolerance: Patient tolerated treatment well Patient left: in chair;with call bell/phone within reach   Time: 0730-0754 OT Time Calculation (min): 24 min Charges:  OT General Charges $OT Visit: 1 Procedure OT Evaluation $OT Eval Moderate Complexity: 1 Procedure G-Codes:    Peri Maris 02/20/15, 8:20 AM   cotx - complexity and safety  Self care  Jeri Modena   OTR/L Pager: [phone] Office: [phone] .

## 2015-02-08 NOTE — Evaluation (Signed)
Physical Therapy Evaluation Patient Details Name: Paula Becker MRN: [id number] DOB: July 19, 1975 Today's Date: 02/08/2015   History of Present Illness  40 yo female admitted with weakness and diarrhea x 1 day. Pt with sepsis secondary to pNA.  PMH: HIV, MRSA, and scoliosis with chronic back pain.  Clinical Impression  Pt presenting with generalized deconditioning requiring min/modA for all mobility. Pt frequently requesting bed pan and educated on importance of getting up to atleast the [facility] to help maintain strength and endurance. Pt with 24/7 assist at home and suspect will progress well enough to be able to return home with family.    Follow Up Recommendations Home health PT;Supervision/Assistance - 24 hour    Equipment Recommendations  None recommended by PT (if pt doesn't progress to supervision may need RW)    Recommendations for Other Services       Precautions / Restrictions Precautions Precautions: Fall Restrictions Weight Bearing Restrictions: No      Mobility  Bed Mobility Overal bed mobility: Needs Assistance Bed Mobility: Supine to Sit;Rolling Rolling: Mod assist   Supine to sit: Mod assist     General bed mobility comments: pt attempting to exit on the R with cues to exit on the L. pt requires (A) to log roll L and to push into sitting. pt c/o back pain. assist for trunk elevation and v/c's for hand placement, increased time required  Transfers Overall transfer level: Needs assistance Equipment used: 2 person hand held assist Transfers: Sit to/from Stand;Stand Pivot Transfers Sit to Stand: +2 physical assistance;Min assist Stand pivot transfers: Min assist;+2 physical assistance (per patient request)       General transfer comment: cues to push up into standing. pt with cues to progress to 3n1  Ambulation/Gait Ambulation/Gait assistance: Min assist Ambulation Distance (Feet): 20 Feet (x2 (to/from sink)) Assistive device: 1 person hand held assist Gait  Pattern/deviations: Step-through pattern;Decreased stride length;Narrow base of support Gait velocity: decreased Gait velocity interpretation: Below normal speed for age/gender General Gait Details: pt slow and guarded, mildly unsteady but improved by end of ambulation  Stairs            Wheelchair Mobility    Modified Rankin (Stroke Patients Only)       Balance Overall balance assessment: Needs assistance Sitting-balance support: Feet supported;No upper extremity supported Sitting balance-Leahy Scale: Good     Standing balance support: Single extremity supported;During functional activity Standing balance-Leahy Scale: Fair Standing balance comment: pt stood at sink to brush teeth and required to hold onto sink with unilateral UE                             Pertinent Vitals/Pain Pain Assessment: Faces Faces Pain Scale: Hurts little more Pain Location: back Pain Descriptors / Indicators:  (chronic pain from scoliosis) Pain Intervention(s): Monitored during session    Home Living Family/patient expects to be discharged to:: Private residence Living Arrangements: Children;Parent (son and father ) Available Help at Discharge: Family;Available 24 hours/day Type of Home: House Home Access: Stairs to enter Entrance Stairs-Rails: Can reach both Entrance Stairs-Number of Steps: 3 Home Layout: One level Home Equipment: None Additional Comments: pt relies on family for rides to appointments and IADLS. Pt completes all ADLS independently    Prior Function Level of Independence: Independent               Hand Dominance   Dominant Hand: Right    Extremity/Trunk Assessment   Upper  Extremity Assessment: Defer to OT evaluation       LUE Deficits / Details: holds L UE with elbow flexion adducted to the body but able to perform full elbow range of motion   Lower Extremity Assessment: Generalized weakness (slow and guarded with all movements)       Cervical / Trunk Assessment: Other exceptions (scolisosis)  Communication   Communication: No difficulties  Cognition Arousal/Alertness: Awake/alert Behavior During Therapy: WFL for tasks assessed/performed Overall Cognitive Status: Within Functional Limits for tasks assessed                      General Comments General comments (skin integrity, edema, etc.): discomfort with peri care. barrrier cream applied    Exercises        Assessment/Plan    PT Assessment Patient needs continued PT services  PT Diagnosis Generalized weakness   PT Problem List Decreased strength;Decreased activity tolerance;Decreased balance;Decreased mobility  PT Treatment Interventions DME instruction;Gait training;Stair training;Functional mobility training;Therapeutic activities;Therapeutic exercise;Balance training   PT Goals (Current goals can be found in the Care Plan section) Acute Rehab PT Goals Patient Stated Goal: none stated PT Goal Formulation: With patient Time For Goal Achievement: 02/15/15 Potential to Achieve Goals: Good    Frequency Min 3X/week   Barriers to discharge        Co-evaluation PT/OT/SLP Co-Evaluation/Treatment: Yes Reason for Co-Treatment: Other (comment);Necessary to address cognition/behavior during functional activity (pt only agreed to get up once) PT goals addressed during session: Mobility/safety with mobility         End of Session Equipment Utilized During Treatment: Gait belt Activity Tolerance: Patient tolerated treatment well Patient left: in chair;with call bell/phone within reach Nurse Communication: Mobility status         Time: [id number] PT Time Calculation (min) (ACUTE ONLY): 23 min   Charges:   PT Evaluation $PT Eval Moderate Complexity: 1 Procedure     PT G CodesKingsley Becker 02/08/2015, 9:05 AM   Paula Becker, PT, DPT Pager #: [phone] Office #: [phone]

## 2015-02-09 DIAGNOSIS — J13 Pneumonia due to Streptococcus pneumoniae: Secondary | ICD-10-CM

## 2015-02-09 DIAGNOSIS — E440 Moderate protein-calorie malnutrition: Secondary | ICD-10-CM | POA: Insufficient documentation

## 2015-02-09 LAB — CBC
HCT: 23.7 % — ABNORMAL LOW (ref 36.0–46.0)
HEMOGLOBIN: 7.8 g/dL — AB (ref 12.0–15.0)
MCH: 24.5 pg — ABNORMAL LOW (ref 26.0–34.0)
MCHC: 32.9 g/dL (ref 30.0–36.0)
MCV: 74.5 fL — ABNORMAL LOW (ref 78.0–100.0)
PLATELETS: 199 10*3/uL (ref 150–400)
RBC: 3.18 MIL/uL — AB (ref 3.87–5.11)
RDW: 19.3 % — ABNORMAL HIGH (ref 11.5–15.5)
WBC: 5.5 10*3/uL (ref 4.0–10.5)

## 2015-02-09 LAB — PARATHYROID HORMONE, INTACT (NO CA): PTH: 405 pg/mL — ABNORMAL HIGH (ref 15–65)

## 2015-02-09 LAB — RENAL FUNCTION PANEL
Anion gap: 9 (ref 5–15)
BUN: 55 mg/dL — AB (ref 6–20)
CALCIUM: 6.5 mg/dL — AB (ref 8.9–10.3)
CHLORIDE: 106 mmol/L (ref 101–111)
CO2: 24 mmol/L (ref 22–32)
CREATININE: 7.92 mg/dL — AB (ref 0.44–1.00)
GFR calc Af Amer: 7 mL/min — ABNORMAL LOW (ref >60)
GFR, EST NON AFRICAN AMERICAN: 6 mL/min — AB (ref >60)
Glucose, Bld: 92 mg/dL (ref 65–99)
PHOSPHORUS: 6 mg/dL — AB (ref 2.5–4.6)
Potassium: 3.4 mmol/L — ABNORMAL LOW (ref 3.5–5.1)
SODIUM: 139 mmol/L (ref 135–145)

## 2015-02-09 LAB — C3 COMPLEMENT: C3 Complement: 108 mg/dL (ref 82–167)

## 2015-02-09 LAB — HEPATITIS C ANTIBODY (REFLEX): HCV Ab: <0.1 s/co ratio (ref 0.0–0.9)

## 2015-02-09 LAB — C4 COMPLEMENT: Complement C4, Body Fluid: 28 mg/dL (ref 14–44)

## 2015-02-09 LAB — ANTINUCLEAR ANTIBODIES, IFA: ANTINUCLEAR ANTIBODIES, IFA: NEGATIVE

## 2015-02-09 LAB — ANTI-DNA ANTIBODY, DOUBLE-STRANDED

## 2015-02-09 LAB — C DIFFICILE QUICK SCREEN W PCR REFLEX
C Diff antigen: NEGATIVE
C Diff interpretation: NEGATIVE
C Diff toxin: NEGATIVE

## 2015-02-09 LAB — MPO/PR-3 (ANCA) ANTIBODIES

## 2015-02-09 LAB — MAGNESIUM: MAGNESIUM: 1.6 mg/dL — AB (ref 1.7–2.4)

## 2015-02-09 LAB — HCV COMMENT:

## 2015-02-09 MED ORDER — SODIUM CHLORIDE 0.9 % IV SOLN
INTRAVENOUS | Status: DC
  Administered 2015-02-09 – 2015-02-11 (×5): via INTRAVENOUS

## 2015-02-09 MED ORDER — AZITHROMYCIN 600 MG PO TABS
1200.0000 mg | ORAL_TABLET | ORAL | Status: DC
  Administered 2015-02-09: 1200 mg via ORAL
  Filled 2015-02-09: qty 2

## 2015-02-09 MED ORDER — SODIUM CHLORIDE 0.9 % IV SOLN
INTRAVENOUS | Status: DC
  Administered 2015-02-09: 15:00:00 via INTRAVENOUS

## 2015-02-09 MED ORDER — POTASSIUM CHLORIDE CRYS ER 20 MEQ PO TBCR
40.0000 meq | EXTENDED_RELEASE_TABLET | Freq: Once | ORAL | Status: AC
  Administered 2015-02-09: 40 meq via ORAL
  Filled 2015-02-09: qty 2

## 2015-02-09 MED ORDER — SULFAMETHOXAZOLE-TRIMETHOPRIM 400-80 MG PO TABS
1.0000 | ORAL_TABLET | ORAL | Status: DC
  Administered 2015-02-10: 1 via ORAL
  Filled 2015-02-09 (×3): qty 1

## 2015-02-09 MED ORDER — POTASSIUM CHLORIDE 20 MEQ PO PACK: 40.0000 meq | PACK | Freq: Once | ORAL | Status: DC

## 2015-02-09 MED ORDER — FLUCONAZOLE IN SODIUM CHLORIDE 100-0.9 MG/50ML-% IV SOLN
100.0000 mg | INTRAVENOUS | Status: DC
  Administered 2015-02-09: 100 mg via INTRAVENOUS
  Filled 2015-02-09 (×3): qty 50

## 2015-02-09 NOTE — Progress Notes (Signed)
Subjective:  No acute events overnight. Lying in bed when examined- looked better than before.  She says she is subjectively feeling a little better. Had one episode of loose watery nonbloody diarrhea overnight. No fevers, chills or vomiting.  She says that her left fingers are painful which is new. She says she does not eat breakfast but eats lunch and dinner.    Objective: Vital signs in last 24 hours: Filed Vitals:   02/08/15 2047 02/09/15 0100 02/09/15 0618 02/09/15 0928  BP: 123/73  127/80 131/82  Pulse: 79  80 76  Temp: 98 F (36.7 C)  98.5 F (36.9 C) 98.3 F (36.8 C)  TempSrc: Oral  Oral Oral  Resp: 18  16 18   Height:      Weight:  136 lb 11.2 oz (62.007 kg)    SpO2: 99%  100% 99%   Weight change: 9 lb 11.2 oz (4.4 kg)  Intake/Output Summary (Last 24 hours) at 02/09/15 1151 Last data filed at 02/09/15 1005  Gross per 24 hour  Intake   3510 ml  Output    750 ml  Net   2760 ml   General: Vital signs reviewed. thin looking pt lying in bed Cardiovascular: regular rate, rhythm, no murmur appreciated  Pulmonary/Chest: Clear to auscultation bilaterally, no wheezes, rales, or rhonchi. Abdominal: Soft, some ttp in lower abdomen, non-distended, BS + Extremities: No lower extremity edema bilaterally, pulses symmetric and intact bilaterally.       Left fingers are swollen  Skin: Warm, dry and intact. No rashes or erythema.   Lab Results: Results for orders placed or performed during the hospital encounter of 02/06/15 (from the past 24 hour(s))  CBC     Status: Abnormal   Collection Time: 02/09/15  5:33 AM  Result Value Ref Range   WBC 5.5 4.0 - 10.5 K/uL   RBC 3.18 (L) 3.87 - 5.11 MIL/uL   Hemoglobin 7.8 (L) 12.0 - 15.0 g/dL   HCT 23.7 (L) 36.0 - 46.0 %   MCV 74.5 (L) 78.0 - 100.0 fL   MCH 24.5 (L) 26.0 - 34.0 pg   MCHC 32.9 30.0 - 36.0 g/dL   RDW 19.3 (H) 11.5 - 15.5 %   Platelets 199 150 - 400 K/uL  Magnesium     Status: Abnormal   Collection Time:  02/09/15  5:33 AM  Result Value Ref Range   Magnesium 1.6 (L) 1.7 - 2.4 mg/dL  Renal function panel     Status: Abnormal   Collection Time: 02/09/15  5:33 AM  Result Value Ref Range   Sodium 139 135 - 145 mmol/L   Potassium 3.4 (L) 3.5 - 5.1 mmol/L   Chloride 106 101 - 111 mmol/L   CO2 24 22 - 32 mmol/L   Glucose, Bld 92 65 - 99 mg/dL   BUN 55 (H) 6 - 20 mg/dL   Creatinine, Ser 7.92 (H) 0.44 - 1.00 mg/dL   Calcium 6.5 (L) 8.9 - 10.3 mg/dL   Phosphorus 6.0 (H) 2.5 - 4.6 mg/dL   Albumin <1.0 (L) 3.5 - 5.0 g/dL   GFR calc non Af Amer 6 (L) >60 mL/min   GFR calc Af Amer 7 (L) >60 mL/min   Anion gap 9 5 - 15    Micro Results: Recent Results (from the past 240 hour(s))  Blood culture (routine x 2)     Status: None (Preliminary result)   Collection Time: 02/06/15  8:08 PM  Result Value Ref Range Status  Specimen Description BLOOD LEFT ARM  Final   Special Requests BOTTLES DRAWN AEROBIC AND ANAEROBIC 5CC  Final   Culture NO GROWTH 2 DAYS  Final   Report Status PENDING  Incomplete  Blood culture (routine x 2)     Status: None (Preliminary result)   Collection Time: 02/06/15  8:21 PM  Result Value Ref Range Status   Specimen Description BLOOD LEFT ARM  Final   Special Requests BOTTLES DRAWN AEROBIC AND ANAEROBIC 5CC  Final   Culture NO GROWTH 2 DAYS  Final   Report Status PENDING  Incomplete  Gastrointestinal Panel by PCR , Stool     Status: None   Collection Time: 02/07/15  2:29 PM  Result Value Ref Range Status   Campylobacter species NOT DETECTED NOT DETECTED Final   Plesimonas shigelloides NOT DETECTED NOT DETECTED Final   Salmonella species NOT DETECTED NOT DETECTED Final   Yersinia enterocolitica NOT DETECTED NOT DETECTED Final   Vibrio species NOT DETECTED NOT DETECTED Final   Vibrio cholerae NOT DETECTED NOT DETECTED Final   Enteroaggregative E coli (EAEC) NOT DETECTED NOT DETECTED Final   Enteropathogenic E coli (EPEC) NOT DETECTED NOT DETECTED Final   Enterotoxigenic  E coli (ETEC) NOT DETECTED NOT DETECTED Final   Shiga like toxin producing E coli (STEC) NOT DETECTED NOT DETECTED Final   E. coli O157 NOT DETECTED NOT DETECTED Final   Shigella/Enteroinvasive E coli (EIEC) NOT DETECTED NOT DETECTED Final   Cryptosporidium NOT DETECTED NOT DETECTED Final   Cyclospora cayetanensis NOT DETECTED NOT DETECTED Final   Entamoeba histolytica NOT DETECTED NOT DETECTED Final   Giardia lamblia NOT DETECTED NOT DETECTED Final   Adenovirus F40/41 NOT DETECTED NOT DETECTED Final   Astrovirus NOT DETECTED NOT DETECTED Final   Norovirus GI/GII NOT DETECTED NOT DETECTED Final   Rotavirus A NOT DETECTED NOT DETECTED Final   Sapovirus (I, II, IV, and V) NOT DETECTED NOT DETECTED Final   Studies/Results: No results found. Medications: I have reviewed the patient's current medications. Scheduled Meds: . azithromycin  1,200 mg Oral Weekly  . cefTRIAXone (ROCEPHIN)  IV  1 g Intravenous Q24H  . feeding supplement (NEPRO CARB STEADY)  237 mL Oral BID BM  . fluconazole (DIFLUCAN) IV  100 mg Intravenous Q7 days  . heparin  5,000 Units Subcutaneous 3 times per day  . megestrol  400 mg Oral Daily  . sodium chloride  3 mL Intravenous Q12H  . valACYclovir  500 mg Oral Daily   Continuous Infusions: . sodium chloride Stopped (02/09/15 0000)  . 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 Appel is a 40 yo female with HIV (last CD4  count 60 in Aug 2016), HSV, and recurrent MRSA furuncles, presenting with generalized weakness and diarrhea.   Community acquired pneumonia in the setting of AIDS with long term HAART noncompliance: Chronically ill female with HIV/AIDS, presenting with tachycardia, 2-3 weeks cough productive of green sputum and diffuse interstitial pulmonary infiltrates on CXR.Given  her severe immunosuppression, the differential remains broad, including viral, bacterial, or fungal PNA.  Repeat Chest xray shows worsening bibasilar infiltrates. Being followed by ID and Renal. Her urine was positive for strep pneumoniae antigen, negative for legionella. CMV negative. She has been afebrile and her vitals have been stable throughout.  -consulted ID- appreciate recommendations -ceftriaxone IV for Strep pneumo coverage,  -weekly azithro and fluconazole -per ID d/c clinda and primaquin -per Dr. 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  Normal Cr baseline 1.5-1.6, now presenting with Cr 8.82. Etiology unclear and likely multifactorial in the setting of decreased PO intake, sepsis, and medication. However, patient reports not having taken any of her medications for > 1 week.Given her dislike of taking pills, we think the non-compliance might have been long-term. Her bicarb improved but renal function did not improve after receiving 2 days of IV hydration. She also exhibited heavy proteinuria as her protein creatinine ratio is 30.9 which is high. Discussed with renal who think that while biopsy is required to establish the diagnosis of HIVAN/collapsing fsgs, her symptoms and heavy proteinuria make this diagnosis very likely. Her complement levels were normal, and Hep c antibodies were  normal.   ANA was negative. The most definite treatment for HIVAN is re-starting of the HAARTs, but we will ask ID to help guide the re-initiation of HAART. Steroids are not indicated for HIVAN.  Trending creatinine 8.82>8.15-> 7.82-->7.92 on 1/10/ 2017 Bicarb has improved to 24 -her protein creatinine ratio is 30.9 which is very high.    -restart HAART when appropriate- per ID -consulted nephrology, appreciate their recommendations  -daily renal functional panels   -f/u dsDNA    AIDS: Last CD4 count 60 in Aug 2016. Patient has long h/o medication noncompliance. She is very resistant to the idea of swallowing medication tablets. Depression likely playing a role in her weakness, malnutrition, and noncompliance.    - HOLDING ARTs -weekly azithro and fluconazole  -bactrim prophylaxis per ID- needs to be renally dosed -ceftriaxone for strep pneumo  Hyperparathyroidism: PTH > 400  -renal following -ordered vitamin D level  Symptomatic microcytic anemia: Came in with hgb of <7. Very low transferrin saturation. Received 1 unit of PRBC. Her post transfusion CBC was 7.7 hgb  , 1/9 hgb 8.1. Her hemoglobin has been stable throughout.   -trend daily cbcs  -transfuse if <7 - HOLDING IV iron until her infection is better controlled   Diarrhea: Patient reports 3 loose stools while too weak to get out of bed. Given her immunosuppression, infectious etiologies are definitely possible.   -GI panel was all negative. -follow up cdiff and cryptosporidium studies  -enteric precautions   Weakness and malnutrition int he setting of AIDS  : Likely chronic due to deconditioning and acutely worsened in setting of infection. Patient admits she sleeps all day in bed and has severe protein calorie malnutrition. She denies weight loss or night sweats concerning for underlying malignancy.  -megace suspension 400 mg daily in an attempt to boost appetite - PT/OT/Nutrition   HSV: Patient has active  lesions on gluteal cleft, but is also in acute renal failure. Will renally dose Valtrex  - Valacyclovir 500 mg daily, renally dosed per pharm   -she has been refusing valtrex throughout- IV valtrex  not possible given her renal function    Dispo: Disposition is deferred at this time, awaiting improvement of current medical problems.  Anticipated discharge in approximately 2 day(s).   The patient does have a current PCP Campbell Riches, MD) and does need an [facility] hospital follow-up appointment after discharge.  The patient does not have transportation limitations that hinder transportation to clinic appointments.  .Services Needed at time of discharge: Y = Yes, Blank = No PT:   OT:   RN:   Equipment:   Other:     LOS: 3 days   Burgess Estelle, MD 02/09/2015, 11:51 AM

## 2015-02-09 NOTE — Progress Notes (Signed)
[facility] Progress Note    Assessment/ Plan:   40 y.o. year-old with uncontrolled HIV (CD4 60 08/2014; multidrug resistance d/t poor adherence), recurrent MRSA furuncles, HSV. Admitted with progressive weakness, fatigue, productive cough, loose stools.. Has not been taking her HIV meds recently. CXR shows worsening pulm infiltrates. She is severely anemic requiring transfusion. Has had known CKD as far back as 2014, Creatinine was 1.66 08/2014. UA's have shown proteinuria by dipstick as far back as 2012 - never quantitated that I can tell. Creatinine on admission was 8.82 - admission UA >300 mg%, 0-5R, 0-5W. Kidneys are echodense and measure 11.5, 11.4 cm. We were asked to see because of renal failure. She does not regularly use NSAIDs. (has taken a dose of advil "once in a blue moon"). No ACE or ARB. No tenofavir containing regimens for her HIV. She has a brother on dialysis in [place] but is not aware of the cause of his renal failure.   Impression/Plan  1. AKI on CKD - obviously broad differential for her renal failure and not known if all acute vs subacute (last creatinine was in 08/2014). Possibilities include HIVAN (collapsing FSG), classic non-collapsing FSG, HIVICK (HIV associated immune complex ds), membranous, MPGN, TMA, etc etc. May be some acuity related to acute infection and volume depletion (does look dry on exam). Not on bactrim (atleast not as outpt PTA) or tenofavir containing regimens, and no NSAIDS. Most recently on valtrex, but prior on acyclovir (just not clear that she was taking that either). - Will quantitate proteinuria --> 30mg /mg which is very significant. - Complements, ANA, ANCA, serologic studies pending. - Renal biopsy would be the only way to determine etiology for certain. - She was refusing a biopsy initially but became more amenable after further discussion --> fortunately her renal function is slowly improving and hopefully this will  continue. 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 - No absolute inidications for dialysis right now.  - She has received  isotonic fluids but continues to have diarrhea. 2. Metabolic acidosis - low bicarb, normal gap - renal failure + diarrhea. With problems swallowing pills, will give some fluid as isotonic bicarb, then replete orally when able to reliably take po 3. Profound anemia - with extremely low transferrin saturation. Avoid IV iron until infection issues sorted out. Currently undergoing transfusion. R/O blood loss.  4. 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 Takes meds inconsistently. Dr. Baxter Flattery evaluating 5. Pulmonary infiltrates - ATB's per ID 6. Diarrhea - worse over the past 24hrs. 7. HSV- on renal adjusted valtrex  Subjective:   She has a nonproductive cough intermittently. Doesn't feel any worse. Denies any dsypnea, nausea, vomiting. Many more  loose BMs today. Tolerating PO's. No other complaints.   Objective:   BP 123/87 mmHg  Pulse 99  Temp(Src) 98.2 F (36.8 C) (Oral)  Resp 18  Ht 5\' 11"  (1.803 m)  Wt 62.007 kg (136 lb 11.2 oz)  BMI 19.07 kg/m2  SpO2 100%  LMP 11/16/2014  Intake/Output Summary (Last 24 hours) at 02/09/15 1449 Last data filed at 02/09/15 1352  Gross per 24 hour  Intake   3510 ml  Output    600 ml  Net   2910 ml   Weight change: 4.4 kg (9 lb 11.2 oz)  Physical Exam: Gen: Soft spoken, chronically ill appearing AAF. Appears comfortable Skin: Dry and flaky, Scars from old prior skins lesions primarily on her back but no acute looking lesions Neck: no JVD Chest:Coarse crackles bilaterally. No wheezing. Heart: Tachy low 90's S1S2 No S3 Active precordium. 1/6 murmur LSB. No diastolic murmur or rub Abdomen: soft, + BS. Mild diffuse tenderness Ext: No  edema whatsoever Neuro: alert, Ox3, no focal deficit  Imaging: No results found.  Labs: BMET  Recent Labs Lab 02/06/15 1924 02/07/15 0443 02/08/15 1015 02/09/15 0533  NA 139 137 139 139  K 4.3 3.7 3.6 3.4*  CL 108 112* 109 106  CO2 16* 15* 21* 24  GLUCOSE 120* 96 107* 92  BUN 74* 67* 58* 55*  CREATININE 8.82* 8.15* 7.82* 7.92*  CALCIUM 7.3* 6.3* 7.2* 6.5*  PHOS  --   --  5.8* 6.0*   CBC  Recent Labs Lab 02/06/15 1924 02/07/15 0443 02/07/15 1625 02/08/15 1015 02/09/15 0533  WBC 13.1* 10.7* 8.6 7.5 5.5  NEUTROABS 10.2* 8.5*  --   --   --   HGB 8.3* 6.1* 7.7* 8.1* 7.8*  HCT 25.3* 18.9* 23.8* 24.7* 23.7*  MCV 71.1* 71.1* 74.6* 74.0* 74.5*  PLT 237 185 173 228 199    Medications:    . azithromycin  1,200 mg Oral Weekly  . cefTRIAXone (ROCEPHIN)  IV  1 g Intravenous Q24H  . feeding supplement (NEPRO CARB STEADY)  237 mL Oral BID BM  . fluconazole (DIFLUCAN) IV  100 mg Intravenous Q7 days  . heparin  5,000 Units Subcutaneous 3 times per day  . megestrol  400 mg Oral Daily  . sodium chloride  3 mL Intravenous Q12H  . valACYclovir  500 mg Oral Daily      Otelia Santee, MD 02/09/2015, 2:49 PM

## 2015-02-09 NOTE — Progress Notes (Signed)
Patient ID: Paula Becker, female   DOB: Nov 30, 1975, 40 y.o.   MRN: [id number]         [facility] for Infectious [facility]    Date of Admission:  01/27/2015           Day 3 ceftriaxone  [name] is feeling a little bit better today. I would plan on approximately 4 more days of therapy for presumed pneumococcal pneumonia. She needs to be on standard prophylaxis treatment for pneumocystis, mycobacterium avium and candidiasis. I started low dose, renally adjusted trimethoprim sulfamethoxazole dosed 3 times a week for pneumocystis prophylaxis. She is already on prophylactic azithromycin and fluconazole.    Michel Bickers, MD [facility] [place] [facility] [phone] pager   [phone] cell 02/02/2015, 1:32 PM

## 2015-02-09 NOTE — Progress Notes (Signed)
Patient seen and examined. Case d/w residents in detail. I agree with findings and plan as documented in Dr. Sherlynn Carbon note.   Patient continues to improve. ID f/u and recommendations appreciated. Will c/w treatment for strep pneumoniae PNA and prophylaxis for PCP, candida and MAC.  Patient with AKI likely secondary to HIVAN. Nephro f/u appreciated. No indication for HD at this time. Biopsy deferred as patient with likely HIVAN. Will d/w ID regarding timing of resuming ART (primary treatment for HIVAN).   Hg remains stable. Will monitor. Possibly secondary to AKI.

## 2015-02-10 ENCOUNTER — Encounter (HOSPITAL_COMMUNITY): Payer: Self-pay | Admitting: Radiology

## 2015-02-10 ENCOUNTER — Inpatient Hospital Stay (HOSPITAL_COMMUNITY): Payer: Medicare Other

## 2015-02-10 DIAGNOSIS — Z9119 Patient's noncompliance with other medical treatment and regimen: Secondary | ICD-10-CM

## 2015-02-10 LAB — RENAL FUNCTION PANEL
ANION GAP: 12 (ref 5–15)
Albumin: <1 g/dL — ABNORMAL LOW (ref 3.5–5.0)
BUN: 49 mg/dL — ABNORMAL HIGH (ref 6–20)
CALCIUM: 6.7 mg/dL — AB (ref 8.9–10.3)
CHLORIDE: 111 mmol/L (ref 101–111)
CO2: 19 mmol/L — AB (ref 22–32)
CREATININE: 7.79 mg/dL — AB (ref 0.44–1.00)
GFR, EST AFRICAN AMERICAN: 7 mL/min — AB (ref >60)
GFR, EST NON AFRICAN AMERICAN: 6 mL/min — AB (ref >60)
Glucose, Bld: 126 mg/dL — ABNORMAL HIGH (ref 65–99)
Phosphorus: 5.4 mg/dL — ABNORMAL HIGH (ref 2.5–4.6)
Potassium: 3.7 mmol/L (ref 3.5–5.1)
SODIUM: 142 mmol/L (ref 135–145)

## 2015-02-10 LAB — GIARDIA/CRYPTOSPORIDIUM EIA
CRYPTOSPORIDIUM EIA: NEGATIVE
GIARDIA AG STL: NEGATIVE

## 2015-02-10 LAB — BASIC METABOLIC PANEL
ANION GAP: 10 (ref 5–15)
BUN: 49 mg/dL — ABNORMAL HIGH (ref 6–20)
CHLORIDE: 110 mmol/L (ref 101–111)
CO2: 22 mmol/L (ref 22–32)
Calcium: 6.7 mg/dL — ABNORMAL LOW (ref 8.9–10.3)
Creatinine, Ser: 7.89 mg/dL — ABNORMAL HIGH (ref 0.44–1.00)
GFR calc non Af Amer: 6 mL/min — ABNORMAL LOW (ref >60)
GFR, EST AFRICAN AMERICAN: 7 mL/min — AB (ref >60)
Glucose, Bld: 128 mg/dL — ABNORMAL HIGH (ref 65–99)
Potassium: 3.6 mmol/L (ref 3.5–5.1)
SODIUM: 142 mmol/L (ref 135–145)

## 2015-02-10 LAB — CBC
HCT: 23.8 % — ABNORMAL LOW (ref 36.0–46.0)
HEMOGLOBIN: 7.7 g/dL — AB (ref 12.0–15.0)
MCH: 24.6 pg — AB (ref 26.0–34.0)
MCHC: 32.4 g/dL (ref 30.0–36.0)
MCV: 76 fL — AB (ref 78.0–100.0)
PLATELETS: 233 10*3/uL (ref 150–400)
RBC: 3.13 MIL/uL — AB (ref 3.87–5.11)
RDW: 19.5 % — ABNORMAL HIGH (ref 11.5–15.5)
WBC: 7.4 10*3/uL (ref 4.0–10.5)

## 2015-02-10 LAB — EPSTEIN BARR VRS(EBV DNA BY PCR)
EBV DNA QN BY PCR: NEGATIVE {copies}/mL
log10 EBV DNA Qn PCR: UNDETERMINED log10 copy/mL

## 2015-02-10 LAB — MAGNESIUM: MAGNESIUM: 1.5 mg/dL — AB (ref 1.7–2.4)

## 2015-02-10 MED ORDER — ALUM & MAG HYDROXIDE-SIMETH 200-200-20 MG/5ML PO SUSP: 30.0000 mL | Freq: Four times a day (QID) | ORAL | Status: DC | PRN

## 2015-02-10 MED ORDER — PANTOPRAZOLE SODIUM 40 MG IV SOLR
40.0000 mg | Freq: Two times a day (BID) | INTRAVENOUS | Status: DC
  Administered 2015-02-10 (×2): 40 mg via INTRAVENOUS
  Filled 2015-02-10: qty 40

## 2015-02-10 NOTE — Progress Notes (Signed)
Subjective:  No acute events overnight. Lying in bed when examined- says she is doing better- increased appetite- able to take PO pills.  Later on, during team rounds, patient had complaints of abdominal "cramping pain". And had episode of diarrhea which did not relieve the abd pain. We ordered abd xray which was unremarkable- shows a lot of gas so ordered maalox     Objective: Vital signs in last 24 hours: Filed Vitals:   02/09/15 1342 02/09/15 2218 02/10/15 0430 02/10/15 0941  BP: 123/87 122/75 124/85 120/81  Pulse: 99 80 89 59  Temp: 98.2 F (36.8 C) 98.1 F (36.7 C) 98.5 F (36.9 C) 98.5 F (36.9 C)  TempSrc: Oral Oral Oral Oral  Resp: 18 19 20 18   Height:      Weight:  136 lb 14.5 oz (62.1 kg)    SpO2: 100% 99% 97% 98%   Weight change: 3.3 oz (0.093 kg)  Intake/Output Summary (Last 24 hours) at 02/10/15 1331 Last data filed at 02/10/15 0900  Gross per 24 hour  Intake    720 ml  Output   1000 ml  Net   -280 ml   General: Vital signs reviewed. thin looking pt lying in bed Cardiovascular: regular rate, rhythm, no murmur appreciated  Pulmonary/Chest: Clear to auscultation bilaterally, no wheezes, rales, or rhonchi. Abdominal: Soft, some ttp in lower abdomen, non-distended, BS + Skin- warm, dry  Lab Results: Results for orders placed or performed during the hospital encounter of 02/06/15 (from the past 24 hour(s))  C difficile quick scan w PCR reflex     Status: None   Collection Time: 02/09/15  2:32 PM  Result Value Ref Range   C Diff antigen NEGATIVE NEGATIVE   C Diff toxin NEGATIVE NEGATIVE   C Diff interpretation Negative for toxigenic C. difficile   Basic metabolic panel     Status: Abnormal   Collection Time: 02/10/15  5:24 AM  Result Value Ref Range   Sodium 142 135 - 145 mmol/L   Potassium 3.6 3.5 - 5.1 mmol/L   Chloride 110 101 - 111 mmol/L   CO2 22 22 - 32 mmol/L   Glucose, Bld 128 (H) 65 - 99 mg/dL   BUN 49 (H) 6 - 20 mg/dL   Creatinine, Ser  7.89 (H) 0.44 - 1.00 mg/dL   Calcium 6.7 (L) 8.9 - 10.3 mg/dL   GFR calc non Af Amer 6 (L) >60 mL/min   GFR calc Af Amer 7 (L) >60 mL/min   Anion gap 10 5 - 15  CBC     Status: Abnormal   Collection Time: 02/10/15  5:24 AM  Result Value Ref Range   WBC 7.4 4.0 - 10.5 K/uL   RBC 3.13 (L) 3.87 - 5.11 MIL/uL   Hemoglobin 7.7 (L) 12.0 - 15.0 g/dL   HCT 23.8 (L) 36.0 - 46.0 %   MCV 76.0 (L) 78.0 - 100.0 fL   MCH 24.6 (L) 26.0 - 34.0 pg   MCHC 32.4 30.0 - 36.0 g/dL   RDW 19.5 (H) 11.5 - 15.5 %   Platelets 233 150 - 400 K/uL  Renal function panel     Status: Abnormal   Collection Time: 02/10/15  5:24 AM  Result Value Ref Range   Sodium 142 135 - 145 mmol/L   Potassium 3.7 3.5 - 5.1 mmol/L   Chloride 111 101 - 111 mmol/L   CO2 19 (L) 22 - 32 mmol/L   Glucose, Bld 126 (H) 65 -  99 mg/dL   BUN 49 (H) 6 - 20 mg/dL   Creatinine, Ser 7.79 (H) 0.44 - 1.00 mg/dL   Calcium 6.7 (L) 8.9 - 10.3 mg/dL   Phosphorus 5.4 (H) 2.5 - 4.6 mg/dL   Albumin <1.0 (L) 3.5 - 5.0 g/dL   GFR calc non Af Amer 6 (L) >60 mL/min   GFR calc Af Amer 7 (L) >60 mL/min   Anion gap 12 5 - 15  Magnesium     Status: Abnormal   Collection Time: 02/10/15  5:24 AM  Result Value Ref Range   Magnesium 1.5 (L) 1.7 - 2.4 mg/dL    Micro Results: Recent Results (from the past 240 hour(s))  Blood culture (routine x 2)     Status: None (Preliminary result)   Collection Time: 02/06/15  8:08 PM  Result Value Ref Range Status   Specimen Description BLOOD LEFT ARM  Final   Special Requests BOTTLES DRAWN AEROBIC AND ANAEROBIC 5CC  Final   Culture NO GROWTH 4 DAYS  Final   Report Status PENDING  Incomplete  Blood culture (routine x 2)     Status: None (Preliminary result)   Collection Time: 02/06/15  8:21 PM  Result Value Ref Range Status   Specimen Description BLOOD LEFT ARM  Final   Special Requests BOTTLES DRAWN AEROBIC AND ANAEROBIC 5CC  Final   Culture NO GROWTH 4 DAYS  Final   Report Status PENDING  Incomplete    Gastrointestinal Panel by PCR , Stool     Status: None   Collection Time: 02/07/15  2:29 PM  Result Value Ref Range Status   Campylobacter species NOT DETECTED NOT DETECTED Final   Plesimonas shigelloides NOT DETECTED NOT DETECTED Final   Salmonella species NOT DETECTED NOT DETECTED Final   Yersinia enterocolitica NOT DETECTED NOT DETECTED Final   Vibrio species NOT DETECTED NOT DETECTED Final   Vibrio cholerae NOT DETECTED NOT DETECTED Final   Enteroaggregative E coli (EAEC) NOT DETECTED NOT DETECTED Final   Enteropathogenic E coli (EPEC) NOT DETECTED NOT DETECTED Final   Enterotoxigenic E coli (ETEC) NOT DETECTED NOT DETECTED Final   Shiga like toxin producing E coli (STEC) NOT DETECTED NOT DETECTED Final   E. coli O157 NOT DETECTED NOT DETECTED Final   Shigella/Enteroinvasive E coli (EIEC) NOT DETECTED NOT DETECTED Final   Cryptosporidium NOT DETECTED NOT DETECTED Final   Cyclospora cayetanensis NOT DETECTED NOT DETECTED Final   Entamoeba histolytica NOT DETECTED NOT DETECTED Final   Giardia lamblia NOT DETECTED NOT DETECTED Final   Adenovirus F40/41 NOT DETECTED NOT DETECTED Final   Astrovirus NOT DETECTED NOT DETECTED Final   Norovirus GI/GII NOT DETECTED NOT DETECTED Final   Rotavirus A NOT DETECTED NOT DETECTED Final   Sapovirus (I, II, IV, and V) NOT DETECTED NOT DETECTED Final  Giardia/Cryptosporidium EIA     Status: None (Preliminary result)   Collection Time: 02/07/15  2:29 PM  Result Value Ref Range Status   Giardia Ag, Stl PENDING  Incomplete   Cryptosporidium EIA Negative Negative Final    Comment: (NOTE) Performed At: [facility] [address] [place], [place] [id number] Lindon Romp MD [id number]    Source of Sample STOOL  Final  C difficile quick scan w PCR reflex     Status: None   Collection Time: 02/09/15  2:32 PM  Result Value Ref Range Status   C Diff antigen NEGATIVE NEGATIVE Final   C Diff toxin NEGATIVE NEGATIVE Final   C  Diff  interpretation Negative for toxigenic C. difficile  Final   Studies/Results: Dg Abd 2 Views  2015/02/25  CLINICAL DATA:  Abdominal pain.  Anemia of chronic disease. EXAM: ABDOMEN - 2 VIEW COMPARISON:  None. FINDINGS: There is mild gaseous distension of the small bowel loops. There is gas noted throughout the colon up to the rectum. No fluid levels identified. IMPRESSION: 1. No evidence for high-grade bowel obstruction. 2. Mild gaseous distension of the small bowel loops. Electronically Signed   By: Kerby Moors M.D.   On: 2015-02-25 11:46   Medications: I have reviewed the patient's current medications. Scheduled Meds: . azithromycin  1,200 mg Oral Weekly  . cefTRIAXone (ROCEPHIN)  IV  1 g Intravenous Q24H  . feeding supplement (NEPRO CARB STEADY)  237 mL Oral BID BM  . fluconazole (DIFLUCAN) IV  100 mg Intravenous Q7 days  . heparin  5,000 Units Subcutaneous 3 times per day  . megestrol  400 mg Oral Daily  . pantoprazole (PROTONIX) IV  40 mg Intravenous Q12H  . sodium chloride  3 mL Intravenous Q12H  . sulfamethoxazole-trimethoprim  1 tablet Oral Once per day on Mon Wed Fri  . valACYclovir  500 mg Oral Daily   Continuous Infusions: . sodium chloride Stopped (02/09/15 0000)  . 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 Teaff is a 40 yo female with HIV (last CD4 count 60 in Aug 2016), HSV, and recurrent MRSA furuncles, presenting with generalized weakness and diarrhea.   Community  acquired pneumonia in the setting of AIDS with long term HAART noncompliance: Chronically ill female with HIV/AIDS, presenting with tachycardia, 2-3 weeks cough productive of green sputum and diffuse interstitial pulmonary infiltrates on CXR.Given her severe immunosuppression, the differential remains broad, including viral, bacterial, or fungal PNA.  Repeat Chest xray shows worsening bibasilar infiltrates. Being followed by ID and Renal. Her urine was positive for strep pneumoniae antigen, negative for legionella. CMV negative. She has been afebrile and her vitals have been stable throughout. cdif and cryptosporidium was negative.   -consulted ID-  Will stop ceftriaxone per ID after tomorrow.  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  Normal Cr baseline 1.5-1.6, now presenting with Cr 8.82. Etiology unclear and likely multifactorial in the setting of decreased PO intake, sepsis, and medication. However, patient reports not having taken any of her medications for > 1 week.Given her dislike of taking pills, we think the non-compliance might have been long-term. Her bicarb improved but renal function did not improve after receiving 2 days of IV hydration. She also exhibited heavy proteinuria as her protein creatinine ratio is 30.9 which is high. Discussed with renal who think that while biopsy is required to establish the diagnosis of HIVAN/collapsing fsgs, her symptoms and heavy proteinuria make this diagnosis very  likely. Her complement levels were normal, and Hep c antibodies were normal.   ANA was negative. The most definite treatment for HIVAN is re-starting of the HAARTs, but we will ask ID to help guide the re-initiation of HAART. Steroids  are not indicated for HIVAN.  Trending creatinine 8.82>8.15-> 7.82-->7.92-->7.89 on 1/11/ 2017 Bicarb has improved -her protein creatinine ratio is 30.9 which is very high.    -restart HAART when appropriate- per ID -consulted nephrology, appreciate their recommendations  -daily BMETs    AIDS: Last CD4 count 60 in Aug 2016. Patient has long h/o medication noncompliance. She is very resistant to the idea of swallowing medication tablets. Depression likely playing a role in her weakness, malnutrition, and noncompliance.    - HAART per ID, will coordinate follow up appts in clinic  -weekly azithro and fluconazole  -bactrim prophylaxis per ID -ceftriaxone for strep pneumo  Hyperparathyroidism: PTH > 400  -renal following -ordered vitamin D level  Symptomatic microcytic anemia: Came in with hgb of <7. Very low transferrin saturation. Received 1 unit of PRBC. Her post transfusion CBC was 7.7 hgb  , 1/9 hgb 8.1. Her hemoglobin has been stable throughout.   -trend daily cbcs  -transfuse if <7 - will give IV ferraheme tomorrow    Diarrhea: Patient reports 3 loose stools while too weak to get out of bed. Given her immunosuppression, infectious etiologies are definitely possible.  GI panel was all negative. cdiff and cryptosporidium studies were negative.  -possibly due to recent azithromycin use   Weakness and malnutrition in the setting of AIDS  : Likely chronic due to deconditioning and acutely worsened in setting of infection. Patient admits she sleeps all day in bed and has severe protein calorie malnutrition. She denies weight loss or night sweats concerning for underlying malignancy.  -megace suspension 400 mg daily in an attempt to boost appetite - PT/OT/Nutrition   HSV: Patient has active lesions on gluteal cleft, but is also in acute renal failure. Will renally dose Valtrex  - Valacyclovir 500 mg daily, renally dosed per pharm     Dispo: Disposition is deferred at  this time, awaiting improvement of current medical problems.  Anticipated discharge in approximately 2 day(s).   The patient does have a current PCP Campbell Riches, MD) and does need an [facility] hospital follow-up appointment after discharge.  The patient does not have transportation limitations that hinder transportation to clinic appointments.  .Services Needed at time of discharge: Y = Yes, Blank = No PT:   OT:   RN:   Equipment:   Other:     LOS: 4 days   Burgess Estelle, MD 02/10/2015, 1:31 PM

## 2015-02-10 NOTE — Progress Notes (Addendum)
Patient ID: Paula Becker, female   DOB: May 22, 1975, 40 y.o.   MRN: [id number]         [facility]    Date of Admission:  01/27/2015   Total days of antibiotics 4         She states that she is sleepy but feeling better. Her cough is improved and she is not bringing up any sputum. I feel it is okay to stop ceftriaxone after tomorrow's dose. I would like to continue her current prophylactic antibiotics. I discussed this with Dr. Augustin Coupe. If she has any further deterioration in her renal status I would have a low threshold for changing trimethoprim sulfamethoxazole to prophylactic dapsone. I asked her again why she stopped taking her antiretroviral medications recently. She again reiterated that she lost her appetite. She is still living with her father. She tells me that her father use to help her with her medications but he has stopped doing that. She is not sure why. She has had a long history of problems with adherence. I will make sure that she has an appointment to be seen in our clinic soon for continued adherence counseling and support. I will sign off now.         Michel Bickers, MD [facility] [place] [facility] [phone] pager   [phone] cell 02/02/2015, 1:32 PM

## 2015-02-10 NOTE — Progress Notes (Signed)
[facility] Progress Note    Assessment/ Plan:   40 y.o. year-old with uncontrolled HIV (CD4 60 08/2014; multidrug resistance d/t poor adherence), recurrent MRSA furuncles, HSV. Admitted with progressive weakness, fatigue, productive cough, loose stools.. Has not been taking her HIV meds recently. CXR shows worsening pulm infiltrates. She is severely anemic requiring transfusion. Has had known CKD as far back as 2014, Creatinine was 1.66 08/2014. UA's have shown proteinuria by dipstick as far back as 2012 - never quantitated that I can tell. Creatinine on admission was 8.82 - admission UA >300 mg%, 0-5R, 0-5W. Kidneys are echodense and measure 11.5, 11.4 cm. We were asked to see because of renal failure. She does not regularly use NSAIDs. (has taken a dose of advil "once in a blue moon"). No ACE or ARB. No tenofavir containing regimens for her HIV. She has a brother on dialysis in [place] but is not aware of the cause of his renal failure.   1. AKI on CKD - obviously broad differential for her renal failure and not known if all acute vs subacute (last creatinine was in 08/2014). Possibilities include HIVAN (collapsing FSG), classic non-collapsing FSG, HIVICK (HIV associated immune complex ds), membranous, MPGN, TMA, etc etc. May be some acuity related to acute infection and volume depletion (does look dry on exam). Not on bactrim (atleast not as outpt PTA) or tenofavir containing regimens, and no NSAIDS. Most recently on valtrex, but prior on acyclovir (just not clear that she was taking that either). - Will quantitate proteinuria --> 30mg /mg which is very significant --> ~28g of proteinuria extrapolated to 24hrs. - Complements, ANA, ANCA, serologic studies pending. - Renal biopsy would be the only way to determine etiology for certain. - She was refusing a biopsy initially but became more amenable after further discussion --> fortunately her renal function is slowly improving and  hopefully this will continue.  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  - No absolute inidications for dialysis right now.   - D/w Dr. Megan Salon and low dose Bactrim is the best prophylaxis for PCP. We will continue MWF regimen and follow closely. If renal function continues to improve then will cont Bactrim o/w will need alternative with cr bump. Only other consideration is whether the acute component of her renal failure is from interstitial nephritis of which Bactrim can be a culprit. But if her renal function is improving the IN is less likely.  2. Metabolic acidosis - low bicarb, normal gap - renal failure + diarrhea. With problems swallowing pills, will give some fluid as isotonic bicarb, then replete orally when able to reliably take po  2. Profound anemia - with extremely low transferrin saturation. Avoid IV iron until infection issues sorted out. Currently undergoing transfusion. R/O blood loss.  3. 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 Takes meds inconsistently. Dr. Baxter Flattery evaluating 4. Pulmonary infiltrates - ATB's per ID 5. Diarrhea - worse over the past 24hrs. 6. HSV- on renal adjusted valtrex  Subjective:   She has a nonproductive cough intermittently. Doesn't feel any worse. Denies any dsypnea, nausea, vomiting. Feeling better overall. Tolerating PO's. No other complaints.   Objective:   BP 120/81 mmHg  Pulse 59  Temp(Src) 98.5 F (36.9 C) (Oral)  Resp 18  Ht 5\' 11"  (  1.803 m)  Wt 62.1 kg (136 lb 14.5 oz)  BMI 19.10 kg/m2  SpO2 98%  LMP 11/16/2014  Intake/Output Summary (Last 24 hours) at 02/10/15 1206 Last data filed at 02/10/15 0900  Gross per 24 hour  Intake    720 ml  Output   1000 ml  Net   -280 ml   Weight change: 0.093 kg (3.3 oz)  Physical Exam: Gen: Soft  spoken, chronically ill appearing AAF. Appears comfortable Skin: Dry and flaky, Scars from old prior skins lesions primarily on her back but no acute looking lesions Neck: no JVD Chest:Coarse crackles bilaterally. No wheezing. Heart: Tachy low 90's S1S2 No S3 Active precordium. 1/6 murmur LSB. No diastolic murmur or rub Abdomen: soft, + BS. Mild diffuse tenderness Ext: No edema whatsoever Neuro: alert, Ox3, no focal deficit  Imaging: Dg Abd 2 Views  02/10/2015  CLINICAL DATA:  Abdominal pain.  Anemia of chronic disease. EXAM: ABDOMEN - 2 VIEW COMPARISON:  None. FINDINGS: There is mild gaseous distension of the small bowel loops. There is gas noted throughout the colon up to the rectum. No fluid levels identified. IMPRESSION: 1. No evidence for high-grade bowel obstruction. 2. Mild gaseous distension of the small bowel loops. Electronically Signed   By: Kerby Moors M.D.   On: 02/10/2015 11:46    Labs: BMET  Recent Labs Lab 02/06/15 1924 02/07/15 0443 02/08/15 1015 02/09/15 0533 02/10/15 0524  NA 139 137 139 139 142  142  K 4.3 3.7 3.6 3.4* 3.7  3.6  CL 108 112* 109 106 111  110  CO2 16* 15* 21* 24 19*  22  GLUCOSE 120* 96 107* 92 126*  128*  BUN 74* 67* 58* 55* 49*  49*  CREATININE 8.82* 8.15* 7.82* 7.92* 7.79*  7.89*  CALCIUM 7.3* 6.3* 7.2* 6.5* 6.7*  6.7*  PHOS  --   --  5.8* 6.0* 5.4*   CBC  Recent Labs Lab 02/06/15 1924 02/07/15 0443 02/07/15 1625 02/08/15 1015 02/09/15 0533 02/10/15 0524  WBC 13.1* 10.7* 8.6 7.5 5.5 7.4  NEUTROABS 10.2* 8.5*  --   --   --   --   HGB 8.3* 6.1* 7.7* 8.1* 7.8* 7.7*  HCT 25.3* 18.9* 23.8* 24.7* 23.7* 23.8*  MCV 71.1* 71.1* 74.6* 74.0* 74.5* 76.0*  PLT 237 185 173 228 199 233    Medications:    . azithromycin  1,200 mg Oral Weekly  . cefTRIAXone (ROCEPHIN)  IV  1 g Intravenous Q24H  . feeding supplement (NEPRO CARB STEADY)  237 mL Oral BID BM  . fluconazole (DIFLUCAN) IV  100 mg Intravenous Q7 days  . heparin  5,000  Units Subcutaneous 3 times per day  . megestrol  400 mg Oral Daily  . pantoprazole (PROTONIX) IV  40 mg Intravenous Q12H  . sodium chloride  3 mL Intravenous Q12H  . sulfamethoxazole-trimethoprim  1 tablet Oral Once per day on Mon Wed Fri  . valACYclovir  500 mg Oral Daily      Otelia Santee, MD 02/10/2015, 12:06 PM

## 2015-02-10 NOTE — Progress Notes (Signed)
Patient seen and examined. Case d/w residents in detail. I agree with findings and plan as documented in Dr. Sherlynn Carbon note.  Patient complains of abd pain and diarrhea today but overall improved. I suspect diarrhea may be secondary to medications likely azithromycin. Abd x ray is unremarkable. PNA is resolving. ID f/u and recommendations appreciated. Will stop ceftriaxone tomorrow. C/w prophylactic abx per ID. Consider ART per ID.  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 Start ART per ID. Will monitor BMP. No indication for HD. Nephro recommendations appreciated.

## 2015-02-10 NOTE — Care Management Important Message (Signed)
Important Message  Patient Details  Name: Paula Becker MRN: [id number] Date of Birth: May 23, 1975   Medicare Important Message Given:  Yes    Berdie Malter P Shadeed Colberg 02/10/2015, 12:36 PM

## 2015-02-10 NOTE — Progress Notes (Signed)
PT Cancellation Note  Patient Details Name: Paula Becker MRN: [id number] DOB: [birth year]   Cancelled Treatment:    Reason Eval/Treat Not Completed:  (deferred due to having diarrhea real bad.). PT to return as able to progress independence with mobility for safe transition home.   Kingsley Callander 02/10/2015, 3:26 PM   Kittie Plater, PT, DPT Pager #: [phone] Office #: [phone]

## 2015-02-11 DIAGNOSIS — R531 Weakness: Secondary | ICD-10-CM

## 2015-02-11 LAB — GLUCOSE 6 PHOSPHATE DEHYDROGENASE
G6PDH: 10.2 U/g{Hb} (ref 4.6–13.5)
HEMOGLOBIN: 7.9 g/dL — AB (ref 11.1–15.9)

## 2015-02-11 LAB — MRSA PCR SCREENING: MRSA by PCR: NEGATIVE

## 2015-02-11 LAB — CULTURE, BLOOD (ROUTINE X 2)
CULTURE: NO GROWTH
Culture: NO GROWTH

## 2015-02-11 LAB — VITAMIN D 25 HYDROXY (VIT D DEFICIENCY, FRACTURES): Vit D, 25-Hydroxy: 5.1 ng/mL — ABNORMAL LOW (ref 30.0–100.0)

## 2015-02-11 MED ORDER — FLUCONAZOLE 100 MG PO TABS: 100.0000 mg | ORAL_TABLET | Freq: Every day | ORAL | Status: AC

## 2015-02-11 MED ORDER — ALUM & MAG HYDROXIDE-SIMETH 200-200-20 MG/5ML PO SUSP: 30.0000 mL | Freq: Four times a day (QID) | ORAL | Status: DC | PRN

## 2015-02-11 MED ORDER — PANTOPRAZOLE SODIUM 40 MG PO TBEC: 40.0000 mg | DELAYED_RELEASE_TABLET | Freq: Two times a day (BID) | ORAL | Status: DC

## 2015-02-11 MED ORDER — AZITHROMYCIN 600 MG PO TABS: ORAL_TABLET | ORAL | Status: AC

## 2015-02-11 MED ORDER — SULFAMETHOXAZOLE-TRIMETHOPRIM 400-80 MG PO TABS: 1.0000 | ORAL_TABLET | ORAL | Status: AC

## 2015-02-11 MED ORDER — VALACYCLOVIR HCL 500 MG PO TABS
500.0000 mg | ORAL_TABLET | Freq: Every day | ORAL | Status: DC
Start: 2015-02-12 — End: 2015-08-09

## 2015-02-11 MED ORDER — FLUCONAZOLE 100 MG PO TABS: 100.0000 mg | ORAL_TABLET | Freq: Every day | ORAL | Status: DC

## 2015-02-11 NOTE — Progress Notes (Signed)
[facility] Progress Note    Assessment/ Plan:   40 y.o. year-old with uncontrolled HIV (CD4 60 08/2014; multidrug resistance d/t poor adherence), recurrent MRSA furuncles, HSV. Admitted with progressive weakness, fatigue, productive cough, loose stools.. Has not been taking her HIV meds recently. CXR shows worsening pulm infiltrates. She is severely anemic requiring transfusion. Has had known CKD as far back as 2014, Creatinine was 1.66 08/2014. UA's have shown proteinuria by dipstick as far back as 2012 - never quantitated that I can tell. Creatinine on admission was 8.82 - admission UA >300 mg%, 0-5R, 0-5W. Kidneys are echodense and measure 11.5, 11.4 cm. We were asked to see because of renal failure. She does not regularly use NSAIDs. (has taken a dose of advil "once in a blue moon"). No ACE or ARB. No tenofavir containing regimens for her HIV. She has a brother on dialysis in [place] but is not aware of the cause of his renal failure.   1. AKI on CKD - obviously broad differential for her renal failure and not known if all acute vs subacute (last creatinine was in 08/2014). Possibilities include HIVAN (collapsing FSG), classic non-collapsing FSG, HIVICK (HIV associated immune complex ds), membranous, MPGN, TMA, etc etc. May be some acuity related to acute infection and volume depletion (does look dry on exam). Not on bactrim (atleast not as outpt PTA) or tenofavir containing regimens, and no NSAIDS. Most recently on valtrex, but prior on acyclovir (just not clear that she was taking that either). - Will quantitate proteinuria --> 30mg /mg which is very significant --> ~28g of proteinuria extrapolated to 24hrs. - Complements, ANA, ANCA, serologic studies pending. - Renal biopsy would be the only way to determine etiology for certain. - She was refusing a biopsy initially but became more amenable after further discussion --> she's going back and forth (declining at this  time).  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  - I again educated and counseled her today about a biopsy if she wanted to know what was the disease etiology in the kidneys and to determine how much of it has scarred down already. She again declined a biopsy. I also explained to her that she will probably be on dialysis within the next 6 months if her renal function does not return. Collapsing FSGS can be VERY aggressive especially in the setting of a noncompliant patient.  - Much less likely to be interstitial nephritis from the Bactrim as she was not even taking the Bactrim for well over a month; she states she ran out of it.  - I also don't think it's wrong to have VVS place an access in her but she declines at this time; she has a very poor understanding of the disease process. I also spent time explaining the typical routine for a chronic dialysis patient.  - No absolute inidications for dialysis right now.   - D/w Dr. Megan Salon and low dose Bactrim is the best prophylaxis for PCP. We will continue MWF regimen and follow closely. If renal function continues to improve then will cont Bactrim o/w will need alternative with cr bump. Only other consideration is whether the acute component of her renal failure is from interstitial nephritis of which Bactrim can be a culprit. But if her  renal function is improving the IN is less likely.  2. Metabolic acidosis - low bicarb, normal gap - renal failure + diarrhea. With problems swallowing pills, will give some fluid as isotonic bicarb, then replete orally when able to reliably take po  2. Profound anemia - with extremely low transferrin saturation. Avoid IV iron until infection issues sorted out. Currently undergoing transfusion. R/O blood loss.   3. 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 Takes meds inconsistently. Dr. Baxter Flattery evaluating 4. Pulmonary infiltrates - ATB's per ID 5. Diarrhea - worse over the past 24hrs. 6. HSV- on renal adjusted valtrex  Subjective:   She has a nonproductive cough intermittently. Doesn't feel any worse. Denies any dsypnea, nausea, vomiting. Feeling better overall. Tolerating PO's. No other complaints.   Objective:   BP 143/89 mmHg  Pulse 81  Temp(Src) 97.3 F (36.3 C) (Oral)  Resp 18  Ht 5\' 11"  (1.803 m)  Wt 63.3 kg (139 lb 8.8 oz)  BMI 19.47 kg/m2  SpO2 100%  LMP 11/16/2014  Intake/Output Summary (Last 24 hours) at 02/11/15 1456 Last data filed at 02/11/15 0900  Gross per 24 hour  Intake    120 ml  Output    550 ml  Net   -430 ml   Weight change: 1.2 kg (2 lb 10.3 oz)  Physical Exam: Gen: Soft spoken, chronically ill appearing AAF. Appears comfortable Skin: Dry and flaky, Scars from old prior skins lesions primarily on her back but no acute looking lesions Neck: no JVD Chest:Coarse crackles bilaterally. No wheezing. Heart: Tachy low 90's S1S2 No S3 Active precordium. 1/6 murmur LSB. No diastolic murmur or rub Abdomen: soft, + BS. Mild diffuse tenderness Ext: No edema whatsoever Neuro: alert, Ox3, no focal deficit  Imaging: Dg Abd 2 Views  02/10/2015  CLINICAL DATA:  Abdominal pain.  Anemia of chronic disease. EXAM: ABDOMEN - 2 VIEW COMPARISON:  None. FINDINGS: There is mild gaseous distension of the small bowel loops. There is gas noted throughout the colon up to the rectum. No fluid levels identified. IMPRESSION: 1. No evidence for high-grade bowel obstruction. 2. Mild gaseous distension of the small bowel loops. Electronically Signed   By: Kerby Moors M.D.   On: 02/10/2015 11:46    Labs: BMET  Recent Labs Lab 02/06/15 1924 02/07/15 0443 02/08/15 1015 02/09/15 0533 02/10/15 0524  NA 139 137 139 139 142  142  K 4.3 3.7 3.6 3.4* 3.7  3.6  CL 108 112* 109 106 111  110  CO2  16* 15* 21* 24 19*  22  GLUCOSE 120* 96 107* 92 126*  128*  BUN 74* 67* 58* 55* 49*  49*  CREATININE 8.82* 8.15* 7.82* 7.92* 7.79*  7.89*  CALCIUM 7.3* 6.3* 7.2* 6.5* 6.7*  6.7*  PHOS  --   --  5.8* 6.0* 5.4*   CBC  Recent Labs Lab 02/06/15 1924 02/07/15 0443 02/07/15 1625 02/08/15 1015 02/09/15 0533 02/10/15 0524  WBC 13.1* 10.7* 8.6 7.5 5.5 7.4  NEUTROABS 10.2* 8.5*  --   --   --   --   HGB 8.3* 6.1* 7.7* 8.1* 7.8* 7.7*  HCT 25.3* 18.9* 23.8* 24.7* 23.7* 23.8*  MCV 71.1* 71.1* 74.6* 74.0* 74.5* 76.0*  PLT 237 185 173 228 199 233    Medications:    . azithromycin  1,200 mg Oral Weekly  . cefTRIAXone (ROCEPHIN)  IV  1 g Intravenous Q24H  . feeding supplement (NEPRO CARB STEADY)  237 mL Oral BID BM  . fluconazole  100 mg Oral Daily  . heparin  5,000 Units Subcutaneous 3 times per day  . megestrol  400 mg Oral Daily  . pantoprazole  40 mg Oral BID  . sodium chloride  3 mL Intravenous Q12H  . sulfamethoxazole-trimethoprim  1 tablet Oral Once per day on Mon Wed Fri  . valACYclovir  500 mg Oral Daily      Otelia Santee, MD 02/11/2015, 2:56 PM

## 2015-02-11 NOTE — Progress Notes (Signed)
Pt refused lad draw again this morning. MD notified.

## 2015-02-11 NOTE — Progress Notes (Signed)
Patient refused lab draw this am. Yacine Garriga Joselita, RN 

## 2015-02-11 NOTE — Progress Notes (Signed)
PT Cancellation Note  Patient Details Name: [facility] MRN: [id number] DOB: 08/07/1975   Cancelled Treatment:    Reason Eval/Treat Not Completed: Patient declined, no reason specified Refused PT again today. "I just don't feel like it." Obviously agitated by our efforts to help her. Will attempt one more time however if she refuses a 3rd time in a row, per departmental guidelines, pt will be d/c from PT services.   [name] 02/11/2015, 2:25 PM Camille Bal [place], [place]

## 2015-02-11 NOTE — Discharge Summary (Signed)
Name: Paula Becker MRN: [id number] DOB: 28-May-1975 40 y.o. PCP: Campbell Riches, MD  Date of Admission: 02/06/2015  7:27 PM Date of Discharge: 02/11/2015 Attending Physician: Aldine Contes, MD  Discharge Diagnosis: 1. 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[place])   Acute renal failure (ARF) (HCC)   HCAP (healthcare-associated pneumonia)   Malnutrition of moderate degree  Discharge Medications:   Medication List    STOP taking these medications        acyclovir 400 MG tablet  Commonly known as:  ZOVIRAX     acyclovir ointment 5 %  Commonly known as:  ZOVIRAX     darunavir-cobicistat 800-150 MG tablet  Commonly known as:  PREZCOBIX     dolutegravir 50 MG tablet  Commonly known as:  TIVICAY     ibuprofen 800 MG tablet  Commonly known as:  ADVIL,MOTRIN     metroNIDAZOLE 500 MG tablet  Commonly known as:  FLAGYL     mirtazapine 15 MG tablet  Commonly known as:  REMERON     oxyCODONE 5 MG immediate release tablet  Commonly known as:  ROXICODONE      TAKE these medications        alum & mag hydroxide-simeth 200-200-20 MG/5ML suspension  Commonly known as:  MAALOX/MYLANTA  Take 30 mLs by mouth every 6 (six) hours as needed for indigestion, heartburn or flatulence.     azithromycin 600 MG tablet  Commonly known as:  ZITHROMAX  Take 2 tablets (1200 mg) by mouth every Tuesday for 11 more weeks  Start taking on:  02/16/2015     fluconazole 100 MG tablet  Commonly known as:  DIFLUCAN  Take 1 tablet (100 mg total) by mouth daily.     gabapentin 300 MG capsule  Commonly known as:  NEURONTIN  TAKE 1 CAPSULE BY MOUTH THREE TIMES DAILY     hydrOXYzine 25 MG tablet  Commonly known as:   ATARAX/VISTARIL  Take 1 tablet (25 mg total) by mouth every 6 (six) hours.     sulfamethoxazole-trimethoprim 400-80 MG tablet  Commonly known as:  BACTRIM,SEPTRA  Take 1 tablet by mouth 3 (three) times a week. For 3 weeks  Start taking on:  02/12/2015     valACYclovir 500 MG tablet  Commonly known as:  VALTREX  Take 1 tablet (500 mg total) by mouth daily.  Start taking on:  02/12/2015        Disposition and follow-up:   Ms.Paula Becker was discharged from [facility] in Stable condition.  At the hospital follow up visit please address:  1.  CAP: has her cough resolved? AIDS: She will need to re-initiate HAART therapy ID  Medication compliance- pt has extremely poor med compliance- Need to ensure she is taking her diflucan, z-pak, and bactrim and ofcourse her HAART which ID will monitor  Microcytic anemia: pt needs repeat CBC to ensure hgb is stable, possibly iron supplementation HIVAN- pt needs repeat BMET  Hyper PTH-needs further work up  Low vitamin D- extremely low vitamin D- needs vitamin D 50,000 IU for 8 weeks   2.  Labs / imaging needed at time of follow-up: CBC, BMET  3.  Pending  labs/ test needing follow-up:   Follow-up Appointments:     Follow-up Information    Go to Bobby Rumpf, MD.   Specialty:  Infectious Diseases   Why:  Dr. Johnnye Sima and Dr Hale Bogus office will cally ou to make an appointment- I have spoken with Dr. Megan Salon and he would like you to follow up within 1 week    Contact information:   [address] [place] [address] [phone]       Follow up with Lucious Groves, DO. Go on 02/19/2015.   Specialty:  Internal Medicine   Why:  3:15 pm with your new PCP   Contact information:   [address]  [place] [address] [phone]       Discharge Instructions: Discharge Instructions    Diet - low sodium heart healthy    Complete by:  As directed      Discharge instructions    Complete by:  As  directed   Please follow up with our infectious disease doctor. Please take the azithromycin once a week for 3 months, take fluconazole daily until you see your infectious disease doctor, and take take the bactrim 3 times a week for 3 weeks.  Please make sure to follow up with the infectious disease doctor- he will prescribe the HIV medicines- it is very important for you to take the HIV medicines  Some antibiotics may cause stomach upset- please take the maalox solution as needed, and eat yogurt.     Increase activity slowly    Complete by:  As directed            Consultations: Treatment Team:  Jamal Maes, MD  Procedures Performed:  X-ray Chest Pa And Lateral  02/07/2015  CLINICAL DATA:  Pneumocystis carinii pneumonia.  Fever. EXAM: CHEST  2 VIEW COMPARISON:  02/06/2015 FINDINGS: The cardiac silhouette is within normal limits in size for AP technique. Ill-defined diffuse interstitial opacities are again seen with more confluent opacities in both lung bases. Right basilar opacities have increased from yesterday's study. Left basilar opacities also appear minimally worse. No sizable pleural effusion or pneumothorax is identified. IMPRESSION: Increasing bibasilar opacities concerning for acute infection. Electronically Signed   By: Logan Bores M.D.   On: 02/07/2015 10:11   US Renal  02/07/2015  CLINICAL DATA:  Patient with acute renal failure.  History HIV. EXAM: RENAL / URINARY TRACT ULTRASOUND COMPLETE COMPARISON:  None. FINDINGS: Right Kidney: Length: 11.5 cm. Markedly increased renal cortical echogenicity. No hydronephrosis. Left Kidney: Length: 11.4 cm. Markedly increased renal cortical echogenicity. No hydronephrosis. Bladder: Appears normal for degree of bladder distention. IMPRESSION: No hydronephrosis. Markedly increased renal cortical echogenicity compatible with medical renal disease. Electronically Signed   By: Lovey Newcomer M.D.   On: 02/07/2015 08:45   Dg Chest Port 1  View  02/06/2015  CLINICAL DATA:  Weakness.  Cough.  History of HIV. EXAM: PORTABLE CHEST 1 VIEW COMPARISON:  06/28/2011. FINDINGS: There are ill-defined interstitial densities bilaterally most evident in the left lower lung. Similar densities have been present on multiple prior chest radiographs. Lungs are hyperexpanded.  No pleural effusion or pneumothorax. Cardiac silhouette is normal in size. No mediastinal or hilar masses or convincing adenopathy. Bony thorax is intact. IMPRESSION: 1. Ill-defined interstitial opacities most evident in the left lower lung. Similar densities have been present on multiple prior studies. Findings may all be chronic. However, acute interstitial infection, most notably involving the left lower lobe, should be considered likely  given the patient's history. 2. No focal consolidation is seen to suggest lobar pneumonia. No pulmonary edema. Electronically Signed   By: Lajean Manes M.D.   On: 02/06/2015 20:55   Dg Abd 2 Views  02/10/2015  CLINICAL DATA:  Abdominal pain.  Anemia of chronic disease. EXAM: ABDOMEN - 2 VIEW COMPARISON:  None. FINDINGS: There is mild gaseous distension of the small bowel loops. There is gas noted throughout the colon up to the rectum. No fluid levels identified. IMPRESSION: 1. No evidence for high-grade bowel obstruction. 2. Mild gaseous distension of the small bowel loops. Electronically Signed   By: Kerby Moors M.D.   On: 02/10/2015 11:46    2D Echo:  Cardiac Cath:   Admission HPI:  Ms. Mcpeters is a 40 yo female with HIV (last CD4 count 60 in Aug 2016), HSV, and recurrent MRSA furuncles, presenting with generalized weakness and diarrhea. Patient states she has had leg weakness for about the last month, as well as knee pain, sometimes causing her legs to give out. She has felt so weak, that she has been too tired to go to the bathroom, instead having 3 loose stools on herself over the last day. She denies numbness or tingling of the legs. She  denies voluminous watery stool, hematochezia, or melena. At baseline, she states she's normally lying down at home and sleeping all day. She says she does not do much at home. She performs her own ADLs, but relies on her family (father, son, daughter-in-law) to perform IADLs.   She reports having a cough for the last 2-3 weeks, productive of green sputum. However, she normally has a similar cough every few days. She states the current cough is about normal. She has had a HA for the last 2 days. She denies fever, sore throat, congestion, or SOB. She has had chills, runny nose, and muscle pain.  She has also had decreased PO intake for the last week 2/2 decreased appetite. She says she is eating her normal amount, though she does eat less than she should and loses her appetite quickly. She then does not take her ART meds because they cause abdominal pain if she has not eaten. She has not taken her ART for the last week. She is currently having mild abdominal pain, and will sometimes gag, but denies nausea or vomiting. She denies weight loss of night sweats. She denies depression.  She denies dysuria or hematuria. She only takes Ibuprofen one tablet once in a blue moon. She has not been taking her Acyclovir or Valtrex for about a month.   In the ED, she was tachycardic to 120s, satting 100% on RA. WBC elevated to 13 and Cr of 8.82. CXR showed chronic interstitial densities bilaterally, most notable in left lower lobe, but no lobar opacities. She received one dose of Vanc, Zosyn, Meropenem, and Bactrim.   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 Being followed by ID and Renal. Her urine was positive for strep pneumoniae antigen, negative for legionella. CMV negative. She has been afebrile and her vitals have been stable throughout. cdif and cryptosporidium was negative. She finished a course of ceftriaxone and she improved considerably. Initially she was being treated for PCP but with her strep pneumo antigen positive, we changed to PCP prophylaxis only and decreased the bactrim to 3 times a week, renally dosed.  For opportunistic infections, she was also given weekly azithromycin and fluconazole. ID will reinitiate HAART therapy per outpatient.   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 Normal Cr baseline 1.5-1.6, now presenting with Cr 8.82. Etiology unclear and likely multifactorial in the setting of decreased PO intake, sepsis, and medication. However, patient reports not having taken any of her medications for > 1 week.Given her dislike of taking pills, we think the non-compliance might have been long-term. Her bicarb improved but renal function did not improve after receiving 2 days of IV hydration. She also exhibited heavy proteinuria as her protein creatinine ratio is 30.9 which is high. Discussed with renal who think that while biopsy is required to establish the diagnosis of HIVAN/collapsing fsgs, her symptoms and heavy proteinuria make this diagnosis very likely. Her complement levels were normal, and Hep c antibodies were normal. ANA was negative. The most definite treatment for HIVAN is re-starting of the HAARTs, but we  will ask ID to help guide the re-initiation of HAART. Steroids are not indicated for HIVAN.  Nephrology advised the pt that if she wanted to know the cause of her renal deterioration, then she will have to undergo a biopsy but pt did not want a biopsy. They also educated her on importance of taking HAART meds. They do not think her renal deterioration was from the use of bactrim. It would also be fine for bactrim prophylaxis three times a week.    AIDS: Last CD4 count 60 in Aug 2016. Patient has long h/o medication noncompliance. She is very resistant to the idea of swallowing medication tablets. Depression likely playing a role in her weakness, malnutrition, and noncompliance. She will restart HAART in ID clinic. She is on fluconazole, bactrim, and azithromycin for opportunistic infections.  Hyperparathyroidism: PTH > 400. VitamiN D was low  Symptomatic microcytic anemia: Came in with hgb of <7. Very low transferrin saturation. Received 1 unit of PRBC. Her post transfusion CBC was 7.7 hgb , 1/9 hgb 8.1. Her hemoglobin has been stable throughout. Plan will be to recheck CBC in outpatient appt.   Diarrhea: Patient reports 3 loose stools while too weak to get out of bed. Given her immunosuppression, infectious etiologies are definitely possible. GI panel was all negative. cdiff and cryptosporidium studies were negative. possibly due to recent azithromycin use. Her diarrhea resolved and she felt better after receiving maalox solution.   Weakness and malnutrition in the setting of AIDS : Likely chronic due to deconditioning and acutely worsened in setting of infection. Patient admits she sleeps all day in bed and has severe protein calorie malnutrition. She denies weight loss or night sweats concerning for underlying malignancy. We arranged home health PT.  HSV: Patient has active lesions on gluteal cleft, but is also in acute renal failure.   Valacyclovir 500 mg daily, renally dosed per  pharm She was refusing valtrex. She will discuss at the ID visit.  Discharge Vitals:   BP 143/89 mmHg  Pulse 81  Temp(Src) 97.3 F (36.3 C) (Oral)  Resp 18  Ht 5\' 11"  (1.803 m)  Wt 139 lb 8.8 oz (63.3 kg)  BMI 19.47 kg/m2  SpO2 100%  LMP 11/16/2014  Discharge Labs:  Results for orders placed or performed during the hospital encounter of 02/06/15 (from the past 24 hour(s))  MRSA PCR Screening     Status: None   Collection Time: 02/11/15  9:45 AM  Result Value Ref Range   MRSA by PCR NEGATIVE NEGATIVE    Signed: Burgess Estelle, MD 02/11/2015, 4:09 PM    Services Ordered on Discharge:  Equipment Ordered on Discharge:

## 2015-02-11 NOTE — Discharge Instructions (Addendum)
Please follow up with our infectious disease doctor. Please take the azithromycin once a week for 3 months, take fluconazole daily until you see your infectious disease doctor, and take take the bactrim 3 times a week for 3 weeks.  Please make sure to follow up with the infectious disease doctor- he will prescribe the HIV medicines- it is very important for you to take the HIV medicines  Some antibiotics may cause stomach upset- please take the maalox solution as needed, and eat yogurt.   Please follow up with your new PCP next Friday

## 2015-02-11 NOTE — Progress Notes (Signed)
Subjective:  No acute events overnight. Lying in bed when examined- says she is doing better- She denies any abdominal pain and says that the pain yesterday had resolved after the maalox solution.  Overall she feels better.   Objective: Vital signs in last 24 hours: Filed Vitals:   02/10/15 2053 02/11/15 0500 02/11/15 0950 02/11/15 1758  BP: 121/83 132/86 143/89 135/85  Pulse: 72 71 81 83  Temp: 97.8 F (36.6 C) 97.4 F (36.3 C) 97.3 F (36.3 C) 98 F (36.7 C)  TempSrc: Oral Oral Oral Oral  Resp: 18 16 18 18   Height:      Weight: 139 lb 8.8 oz (63.3 kg)     SpO2: 99% 100% 100% 100%   Weight change: 2 lb 10.3 oz (1.2 kg)  Intake/Output Summary (Last 24 hours) at 02/11/15 1820 Last data filed at 02/11/15 1700  Gross per 24 hour  Intake    600 ml  Output    550 ml  Net     50 ml   General: Vital signs reviewed. thin looking pt lying in bed Cardiovascular: regular rate, rhythm, no murmur appreciated  Pulmonary/Chest: Clear to auscultation bilaterally, no wheezes, rales, or rhonchi. Abdominal: Soft, some ttp in lower abdomen, non-distended, BS + Skin- warm, dry  Lab Results: Results for orders placed or performed during the hospital encounter of 02/06/15 (from the past 24 hour(s))  MRSA PCR Screening     Status: None   Collection Time: 02/11/15  9:45 AM  Result Value Ref Range   MRSA by PCR NEGATIVE NEGATIVE    Micro Results: Recent Results (from the past 240 hour(s))  Blood culture (routine x 2)     Status: None   Collection Time: 02/06/15  8:08 PM  Result Value Ref Range Status   Specimen Description BLOOD LEFT ARM  Final   Special Requests BOTTLES DRAWN AEROBIC AND ANAEROBIC 5CC  Final   Culture NO GROWTH 5 DAYS  Final   Report Status 02/11/2015 FINAL  Final  Blood culture (routine x 2)     Status: None   Collection Time: 02/06/15  8:21 PM  Result Value Ref Range Status   Specimen Description BLOOD LEFT ARM  Final   Special Requests BOTTLES DRAWN AEROBIC  AND ANAEROBIC 5CC  Final   Culture NO GROWTH 5 DAYS  Final   Report Status 02/11/2015 FINAL  Final  Gastrointestinal Panel by PCR , Stool     Status: None   Collection Time: 02/07/15  2:29 PM  Result Value Ref Range Status   Campylobacter species NOT DETECTED NOT DETECTED Final   Plesimonas shigelloides NOT DETECTED NOT DETECTED Final   Salmonella species NOT DETECTED NOT DETECTED Final   Yersinia enterocolitica NOT DETECTED NOT DETECTED Final   Vibrio species NOT DETECTED NOT DETECTED Final   Vibrio cholerae NOT DETECTED NOT DETECTED Final   Enteroaggregative E coli (EAEC) NOT DETECTED NOT DETECTED Final   Enteropathogenic E coli (EPEC) NOT DETECTED NOT DETECTED Final   Enterotoxigenic E coli (ETEC) NOT DETECTED NOT DETECTED Final   Shiga like toxin producing E coli (STEC) NOT DETECTED NOT DETECTED Final   E. coli O157 NOT DETECTED NOT DETECTED Final   Shigella/Enteroinvasive E coli (EIEC) NOT DETECTED NOT DETECTED Final   Cryptosporidium NOT DETECTED NOT DETECTED Final   Cyclospora cayetanensis NOT DETECTED NOT DETECTED Final   Entamoeba histolytica NOT DETECTED NOT DETECTED Final   Giardia lamblia NOT DETECTED NOT DETECTED Final   Adenovirus F40/41 NOT  DETECTED NOT DETECTED Final   Astrovirus NOT DETECTED NOT DETECTED Final   Norovirus GI/GII NOT DETECTED NOT DETECTED Final   Rotavirus A NOT DETECTED NOT DETECTED Final   Sapovirus (I, II, IV, and V) NOT DETECTED NOT DETECTED Final  Giardia/Cryptosporidium EIA     Status: None   Collection Time: 02/07/15  2:29 PM  Result Value Ref Range Status   Giardia Ag, Stl Negative Negative Final    Comment: (NOTE) Performed At: [facility] [address] [place], [place] [id number] Lindon Romp MD [id number]    Cryptosporidium EIA Negative Negative Final    Comment: (NOTE) Performed At: [facility] [address] [place], [place] [id number] Lindon Romp MD [id number]    Source of Sample STOOL  Final    C difficile quick scan w PCR reflex     Status: None   Collection Time: 02/09/15  2:32 PM  Result Value Ref Range Status   C Diff antigen NEGATIVE NEGATIVE Final   C Diff toxin NEGATIVE NEGATIVE Final   C Diff interpretation Negative for toxigenic C. difficile  Final  MRSA PCR Screening     Status: None   Collection Time: 02/11/15  9:45 AM  Result Value Ref Range Status   MRSA by PCR NEGATIVE NEGATIVE Final    Comment:        The GeneXpert MRSA Assay (FDA approved for NASAL specimens only), is one component of a comprehensive MRSA colonization surveillance program. It is not intended to diagnose MRSA infection nor to guide or monitor treatment for MRSA infections.    Studies/Results: Dg Abd 2 Views  02/12/15  CLINICAL DATA:  Abdominal pain.  Anemia of chronic disease. EXAM: ABDOMEN - 2 VIEW COMPARISON:  None. FINDINGS: There is mild gaseous distension of the small bowel loops. There is gas noted throughout the colon up to the rectum. No fluid levels identified. IMPRESSION: 1. No evidence for high-grade bowel obstruction. 2. Mild gaseous distension of the small bowel loops. Electronically Signed   By: Kerby Moors M.D.   On: 02-12-2015 11:46   Medications: I have reviewed the patient's current medications. Scheduled Meds: . azithromycin  1,200 mg Oral Weekly  . cefTRIAXone (ROCEPHIN)  IV  1 g Intravenous Q24H  . feeding supplement (NEPRO CARB STEADY)  237 mL Oral BID BM  . fluconazole  100 mg Oral Daily  . heparin  5,000 Units Subcutaneous 3 times per day  . megestrol  400 mg Oral Daily  . pantoprazole  40 mg Oral BID  . sodium chloride  3 mL Intravenous Q12H  . sulfamethoxazole-trimethoprim  1 tablet Oral Once per day on Mon Wed Fri  . valACYclovir  500 mg Oral Daily   Continuous Infusions: . sodium chloride Stopped (02/09/15 0000)  . 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 Edmon is a 40 yo female with HIV (last CD4 count 60 in Aug 2016), HSV, and recurrent MRSA furuncles, presenting with generalized weakness and diarrhea.   Community  acquired pneumonia in the setting of AIDS with long term HAART noncompliance: Chronically ill female with HIV/AIDS, presenting with tachycardia, 2-3 weeks cough productive of green sputum and diffuse interstitial pulmonary infiltrates on CXR.Repeat Chest xray showed worsening bibasilar infiltrates. Being followed by ID and Renal. Her urine was positive for strep pneumoniae antigen, negative for legionella. CMV negative. She has been afebrile and her vitals have been stable throughout. cdif and cryptosporidium was negative.   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  Normal Cr baseline 1.5-1.6, now presenting with Cr 8.82. Etiology unclear and likely multifactorial in the setting of decreased PO intake, sepsis, and medication. However, patient reports not having taken any of her medications for > 1 week.Given her dislike of taking pills, we think the non-compliance might have been long-term. Her bicarb improved but renal function did not improve after receiving 2 days of IV  hydration. She also exhibited heavy proteinuria as her protein creatinine ratio is 30.9 which is high. Discussed with renal who think that while biopsy is required to establish the diagnosis of HIVAN/collapsing fsgs, her symptoms and heavy proteinuria make this diagnosis very likely. Her complement levels were normal, and Hep c antibodies were normal.   ANA was negative. The most definite treatment for HIVAN is re-starting of the HAARTs, but we will ask ID to help guide the re-initiation of HAART. Steroids are not indicated for HIVAN.   Nephrology advised the pt today that if she wanted to know the cause of her renal deterioration, then she will have to undergo a biopsy but pt did not want a biopsy. They also educated her on importance of taking HAART meds. They do not think her renal deterioration was from the use of bactrim. It would also be fine for bactrim prophylaxis three times a week.   -Pt to be discharged today -Outpt follow up with PCP scheduled in our office, and also with ID  AIDS: Last CD4 count 60 in Aug 2016. Patient has long h/o medication noncompliance. She is very resistant to the idea of swallowing medication tablets. Depression likely playing a role in her weakness, malnutrition, and noncompliance.    - HAART per ID in follow up appts in clinic  -weekly azithro and fluconazole  -bactrim prophylaxis three times a week  Hyperparathyroidism: PTH > 400  -renal following -ordered vitamin D level- will mention it in discharge summary for outpatient treatment   Symptomatic microcytic anemia: Came in with hgb of <7. Very low transferrin saturation. Received 1 unit of PRBC. Her post transfusion CBC was 7.7 hgb  , 1/9 hgb 8.1. Her hemoglobin has been stable throughout.   -pt refused cbc today - check cbc at PCP appt next week , also can prescribe iron supplementation   Diarrhea: Patient reports 3 loose stools while too weak to get out of bed. Given her immunosuppression,  infectious etiologies are definitely possible.  GI panel was all negative. cdiff and cryptosporidium studies were negative. possibly due to recent azithromycin use   - no diarrhea since yesterday  Weakness and malnutrition in the setting of AIDS  : Likely chronic due to deconditioning and acutely worsened in setting of infection. Patient admits she sleeps all  day in bed and has severe protein calorie malnutrition. She denies weight loss or night sweats concerning for underlying malignancy.  -arranged home health PT  HSV: Patient has active lesions on gluteal cleft, but is also in acute renal failure. Will renally dose Valtrex  - Valacyclovir 500 mg daily, renally dosed per pharm   -pt has been refusing valtrex -per ID  Dispo: Disposition is deferred at this time, awaiting improvement of current medical problems.  Anticipated discharge in approximately 2 day(s).   The patient does have a current PCP Campbell Riches, MD) and does need an [facility] hospital follow-up appointment after discharge.  The patient does not have transportation limitations that hinder transportation to clinic appointments.  .Services Needed at time of discharge: Y = Yes, Blank = No PT:   OT:   RN:   Equipment:   Other:     LOS: 5 days   Burgess Estelle, MD 02/11/2015, 6:20 PM

## 2015-02-11 NOTE — Progress Notes (Signed)
Patient seen and examined. Case d/w residents in detail. I agree with findings and plan as documented in Dr. Sherlynn Carbon note.  Patient is much improved. Abd pain has resolved. No diarrhea. Tolerating PO. Abx completed. Patient stable for d/c home today  Will f/u ID to restart her ART for HIV. C/w proph for MAC, PCP and candida.  AKI likely secondary to HIVAN. Will need close outpatient f/u with nephro. No need for urgent HD at this point.

## 2015-02-12 LAB — CRYPTOSPORIDIUM/ISOSPORA SMEAR
Cryptosporidium Smear,Stool: NEGATIVE
Isospora Smear, Stool: NEGATIVE

## 2015-02-18 ENCOUNTER — Telehealth: Payer: Self-pay | Admitting: Internal Medicine

## 2015-02-18 NOTE — Telephone Encounter (Signed)
Call to patient to confirm appointment for 02/18/14 at 3:15 lmtcb

## 2015-02-19 ENCOUNTER — Ambulatory Visit: Payer: Medicare Other | Admitting: Infectious Diseases

## 2015-02-19 ENCOUNTER — Ambulatory Visit: Payer: Medicare Other | Admitting: Internal Medicine

## 2015-02-19 ENCOUNTER — Other Ambulatory Visit: Payer: Self-pay

## 2015-02-19 ENCOUNTER — Other Ambulatory Visit: Payer: Self-pay | Admitting: Infectious Diseases

## 2015-02-19 DIAGNOSIS — Z1231 Encounter for screening mammogram for malignant neoplasm of breast: Secondary | ICD-10-CM

## 2015-03-08 ENCOUNTER — Telehealth: Payer: Self-pay | Admitting: *Deleted

## 2015-03-08 NOTE — Telephone Encounter (Signed)
Patient and her father called to advise she has had vomiting and diarrhea for the past 3-4 days. She can not even get out of the bed to go to the restroom and is having stools all over herself, the bed and the floor. She is very weak and hurts all over, has not been able to eat or drink for the past 2 days. Advised them with the noro virus going around she would need to be seen at the ED asap. She may be dehydrated and if it is the norovirus there is nothing we can do in the office. They advised they understand and will take her to the ED today.

## 2015-03-10 ENCOUNTER — Emergency Department (HOSPITAL_COMMUNITY): Payer: Medicare Other

## 2015-03-10 ENCOUNTER — Telehealth: Payer: Self-pay | Admitting: *Deleted

## 2015-03-10 ENCOUNTER — Encounter (HOSPITAL_COMMUNITY): Payer: Self-pay | Admitting: *Deleted

## 2015-03-10 ENCOUNTER — Inpatient Hospital Stay (HOSPITAL_COMMUNITY)
Admission: EM | Admit: 2015-03-10 | Discharge: 2015-03-18 | DRG: 969 | Disposition: A | Discharge status: Home / Self Care | Payer: Medicare Other | Attending: Internal Medicine | Admitting: Internal Medicine

## 2015-03-10 DIAGNOSIS — N032 Chronic nephritic syndrome with diffuse membranous glomerulonephritis: Secondary | ICD-10-CM | POA: Diagnosis present

## 2015-03-10 DIAGNOSIS — J189 Pneumonia, unspecified organism: Secondary | ICD-10-CM

## 2015-03-10 DIAGNOSIS — N269 Renal sclerosis, unspecified: Secondary | ICD-10-CM | POA: Diagnosis present

## 2015-03-10 DIAGNOSIS — E872 Acidosis: Secondary | ICD-10-CM | POA: Diagnosis present

## 2015-03-10 DIAGNOSIS — A609 Anogenital herpesviral infection, unspecified: Secondary | ICD-10-CM | POA: Diagnosis present

## 2015-03-10 DIAGNOSIS — R197 Diarrhea, unspecified: Secondary | ICD-10-CM | POA: Diagnosis present

## 2015-03-10 DIAGNOSIS — E43 Unspecified severe protein-calorie malnutrition: Secondary | ICD-10-CM | POA: Diagnosis present

## 2015-03-10 DIAGNOSIS — B20 Human immunodeficiency virus [HIV] disease: Secondary | ICD-10-CM | POA: Diagnosis present

## 2015-03-10 DIAGNOSIS — R053 Chronic cough: Secondary | ICD-10-CM | POA: Diagnosis present

## 2015-03-10 DIAGNOSIS — R05 Cough: Secondary | ICD-10-CM | POA: Diagnosis present

## 2015-03-10 DIAGNOSIS — D631 Anemia in chronic kidney disease: Secondary | ICD-10-CM | POA: Diagnosis present

## 2015-03-10 DIAGNOSIS — Z79899 Other long term (current) drug therapy: Secondary | ICD-10-CM | POA: Diagnosis not present

## 2015-03-10 DIAGNOSIS — N186 End stage renal disease: Secondary | ICD-10-CM | POA: Diagnosis present

## 2015-03-10 DIAGNOSIS — N189 Chronic kidney disease, unspecified: Secondary | ICD-10-CM

## 2015-03-10 DIAGNOSIS — Z992 Dependence on renal dialysis: Secondary | ICD-10-CM | POA: Diagnosis not present

## 2015-03-10 DIAGNOSIS — F329 Major depressive disorder, single episode, unspecified: Secondary | ICD-10-CM | POA: Diagnosis present

## 2015-03-10 DIAGNOSIS — E559 Vitamin D deficiency, unspecified: Secondary | ICD-10-CM | POA: Diagnosis present

## 2015-03-10 DIAGNOSIS — E876 Hypokalemia: Secondary | ICD-10-CM | POA: Diagnosis present

## 2015-03-10 DIAGNOSIS — Z95828 Presence of other vascular implants and grafts: Secondary | ICD-10-CM

## 2015-03-10 DIAGNOSIS — N83209 Unspecified ovarian cyst, unspecified side: Secondary | ICD-10-CM

## 2015-03-10 DIAGNOSIS — N19 Unspecified kidney failure: Secondary | ICD-10-CM | POA: Diagnosis present

## 2015-03-10 DIAGNOSIS — N184 Chronic kidney disease, stage 4 (severe): Secondary | ICD-10-CM | POA: Diagnosis not present

## 2015-03-10 DIAGNOSIS — N183 Chronic kidney disease, stage 3 (moderate): Secondary | ICD-10-CM | POA: Diagnosis not present

## 2015-03-10 DIAGNOSIS — E440 Moderate protein-calorie malnutrition: Secondary | ICD-10-CM | POA: Diagnosis present

## 2015-03-10 DIAGNOSIS — Z452 Encounter for adjustment and management of vascular access device: Secondary | ICD-10-CM

## 2015-03-10 DIAGNOSIS — N179 Acute kidney failure, unspecified: Secondary | ICD-10-CM | POA: Diagnosis present

## 2015-03-10 DIAGNOSIS — Z9114 Patient's other noncompliance with medication regimen: Secondary | ICD-10-CM | POA: Diagnosis not present

## 2015-03-10 DIAGNOSIS — R64 Cachexia: Secondary | ICD-10-CM | POA: Diagnosis present

## 2015-03-10 DIAGNOSIS — D649 Anemia, unspecified: Secondary | ICD-10-CM

## 2015-03-10 DIAGNOSIS — N051 Unspecified nephritic syndrome with focal and segmental glomerular lesions: Secondary | ICD-10-CM | POA: Diagnosis present

## 2015-03-10 DIAGNOSIS — Z91013 Allergy to seafood: Secondary | ICD-10-CM | POA: Diagnosis not present

## 2015-03-10 DIAGNOSIS — B009 Herpesviral infection, unspecified: Secondary | ICD-10-CM | POA: Diagnosis present

## 2015-03-10 DIAGNOSIS — Z881 Allergy status to other antibiotic agents status: Secondary | ICD-10-CM

## 2015-03-10 DIAGNOSIS — N2581 Secondary hyperparathyroidism of renal origin: Secondary | ICD-10-CM | POA: Diagnosis present

## 2015-03-10 DIAGNOSIS — Z681 Body mass index (BMI) 19 or less, adult: Secondary | ICD-10-CM | POA: Diagnosis not present

## 2015-03-10 DIAGNOSIS — R918 Other nonspecific abnormal finding of lung field: Secondary | ICD-10-CM | POA: Diagnosis not present

## 2015-03-10 DIAGNOSIS — F0631 Mood disorder due to known physiological condition with depressive features: Secondary | ICD-10-CM | POA: Diagnosis present

## 2015-03-10 DIAGNOSIS — Z8249 Family history of ischemic heart disease and other diseases of the circulatory system: Secondary | ICD-10-CM | POA: Diagnosis not present

## 2015-03-10 DIAGNOSIS — D638 Anemia in other chronic diseases classified elsewhere: Secondary | ICD-10-CM | POA: Diagnosis present

## 2015-03-10 DIAGNOSIS — R109 Unspecified abdominal pain: Secondary | ICD-10-CM | POA: Diagnosis present

## 2015-03-10 DIAGNOSIS — R112 Nausea with vomiting, unspecified: Secondary | ICD-10-CM | POA: Diagnosis not present

## 2015-03-10 DIAGNOSIS — Z419 Encounter for procedure for purposes other than remedying health state, unspecified: Secondary | ICD-10-CM

## 2015-03-10 DIAGNOSIS — L8990 Pressure ulcer of unspecified site, unspecified stage: Secondary | ICD-10-CM | POA: Insufficient documentation

## 2015-03-10 DIAGNOSIS — R1030 Lower abdominal pain, unspecified: Secondary | ICD-10-CM | POA: Diagnosis not present

## 2015-03-10 DIAGNOSIS — J9811 Atelectasis: Secondary | ICD-10-CM | POA: Diagnosis not present

## 2015-03-10 LAB — I-STAT VENOUS BLOOD GAS, ED
Acid-base deficit: 17 mmol/L — ABNORMAL HIGH (ref 0.0–2.0)
Bicarbonate: 9.7 mEq/L — ABNORMAL LOW (ref 20.0–24.0)
O2 SAT: 31 %
PCO2 VEN: 24.4 mmHg — AB (ref 45.0–50.0)
PH VEN: 7.207 — AB (ref 7.250–7.300)
PO2 VEN: 23 mmHg — AB (ref 30.0–45.0)
Patient temperature: 98.5
TCO2: 10 mmol/L (ref 0–100)

## 2015-03-10 LAB — COMPREHENSIVE METABOLIC PANEL
ALK PHOS: 112 U/L (ref 38–126)
ALT: 8 U/L — AB (ref 14–54)
ANION GAP: 17 — AB (ref 5–15)
AST: 14 U/L — AB (ref 15–41)
BILIRUBIN TOTAL: 0.4 mg/dL (ref 0.3–1.2)
BUN: 99 mg/dL — AB (ref 6–20)
CALCIUM: 6.5 mg/dL — AB (ref 8.9–10.3)
CO2: 9 mmol/L — AB (ref 22–32)
Chloride: 110 mmol/L (ref 101–111)
Creatinine, Ser: 16.37 mg/dL — ABNORMAL HIGH (ref 0.44–1.00)
GFR calc Af Amer: 3 mL/min — ABNORMAL LOW (ref >60)
GFR calc non Af Amer: 2 mL/min — ABNORMAL LOW (ref >60)
GLUCOSE: 145 mg/dL — AB (ref 65–99)
Potassium: 4.2 mmol/L (ref 3.5–5.1)
SODIUM: 136 mmol/L (ref 135–145)
TOTAL PROTEIN: 6.6 g/dL (ref 6.5–8.1)

## 2015-03-10 LAB — I-STAT CG4 LACTIC ACID, ED
Lactic Acid, Venous: 1.84 mmol/L (ref 0.5–2.0)
Lactic Acid, Venous: 2.03 mmol/L (ref 0.5–2.0)

## 2015-03-10 LAB — PREPARE RBC (CROSSMATCH)

## 2015-03-10 LAB — POC OCCULT BLOOD, ED: Fecal Occult Bld: POSITIVE — AB

## 2015-03-10 LAB — CBC
HEMATOCRIT: 17.6 % — AB (ref 36.0–46.0)
HEMOGLOBIN: 5.6 g/dL — AB (ref 12.0–15.0)
MCH: 23.1 pg — AB (ref 26.0–34.0)
MCHC: 31.8 g/dL (ref 30.0–36.0)
MCV: 72.7 fL — ABNORMAL LOW (ref 78.0–100.0)
Platelets: 332 10*3/uL (ref 150–400)
RBC: 2.42 MIL/uL — ABNORMAL LOW (ref 3.87–5.11)
RDW: 18.7 % — AB (ref 11.5–15.5)
WBC: 11.4 10*3/uL — ABNORMAL HIGH (ref 4.0–10.5)

## 2015-03-10 LAB — I-STAT BETA HCG BLOOD, ED (MC, WL, AP ONLY)

## 2015-03-10 LAB — LIPASE, BLOOD: Lipase: 67 U/L — ABNORMAL HIGH (ref 11–51)

## 2015-03-10 MED ORDER — STERILE WATER FOR INJECTION IV SOLN
INTRAVENOUS | Status: DC
  Administered 2015-03-10: 19:00:00 via INTRAVENOUS
  Filled 2015-03-10 (×2): qty 850

## 2015-03-10 MED ORDER — LIDOCAINE HCL (PF) 1 % IJ SOLN
5.0000 mL | INTRAMUSCULAR | Status: DC | PRN
  Filled 2015-03-10: qty 5

## 2015-03-10 MED ORDER — LEVOFLOXACIN IN D5W 500 MG/100ML IV SOLN: 500.0000 mg | INTRAVENOUS | Status: DC

## 2015-03-10 MED ORDER — ONDANSETRON HCL 4 MG PO TABS: 4.0000 mg | ORAL_TABLET | Freq: Four times a day (QID) | ORAL | Status: DC | PRN

## 2015-03-10 MED ORDER — PENTAFLUOROPROP-TETRAFLUOROETH EX AERO
1.0000 "application " | INHALATION_SPRAY | CUTANEOUS | Status: DC | PRN
  Filled 2015-03-10: qty 30

## 2015-03-10 MED ORDER — LINEZOLID 600 MG/300ML IV SOLN
600.0000 mg | Freq: Two times a day (BID) | INTRAVENOUS | Status: DC
  Administered 2015-03-10: 600 mg via INTRAVENOUS
  Filled 2015-03-10: qty 300

## 2015-03-10 MED ORDER — DEXTROSE 5 % IV SOLN
2.0000 g | Freq: Once | INTRAVENOUS | Status: AC
  Administered 2015-03-10: 2 g via INTRAVENOUS
  Filled 2015-03-10: qty 2

## 2015-03-10 MED ORDER — LIDOCAINE-PRILOCAINE 2.5-2.5 % EX CREA
1.0000 "application " | TOPICAL_CREAM | CUTANEOUS | Status: DC | PRN
  Filled 2015-03-10: qty 5

## 2015-03-10 MED ORDER — SODIUM CHLORIDE 0.9 % IV SOLN: 100.0000 mL | INTRAVENOUS | Status: DC | PRN

## 2015-03-10 MED ORDER — HEPARIN SODIUM (PORCINE) 1000 UNIT/ML DIALYSIS: 1000.0000 [IU] | INTRAMUSCULAR | Status: DC | PRN

## 2015-03-10 MED ORDER — SODIUM CHLORIDE 0.9 % IV SOLN: Freq: Once | INTRAVENOUS | Status: DC

## 2015-03-10 MED ORDER — FLUCONAZOLE 100 MG PO TABS
100.0000 mg | ORAL_TABLET | Freq: Every day | ORAL | Status: DC
  Administered 2015-03-11 – 2015-03-18 (×2): 100 mg via ORAL
  Filled 2015-03-10 (×5): qty 1

## 2015-03-10 MED ORDER — AZITHROMYCIN 600 MG PO TABS
1200.0000 mg | ORAL_TABLET | ORAL | Status: DC
  Filled 2015-03-10 (×2): qty 2

## 2015-03-10 MED ORDER — ENSURE ENLIVE PO LIQD
237.0000 mL | Freq: Three times a day (TID) | ORAL | Status: DC
  Administered 2015-03-11 (×2): 237 mL via ORAL

## 2015-03-10 MED ORDER — ACETAMINOPHEN 650 MG RE SUPP: 650.0000 mg | Freq: Four times a day (QID) | RECTAL | Status: DC | PRN

## 2015-03-10 MED ORDER — DEXTROSE 5 % IV SOLN: 1.0000 g | INTRAVENOUS | Status: DC

## 2015-03-10 MED ORDER — SULFAMETHOXAZOLE-TRIMETHOPRIM 400-80 MG PO TABS
1.0000 | ORAL_TABLET | ORAL | Status: DC
  Administered 2015-03-11: 1 via ORAL
  Filled 2015-03-10 (×5): qty 1

## 2015-03-10 MED ORDER — SODIUM CHLORIDE 0.9 % IV BOLUS (SEPSIS)
1000.0000 mL | Freq: Once | INTRAVENOUS | Status: AC
  Administered 2015-03-10: 1000 mL via INTRAVENOUS

## 2015-03-10 MED ORDER — ACETAMINOPHEN 325 MG PO TABS: 650.0000 mg | ORAL_TABLET | Freq: Four times a day (QID) | ORAL | Status: DC | PRN

## 2015-03-10 MED ORDER — ONDANSETRON HCL 4 MG/2ML IJ SOLN
4.0000 mg | Freq: Four times a day (QID) | INTRAMUSCULAR | Status: DC | PRN
  Administered 2015-03-16 – 2015-03-17 (×2): 4 mg via INTRAVENOUS
  Filled 2015-03-10 (×2): qty 2

## 2015-03-10 MED ORDER — LEVOFLOXACIN IN D5W 750 MG/150ML IV SOLN
750.0000 mg | Freq: Once | INTRAVENOUS | Status: AC
  Administered 2015-03-10: 750 mg via INTRAVENOUS
  Filled 2015-03-10: qty 150

## 2015-03-10 MED ORDER — HEPARIN SODIUM (PORCINE) 1000 UNIT/ML DIALYSIS
1000.0000 [IU] | INTRAMUSCULAR | Status: DC | PRN
  Filled 2015-03-10: qty 1

## 2015-03-10 MED ORDER — SODIUM CHLORIDE 0.9 % IV SOLN
Freq: Once | INTRAVENOUS | Status: DC
Start: 2015-03-10 — End: 2015-03-18

## 2015-03-10 MED ORDER — SODIUM CHLORIDE 0.9% FLUSH
3.0000 mL | Freq: Two times a day (BID) | INTRAVENOUS | Status: DC
  Administered 2015-03-11 – 2015-03-13 (×6): 3 mL via INTRAVENOUS
  Administered 2015-03-15 – 2015-03-16 (×2): 10 mL via INTRAVENOUS
  Administered 2015-03-17 – 2015-03-18 (×2): 3 mL via INTRAVENOUS

## 2015-03-10 MED ORDER — ALTEPLASE 2 MG IJ SOLR
2.0000 mg | Freq: Once | INTRAMUSCULAR | Status: AC | PRN
  Administered 2015-03-18: 2 mg
  Filled 2015-03-10 (×2): qty 2

## 2015-03-10 NOTE — ED Notes (Signed)
PT to go to dialysis at 1845

## 2015-03-10 NOTE — Progress Notes (Signed)
Pharmacy Progress Note:  Pharmacy consulted to adjust all of patient's medications for HD. Currently on appropriate doses. Will schedule for Bactrim to be given after HD on HD days.   Albertina Parr, PharmD., BCPS Clinical Pharmacist Pager [phone]

## 2015-03-10 NOTE — Consult Note (Addendum)
Reason for Consult:Renal failure Referring Physician: Dr. Skeet Latch Rancour  Chief Complaint: Weakness  Assessment: 1. 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 She is now frankly uremic with anorexia, nausea, decline in mental status, acidotic and will need to initiate dialysis. - Mixed acid base disturbance with gap and nongap metabolic acidosis from the advanced renal failure and diarrhea --> will give one liter of HCO3 based therapy + HD. - Appreciate CCM for being so responsive and agreeing to place the dialysis catheter. - Will slowly dialyze her with a Qb of only 259m/min tonight for 3 hrs to decrease risk of dialysis dysequilibrium. - Will also have VVS see her for a permanent dialysis access placement. - SW for a unit placement. 2. Anemia - acute on chronic --> will transfuse 2 units of PRBC, no evidence of TTP, stool occult and check an iron panel. 3. Renal osteodystrophy - will check a phos and iPTH. 4. HIV - on HAART but missed the past week because of the uremia, but had been noncompliant in the past. - she was started on low dose Bactrim by ID for PCP prophylaxis and I agreed at that time after d/w ID given the superiority of Bactrim for prophylaxis, low dose and also pt already with very advanced kidney disease.   HPI: Paula Raulersonis an 40y.o. 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 She was to follow up with [facility] but  did not make the appointment. She is now presenting with worsening fatigue, weakness, nausea, vomiting, diarrhea. She also has a nonproductive cough but denies fever, chills, myalgias. She also has chest pain with no radiation and not exertional in nature. She has not taken her HAART in at least a week and has had anorexia as well. She denies thirst, dysuria, hematuria, rashes or tarry stools.   ROS Pertinent items are noted in HPI.  Chemistry and CBC: CREAT  Date/Time Value Ref Range Status  09/30/2014 02:16 PM 1.66* 0.50 - 1.10 mg/dL Final  10/08/2012 10:58 AM 1.44* 0.50 - 1.10 mg/dL Final  06/11/2012 09:54 AM 1.47* 0.50 - 1.10 mg/dL Final  01/02/2012 11:38 AM 1.08 0.50 - 1.10 mg/dL Final  09/12/2011 12:20 PM 0.74 0.50 - 1.10 mg/dL Final  02/17/2011 12:54 PM 0.81 0.50 - 1.10 mg/dL Final  09/12/2010 03:39 PM 0.79 0.50 - 1.10 mg/dL Final  04/15/2010 12:06 PM 0.66 0.40 - 1.20 mg/dL Final   CREATININE, SER  Date/Time Value Ref Range Status  03/10/2015 02:32 PM 16.37* 0.44 - 1.00 mg/dL Final  02/10/2015 05:24 AM 7.89* 0.44 - 1.00 mg/dL Final  02/10/2015 05:24 AM 7.79* 0.44 - 1.00 mg/dL Final  02/09/2015 05:33 AM 7.92* 0.44 - 1.00 mg/dL Final  02/08/2015 10:15 AM 7.82* 0.44 - 1.00 mg/dL Final  02/07/2015 04:43 AM 8.15* 0.44 - 1.00 mg/dL Final  02/06/2015 07:24 PM 8.82* 0.44 - 1.00 mg/dL Final  06/23/2011 09:47 AM 0.97 0.50 - 1.10 mg/dL Final  08/06/2010 05:00 AM 0.68 0.50 - 1.10 mg/dL  Final    Comment:    **Please note change in reference range.**  07/31/2010 05:20 AM 0.93 0.50 - 1.10 mg/dL Final    Comment:    **Please note change in reference range.**  07/30/2010 02:46 PM 1.22* 0.50 - 1.10 mg/dL Final    Comment:    **Please note change in reference range.**  07/06/2010 05:25 AM 0.66 0.4 - 1.2 mg/dL Final  07/04/2010 08:15 PM 0.97 0.4 - 1.2 mg/dL Final  06/03/2010 06:20 AM 0.51 0.4 - 1.2 mg/dL Final  06/02/2010 05:46 AM 0.53 0.4 - 1.2 mg/dL Final  06/01/2010 06:35 AM .69 0.4 -  1.2 mg/dL Final  05/31/2010 11:17 AM 0.76 0.4 - 1.2 mg/dL Final  04/06/2010 06:06 AM 0.57 0.4 - 1.2 mg/dL Final  04/05/2010 02:43 PM 0.58 0.4 - 1.2 mg/dL Final  04/02/2010 04:25 AM 0.64 0.4 - 1.2 mg/dL Final  04/01/2010 02:49 PM 0.60 0.4 - 1.2 mg/dL Final  04/01/2010 0.57 mg/dL     Recent Labs Lab 03/10/15 1432  NA 136  K 4.2  CL 110  CO2 9*  GLUCOSE 145*  BUN 99*  CREATININE 16.37*  CALCIUM 6.5*    Recent Labs Lab 03/10/15 1432  WBC 11.4*  HGB 5.6*  HCT 17.6*  MCV 72.7*  PLT 332   Liver Function Tests:  Recent Labs Lab 03/10/15 1432  AST 14*  ALT 8*  ALKPHOS 112  BILITOT 0.4  PROT 6.6  ALBUMIN <1.0*    Recent Labs Lab 03/10/15 1432  LIPASE 67*   No results for input(s): AMMONIA in the last 168 hours. Cardiac Enzymes: No results for input(s): CKTOTAL, CKMB, CKMBINDEX, TROPONINI in the last 168 hours. Iron Studies: No results for input(s): IRON, TIBC, TRANSFERRIN, FERRITIN in the last 72 hours. PT/INR: _0 (inr:5)  Xrays/Other Studies: ) Results for orders placed or performed during the hospital encounter of 03/10/15 (from the past 48 hour(s))  I-Stat beta hCG blood, ED ([facility], [facility], [facility] only)     Status: None   Collection Time: 03/10/15  2:27 PM  Result Value Ref Range   I-stat hCG, quantitative <5.0 <5 mIU/mL   Comment 3            Comment:   GEST. AGE      CONC.  (mIU/mL)   <=1 WEEK        5 - 50     2 WEEKS       50 - 500     3 WEEKS       100 - 10,000     4 WEEKS     1,000 - 30,000        FEMALE AND NON-PREGNANT FEMALE:     LESS THAN 5 mIU/mL   Lipase, blood     Status: Abnormal   Collection Time: 03/10/15  2:32 PM  Result Value Ref Range   Lipase 67 (H) 11 - 51 U/L  Comprehensive metabolic panel     Status: Abnormal   Collection Time: 03/10/15  2:32 PM  Result Value Ref Range   Sodium 136 135 - 145 mmol/L   Potassium 4.2 3.5 - 5.1 mmol/L   Chloride 110 101 - 111 mmol/L   CO2 9 (L) 22 - 32 mmol/L   Glucose, Bld 145 (H) 65 - 99  mg/dL   BUN 99 (H) 6 - 20 mg/dL   Creatinine, Ser 16.37 (H) 0.44 - 1.00 mg/dL   Calcium 6.5 (L) 8.9 - 10.3 mg/dL   Total Protein 6.6 6.5 -  8.1 g/dL   Albumin <1.0 (L) 3.5 - 5.0 g/dL    Comment: REPEATED TO VERIFY   AST 14 (L) 15 - 41 U/L   ALT 8 (L) 14 - 54 U/L   Alkaline Phosphatase 112 38 - 126 U/L   Total Bilirubin 0.4 0.3 - 1.2 mg/dL   GFR calc non Af Amer 2 (L) >60 mL/min   GFR calc Af Amer 3 (L) >60 mL/min    Comment: (NOTE) The eGFR has been calculated using the CKD EPI equation. This calculation has not been validated in all clinical situations. eGFR's persistently <60 mL/min signify possible Chronic Kidney Disease.    Anion gap 17 (H) 5 - 15  CBC     Status: Abnormal   Collection Time: 03/10/15  2:32 PM  Result Value Ref Range   WBC 11.4 (H) 4.0 - 10.5 K/uL   RBC 2.42 (L) 3.87 - 5.11 MIL/uL   Hemoglobin 5.6 (LL) 12.0 - 15.0 g/dL    Comment: REPEATED TO VERIFY CRITICAL RESULT CALLED TO, READ BACK BY AND VERIFIED WITH: ANNA REABOLD,RN AT 1447 03/10/15 BY ZBEECH.    HCT 17.6 (L) 36.0 - 46.0 %   MCV 72.7 (L) 78.0 - 100.0 fL   MCH 23.1 (L) 26.0 - 34.0 pg   MCHC 31.8 30.0 - 36.0 g/dL   RDW 18.7 (H) 11.5 - 15.5 %   Platelets 332 150 - 400 K/uL  I-Stat venous blood gas, ED     Status: Abnormal   Collection Time: 03/10/15  5:03 PM  Result Value Ref Range   pH, Ven 7.207 (L) 7.250 - 7.300   pCO2, Ven 24.4 (L) 45.0 - 50.0 mmHg   pO2, Ven 23.0 (LL) 30.0 - 45.0 mmHg   Bicarbonate 9.7 (L) 20.0 - 24.0 mEq/L   TCO2 10 0 - 100 mmol/L   O2 Saturation 31.0 %   Acid-base deficit 17.0 (H) 0.0 - 2.0 mmol/L   Patient temperature 98.5 F    Collection site RADIAL, ALLEN'S TEST ACCEPTABLE    Drawn by RT    Sample type MIXED VENOUS SAMPLE    Comment NOTIFIED PHYSICIAN   I-Stat CG4 Lactic Acid, ED     Status: None   Collection Time: 03/10/15  5:08 PM  Result Value Ref Range   Lactic Acid, Venous 1.84 0.5 - 2.0 mmol/L  POC occult blood, ED Provider will collect     Status: Abnormal    Collection Time: 03/10/15  5:11 PM  Result Value Ref Range   Fecal Occult Bld POSITIVE (A) NEGATIVE   Dg Chest Portable 1 View  03/10/2015  CLINICAL DATA:  Weakness and cough for 3-4 days. Difficulty walking. Nausea and vomiting. Diarrhea. Lower abdominal pain. HIV. EXAM: PORTABLE CHEST 1 VIEW COMPARISON:  02/06/2014 FINDINGS: Indistinct airspace opacities pressed along both hemidiaphragms along with some questionable opacity in the right upper lung at the level of the aortic arch. The lungs appear otherwise clear. Cardiac and mediastinal margins appear normal. Reverse lordotic projection. The generalized interstitial opacity has improved compared to 02/07/15. IMPRESSION: 1. The generalized interstitial opacity shown on 02/07/2015 is improved, but there some persistent indistinct airspace opacities at both lung bases and in the right mid upper lung such that residual multilobar pneumonia is a concern. Electronically Signed   By: Van Clines M.D.   On: 03/10/2015 17:25   Dg Abd Portable 1v  03/10/2015  CLINICAL DATA:  Abdominal pain EXAM: PORTABLE ABDOMEN - 1 VIEW COMPARISON:  02/10/2015 FINDINGS: The bowel  gas pattern is normal. No radio-opaque calculi or other significant radiographic abnormality are seen. IMPRESSION: Negative. Electronically Signed   By: Kathreen Devoid   On: 03/10/2015 17:24    PMH:   Past Medical History  Diagnosis Date  . HIV (human immunodeficiency virus infection) ([place])   . MRSA (methicillin resistant staph aureus) culture positive   . Necrotizing pneumonia ([place]) 07/2010  . Pneumonia 06/23/11    RLL patchy, nodular lung disease  . Anemia of chronic disease   . History of noncompliance with medical treatment   . Herpes     PSH:   Past Surgical History  Procedure Laterality Date  . Cesarean section  1998  . Breast surgery  03/2010    right; "don't know what they did"  . Video bronchoscopy  06/28/2011    Procedure: VIDEO BRONCHOSCOPY WITH FLUORO;  Surgeon: Chesley Mires, MD;  Location: [facility];  Service: Cardiopulmonary;  Laterality: Bilateral;    Allergies:  Allergies  Allergen Reactions  . Shellfish-Derived Products Swelling    Facial swelling  . Vancomycin Rash    Medications:   Prior to Admission medications   Medication Sig Start Date End Date Taking? Authorizing Provider  alum & mag hydroxide-simeth (MAALOX/MYLANTA) 200-200-20 MG/5ML suspension Take 30 mLs by mouth every 6 (six) hours as needed for indigestion, heartburn or flatulence. Patient not taking: Reported on 03/10/2015 02/11/15   Burgess Estelle, MD  azithromycin (ZITHROMAX) 600 MG tablet Take 2 tablets (1200 mg) by mouth every Tuesday for 11 more weeks 02/16/15 04/03/15  Burgess Estelle, MD  fluconazole (DIFLUCAN) 100 MG tablet Take 100 mg by mouth daily. 02/11/15   Historical Provider, MD  gabapentin (NEURONTIN) 300 MG capsule TAKE 1 CAPSULE BY MOUTH THREE TIMES DAILY Patient not taking: Reported on 01/18/2015 12/28/14   Campbell Riches, MD  hydrOXYzine (ATARAX/VISTARIL) 25 MG tablet Take 1 tablet (25 mg total) by mouth every 6 (six) hours. Patient not taking: Reported on 01/18/2015 11/27/14   Ashley Murrain, NP  sulfamethoxazole-trimethoprim (BACTRIM,SEPTRA) 400-80 MG tablet Take 1 tablet by mouth 3 (three) times a week. For 3 weeks 02/11/15   Historical Provider, MD  valACYclovir (VALTREX) 500 MG tablet Take 1 tablet (500 mg total) by mouth daily. 02/12/15   Burgess Estelle, MD    Discontinued Meds:   Medications Discontinued During This Encounter  Medication Reason  . 0.9 %  sodium chloride infusion     Social History:  reports that she has never smoked. She has never used smokeless tobacco. She reports that she does not drink alcohol or use illicit drugs.  Family History:   Family History  Problem Relation Age of Onset  . Lung disease Mother     passed away at 64 with lung disease  . Hypertension Father     Blood pressure 116/97, pulse 116, temperature 98.5 F (36.9 C),  temperature source Oral, resp. rate 23, height 5' 11" (1.803 m), weight 63.3 kg (139 lb 8.8 oz), last menstrual period 11/16/2014, SpO2 100 %. General appearance: cooperative, distracted and slowed mentation Head: Normocephalic, without obvious abnormality, atraumatic Eyes: negative Neck: no adenopathy, no carotid bruit, no JVD, supple, symmetrical, trachea midline and thyroid not enlarged, symmetric, no tenderness/mass/nodules Back: symmetric, no curvature. ROM normal. No CVA tenderness. Resp: clear to auscultation bilaterally Chest wall: no tenderness Cardio: regular rate and rhythm, S1, S2 normal, no murmur, click, rub or gallop GI: soft, non-tender; bowel sounds normal; no masses,  no organomegaly Extremities: extremities normal, atraumatic, no cyanosis  or edema Pulses: 2+ and symmetric Skin: Skin color, texture, turgor normal. No rashes or lesions Lymph nodes: Cervical, supraclavicular, and axillary nodes normal. Neurologic: Mental status: Alert, oriented, thought content appropriate, slowed mentation   Seen on dialysis _0  03/10/2015 Dialysis through [facility] temp cath. No complaints. 3K/2.5Ca bath 128/73 and pt appears to be comfortable. No UF and QbQd 250/500 to decrease risk of dysequilibrium Plan for HD tomorrow as well. 2 units of PRBC is ready for transfusion.    Dwana Melena, MD 03/10/2015, 6:10 PM

## 2015-03-10 NOTE — Procedures (Signed)
Hemodialysis Insertion Procedure Note Lis Pennywell [id number] 1975-02-11  Procedure: Insertion of Hemodialysis Catheter Type: 3 port  Indications: Hemodialysis   Procedure Details Consent: Risks of procedure as well as the alternatives and risks of each were explained to the (patient/caregiver).  Consent for procedure obtained. Time Out: Verified patient identification, verified procedure, site/side was marked, verified correct patient position, special equipment/implants available, medications/allergies/relevent history reviewed, required imaging and test results available.  Performed  Maximum sterile technique was used including antiseptics, cap, gloves, gown, hand hygiene, mask and sheet. Skin prep: Chlorhexidine; local anesthetic administered A antimicrobial bonded/coated triple lumen catheter was placed in the right internal jugular vein using the Seldinger technique. Ultrasound guidance used.Yes.   Catheter placed to 15 cm. Blood aspirated via all 3 ports and then flushed x 3. Line sutured x 2 and dressing applied.  Evaluation Blood flow good Complications: No apparent complications Patient did tolerate procedure well. Chest X-ray ordered to verify placement.  CXR: pending.  Georgann Housekeeper, AGACNP-BC [facility] Pulmonology/Critical Care Pager [phone] or [phone]  03/10/2015 7:20 PM  [name] Tolerated well  Lavon Paganini. Titus Mould, MD, [place] Pgr: [facility]

## 2015-03-10 NOTE — ED Notes (Signed)
Georgann Housekeeper, NP finishes dialysis catheter placement at this time.

## 2015-03-10 NOTE — ED Notes (Signed)
[name] IV attempted twice with no success. Dr.Rancour made aware.

## 2015-03-10 NOTE — ED Notes (Signed)
Report given Joy, RN in dialysis.

## 2015-03-10 NOTE — ED Notes (Signed)
Pt reports lower abd pain and n/v/d x 3 days. Denies urinary symptoms.

## 2015-03-10 NOTE — ED Notes (Signed)
[facility] for dialysis [place], RN accepts report at this time.

## 2015-03-10 NOTE — ED Notes (Signed)
[facility] contacted to page Code Sepsis 

## 2015-03-10 NOTE — ED Notes (Signed)
MD at bedside. 

## 2015-03-10 NOTE — ED Notes (Signed)
Blood to be infused in dialysis per Dr. Wyvonnia Dusky.

## 2015-03-10 NOTE — H&P (Signed)
Date: 03/10/2015               Patient Name:  Paula Becker MRN: [id number]  DOB: 10-21-1975 Age / Sex: 40 y.o., female   PCP: Campbell Riches, MD         Medical Service: Internal Medicine Teaching Service         Attending Physician: Dr. Oval Linsey, MD    First Contact: Dr. Tiburcio Pea Pager: [phone]  Second Contact: Dr. 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 Symptoms have been progressively worsening during this time. Her diarrhea is nonbloody and 3 times daily frequency. Her abdominal pain is sharp in character and does not radiate. Her nausea is persistent and she is not eating or keeping down medications, with 1 episode of vomiting yesterday. She denies hematuria or vaginal discharge, and LMP was around October of last year. She also notes some intermittent chest pain and SOB and worsening of her chronic dry nonproductive cough. She denies fever but feels chills intermittently during this time.  Initial labs in the ED showing renal failure with marked metabolic acidosis. Abdominal CT showing small free fluid and R ovarian cyst otherwise no signficant acute process. She was started on bicarb infusion, RIJ HD catheter was placed at ED and patient admitted for emergent HD.  Of note Paula Becker was admitted 02/06/15 for PNA and treated while inpatient. She was in worsening acute on chronic renal failure at that time and instructed to follow ASAP with [facility] outpatient and never did so. Per ID clinic notes she has been chronically noncompliant on HIV therapy now with multidrug resistant  virus.  Meds: Current Facility-Administered Medications  Medication Dose Route Frequency Provider Last Rate Last Dose  . 0.9 %  sodium chloride infusion   Intravenous Once Ezequiel Essex, MD      . 0.9 %  sodium chloride infusion  100 mL Intravenous PRN Dwana Melena, MD      . 0.9 %  sodium chloride infusion  100 mL Intravenous PRN Dwana Melena, MD      . 0.9 %  sodium chloride infusion   Intravenous Once Juluis Mire, MD      . acetaminophen (TYLENOL) tablet 650 mg  650 mg Oral Q6H PRN Juluis Mire, MD       Or  . acetaminophen (TYLENOL) suppository 650 mg  650 mg Rectal Q6H PRN Marjan Rabbani, MD      . alteplase (CATHFLO ACTIVASE) injection 2 mg  2 mg Intracatheter Once PRN Dwana Melena, MD      . azithromycin [facility]) tablet 1,200 mg  1,200 mg Oral Weekly Marjan Rabbani, MD      . feeding supplement (ENSURE ENLIVE) (ENSURE ENLIVE) liquid 237 mL  237 mL Oral TID BM Marjan Rabbani, MD      . fluconazole (DIFLUCAN) tablet 100 mg  100 mg Oral Daily Marjan Rabbani, MD      . heparin injection 1,000 Units  1,000 Units Dialysis PRN Dwana Melena, MD      . lidocaine (PF) (XYLOCAINE) 1 % injection 5 mL  5  mL Intradermal PRN Dwana Melena, MD      . lidocaine-prilocaine (EMLA) cream 1 application  1 application Topical PRN Dwana Melena, MD      . ondansetron [facility]) tablet 4 mg  4 mg Oral Q6H PRN Juluis Mire, MD       Or  . ondansetron (ZOFRAN) injection 4 mg  4 mg Intravenous Q6H PRN Juluis Mire, MD      . pentafluoroprop-tetrafluoroeth (GEBAUERS) aerosol 1 application  1 application Topical PRN Dwana Melena, MD      . sodium chloride flush (NS) 0.9 % injection 3 mL  3 mL Intravenous Q12H Marjan Rabbani, MD      . sulfamethoxazole-trimethoprim (BACTRIM,SEPTRA) 400-80 MG per tablet 1 tablet  1 tablet Oral Once per day on Mon Wed Fri Juluis Mire, MD       Current Outpatient Prescriptions  Medication Sig Dispense Refill  . alum & mag hydroxide-simeth (MAALOX/MYLANTA) 200-200-20 MG/5ML  suspension Take 30 mLs by mouth every 6 (six) hours as needed for indigestion, heartburn or flatulence. (Patient not taking: Reported on 03/10/2015) 355 mL 0  . azithromycin (ZITHROMAX) 600 MG tablet Take 2 tablets (1200 mg) by mouth every Tuesday for 11 more weeks 24 tablet 0  . fluconazole (DIFLUCAN) 100 MG tablet Take 100 mg by mouth daily.  0  . gabapentin (NEURONTIN) 300 MG capsule TAKE 1 CAPSULE BY MOUTH THREE TIMES DAILY (Patient not taking: Reported on 01/18/2015) 60 capsule 2  . hydrOXYzine (ATARAX/VISTARIL) 25 MG tablet Take 1 tablet (25 mg total) by mouth every 6 (six) hours. (Patient not taking: Reported on 01/18/2015) 20 tablet 0  . sulfamethoxazole-trimethoprim (BACTRIM,SEPTRA) 400-80 MG tablet Take 1 tablet by mouth 3 (three) times a week. For 3 weeks  0  . valACYclovir (VALTREX) 500 MG tablet Take 1 tablet (500 mg total) by mouth daily. 21 tablet 0    Allergies: Allergies as of 03/10/2015 - Review Complete 03/10/2015  Allergen Reaction Noted  . Shellfish-derived products Swelling 06/09/2010  . Vancomycin Rash 04/01/2010   Past Medical History  Diagnosis Date  . HIV (human immunodeficiency virus infection) ([place])   . MRSA (methicillin resistant staph aureus) culture positive   . Necrotizing pneumonia ([place]) 07/2010  . Pneumonia 06/23/11    RLL patchy, nodular lung disease  . Anemia of chronic disease   . History of noncompliance with medical treatment   . Herpes    Past Surgical History  Procedure Laterality Date  . Cesarean section  1998  . Breast surgery  03/2010    right; "don't know what they did"  . Video bronchoscopy  06/28/2011    Procedure: VIDEO BRONCHOSCOPY WITH FLUORO;  Surgeon: Chesley Mires, MD;  Location: [facility];  Service: Cardiopulmonary;  Laterality: Bilateral;   Family History  Problem Relation Age of Onset  . Lung disease Mother     passed away at 56 with lung disease  . Hypertension Father    Social History   Social History  . Marital Status:  Single    Spouse Name: N/A  . Number of Children: N/A  . Years of Education: N/A   Occupational History  . Not on file.   Social History Main Topics  . Smoking status: Never Smoker   . Smokeless tobacco: Never Used  . Alcohol Use: No  . Drug Use: No  . Sexual Activity: Not on file     Comment: declined condoms   Other Topics Concern  . Not on file  Social History Narrative   Lives in [place] in her house.   Has support from her Dad for doctor visits.   Didn't work ever. Not working now.    Review of Systems: Review of Systems  Constitutional: Positive for chills and weight loss.  Eyes: Negative for blurred vision.  Respiratory: Positive for cough and shortness of breath. Negative for hemoptysis and sputum production.   Cardiovascular: Positive for chest pain.  Gastrointestinal: Positive for nausea, vomiting, abdominal pain and diarrhea.  Genitourinary: Negative for hematuria.  Musculoskeletal: Negative for falls.  Skin: Negative for rash.  Neurological: Negative for dizziness, seizures and headaches.  Endo/Heme/Allergies: Does not bruise/bleed easily.  Psychiatric/Behavioral: Negative for substance abuse.    Physical Exam: Blood pressure 134/88, pulse 79, temperature 97.4 F (36.3 C), temperature source Oral, resp. rate 13, height 5\' 11"  (1.803 m), weight 63.3 kg (139 lb 8.8 oz), last menstrual period 11/16/2014, SpO2 100 %. GENERAL- Cachectic/chronically ill appearing woman, uncomfortable, seen on dialysis HEENT- Atraumatic, PERRL, EOMI, pale mucosa and conjunctiva, no cervical LN enlargement. CARDIAC- Tachycardic, no murmurs, rubs or gallops. RESP- CTAB, no wheezes or crackles. ABDOMEN- Soft, mild tenderness, no guarding or rebound NEURO- No obvious Cr N abnormality, strength upper and lower extremities- 5/5, Sensation intact globally EXTREMITIES- waxy LE skin, nonedematous, nontender to palpation, no bruising or deformity SKIN- Warm, dry, No rash or lesion. PSYCH-  Normal mood and affect, appropriate thought content and speech.    Lab results: Basic Metabolic Panel:  Recent Labs  03/10/15 1432  NA 136  K 4.2  CL 110  CO2 9*  GLUCOSE 145*  BUN 99*  CREATININE 16.37*  CALCIUM 6.5*   Liver Function Tests:  Recent Labs  03/10/15 1432  AST 14*  ALT 8*  ALKPHOS 112  BILITOT 0.4  PROT 6.6  ALBUMIN <1.0*    Recent Labs  03/10/15 1432  LIPASE 67*   No results for input(s): AMMONIA in the last 72 hours. CBC:  Recent Labs  03/10/15 1432  WBC 11.4*  HGB 5.6*  HCT 17.6*  MCV 72.7*  PLT 332   Cardiac Enzymes: No results for input(s): CKTOTAL, CKMB, CKMBINDEX, TROPONINI in the last 72 hours. BNP: No results for input(s): PROBNP in the last 72 hours. D-Dimer: No results for input(s): DDIMER in the last 72 hours. CBG: No results for input(s): GLUCAP in the last 72 hours. Hemoglobin A1C: No results for input(s): HGBA1C in the last 72 hours. Fasting Lipid Panel: No results for input(s): CHOL, HDL, LDLCALC, TRIG, CHOLHDL, LDLDIRECT in the last 72 hours. Thyroid Function Tests: No results for input(s): TSH, T4TOTAL, FREET4, T3FREE, THYROIDAB in the last 72 hours. Anemia Panel: No results for input(s): VITAMINB12, FOLATE, FERRITIN, TIBC, IRON, RETICCTPCT in the last 72 hours. Coagulation: No results for input(s): LABPROT, INR in the last 72 hours. Urine Drug Screen: Drugs of Abuse  No results found for: LABOPIA, COCAINSCRNUR, LABBENZ, AMPHETMU, THCU, LABBARB  Alcohol Level: No results for input(s): ETH in the last 72 hours. Urinalysis: No results for input(s): COLORURINE, LABSPEC, PHURINE, GLUCOSEU, HGBUR, BILIRUBINUR, KETONESUR, PROTEINUR, UROBILINOGEN, NITRITE, LEUKOCYTESUR in the last 72 hours.  Invalid input(s): APPERANCEUR   Imaging results:  Ct Abdomen Pelvis Wo Contrast  03/10/2015  CLINICAL DATA:  Acute lower abdominal pain. EXAM: CT ABDOMEN AND PELVIS WITHOUT CONTRAST TECHNIQUE: Multidetector CT imaging of the  abdomen and pelvis was performed following the standard protocol without IV contrast. COMPARISON:  None. FINDINGS: Bronchiectasis and scarring is noted in the right middle and left  lower lobes. No significant osseous abnormality is noted. No gallstones are noted. Small right hepatic cyst is noted. The spleen and pancreas appear normal on these unenhanced images. Adrenal glands and kidneys appear normal. No hydronephrosis or renal obstruction is noted. The appendix appears normal. There is no evidence of bowel obstruction. Uterus appears normal. Left ovary appears normal. Right ovary is not well visualized. Moderate amount of free fluid is noted in the pelvis. Urinary bladder appears normal. No significant adenopathy is noted. IMPRESSION: Bronchiectasis and scarring is noted in both lung bases. Moderate amount of free fluid is noted in the pelvis. Uterus and left ovary appear normal. Right ovary is not well visualized. Pelvic ultrasound is recommended to evaluate for possible right ovarian cyst or other pathology. Electronically Signed   By: Marijo Conception, M.D.   On: 03/10/2015 19:02   Dg Chest Port 1 View  03/10/2015  CLINICAL DATA:  Encounter for central line placement. EXAM: PORTABLE CHEST 1 VIEW COMPARISON:  Same day. FINDINGS: The heart size and mediastinal contours are within normal limits. No pneumothorax is noted. Interval placement of right internal jugular catheter with distal tip in expected position of the SVC. Mild bibasilar atelectasis is noted. Mild bilateral pleural effusions are noted. The visualized skeletal structures are unremarkable. IMPRESSION: Stable mild bibasilar atelectasis with associated pleural effusions. Interval placement of right internal jugular catheter line with distal tip in expected position of the SVC. No pneumothorax is noted. Electronically Signed   By: Marijo Conception, M.D.   On: 03/10/2015 19:49   Dg Chest Portable 1 View  03/10/2015  CLINICAL DATA:  Weakness and cough  for 3-4 days. Difficulty walking. Nausea and vomiting. Diarrhea. Lower abdominal pain. HIV. EXAM: PORTABLE CHEST 1 VIEW COMPARISON:  02/06/2014 FINDINGS: Indistinct airspace opacities pressed along both hemidiaphragms along with some questionable opacity in the right upper lung at the level of the aortic arch. The lungs appear otherwise clear. Cardiac and mediastinal margins appear normal. Reverse lordotic projection. The generalized interstitial opacity has improved compared to 02/07/15. IMPRESSION: 1. The generalized interstitial opacity shown on 02/07/2015 is improved, but there some persistent indistinct airspace opacities at both lung bases and in the right mid upper lung such that residual multilobar pneumonia is a concern. Electronically Signed   By: Van Clines M.D.   On: 03/10/2015 17:25   Dg Abd Portable 1v  03/10/2015  CLINICAL DATA:  Abdominal pain EXAM: PORTABLE ABDOMEN - 1 VIEW COMPARISON:  02/10/2015 FINDINGS: The bowel gas pattern is normal. No radio-opaque calculi or other significant radiographic abnormality are seen. IMPRESSION: Negative. Electronically Signed   By: Kathreen Devoid   On: 03/10/2015 17:24    Other results: EKG: Sinus tachycardia, some movement artifact in baseline on study, no QRS widening or T wave abnormalities  Assessment & Plan by Problem: Acute on chronic renal failure Patient with presumptive FSGS mediated renal disease now in failure with uremia and metabolic acidosis. She failed to follow up with Nephrology outpatient after being discharged with a creatinine of 7.8 in January. This is almost certainly the underlying cause of her current abdominal symptoms and anemia. Volume status was low on presentation now is s/p fluid resuscitation. -Admit to stepdown unit -HD per nephrology -VVS to evaluate for likely HD access placement during admission -Discontinue bicarb infusion after HD -Will defer secondary hyperparathyroidism, phos, Ca, Vit-D management to  nephrology at this time -F/U post HD renal panel  Nausea/vomiting/diarrhea and abdominal pain 5 day history of GI upset  seeming consistent with a viral gastroenteritis. Could also just be secondary to her metabolic derangement from renal failure. She has had some chills but no fever. Code sepsis was called and fluid resuscitation given but patient did not have recorded hypotension or fever, only mild lactic acidosis and tachycardia. CT abdomen negative for colitis or other structural abnormality. R ovarian cyst and small free fluid in pelvis noted -Obtain pelvis US -Maintain adequate hydration if ongoing diarrhea  Anemia Presenting Hgb of 5.4. Does have FOBT, however some skin breakdown perianally. Denies hematochezia and her anemia is alternatively explained from her kidney disease. She has been anemic in 7-8 range since 2012 on chart review. -Tranfuse 2 units pRBCs -Limit A/C at this time -Check ferritin, coags -Likely needs supplementation per nephro  HIV/AIDS Uncontrolled, noncompliant, now resistant virology. Last CD4 40-60. Supposed to be on bactrim, azithro ppx and fluconazole for likely candidal esophagitis/ppx but almost certainly not taking. Overall her current presentation does NOT look like acute infection needing more than ppx abtx. Plans per ID to restart HAART therapy after her last discharge but no record of this starting -Consult ID for recs and coordination of care  Chronic cough Dry persistent cough patient has had since last admission for pneumonia last month. She was discharged with only 2 days remaining of Rx to complete outpt. She does experience mild dyspnea intermittently but no signs of new infection. She also has some chronic cough history after severe pneumonia in 2012 and 2013. CXR fairly benign, maybe basilar atelectasis vs scarring vs small effusion. Got aztreonam/levaquin/zyvox at presentation.  -Will discontinue empiric abtx for HCAP, will f/u ID recs  Severe  protein-calorie malnutrition NPO for now with ongoing nausea/vomiting. 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 -consult to nutrition -NPO o/n, likely advance in AM  Diet: NPO VTE ppx: SCDs DNI   Dispo: Disposition is deferred at this time, awaiting improvement of current medical problems. Anticipated discharge in approximately 3-5 day(s).   The patient does have a current PCP Campbell Riches, MD) and does need an [facility] hospital follow-up appointment after discharge.  The patient does not know have transportation limitations that hinder transportation to clinic appointments.  Signed: Collier Salina, MD 03/10/2015, 10:52 PM

## 2015-03-10 NOTE — ED Notes (Signed)
Jacquelynn Cree, RN accepts report at this time. Report given on PT's time spent in ED room

## 2015-03-10 NOTE — Progress Notes (Signed)
Arrived and found patient has two established PIV. MD is at the bedside and is going to place a central line. [employer]

## 2015-03-10 NOTE — ED Notes (Signed)
PT transported to dialysis at this time via stretcher by JME RN

## 2015-03-10 NOTE — Consult Note (Signed)
Pharmacy Antibiotic Note  Paula Becker is a 40 y.o. female admitted on 03/10/2015 with sepsis possibly d/t HCAP.  Pharmacy has been consulted for levofloxacin/aztreonam dosing.  Pt w/ history of AIDS (CD4 count of 40 on 02/07/15) w/ history of noncompliance. Lactic acid elevated to 2.03 and WBC slightly elevated to 11.4. SCr more than doubled since last admission 1 month ago. Per nephrology, patient to go to HD this evening.   Plan: Levofloxacin 750 mg IV x1, then 500 mg IV q48h Aztreonam 2 g IV x1, then 1 g IV q24h Linezolid 600 mg IV q12h (per MD dosing) F/u HD plans and renal function Monitor cultures, LOT, and clinical progression  Height: 5\' 11"  (180.3 cm) Weight: 139 lb 8.8 oz (63.3 kg) IBW/kg (Calculated) : 70.8  Temp (24hrs), Avg:98.3 F (36.8 C), Min:98.1 F (36.7 C), Max:98.5 F (36.9 C)   Recent Labs Lab 03/10/15 1432 03/10/15 1708  WBC 11.4*  --   CREATININE 16.37*  --   LATICACIDVEN  --  1.84    Estimated Creatinine Clearance: 4.6 mL/min (by C-G formula based on Cr of 16.37).    Allergies  Allergen Reactions  . Shellfish-Derived Products Swelling    Facial swelling  . Vancomycin Rash    Antimicrobials this admission: Levofloxacin 2/8 >>  Aztreonam  2/8 >>  Linezolid 2/8 >>  Dose adjustments this admission: n/a  Microbiology results: 2/8 BCx: sent  Thank you for allowing pharmacy to be a part of this patient's care.  Joya San, PharmD Clinical Pharmacy Resident Pager # [phone] 03/10/2015 6:42 PM   Pharmacy Code Sepsis Protocol  Time of code sepsis page: 1707 [x]  Antibiotics delivered at 1715   Were antibiotics ordered at the time of the code sepsis page? Yes Was it required to contact the physician? [x]  Physician not contacted []  Physician contacted to order antibiotics for code sepsis []  Physician contacted to recommend changing antibiotics  Pharmacy consulted for: levofloxacin and aztreonam  Anti-infectives    Start     Dose/Rate  Route Frequency Ordered Stop   03/12/15 1800  levofloxacin (LEVAQUIN) IVPB 500 mg     500 mg 100 mL/hr over 60 Minutes Intravenous Every 48 hours 03/10/15 1834     03/11/15 1800  aztreonam (AZACTAM) 1 g in dextrose 5 % 50 mL IVPB     1 g 100 mL/hr over 30 Minutes Intravenous Every 24 hours 03/10/15 1834     03/10/15 1800  linezolid (ZYVOX) IVPB 600 mg     600 mg 300 mL/hr over 60 Minutes Intravenous Every 12 hours 03/10/15 1729     03/10/15 1715  levofloxacin (LEVAQUIN) IVPB 750 mg     750 mg 100 mL/hr over 90 Minutes Intravenous  Once 03/10/15 1700     03/10/15 1715  aztreonam (AZACTAM) 2 g in dextrose 5 % 50 mL IVPB     2 g 100 mL/hr over 30 Minutes Intravenous  Once 03/10/15 1700 03/10/15 1817        Nurse education provided: [x]  Minutes left to administer antibiotics to achieve 1 hour goal [x]  Correct order of antibiotic administration [x]  Antibiotic Y-site compatibilities     Roma Schanz, PharmD 03/10/2015, 6:43 PM

## 2015-03-10 NOTE — ED Notes (Addendum)
22G IV placed in Right AC at this time. 2 attempts required. IV flushes and gives blood return. IV is positional and catheter is secured approximate 1.5 cm out of site. IV continues to flush well and give blood return. JME, RN to attempt [name] IV placement.

## 2015-03-10 NOTE — Telephone Encounter (Signed)
Father is worried about the pt because she can't walk and she is extremely weak.  Pt has a scheduled appt for 03/22/15 at [facility] with Dr. Johnnye Sima.  Father does not want to wait that long to have the pt evaluated.  No appts available at [facility].  RN advised the pt's father to take her to the ED for evaluation.

## 2015-03-10 NOTE — ED Provider Notes (Signed)
CSN: [id number]     Arrival date & time 03/10/15  1401 History   First MD Initiated Contact with Patient 03/10/15 1604     Chief Complaint  Patient presents with  . Abdominal Pain     (Consider location/radiation/quality/duration/timing/severity/associated sxs/prior Treatment) HPI Comments: Patient with history of AIDS, last CD4 count 40 presenting with 3 day history of abdominal pain, nausea, vomiting and diarrhea. States she's had diarrhea approximately 3 times a day that is nonbloody. Only one episode of vomiting. Denies fevers. Denies dysuria hematuria. Denies vaginal bleeding or discharge. States compliance with her heart therapy. Abdominal pain is diffuse and worse with palpation. States she cannot keep anything down. Also has intermittent episodes of left-sided chest pain lasting approximately several hours at a time,  They come and go. She comes in with generalized weakness unable to tolerate by mouth and unable to walk.   The history is provided by the patient. The history is limited by the condition of the patient.    Past Medical History  Diagnosis Date  . HIV (human immunodeficiency virus infection) ([place])   . MRSA (methicillin resistant staph aureus) culture positive   . Necrotizing pneumonia ([place]) 07/2010  . Pneumonia 06/23/11    RLL patchy, nodular lung disease  . Anemia of chronic disease   . History of noncompliance with medical treatment   . Herpes    Past Surgical History  Procedure Laterality Date  . Cesarean section  1998  . Breast surgery  03/2010    right; "don't know what they did"  . Video bronchoscopy  06/28/2011    Procedure: VIDEO BRONCHOSCOPY WITH FLUORO;  Surgeon: Chesley Mires, MD;  Location: [facility];  Service: Cardiopulmonary;  Laterality: Bilateral;   Family History  Problem Relation Age of Onset  . Lung disease Mother     passed away at 34 with lung disease  . Hypertension Father    Social History  Substance Use Topics  . Smoking status: Never  Smoker   . Smokeless tobacco: Never Used  . Alcohol Use: No   OB History    No data available     Review of Systems  Constitutional: Positive for activity change, appetite change and fatigue. Negative for fever.  Respiratory: Positive for chest tightness.   Cardiovascular: Positive for chest pain.  Gastrointestinal: Positive for nausea, vomiting, abdominal pain and diarrhea. Negative for blood in stool.  Genitourinary: Negative for vaginal bleeding and vaginal discharge.  Musculoskeletal: Positive for myalgias and arthralgias. Negative for back pain and neck pain.  Skin: Negative for rash.  Neurological: Positive for dizziness, weakness and light-headedness.  A complete 10 system review of systems was obtained and all systems are negative except as noted in the HPI and PMH.      Allergies  Shellfish-derived products and Vancomycin  Home Medications   Prior to Admission medications   Medication Sig Start Date End Date Taking? Authorizing Provider  alum & mag hydroxide-simeth (MAALOX/MYLANTA) 200-200-20 MG/5ML suspension Take 30 mLs by mouth every 6 (six) hours as needed for indigestion, heartburn or flatulence. Patient not taking: Reported on 03/10/2015 02/11/15   Burgess Estelle, MD  azithromycin (ZITHROMAX) 600 MG tablet Take 2 tablets (1200 mg) by mouth every Tuesday for 11 more weeks 02/16/15 04/03/15  Burgess Estelle, MD  fluconazole (DIFLUCAN) 100 MG tablet Take 100 mg by mouth daily. 02/11/15   Historical Provider, MD  gabapentin (NEURONTIN) 300 MG capsule TAKE 1 CAPSULE BY MOUTH THREE TIMES DAILY Patient not taking: Reported on  01/18/2015 12/28/14   Campbell Riches, MD  hydrOXYzine (ATARAX/VISTARIL) 25 MG tablet Take 1 tablet (25 mg total) by mouth every 6 (six) hours. Patient not taking: Reported on 01/18/2015 11/27/14   Ashley Murrain, NP  sulfamethoxazole-trimethoprim (BACTRIM,SEPTRA) 400-80 MG tablet Take 1 tablet by mouth 3 (three) times a week. For 3 weeks 02/11/15   Historical  Provider, MD  valACYclovir (VALTREX) 500 MG tablet Take 1 tablet (500 mg total) by mouth daily. 02/12/15   Burgess Estelle, MD   BP 125/83 mmHg  Pulse 84  Temp(Src) 98.1 F (36.7 C) (Oral)  Resp 24  Ht 5\' 11"  (1.803 m)  Wt 126 lb 5.2 oz (57.3 kg)  BMI 17.63 kg/m2  SpO2 100%  LMP 11/16/2014 Physical Exam  Constitutional: She is oriented to person, place, and time. She appears well-developed and well-nourished. No distress.  Cachectic and malnourished ill-appearing  HENT:  Head: Normocephalic and atraumatic.  Mouth/Throat: Oropharynx is clear and moist.  Dry mucus membranes, pale conjunctiva  Eyes: Conjunctivae and EOM are normal. Pupils are equal, round, and reactive to light.  Neck: Normal range of motion. Neck supple.  Cardiovascular: Normal rate and normal heart sounds.   No murmur heard. tachycardic  Pulmonary/Chest: Effort normal and breath sounds normal. No respiratory distress.  Abdominal: Soft. There is tenderness. There is no rebound and no guarding.  Diffuse lower abdominal tenderness  Genitourinary:  Rectal skin break down, chaperone present, no gross blood  Musculoskeletal: Normal range of motion. She exhibits no edema or tenderness.  Neurological: She is alert and oriented to person, place, and time. No cranial nerve deficit. She exhibits normal muscle tone. Coordination normal.  Skin: Skin is warm. No rash noted.  Nursing note and vitals reviewed.   ED Course  Procedures (including critical care time) Labs Review Labs Reviewed  LIPASE, BLOOD - Abnormal; Notable for the following:    Lipase 67 (*)    All other components within normal limits  COMPREHENSIVE METABOLIC PANEL - Abnormal; Notable for the following:    CO2 9 (*)    Glucose, Bld 145 (*)    BUN 99 (*)    Creatinine, Ser 16.37 (*)    Calcium 6.5 (*)    Albumin <1.0 (*)    AST 14 (*)    ALT 8 (*)    GFR calc non Af Amer 2 (*)    GFR calc Af Amer 3 (*)    Anion gap 17 (*)    All other components  within normal limits  CBC - Abnormal; Notable for the following:    WBC 11.4 (*)    RBC 2.42 (*)    Hemoglobin 5.6 (*)    HCT 17.6 (*)    MCV 72.7 (*)    MCH 23.1 (*)    RDW 18.7 (*)    All other components within normal limits  POC OCCULT BLOOD, ED - Abnormal; Notable for the following:    Fecal Occult Bld POSITIVE (*)    All other components within normal limits  I-STAT VENOUS BLOOD GAS, ED - Abnormal; Notable for the following:    pH, Ven 7.207 (*)    pCO2, Ven 24.4 (*)    pO2, Ven 23.0 (*)    Bicarbonate 9.7 (*)    Acid-base deficit 17.0 (*)    All other components within normal limits  I-STAT CG4 LACTIC ACID, ED - Abnormal; Notable for the following:    Lactic Acid, Venous 2.03 (*)    All other  components within normal limits  CULTURE, BLOOD (ROUTINE X 2)  CULTURE, BLOOD (ROUTINE X 2)  URINE CULTURE  MRSA PCR SCREENING  URINALYSIS, ROUTINE W REFLEX MICROSCOPIC (NOT AT [facility])  IRON AND TIBC  FERRITIN  PARATHYROID HORMONE, INTACT (NO CA)  VITAMIN D 25 HYDROXY (VIT D DEFICIENCY, FRACTURES)  RENAL FUNCTION PANEL  CBC  HEPATITIS B SURFACE ANTIGEN  HEPATITIS B SURFACE ANTIBODY  HEPATITIS B CORE ANTIBODY, TOTAL  PREALBUMIN  MAGNESIUM  PROTIME-INR  APTT  LACTIC ACID, PLASMA  I-STAT BETA HCG BLOOD, ED ([facility], [facility], [facility] ONLY)  I-STAT CG4 LACTIC ACID, ED  TYPE AND SCREEN  PREPARE RBC (CROSSMATCH)  PREPARE RBC (CROSSMATCH)    Imaging Review Ct Abdomen Pelvis Wo Contrast  03/10/2015  CLINICAL DATA:  Acute lower abdominal pain. EXAM: CT ABDOMEN AND PELVIS WITHOUT CONTRAST TECHNIQUE: Multidetector CT imaging of the abdomen and pelvis was performed following the standard protocol without IV contrast. COMPARISON:  None. FINDINGS: Bronchiectasis and scarring is noted in the right middle and left lower lobes. No significant osseous abnormality is noted. No gallstones are noted. Small right hepatic cyst is noted. The spleen and pancreas appear normal on these unenhanced images. Adrenal  glands and kidneys appear normal. No hydronephrosis or renal obstruction is noted. The appendix appears normal. There is no evidence of bowel obstruction. Uterus appears normal. Left ovary appears normal. Right ovary is not well visualized. Moderate amount of free fluid is noted in the pelvis. Urinary bladder appears normal. No significant adenopathy is noted. IMPRESSION: Bronchiectasis and scarring is noted in both lung bases. Moderate amount of free fluid is noted in the pelvis. Uterus and left ovary appear normal. Right ovary is not well visualized. Pelvic ultrasound is recommended to evaluate for possible right ovarian cyst or other pathology. Electronically Signed   By: Marijo Conception, M.D.   On: 03/10/2015 19:02   Dg Chest Port 1 View  03/10/2015  CLINICAL DATA:  Encounter for central line placement. EXAM: PORTABLE CHEST 1 VIEW COMPARISON:  Same day. FINDINGS: The heart size and mediastinal contours are within normal limits. No pneumothorax is noted. Interval placement of right internal jugular catheter with distal tip in expected position of the SVC. Mild bibasilar atelectasis is noted. Mild bilateral pleural effusions are noted. The visualized skeletal structures are unremarkable. IMPRESSION: Stable mild bibasilar atelectasis with associated pleural effusions. Interval placement of right internal jugular catheter line with distal tip in expected position of the SVC. No pneumothorax is noted. Electronically Signed   By: Marijo Conception, M.D.   On: 03/10/2015 19:49   Dg Chest Portable 1 View  03/10/2015  CLINICAL DATA:  Weakness and cough for 3-4 days. Difficulty walking. Nausea and vomiting. Diarrhea. Lower abdominal pain. HIV. EXAM: PORTABLE CHEST 1 VIEW COMPARISON:  02/06/2014 FINDINGS: Indistinct airspace opacities pressed along both hemidiaphragms along with some questionable opacity in the right upper lung at the level of the aortic arch. The lungs appear otherwise clear. Cardiac and mediastinal  margins appear normal. Reverse lordotic projection. The generalized interstitial opacity has improved compared to 02/07/15. IMPRESSION: 1. The generalized interstitial opacity shown on 02/07/2015 is improved, but there some persistent indistinct airspace opacities at both lung bases and in the right mid upper lung such that residual multilobar pneumonia is a concern. Electronically Signed   By: Van Clines M.D.   On: 03/10/2015 17:25   Dg Abd Portable 1v  03/10/2015  CLINICAL DATA:  Abdominal pain EXAM: PORTABLE ABDOMEN - 1 VIEW COMPARISON:  02/10/2015 FINDINGS: The bowel gas pattern is normal. No radio-opaque calculi or other significant radiographic abnormality are seen. IMPRESSION: Negative. Electronically Signed   By: Kathreen Devoid   On: 03/10/2015 17:24   I have personally reviewed and evaluated these images and lab results as part of my medical decision-making.   EKG Interpretation   Date/Time:  Wednesday March 10 2015 16:26:35 EST Ventricular Rate:  118 PR Interval:  126 QRS Duration: 75 QT Interval:  307 QTC Calculation: 430 R Axis:   53 Text Interpretation:  Sinus tachycardia Rate faster Confirmed by Wyvonnia Dusky   MD, [facility] [phone]) on 03/10/2015 4:35:57 PM      MDM   Final diagnoses:  Uremia  AIDS (acquired immune deficiency syndrome) (HCC)  Anemia, unspecified anemia type  HCAP (healthcare-associated pneumonia)   AIDs patient with nausea vomiting diarrhea and lower abdominal pain for the past 3 days. She is ill-appearing on arrival and tachycardic and malnourished.  Triage labs show hemoglobin of 5.6 decreased from baseline of 8. Creatinine is 16 which is double from previous values last month. She is not on dialysis. Bicarbonate is 9. Potassium is normal.  D/w Dr. Mercy Moore of nephrology.  He will evaluate.  Potassium normal.  Bicarb 9.  Agrees with IV saline bolus as well as bicarb gtt.   Patient d/w Dr. Augustin Coupe of nephrology. Plan for emergent dialysis tonight. Critical  care consulted for dialysis catheter placement. Dr. Ashok Cordia feels patient can be admitted to step down unit by primary team.  Patient will be treated with IV antibiotics for pneumonia seen on chest x-ray. No fever or leukocytosis, lactate minimally elevated. CT scan is reassuring other than some free fluid in the pelvis and ultrasound is recommended.  Dialysis catheter placed by [facility] NP.  To dialysis from the ED.  Airway stable. pRBCs ordered and to be given in dialysis per nephrology. Discussed admission with [facility] residents to stepdown unit.  CRITICAL CARE Performed by: Ezequiel Essex Total critical care time: 60 minutes Critical care time was exclusive of separately billable procedures and treating other patients. Critical care was necessary to treat or prevent imminent or life-threatening deterioration. Critical care was time spent personally by me on the following activities: development of treatment plan with patient and/or surrogate as well as nursing, discussions with consultants, evaluation of patient's response to treatment, examination of patient, obtaining history from patient or surrogate, ordering and performing treatments and interventions, ordering and review of laboratory studies, ordering and review of radiographic studies, pulse oximetry and re-evaluation of patient's condition.   Ezequiel Essex, MD 03/11/15 0330

## 2015-03-11 ENCOUNTER — Ambulatory Visit (HOSPITAL_COMMUNITY): Payer: Medicare Other

## 2015-03-11 DIAGNOSIS — E43 Unspecified severe protein-calorie malnutrition: Secondary | ICD-10-CM

## 2015-03-11 DIAGNOSIS — N051 Unspecified nephritic syndrome with focal and segmental glomerular lesions: Secondary | ICD-10-CM | POA: Diagnosis present

## 2015-03-11 DIAGNOSIS — B20 Human immunodeficiency virus [HIV] disease: Secondary | ICD-10-CM | POA: Diagnosis present

## 2015-03-11 DIAGNOSIS — N183 Chronic kidney disease, stage 3 (moderate): Secondary | ICD-10-CM

## 2015-03-11 DIAGNOSIS — R05 Cough: Secondary | ICD-10-CM

## 2015-03-11 DIAGNOSIS — N19 Unspecified kidney failure: Secondary | ICD-10-CM | POA: Diagnosis present

## 2015-03-11 DIAGNOSIS — R197 Diarrhea, unspecified: Secondary | ICD-10-CM

## 2015-03-11 DIAGNOSIS — B009 Herpesviral infection, unspecified: Secondary | ICD-10-CM

## 2015-03-11 DIAGNOSIS — D649 Anemia, unspecified: Secondary | ICD-10-CM

## 2015-03-11 DIAGNOSIS — N269 Renal sclerosis, unspecified: Secondary | ICD-10-CM

## 2015-03-11 DIAGNOSIS — R109 Unspecified abdominal pain: Secondary | ICD-10-CM

## 2015-03-11 LAB — RENAL FUNCTION PANEL
Albumin: <1 g/dL — ABNORMAL LOW (ref 3.5–5.0)
Anion gap: 12 (ref 5–15)
BUN: 34 mg/dL — AB (ref 6–20)
CALCIUM: 6.5 mg/dL — AB (ref 8.9–10.3)
CHLORIDE: 101 mmol/L (ref 101–111)
CO2: 23 mmol/L (ref 22–32)
CREATININE: 6.51 mg/dL — AB (ref 0.44–1.00)
GFR, EST AFRICAN AMERICAN: 8 mL/min — AB (ref >60)
GFR, EST NON AFRICAN AMERICAN: 7 mL/min — AB (ref >60)
Glucose, Bld: 85 mg/dL (ref 65–99)
Phosphorus: 5.6 mg/dL — ABNORMAL HIGH (ref 2.5–4.6)
Potassium: 3.2 mmol/L — ABNORMAL LOW (ref 3.5–5.1)
SODIUM: 136 mmol/L (ref 135–145)

## 2015-03-11 LAB — CBC
HCT: 26 % — ABNORMAL LOW (ref 36.0–46.0)
Hemoglobin: 9.3 g/dL — ABNORMAL LOW (ref 12.0–15.0)
MCH: 27.7 pg (ref 26.0–34.0)
MCHC: 35.8 g/dL (ref 30.0–36.0)
MCV: 77.4 fL — AB (ref 78.0–100.0)
PLATELETS: 227 10*3/uL (ref 150–400)
RBC: 3.36 MIL/uL — AB (ref 3.87–5.11)
RDW: 18.8 % — AB (ref 11.5–15.5)
WBC: 5.4 10*3/uL (ref 4.0–10.5)

## 2015-03-11 LAB — TYPE AND SCREEN
ABO/RH(D): B POS
Antibody Screen: NEGATIVE
UNIT DIVISION: 0
UNIT DIVISION: 0

## 2015-03-11 LAB — IRON AND TIBC: Iron: 38 ug/dL (ref 28–170)

## 2015-03-11 LAB — MAGNESIUM: MAGNESIUM: 1.5 mg/dL — AB (ref 1.7–2.4)

## 2015-03-11 LAB — LACTIC ACID, PLASMA: LACTIC ACID, VENOUS: 0.8 mmol/L (ref 0.5–2.0)

## 2015-03-11 LAB — PROTIME-INR
INR: 1.37 (ref 0.00–1.49)
Prothrombin Time: 17 seconds — ABNORMAL HIGH (ref 11.6–15.2)

## 2015-03-11 LAB — APTT: APTT: 37 s (ref 24–37)

## 2015-03-11 LAB — PREALBUMIN: Prealbumin: 2.8 mg/dL — ABNORMAL LOW (ref 18–38)

## 2015-03-11 LAB — FERRITIN: FERRITIN: 724 ng/mL — AB (ref 11–307)

## 2015-03-11 MED ORDER — HEPARIN SODIUM (PORCINE) 1000 UNIT/ML DIALYSIS: 1000.0000 [IU] | INTRAMUSCULAR | Status: DC | PRN

## 2015-03-11 MED ORDER — RENA-VITE PO TABS
1.0000 | ORAL_TABLET | Freq: Every day | ORAL | Status: DC
  Administered 2015-03-15 – 2015-03-16 (×2): 1 via ORAL
  Filled 2015-03-11 (×4): qty 1

## 2015-03-11 MED ORDER — DOXERCALCIFEROL 4 MCG/2ML IV SOLN
INTRAVENOUS | Status: AC
  Filled 2015-03-11: qty 2

## 2015-03-11 MED ORDER — SODIUM CHLORIDE 0.9 % IV SOLN: 100.0000 mL | INTRAVENOUS | Status: DC | PRN

## 2015-03-11 MED ORDER — DARBEPOETIN ALFA 100 MCG/0.5ML IJ SOSY
PREFILLED_SYRINGE | INTRAMUSCULAR | Status: AC
  Filled 2015-03-11: qty 0.5

## 2015-03-11 MED ORDER — MAGNESIUM SULFATE 2 GM/50ML IV SOLN
2.0000 g | Freq: Once | INTRAVENOUS | Status: DC
  Administered 2015-03-11: 2 g via INTRAVENOUS
  Filled 2015-03-11: qty 50

## 2015-03-11 MED ORDER — MAGNESIUM SULFATE 2 GM/50ML IV SOLN: 2.0000 g | Freq: Once | INTRAVENOUS | Status: DC

## 2015-03-11 MED ORDER — DOXERCALCIFEROL 4 MCG/2ML IV SOLN
2.0000 ug | INTRAVENOUS | Status: DC
  Administered 2015-03-11 – 2015-03-18 (×3): 2 ug via INTRAVENOUS
  Filled 2015-03-11 (×4): qty 2

## 2015-03-11 MED ORDER — DARBEPOETIN ALFA 100 MCG/0.5ML IJ SOSY
100.0000 ug | PREFILLED_SYRINGE | INTRAMUSCULAR | Status: DC
  Administered 2015-03-11 – 2015-03-18 (×2): 100 ug via INTRAVENOUS
  Filled 2015-03-11 (×2): qty 0.5

## 2015-03-11 NOTE — Progress Notes (Signed)
Subjective:  Dialysis today  She says since being discharged, she has not had appetite and just felt unwell. She was not able to make it to any of her clinic appointments. SHe has not taken any of her antibiotics and HAART since being discharged.  We initiated some of the discussion about her goals of care. Pt says that she would like dialysis to make her feel beter. SHe is also open to the idea of taking lifelong antiretroviral. She understands that the HIV associated kidney damage may not be reversible but taking the HAART may prevent further progression.  She denies having fevers, and denies hematochezia or melena. She had diarrhea for past few days.  She says she has no trouble swallowing, but does not like to take whole pills. THis has been an ongoing issue and I had spent a lot of time on this issue during her prior hospitalization. I am not sure what to offer her if she is unwilling to swallow tablets or take it in crushed form. Some of the tablets can not be crushed.   She is only DNI.    Objective: Vital signs in last 24 hours: Filed Vitals:   03/11/15 0033 03/11/15 0500 03/11/15 0834 03/11/15 1044  BP:  110/84 112/83 116/83  Pulse:  84 78 87  Temp:  97.9 F (36.6 C) 97.5 F (36.4 C) 97.6 F (36.4 C)  TempSrc:  Oral Oral Oral  Resp:  24 21 18   Height: 5\' 11"  (1.803 m)     Weight: 126 lb 5.2 oz (57.3 kg)     SpO2:  100% 100% 100%   Weight change:   Intake/Output Summary (Last 24 hours) at 03/11/15 1357 Last data filed at 03/11/15 1000  Gross per 24 hour  Intake    790 ml  Output      0 ml  Net    790 ml   General: Vital signs reviewed. cachectic pt lying in bed, has temp IJ catheter HEENT: MMM, no leukoplakia Cardiovascular: regular rate, rhythm, no pericardial friction rub Pulmonary/Chest: Clear to auscultation bilaterally, no wheezes or crackles Abdominal: Soft, non-tender, non-distended, BS + Extremities: some edema of feet and trace edema to mid shin ,  pulses symmetric and intact bilaterally.    Lab Results:  Results for orders placed or performed during the hospital encounter of 03/10/15 (from the past 24 hour(s))  I-Stat venous blood gas, ED     Status: Abnormal   Collection Time: 03/10/15  5:03 PM  Result Value Ref Range   pH, Ven 7.207 (L) 7.250 - 7.300   pCO2, Ven 24.4 (L) 45.0 - 50.0 mmHg   pO2, Ven 23.0 (LL) 30.0 - 45.0 mmHg   Bicarbonate 9.7 (L) 20.0 - 24.0 mEq/L   TCO2 10 0 - 100 mmol/L   O2 Saturation 31.0 %   Acid-base deficit 17.0 (H) 0.0 - 2.0 mmol/L   Patient temperature 98.5 F    Collection site RADIAL, ALLEN'S TEST ACCEPTABLE    Drawn by RT    Sample type MIXED VENOUS SAMPLE    Comment NOTIFIED PHYSICIAN   I-Stat CG4 Lactic Acid, ED     Status: None   Collection Time: 03/10/15  5:08 PM  Result Value Ref Range   Lactic Acid, Venous 1.84 0.5 - 2.0 mmol/L  POC occult blood, ED Provider will collect     Status: Abnormal   Collection Time: 03/10/15  5:11 PM  Result Value Ref Range   Fecal Occult Bld POSITIVE (A)  NEGATIVE  Type and screen [place]     Status: None   Collection Time: 03/10/15  6:00 PM  Result Value Ref Range   ABO/RH(D) B POS    Antibody Screen NEG    Sample Expiration 03/13/2015    Unit Number [id number]    Blood Component Type RED CELLS,LR    Unit division 00    Status of Unit ISSUED,FINAL    Transfusion Status OK TO TRANSFUSE    Crossmatch Result Compatible    Unit Number [id number]    Blood Component Type RED CELLS,LR    Unit division 00    Status of Unit ISSUED,FINAL    Transfusion Status OK TO TRANSFUSE    Crossmatch Result Compatible   Prepare RBC     Status: None   Collection Time: 03/10/15  6:00 PM  Result Value Ref Range   Order Confirmation ORDER PROCESSED BY BLOOD BANK   I-Stat CG4 Lactic Acid, ED     Status: Abnormal   Collection Time: 03/10/15  6:13 PM  Result Value Ref Range   Lactic Acid, Venous 2.03 (HH) 0.5 - 2.0 mmol/L   Comment NOTIFIED  PHYSICIAN   Prepare RBC     Status: None   Collection Time: 03/10/15  9:00 PM  Result Value Ref Range   Order Confirmation ORDER PROCESSED BY BLOOD BANK   Lactic acid, plasma     Status: None   Collection Time: 03/11/15  5:30 AM  Result Value Ref Range   Lactic Acid, Venous 0.8 0.5 - 2.0 mmol/L  Iron and TIBC     Status: None   Collection Time: 03/11/15  5:40 AM  Result Value Ref Range   Iron 38 28 - 170 ug/dL   TIBC NOT CALCULATED 250 - 450 ug/dL   Saturation Ratios NOT CALCULATED 10.4 - 31.8 %   UIBC NOT CALCULATED ug/dL  Ferritin     Status: Abnormal   Collection Time: 03/11/15  5:40 AM  Result Value Ref Range   Ferritin 724 (H) 11 - 307 ng/mL  Renal function panel     Status: Abnormal   Collection Time: 03/11/15  5:40 AM  Result Value Ref Range   Sodium 136 135 - 145 mmol/L   Potassium 3.2 (L) 3.5 - 5.1 mmol/L   Chloride 101 101 - 111 mmol/L   CO2 23 22 - 32 mmol/L   Glucose, Bld 85 65 - 99 mg/dL   BUN 34 (H) 6 - 20 mg/dL   Creatinine, Ser 6.51 (H) 0.44 - 1.00 mg/dL   Calcium 6.5 (L) 8.9 - 10.3 mg/dL   Phosphorus 5.6 (H) 2.5 - 4.6 mg/dL   Albumin <1.0 (L) 3.5 - 5.0 g/dL   GFR calc non Af Amer 7 (L) >60 mL/min   GFR calc Af Amer 8 (L) >60 mL/min   Anion gap 12 5 - 15  CBC     Status: Abnormal   Collection Time: 03/11/15  5:40 AM  Result Value Ref Range   WBC 5.4 4.0 - 10.5 K/uL   RBC 3.36 (L) 3.87 - 5.11 MIL/uL   Hemoglobin 9.3 (L) 12.0 - 15.0 g/dL   HCT 26.0 (L) 36.0 - 46.0 %   MCV 77.4 (L) 78.0 - 100.0 fL   MCH 27.7 26.0 - 34.0 pg   MCHC 35.8 30.0 - 36.0 g/dL   RDW 18.8 (H) 11.5 - 15.5 %   Platelets 227 150 - 400 K/uL  Prealbumin     Status: Abnormal  Collection Time: 03/11/15  5:40 AM  Result Value Ref Range   Prealbumin 2.8 (L) 18 - 38 mg/dL  Magnesium     Status: Abnormal   Collection Time: 03/11/15  5:40 AM  Result Value Ref Range   Magnesium 1.5 (L) 1.7 - 2.4 mg/dL  Protime-INR     Status: Abnormal   Collection Time: 03/11/15  5:40 AM  Result Value  Ref Range   Prothrombin Time 17.0 (H) 11.6 - 15.2 seconds   INR 1.37 0.00 - 1.49  APTT     Status: None   Collection Time: 03/11/15  5:40 AM  Result Value Ref Range   aPTT 37 24 - 37 seconds     Micro Results: No results found for this or any previous visit (from the past 240 hour(s)). Studies/Results: Ct Abdomen Pelvis Wo Contrast  03/10/2015  CLINICAL DATA:  Acute lower abdominal pain. EXAM: CT ABDOMEN AND PELVIS WITHOUT CONTRAST TECHNIQUE: Multidetector CT imaging of the abdomen and pelvis was performed following the standard protocol without IV contrast. COMPARISON:  None. FINDINGS: Bronchiectasis and scarring is noted in the right middle and left lower lobes. No significant osseous abnormality is noted. No gallstones are noted. Small right hepatic cyst is noted. The spleen and pancreas appear normal on these unenhanced images. Adrenal glands and kidneys appear normal. No hydronephrosis or renal obstruction is noted. The appendix appears normal. There is no evidence of bowel obstruction. Uterus appears normal. Left ovary appears normal. Right ovary is not well visualized. Moderate amount of free fluid is noted in the pelvis. Urinary bladder appears normal. No significant adenopathy is noted. IMPRESSION: Bronchiectasis and scarring is noted in both lung bases. Moderate amount of free fluid is noted in the pelvis. Uterus and left ovary appear normal. Right ovary is not well visualized. Pelvic ultrasound is recommended to evaluate for possible right ovarian cyst or other pathology. Electronically Signed   By: Marijo Conception, M.D.   On: 03/10/2015 19:02   Dg Chest Port 1 View  03/10/2015  CLINICAL DATA:  Encounter for central line placement. EXAM: PORTABLE CHEST 1 VIEW COMPARISON:  Same day. FINDINGS: The heart size and mediastinal contours are within normal limits. No pneumothorax is noted. Interval placement of right internal jugular catheter with distal tip in expected position of the SVC. Mild  bibasilar atelectasis is noted. Mild bilateral pleural effusions are noted. The visualized skeletal structures are unremarkable. IMPRESSION: Stable mild bibasilar atelectasis with associated pleural effusions. Interval placement of right internal jugular catheter line with distal tip in expected position of the SVC. No pneumothorax is noted. Electronically Signed   By: Marijo Conception, M.D.   On: 03/10/2015 19:49   Dg Chest Portable 1 View  03/10/2015  CLINICAL DATA:  Weakness and cough for 3-4 days. Difficulty walking. Nausea and vomiting. Diarrhea. Lower abdominal pain. HIV. EXAM: PORTABLE CHEST 1 VIEW COMPARISON:  02/06/2014 FINDINGS: Indistinct airspace opacities pressed along both hemidiaphragms along with some questionable opacity in the right upper lung at the level of the aortic arch. The lungs appear otherwise clear. Cardiac and mediastinal margins appear normal. Reverse lordotic projection. The generalized interstitial opacity has improved compared to 02/07/15. IMPRESSION: 1. The generalized interstitial opacity shown on 02/07/2015 is improved, but there some persistent indistinct airspace opacities at both lung bases and in the right mid upper lung such that residual multilobar pneumonia is a concern. Electronically Signed   By: Van Clines M.D.   On: 03/10/2015 17:25   Dg Abd  Portable 1v  03/10/2015  CLINICAL DATA:  Abdominal pain EXAM: PORTABLE ABDOMEN - 1 VIEW COMPARISON:  02/10/2015 FINDINGS: The bowel gas pattern is normal. No radio-opaque calculi or other significant radiographic abnormality are seen. IMPRESSION: Negative. Electronically Signed   By: Kathreen Devoid   On: 03/10/2015 17:24   Medications: I have reviewed the patient's current medications. Scheduled Meds: . sodium chloride   Intravenous Once  . sodium chloride   Intravenous Once  . azithromycin  1,200 mg Oral Q Thu-1800  . darbepoetin (ARANESP) injection - DIALYSIS  100 mcg Intravenous Q Thu-HD  . doxercalciferol  2 mcg  Intravenous Q T,Th,Sa-HD  . feeding supplement (ENSURE ENLIVE)  237 mL Oral TID BM  . fluconazole  100 mg Oral Daily  . multivitamin  1 tablet Oral QHS  . sodium chloride flush  3 mL Intravenous Q12H  . sulfamethoxazole-trimethoprim  1 tablet Oral Once per day on Mon Wed Fri   Continuous Infusions:  PRN Meds:.sodium chloride, sodium chloride, acetaminophen **OR** acetaminophen, alteplase, heparin, lidocaine (PF), lidocaine-prilocaine, ondansetron **OR** ondansetron (ZOFRAN) IV, pentafluoroprop-tetrafluoroeth Assessment/Plan: Principal Problem:   Acute on chronic renal failure (HCC) Active Problems:   Anemia of chronic disease   Severe protein-calorie malnutrition (HCC)   Chronic cough   AIDS ([place])   Vitamin D deficiency   Secondary hyperparathyroidism of renal origin ([place])  Diarrhea, n,v and some change in MentalStatus: Likely secondary to progression of her kidney disease and uremia. Could also be c dif, as her immune function is not up to par and she has taken a lot of antibiotics in the last few months and other risk factors present. In prior admission, GI panel was all negative. cdiff and cryptosporidium studies were negative. N/v  Could be due to symptomatic anaemia too. 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 Nephrology was consulted during prior admission and is following her. Her baseline creatinine used to be around 1.5 and she had presented with AoCKDV with nephrotic range proteuria and when she was discharged, creatinine was around 7. Nephrology thought it was not useful for a biopsy due to its risks, as the presentation was highly likely to be HIVAN and the only treatment for that is re-initiation of HAART. Since being discharged, she has not taken HAART. She came in on 2/8 with uremia, n/v, and mental status changes with   SCr of 16 and BUN of 99. A temporary HD cath placed and she had emergent dialysis and pt is being dialyzed again today   -nephrology following and appreciate the recs -dialysis today - I have consulted [facility] for perm cath placement and vein mapping  -aranesp and renal supplements   AIDS: Last CD4 count was 40 in Jan 2017 and pt has AIDS defining illness. ID was following during prior admission and I have re-consulted them. Pt has not taken HAART therapy and not followed up. I discussed with Dr Baxter Flattery in person, and she recommended obtaining G6PD to determine if pt can be placed on dapsone for PCP ppx , and will re-obtain HIV viral load with genotype. For now, we will continue the renally dosed prophylaxis for Opportu.Infx.   -appreciate ID recs  -weekly azithromycin every Thursday -Bactrim three times a week, renally adjusted -diflucan daily     (above three for Oi ppx) -G6PD lab -HIV viral load with genotype  -Holding HAART  therapy for now per ID   Acute on chronic anaemia : HgB of 5.4 on admission. FOBT +. But pt denies any blood loss or melena. Probably this is secondary to worsening renal function as ferritin is 724. Her HgB has been in the 7-8 range in last admission. She was transfused 2 units PRBC.  Post transfusion CBC was 9.3  -2 units transfused  -trend CBC daily -transfuse if <7 -iron supplementation per nephrology -HyperPTH work up per nephrology    Hypokalemia: Please do not replace Potassium (per nephrology). Please check with nephrology prior to replacing any electrolytes   Severe malnutrition: Had loss of appetite during prior month after being discharge. Etiology most likely due to buildup of Uremia due to progressively worsening renal function. HIV also can lead to wasting.   -consulted nutrition -in the prior admission, I had started Megace and pt had felt better after being started on that- if her appetite does not improve, can re-start megace. -PT OT   Prior  admission for CAP: In prior admission, urine positive for strep pneumo antigen. She had received ceftriaxone course. Her dry cough persisted.  -defer to ID, if the CAP/HCAP needs to be rx or not- my guess is not as we re-started Oi ppx.   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 We did not get chance to talk to pt about this issue  - consider re-starting valtrex if she has symptoms.   Low vitamin D- PTH >400 prior -this would not be her main concern right now but VitD 50,000 IU is the recommended rx.    Dispo: Disposition is deferred at this time, awaiting improvement of current medical problems.  Anticipated discharge in approximately 7 day(s).   The patient does have a current PCP Campbell Riches, MD) and does need an [facility] hospital follow-up appointment after discharge.  The patient does not have transportation limitations that hinder transportation to clinic appointments.  .Services Needed at time of discharge: Y = Yes, Blank = No PT:   OT:   RN:   Equipment:   Other:     LOS: 1 day   Burgess Estelle, MD 03/11/2015, 1:57 PM

## 2015-03-11 NOTE — Progress Notes (Signed)
Internal Medicine Attending  Date: 03/11/2015  Patient name: Paula Becker Medical record number: [id number] Date of birth: 08-22-1975 Age: 40 y.o. Gender: female  I saw and evaluated the patient. I reviewed the resident's note by Dr. Tiburcio Pea and I agree with the resident's findings and plans as documented in his progress note.  Please see my H&P dated 03/11/2015 and attached to Dr. Marveen Reeks H&P dated 03/10/2015 for the specifics of my evaluation, assessment, and plan from earlier today.

## 2015-03-11 NOTE — Consult Note (Signed)
Hospital Consult    Reason for Consult:  In need of permanent HD access and tunneled catheter Referring Physician:  Mercy Moore MRN #:  [id number]  History of Present Illness: This is a 40 y.o. female who presented to the ER yesterday after a few days of not feeling well and having abdominal pain/cramping.  She states she was also nauseated, but only vomited once and some diarrhea.  She states that she has never been on dialysis until this admission.   She states that she has not been making much urine.   She did have a temporary right IJ dialysis catheter placed by CCM yesterday.    She states that she had chest pain for a couple of days.  Nothing made this better or worse.  It is improved at this time.  She has a hx of HIV/AIDS with reported last CD4 count 40-60.  She has been on HAART, but did miss dosing this past week but has also been non compliant in the past.   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    She is right hand dominant.    Past Medical History  Diagnosis Date  . HIV (human immunodeficiency virus infection) ([place])   . MRSA (methicillin resistant staph aureus) culture positive   . Necrotizing pneumonia ([place]) 07/2010  . Pneumonia 06/23/11    RLL patchy, nodular lung disease  . Anemia of chronic disease   . History of noncompliance with medical treatment   . Herpes     Past Surgical History  Procedure Laterality Date  . Cesarean section  1998  . Breast surgery  03/2010    right; "don't know what they did"  . Video bronchoscopy  06/28/2011    Procedure: VIDEO BRONCHOSCOPY WITH FLUORO;  Surgeon: Chesley Mires, MD;  Location: [facility];  Service: Cardiopulmonary;  Laterality: Bilateral;    Allergies  Allergen Reactions  . Shellfish-Derived Products Swelling    Facial swelling  . Vancomycin Rash    Prior to Admission medications   Medication Sig Start Date End Date Taking? Authorizing Provider  alum & mag hydroxide-simeth (MAALOX/MYLANTA) 200-200-20  MG/5ML suspension Take 30 mLs by mouth every 6 (six) hours as needed for indigestion, heartburn or flatulence. Patient not taking: Reported on 03/10/2015 02/11/15   Burgess Estelle, MD  azithromycin (ZITHROMAX) 600 MG tablet Take 2 tablets (1200 mg) by mouth every Tuesday for 11 more weeks 02/16/15 04/03/15  Burgess Estelle, MD  fluconazole (DIFLUCAN) 100 MG tablet Take 100 mg by mouth daily. 02/11/15   Historical Provider, MD  gabapentin (NEURONTIN) 300 MG capsule TAKE 1 CAPSULE BY MOUTH THREE TIMES DAILY Patient not taking: Reported on 01/18/2015 12/28/14   Campbell Riches, MD  hydrOXYzine (ATARAX/VISTARIL) 25 MG tablet Take 1 tablet (25 mg total) by mouth every 6 (six) hours. Patient not taking: Reported on 01/18/2015 11/27/14   Ashley Murrain, NP  sulfamethoxazole-trimethoprim (BACTRIM,SEPTRA) 400-80 MG tablet Take 1 tablet by mouth 3 (three) times a week. For 3 weeks 02/11/15   Historical Provider, MD  valACYclovir (VALTREX) 500 MG tablet Take 1 tablet (500 mg total) by mouth daily. 02/12/15   Burgess Estelle, MD    Social History   Social History  . Marital Status: Single    Spouse Name: N/A  . Number of Children: N/A  . Years of Education: N/A   Occupational History  . Not on file.   Social History Main Topics  . Smoking status: Never Smoker   .  Smokeless tobacco: Never Used  . Alcohol Use: No  . Drug Use: No  . Sexual Activity: Not on file     Comment: declined condoms   Other Topics Concern  . Not on file   Social History Narrative   Lives in [place] in her house.   Has support from her Dad for doctor visits.   Didn't work ever. Not working now.     Family History  Problem Relation Age of Onset  . Lung disease Mother     passed away at 71 with lung disease  . Hypertension Father     ROS: [x]  Positive   [ ]  Negative   [ ]  All sytems reviewed and are negative  Cardiovascular: [x]  chest pain/pressure []  palpitations []  SOB lying flat []  DOE [x]  pain in legs while walking []   pain in legs at rest []  pain in legs at night []  non-healing ulcers []  hx of DVT []  swelling in legs  Pulmonary: []  productive cough []  asthma/wheezing []  home O2 [x]  chronic cough  Neurologic: []  weakness in []  arms []  legs []  numbness in []  arms []  legs []  hx of CVA []  mini stroke [] difficulty speaking or slurred speech []  temporary loss of vision in one eye []  dizziness  Hematologic: []  hx of cancer []  bleeding problems []  problems with blood clotting easily [x]  HIV /AIDS [x]  anemia  Endocrine:   []  diabetes []  thyroid disease  GI []  vomiting blood []  blood in stool [x]  N/V  GU: [x]  CKD/renal failure [x]  HD--[]  M/W/F or []  T/T/S []  burning with urination []  blood in urine  Psychiatric: []  anxiety []  depression  Musculoskeletal: []  arthritis []  joint pain  Integumentary: []  rashes []  ulcers  Constitutional: []  fever []  chills [x]  weakness   Physical Examination  Filed Vitals:   03/11/15 1430 03/11/15 1500  BP: 118/80 113/79  Pulse: 70 81  Temp:    Resp: 19 20   Body mass index is 17.41 kg/(m^2).  General:  WDWN in NAD Gait: Not observed HENT: WNL, normocephalic Pulmonary: normal non-labored breathing, without Rales, rhonchi,  wheezing Cardiac: regular Abdomen:  soft, NT/ND, no masses Skin: without rashes Vascular Exam/Pulses:  Right Left  Radial trace 2+ (normal)  Ulnar Unable to palpate  Unable to palpate   Femoral 2+ (normal) 2+ (normal)  Popliteal trace trace  DP 2+ (normal) 2+ (normal)  PT Unable to palpate  2+ (normal)   Extremities: without ischemic changes, without Gangrene , without cellulitis; without open wounds;  Musculoskeletal: no muscle wasting or atrophy  Neurologic: A&O X 3; Appropriate Affect ; SENSATION: normal; MOTOR FUNCTION:  moving all extremities equally. Speech is fluent/normal Psychiatric: Judgment intact, flat mood and affect Lymph : No Cervical, Axillary, or Inguinal lymphadenopathy    CBC      Component Value Date/Time   WBC 5.4 03/11/2015 0540   RBC 3.36* 03/11/2015 0540   RBC 3.01* 02/07/2015 0900   HGB 9.3* 03/11/2015 0540   HCT 26.0* 03/11/2015 0540   PLT 227 03/11/2015 0540   MCV 77.4* 03/11/2015 0540   MCH 27.7 03/11/2015 0540   MCHC 35.8 03/11/2015 0540   RDW 18.8* 03/11/2015 0540   LYMPHSABS 1.0 02/07/2015 0443   MONOABS 1.2* 02/07/2015 0443   EOSABS 0.0 02/07/2015 0443   BASOSABS 0.0 02/07/2015 0443    BMET    Component Value Date/Time   NA 136 03/11/2015 0540   K 3.2* 03/11/2015 0540   CL 101 03/11/2015 0540   CO2 23  03/11/2015 0540   GLUCOSE 85 03/11/2015 0540   GLUCOSE 118 04/01/2010   BUN 34* 03/11/2015 0540   CREATININE 6.51* 03/11/2015 0540   CREATININE 1.66* 09/30/2014 1416   CALCIUM 6.5* 03/11/2015 0540   GFRNONAA 7* 03/11/2015 0540   GFRNONAA 46* 10/08/2012 1058   GFRAA 8* 03/11/2015 0540   GFRAA 53* 10/08/2012 1058    COAGS: Lab Results  Component Value Date   INR 1.37 03/11/2015   Radiology: Ct Abdomen Pelvis Wo Contrast  03/10/2015  CLINICAL DATA:  Acute lower abdominal pain. EXAM: CT ABDOMEN AND PELVIS WITHOUT CONTRAST TECHNIQUE: Multidetector CT imaging of the abdomen and pelvis was performed following the standard protocol without IV contrast. COMPARISON:  None. FINDINGS: Bronchiectasis and scarring is noted in the right middle and left lower lobes. No significant osseous abnormality is noted. No gallstones are noted. Small right hepatic cyst is noted. The spleen and pancreas appear normal on these unenhanced images. Adrenal glands and kidneys appear normal. No hydronephrosis or renal obstruction is noted. The appendix appears normal. There is no evidence of bowel obstruction. Uterus appears normal. Left ovary appears normal. Right ovary is not well visualized. Moderate amount of free fluid is noted in the pelvis. Urinary bladder appears normal. No significant adenopathy is noted. IMPRESSION: Bronchiectasis and scarring is noted in both  lung bases. Moderate amount of free fluid is noted in the pelvis. Uterus and left ovary appear normal. Right ovary is not well visualized. Pelvic ultrasound is recommended to evaluate for possible right ovarian cyst or other pathology. Electronically Signed   By: Marijo Conception, M.D.   On: 03/10/2015 19:02   Dg Chest Port 1 View  03/10/2015  CLINICAL DATA:  Encounter for central line placement. EXAM: PORTABLE CHEST 1 VIEW COMPARISON:  Same day. FINDINGS: The heart size and mediastinal contours are within normal limits. No pneumothorax is noted. Interval placement of right internal jugular catheter with distal tip in expected position of the SVC. Mild bibasilar atelectasis is noted. Mild bilateral pleural effusions are noted. The visualized skeletal structures are unremarkable. IMPRESSION: Stable mild bibasilar atelectasis with associated pleural effusions. Interval placement of right internal jugular catheter line with distal tip in expected position of the SVC. No pneumothorax is noted. Electronically Signed   By: Marijo Conception, M.D.   On: 03/10/2015 19:49   Dg Chest Portable 1 View  03/10/2015  CLINICAL DATA:  Weakness and cough for 3-4 days. Difficulty walking. Nausea and vomiting. Diarrhea. Lower abdominal pain. HIV. EXAM: PORTABLE CHEST 1 VIEW COMPARISON:  02/06/2014 FINDINGS: Indistinct airspace opacities pressed along both hemidiaphragms along with some questionable opacity in the right upper lung at the level of the aortic arch. The lungs appear otherwise clear. Cardiac and mediastinal margins appear normal. Reverse lordotic projection. The generalized interstitial opacity has improved compared to 02/07/15. IMPRESSION: 1. The generalized interstitial opacity shown on 02/07/2015 is improved, but there some persistent indistinct airspace opacities at both lung bases and in the right mid upper lung such that residual multilobar pneumonia is a concern. Electronically Signed   By: Van Clines M.D.    On: 03/10/2015 17:25   Dg Abd Portable 1v  03/10/2015  CLINICAL DATA:  Abdominal pain EXAM: PORTABLE ABDOMEN - 1 VIEW COMPARISON:  02/10/2015 FINDINGS: The bowel gas pattern is normal. No radio-opaque calculi or other significant radiographic abnormality are seen. IMPRESSION: Negative. Electronically Signed   By: Kathreen Devoid   On: 03/10/2015 17:24      Non-Invasive Vascular Imaging:  Vein mapping ordered  Statin:  No. Beta Blocker:  No. Aspirin:  No. ACEI:  No. ARB:  No. Other antiplatelets/anticoagulants:  No.    ASSESSMENT/PLAN: This is a 40 y.o. 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   -will await vein mapping to determine dialysis access options.  She is right hand dominant, so hopefully, there will be adequate vein in the left arm.  She does have an IV in the hand and if we are able to use the left arm, the IV would need to be moved to the right hand.   -restrict the left arm for now -at the time of her access, we will also place tunneled catheter.    Leontine Locket, PA-C [facility] [phone]  Addendum  I have independently interviewed and examined the patient, and I agree with the physician assistant's findings.  I could not feel strong pulses in either arm, so I would additionally obtain bilateral upper extremity arterial dopplers.  The earliest her RIJV TDC exchange and hopefully left arm access could be done would be Tuesday.  Adele Barthel, MD [facility] Office: [phone] Pager: [phone]  03/11/2015, 7:22 PM

## 2015-03-11 NOTE — Progress Notes (Signed)
Utilization review completed. Loneta Tamplin, RN, BSN. 

## 2015-03-11 NOTE — Progress Notes (Signed)
S: Feels better.  Says she is hungry O:BP 110/84 mmHg  Pulse 84  Temp(Src) 97.9 F (36.6 C) (Oral)  Resp 24  Ht 5\' 11"  (1.803 m)  Wt 57.3 kg (126 lb 5.2 oz)  BMI 17.63 kg/m2  SpO2 100%  LMP 11/16/2014  Intake/Output Summary (Last 24 hours) at 03/11/15 0716 Last data filed at 03/10/15 2330  Gross per 24 hour  Intake    670 ml  Output      0 ml  Net    670 ml   Weight change:  Gen: awake and alert CVS:RRR no rub Resp: Bil crackels Abd:+ BS NTND Ext: No edema NEURO:CNI Ox3 + asterixis, mild myoclonus   . sodium chloride   Intravenous Once  . sodium chloride   Intravenous Once  . azithromycin  1,200 mg Oral Q Thu-1800  . feeding supplement (ENSURE ENLIVE)  237 mL Oral TID BM  . fluconazole  100 mg Oral Daily  . magnesium sulfate 1 - 4 g bolus IVPB  2 g Intravenous Once  . sodium chloride flush  3 mL Intravenous Q12H  . sulfamethoxazole-trimethoprim  1 tablet Oral Once per day on Mon Wed Fri   Ct Abdomen Pelvis Wo Contrast  03/10/2015  CLINICAL DATA:  Acute lower abdominal pain. EXAM: CT ABDOMEN AND PELVIS WITHOUT CONTRAST TECHNIQUE: Multidetector CT imaging of the abdomen and pelvis was performed following the standard protocol without IV contrast. COMPARISON:  None. FINDINGS: Bronchiectasis and scarring is noted in the right middle and left lower lobes. No significant osseous abnormality is noted. No gallstones are noted. Small right hepatic cyst is noted. The spleen and pancreas appear normal on these unenhanced images. Adrenal glands and kidneys appear normal. No hydronephrosis or renal obstruction is noted. The appendix appears normal. There is no evidence of bowel obstruction. Uterus appears normal. Left ovary appears normal. Right ovary is not well visualized. Moderate amount of free fluid is noted in the pelvis. Urinary bladder appears normal. No significant adenopathy is noted. IMPRESSION: Bronchiectasis and scarring is noted in both lung bases. Moderate amount of free fluid  is noted in the pelvis. Uterus and left ovary appear normal. Right ovary is not well visualized. Pelvic ultrasound is recommended to evaluate for possible right ovarian cyst or other pathology. Electronically Signed   By: Marijo Conception, M.D.   On: 03/10/2015 19:02   Dg Chest Port 1 View  03/10/2015  CLINICAL DATA:  Encounter for central line placement. EXAM: PORTABLE CHEST 1 VIEW COMPARISON:  Same day. FINDINGS: The heart size and mediastinal contours are within normal limits. No pneumothorax is noted. Interval placement of right internal jugular catheter with distal tip in expected position of the SVC. Mild bibasilar atelectasis is noted. Mild bilateral pleural effusions are noted. The visualized skeletal structures are unremarkable. IMPRESSION: Stable mild bibasilar atelectasis with associated pleural effusions. Interval placement of right internal jugular catheter line with distal tip in expected position of the SVC. No pneumothorax is noted. Electronically Signed   By: Marijo Conception, M.D.   On: 03/10/2015 19:49   Dg Chest Portable 1 View  03/10/2015  CLINICAL DATA:  Weakness and cough for 3-4 days. Difficulty walking. Nausea and vomiting. Diarrhea. Lower abdominal pain. HIV. EXAM: PORTABLE CHEST 1 VIEW COMPARISON:  02/06/2014 FINDINGS: Indistinct airspace opacities pressed along both hemidiaphragms along with some questionable opacity in the right upper lung at the level of the aortic arch. The lungs appear otherwise clear. Cardiac and mediastinal margins appear normal. Reverse  lordotic projection. The generalized interstitial opacity has improved compared to 02/07/15. IMPRESSION: 1. The generalized interstitial opacity shown on 02/07/2015 is improved, but there some persistent indistinct airspace opacities at both lung bases and in the right mid upper lung such that residual multilobar pneumonia is a concern. Electronically Signed   By: Van Clines M.D.   On: 03/10/2015 17:25   Dg Abd Portable  1v  03/10/2015  CLINICAL DATA:  Abdominal pain EXAM: PORTABLE ABDOMEN - 1 VIEW COMPARISON:  02/10/2015 FINDINGS: The bowel gas pattern is normal. No radio-opaque calculi or other significant radiographic abnormality are seen. IMPRESSION: Negative. Electronically Signed   By: Kathreen Devoid   On: 03/10/2015 17:24   BMET    Component Value Date/Time   NA 136 03/11/2015 0540   K 3.2* 03/11/2015 0540   CL 101 03/11/2015 0540   CO2 23 03/11/2015 0540   GLUCOSE 85 03/11/2015 0540   GLUCOSE 118 04/01/2010   BUN 34* 03/11/2015 0540   CREATININE 6.51* 03/11/2015 0540   CREATININE 1.66* 09/30/2014 1416   CALCIUM 6.5* 03/11/2015 0540   GFRNONAA 7* 03/11/2015 0540   GFRNONAA 46* 10/08/2012 1058   GFRAA 8* 03/11/2015 0540   GFRAA 53* 10/08/2012 1058   CBC    Component Value Date/Time   WBC 5.4 03/11/2015 0540   RBC 3.36* 03/11/2015 0540   RBC 3.01* 02/07/2015 0900   HGB 9.3* 03/11/2015 0540   HCT 26.0* 03/11/2015 0540   PLT 227 03/11/2015 0540   MCV 77.4* 03/11/2015 0540   MCH 27.7 03/11/2015 0540   MCHC 35.8 03/11/2015 0540   RDW 18.8* 03/11/2015 0540   LYMPHSABS 1.0 02/07/2015 0443   MONOABS 1.2* 02/07/2015 0443   EOSABS 0.0 02/07/2015 0443   BASOSABS 0.0 02/07/2015 0443     Assessment: 1. New ESRD sec HIV 2. Anemia  Sp transfusion 3. HIV 4. Sec HPTH  PTH 405 02/08/15 5. Hypokalemia,  DO NOT replace, will correct with HD  Plan: 1. Start aranesp 2. Start hectorol 3. Start renavite 4. Ask [facility] to see.  Due to her poor compliance, she will need access placed prior to DC 5. Vein map  6. HD today 7. Show dx videos 8.  Start clip    Ora Mcnatt T

## 2015-03-12 ENCOUNTER — Ambulatory Visit (HOSPITAL_COMMUNITY): Payer: Medicare Other

## 2015-03-12 DIAGNOSIS — Z992 Dependence on renal dialysis: Secondary | ICD-10-CM

## 2015-03-12 DIAGNOSIS — E440 Moderate protein-calorie malnutrition: Secondary | ICD-10-CM | POA: Diagnosis present

## 2015-03-12 DIAGNOSIS — Z9119 Patient's noncompliance with other medical treatment and regimen: Secondary | ICD-10-CM

## 2015-03-12 DIAGNOSIS — E559 Vitamin D deficiency, unspecified: Secondary | ICD-10-CM

## 2015-03-12 DIAGNOSIS — R112 Nausea with vomiting, unspecified: Secondary | ICD-10-CM

## 2015-03-12 DIAGNOSIS — R918 Other nonspecific abnormal finding of lung field: Secondary | ICD-10-CM

## 2015-03-12 DIAGNOSIS — N186 End stage renal disease: Secondary | ICD-10-CM

## 2015-03-12 DIAGNOSIS — B001 Herpesviral vesicular dermatitis: Secondary | ICD-10-CM

## 2015-03-12 DIAGNOSIS — Z21 Asymptomatic human immunodeficiency virus [HIV] infection status: Secondary | ICD-10-CM

## 2015-03-12 DIAGNOSIS — D509 Iron deficiency anemia, unspecified: Secondary | ICD-10-CM

## 2015-03-12 LAB — CBC
HCT: 28.2 % — ABNORMAL LOW (ref 36.0–46.0)
Hemoglobin: 9.7 g/dL — ABNORMAL LOW (ref 12.0–15.0)
MCH: 27.6 pg (ref 26.0–34.0)
MCHC: 34.4 g/dL (ref 30.0–36.0)
MCV: 80.1 fL (ref 78.0–100.0)
PLATELETS: 232 10*3/uL (ref 150–400)
RBC: 3.52 MIL/uL — ABNORMAL LOW (ref 3.87–5.11)
RDW: 18.7 % — AB (ref 11.5–15.5)
WBC: 4.4 10*3/uL (ref 4.0–10.5)

## 2015-03-12 LAB — HEPATITIS B SURFACE ANTIBODY,QUALITATIVE: HEP B S AB: NONREACTIVE

## 2015-03-12 LAB — COMPREHENSIVE METABOLIC PANEL
ALT: 8 U/L — ABNORMAL LOW (ref 14–54)
AST: 17 U/L (ref 15–41)
Alkaline Phosphatase: 113 U/L (ref 38–126)
Anion gap: 11 (ref 5–15)
BILIRUBIN TOTAL: 0.3 mg/dL (ref 0.3–1.2)
BUN: 23 mg/dL — AB (ref 6–20)
CO2: 25 mmol/L (ref 22–32)
Calcium: 7 mg/dL — ABNORMAL LOW (ref 8.9–10.3)
Chloride: 103 mmol/L (ref 101–111)
Creatinine, Ser: 5.17 mg/dL — ABNORMAL HIGH (ref 0.44–1.00)
GFR calc Af Amer: 11 mL/min — ABNORMAL LOW (ref >60)
GFR calc non Af Amer: 10 mL/min — ABNORMAL LOW (ref >60)
GLUCOSE: 77 mg/dL (ref 65–99)
POTASSIUM: 3.8 mmol/L (ref 3.5–5.1)
Sodium: 139 mmol/L (ref 135–145)
TOTAL PROTEIN: 6.3 g/dL — AB (ref 6.5–8.1)

## 2015-03-12 LAB — VITAMIN D 25 HYDROXY (VIT D DEFICIENCY, FRACTURES): Vit D, 25-Hydroxy: <4 ng/mL — ABNORMAL LOW (ref 30.0–100.0)

## 2015-03-12 LAB — HEPATITIS B CORE ANTIBODY, TOTAL: Hep B Core Total Ab: NEGATIVE

## 2015-03-12 LAB — PARATHYROID HORMONE, INTACT (NO CA): PTH: 269 pg/mL — AB (ref 15–65)

## 2015-03-12 LAB — HEPATITIS B SURFACE ANTIGEN: HEP B S AG: NEGATIVE

## 2015-03-12 MED ORDER — HEPARIN SODIUM (PORCINE) 5000 UNIT/ML IJ SOLN
5000.0000 [IU] | Freq: Three times a day (TID) | INTRAMUSCULAR | Status: DC
  Filled 2015-03-12 (×2): qty 1

## 2015-03-12 MED ORDER — RITONAVIR 100 MG PO TABS
100.0000 mg | ORAL_TABLET | Freq: Every day | ORAL | Status: DC
  Filled 2015-03-12 (×2): qty 1

## 2015-03-12 MED ORDER — DARUNAVIR ETHANOLATE 800 MG PO TABS
800.0000 mg | ORAL_TABLET | Freq: Every day | ORAL | Status: DC
  Administered 2015-03-18: 800 mg via ORAL
  Filled 2015-03-12 (×2): qty 1

## 2015-03-12 MED ORDER — DOLUTEGRAVIR SODIUM 50 MG PO TABS
50.0000 mg | ORAL_TABLET | Freq: Two times a day (BID) | ORAL | Status: DC
  Administered 2015-03-15 – 2015-03-18 (×2): 50 mg via ORAL
  Filled 2015-03-12 (×5): qty 1

## 2015-03-12 MED ORDER — ZIDOVUDINE 100 MG PO CAPS
300.0000 mg | ORAL_CAPSULE | Freq: Every day | ORAL | Status: DC
  Administered 2015-03-18: 300 mg via ORAL
  Filled 2015-03-12 (×7): qty 3

## 2015-03-12 MED ORDER — VALACYCLOVIR HCL 500 MG PO TABS
500.0000 mg | ORAL_TABLET | Freq: Every day | ORAL | Status: DC
  Filled 2015-03-12 (×2): qty 1

## 2015-03-12 MED ORDER — TENOFOVIR DISOPROXIL FUMARATE 300 MG PO TABS
300.0000 mg | ORAL_TABLET | ORAL | Status: DC
  Filled 2015-03-12: qty 1

## 2015-03-12 NOTE — Progress Notes (Signed)
[facility]         Reason for Consult: hiv disease in HD patient, noncompliance    Referring Physician: klima  Principal Problem:   Acute on chronic renal failure ([facility]) Active Problems:   Anemia of chronic disease   Severe protein-calorie malnutrition (H[facility])   Hypomagnesemia   Chronic cough   Diarrhea   Vitamin D deficiency   Secondary hyperparathyroidism of renal origin ([place])   AIDS (acquired immune deficiency syndrome) ([place])   Uremia   FSGS (focal segmental glomerulosclerosis)    HPI: Paula Becker is a 40 y.o. female with advanced hiv disease, CD 4 count of 40/VL 20,700 (Jan 2017) with hx of numerous NRRTI and NRTI resistance, previously on DLG/DRVr/AZT regimen in early 2016 but then has had spotty adherence and few visits back into the ID clinic with Dr. Johnnye Sima despite outreach home health community nursing. She was admitted in early January with signs attributed to worsening AKI and hcap. She is now readmitted on 2/8 with Nausea, vomiting, diarrhea, and abdominal pain 4 days. Decreased appetite.She was found to have creatinine of 16 plus an anion and non-anion gap metabolic acidosis. She was therefore admitted to the internal medicine teaching service for further evaluation and care. She also underwent emergent dialysis for uremia yesterday and has felt better. Primary team asking for ID consultation to help with management of HIV disease in setting of now being on HD.  Past hiv genotypes include:  NRTI Resistance Mutations: L74I, M184V, K219Q  NNRTI Resistance Mutations: K101E, V179F, Y181C, G190A, H221Y  Nucleoside Reverse Transcriptase Inhibitors  abacavir (ABC) High-Level Resistance  zidovudine (AZT) Susceptible  emtricitabine (FTC) High-Level Resistance  lamivudine (3TC) High-Level Resistance  tenofovir (TDF) Susceptible   Non-nucleoside Reverse Transcriptase Inhibitors  efavirenz (EFV) High-Level Resistance  etravirine (ETR) High-Level  Resistance  nevirapine (NVP) High-Level Resistance  rilpivirine (RPV) High-Level Resistance   Protease Inhibitors  atazanavir/r (ATV/r) Susceptible  darunavir/r (DRV/r) Susceptible    Past Medical History  Diagnosis Date  . HIV (human immunodeficiency virus infection) ([place])   . MRSA (methicillin resistant staph aureus) culture positive   . Necrotizing pneumonia ([place]) 07/2010  . Pneumonia 06/23/11    RLL patchy, nodular lung disease  . Anemia of chronic disease   . History of noncompliance with medical treatment   . Herpes     Allergies:  Allergies  Allergen Reactions  . Shellfish-Derived Products Swelling    Facial swelling  . Vancomycin Rash      MEDICATIONS: . sodium chloride   Intravenous Once  . sodium chloride   Intravenous Once  . azithromycin  1,200 mg Oral Q Thu-1800  . darbepoetin (ARANESP) injection - DIALYSIS  100 mcg Intravenous Q Thu-HD  . doxercalciferol  2 mcg Intravenous Q T,Th,Sa-HD  . feeding supplement (ENSURE ENLIVE)  237 mL Oral TID BM  . fluconazole  100 mg Oral Daily  . multivitamin  1 tablet Oral QHS  . sodium chloride flush  3 mL Intravenous Q12H  . sulfamethoxazole-trimethoprim  1 tablet Oral Once per day on Mon Wed Fri    Social History  Substance Use Topics  . Smoking status: Never Smoker   . Smokeless tobacco: Never Used  . Alcohol Use: No    Family History  Problem Relation Age of Onset  . Lung disease Mother     passed away at 47 with lung disease  . Hypertension Father     Review of Systems  Constitutional: Negative for fever,  positive for chills, diaphoresis, activity change, appetite change, fatigue and unexpected weight change.  HENT: Negative for congestion, sore throat, rhinorrhea, sneezing, trouble swallowing and sinus pressure.  Eyes: Negative for photophobia and visual disturbance.  Respiratory: Negative for cough, chest tightness, shortness of breath, wheezing and stridor.  Cardiovascular: Negative for chest pain,  palpitations and leg swelling.  Gastrointestinal: positive for nausea, vomiting, abdominal pain, diarrhea, constipation, blood in stool, abdominal distention and anal bleeding.  Genitourinary: Negative for dysuria, hematuria, flank pain and difficulty urinating.  Musculoskeletal: Negative for myalgias, back pain, joint swelling, arthralgias and gait problem.  Skin: Negative for color change, pallor, rash and wound.  Neurological: Negative for dizziness, tremors, weakness and light-headedness.  Hematological: Negative for adenopathy. Does not bruise/bleed easily.  Psychiatric/Behavioral: Negative for behavioral problems, confusion, sleep disturbance, dysphoric mood, decreased concentration and agitation.     OBJECTIVE: Temp:  [97 F (36.1 C)-98 F (36.7 C)] 97.5 F (36.4 C) (02/10 0627) Pulse Rate:  [81-103] 87 (02/10 0627) Resp:  [18-22] 22 (02/10 0627) BP: (105-120)/(69-79) 120/78 mmHg (02/10 0627) SpO2:  [96 %-100 %] 96 % (02/10 0627) Weight:  [124 lb 12.5 oz (56.6 kg)-125 lb 7.1 oz (56.9 kg)] 125 lb 7.1 oz (56.9 kg) (02/10 0500) Physical Exam  Constitutional:  oriented to person, place, and time. appears chronically ill. No distress.  HENT: [place]/AT, PERRLA, no scleral icterus Mouth/Throat: Oropharynx is clear and moist. No oropharyngeal exudate.  Cardiovascular: Normal rate, regular rhythm and normal heart sounds. Exam reveals no gallop and no friction rub.  No murmur heard.  Pulmonary/Chest: Effort normal and breath sounds normal. No respiratory distress.  has no wheezes.  Neck = supple, no nuchal rigidity, R-IJ temp HD catheter Abdominal: Soft. Bowel sounds are normal.  exhibits no distension. There is no tenderness.  Lymphadenopathy: no cervical adenopathy. No axillary adenopathy Neurological: alert and oriented to person, place, and time.  Skin: Skin is warm and dry. No rash noted. No erythema.  Psychiatric: simple affect.  LABS: Results for orders placed or performed during  the hospital encounter of 03/10/15 (from the past 48 hour(s))  Blood culture (routine x 2)     Status: None (Preliminary result)   Collection Time: 03/10/15  4:55 PM  Result Value Ref Range   Specimen Description BLOOD RIGHT ANTECUBITAL    Special Requests IN PEDIATRIC BOTTLE 4CC    Culture NO GROWTH 2 DAYS    Report Status PENDING   I-Stat venous blood gas, ED     Status: Abnormal   Collection Time: 03/10/15  5:03 PM  Result Value Ref Range   pH, Ven 7.207 (L) 7.250 - 7.300   pCO2, Ven 24.4 (L) 45.0 - 50.0 mmHg   pO2, Ven 23.0 (LL) 30.0 - 45.0 mmHg   Bicarbonate 9.7 (L) 20.0 - 24.0 mEq/L   TCO2 10 0 - 100 mmol/L   O2 Saturation 31.0 %   Acid-base deficit 17.0 (H) 0.0 - 2.0 mmol/L   Patient temperature 98.5 F    Collection site RADIAL, ALLEN'S TEST ACCEPTABLE    Drawn by RT    Sample type MIXED VENOUS SAMPLE    Comment NOTIFIED PHYSICIAN   I-Stat CG4 Lactic Acid, ED     Status: None   Collection Time: 03/10/15  5:08 PM  Result Value Ref Range   Lactic Acid, Venous 1.84 0.5 - 2.0 mmol/L  POC occult blood, ED Provider will collect     Status: Abnormal   Collection Time: 03/10/15  5:11 PM  Result  Value Ref Range   Fecal Occult Bld POSITIVE (A) NEGATIVE  Type and screen [facility]     Status: None   Collection Time: 03/10/15  6:00 PM  Result Value Ref Range   ABO/RH(D) B POS    Antibody Screen NEG    Sample Expiration 03/13/2015    Unit Number [id number]    Blood Component Type RED CELLS,LR    Unit division 00    Status of Unit ISSUED,FINAL    Transfusion Status OK TO TRANSFUSE    Crossmatch Result Compatible    Unit Number [id number]    Blood Component Type RED CELLS,LR    Unit division 00    Status of Unit ISSUED,FINAL    Transfusion Status OK TO TRANSFUSE    Crossmatch Result Compatible   Prepare RBC     Status: None   Collection Time: 03/10/15  6:00 PM  Result Value Ref Range   Order Confirmation ORDER PROCESSED BY BLOOD BANK   Blood  culture (routine x 2)     Status: None (Preliminary result)   Collection Time: 03/10/15  6:04 PM  Result Value Ref Range   Specimen Description BLOOD RIGHT HAND    Special Requests IN PEDIATRIC BOTTLE 4CC    Culture NO GROWTH 2 DAYS    Report Status PENDING   I-Stat CG4 Lactic Acid, ED     Status: Abnormal   Collection Time: 03/10/15  6:13 PM  Result Value Ref Range   Lactic Acid, Venous 2.03 (HH) 0.5 - 2.0 mmol/L   Comment NOTIFIED PHYSICIAN   VITAMIN D 25 Hydroxy (Vit-D Deficiency, Fractures)     Status: Abnormal   Collection Time: 03/10/15  8:26 PM  Result Value Ref Range   Vit D, 25-Hydroxy <4.0 (L) 30.0 - 100.0 ng/mL    Comment: (NOTE) Vitamin D deficiency has been defined by the Institute of Medicine and an Endocrine Society practice guideline as a level of serum 25-OH vitamin D less than 20 ng/mL (1,2). The Endocrine Society went on to further define vitamin D insufficiency as a level between 21 and 29 ng/mL (2). 1. IOM (Institute of Medicine). 2010. Dietary reference   intakes for calcium and D. [place]: The   [employer]. 2. Holick MF, B[place]ley [place], Bischoff-Ferrari HA, et al.   Evaluation, treatment, and prevention of vitamin D   deficiency: an Endocrine Society clinical practice   guideline. JCEM. 2011 Jul; 96(7):1911-30. Performed At: [facility] [address] [place], [place] [id number] Mila Homer MD [id number]   Hepatitis B surface antigen     Status: None   Collection Time: 03/10/15  8:27 PM  Result Value Ref Range   Hepatitis B Surface Ag Negative Negative    Comment: (NOTE) Performed At: [facility] [address] [place], [place] [id number] Mila Homer MD [id number]   Hepatitis B surface antibody     Status: None   Collection Time: 03/10/15  8:27 PM  Result Value Ref Range   Hep B S Ab Non Reactive     Comment: (NOTE)              Non Reactive: Inconsistent with immunity,                             less than 10 mIU/mL              Reactive:     Consistent with immunity,  greater than 9.9 mIU/mL Performed At: [facility] [address] [place], [place] [id number] Mila Homer MD [id number]   Hepatitis B core antibody, total     Status: None   Collection Time: 03/10/15  8:27 PM  Result Value Ref Range   Hep B Core Total Ab Negative Negative    Comment: (NOTE) Performed At: [facility] [address] [place], [place] [id number] Mila Homer MD RN:[id number]   Prepare RBC     Status: None   Collection Time: 03/10/15  9:00 PM  Result Value Ref Range   Order Confirmation ORDER PROCESSED BY BLOOD BANK   Parathyroid hormone, intact (no Ca)     Status: Abnormal   Collection Time: 03/11/15  4:55 AM  Result Value Ref Range   PTH 269 (H) 15 - 65 pg/mL    Comment: (NOTE) Performed At: [facility] [address] [place], [place] [id number] Mila Homer MD [id number]   Lactic acid, plasma     Status: None   Collection Time: 03/11/15  5:30 AM  Result Value Ref Range   Lactic Acid, Venous 0.8 0.5 - 2.0 mmol/L  Iron and TIBC     Status: None   Collection Time: 03/11/15  5:40 AM  Result Value Ref Range   Iron 38 28 - 170 ug/dL   TIBC NOT CALCULATED 677 - 450 ug/dL    Comment: TRANSFERRIN <70 mg/dL   Saturation Ratios NOT CALCULATED 10.4 - 31.8 %   UIBC NOT CALCULATED ug/dL  Ferritin     Status: Abnormal   Collection Time: 03/11/15  5:40 AM  Result Value Ref Range   Ferritin 724 (H) 11 - 307 ng/mL  Renal function panel     Status: Abnormal   Collection Time: 03/11/15  5:40 AM  Result Value Ref Range   Sodium 136 135 - 145 mmol/L   Potassium 3.2 (L) 3.5 - 5.1 mmol/L    Comment: DELTA CHECK NOTED   Chloride 101 101 - 111 mmol/L   CO2 23 22 - 32 mmol/L   Glucose, Bld 85 65 - 99 mg/dL   BUN 34 (H) 6 - 20 mg/dL   Creatinine, Ser 1.37 (H) 0.44 - 1.00 mg/dL    Comment: DELTA CHECK NOTED   Calcium 6.5 (L) 8.9 -  10.3 mg/dL   Phosphorus 5.6 (H) 2.5 - 4.6 mg/dL   Albumin <0.7 (L) 3.5 - 5.0 g/dL    Comment: CONSISTENT WITH PREVIOUS RESULT   GFR calc non Af Amer 7 (L) >60 mL/min   GFR calc Af Amer 8 (L) >60 mL/min    Comment: (NOTE) The eGFR has been calculated using the CKD EPI equation. This calculation has not been validated in all clinical situations. eGFR's persistently <60 mL/min signify possible Chronic Kidney Disease.    Anion gap 12 5 - 15  CBC     Status: Abnormal   Collection Time: 03/11/15  5:40 AM  Result Value Ref Range   WBC 5.4 4.0 - 10.5 K/uL    Comment: REPEATED TO VERIFY   RBC 3.36 (L) 3.87 - 5.11 MIL/uL   Hemoglobin 9.3 (L) 12.0 - 15.0 g/dL    Comment: REPEATED TO VERIFY POST TRANSFUSION SPECIMEN    HCT 26.0 (L) 36.0 - 46.0 %   MCV 77.4 (L) 78.0 - 100.0 fL    Comment: POST TRANSFUSION SPECIMEN   MCH 27.7 26.0 - 34.0 pg   MCHC 35.8 30.0 - 36.0 g/dL   RDW 40.8 (H)  11.5 - 15.5 %   Platelets 227 150 - 400 K/uL  Prealbumin     Status: Abnormal   Collection Time: 03/11/15  5:40 AM  Result Value Ref Range   Prealbumin 2.8 (L) 18 - 38 mg/dL  Magnesium     Status: Abnormal   Collection Time: 03/11/15  5:40 AM  Result Value Ref Range   Magnesium 1.5 (L) 1.7 - 2.4 mg/dL  Protime-INR     Status: Abnormal   Collection Time: 03/11/15  5:40 AM  Result Value Ref Range   Prothrombin Time 17.0 (H) 11.6 - 15.2 seconds   INR 1.37 0.00 - 1.49  APTT     Status: None   Collection Time: 03/11/15  5:40 AM  Result Value Ref Range   aPTT 37 24 - 37 seconds    Comment:        IF BASELINE aPTT IS ELEVATED, SUGGEST PATIENT RISK ASSESSMENT BE USED TO DETERMINE APPROPRIATE ANTICOAGULANT THERAPY.   CBC     Status: Abnormal   Collection Time: 03/12/15  6:44 AM  Result Value Ref Range   WBC 4.4 4.0 - 10.5 K/uL   RBC 3.52 (L) 3.87 - 5.11 MIL/uL   Hemoglobin 9.7 (L) 12.0 - 15.0 g/dL   HCT 93.8 (L) 18.2 - 99.3 %   MCV 80.1 78.0 - 100.0 fL   MCH 27.6 26.0 - 34.0 pg   MCHC 34.4 30.0 -  36.0 g/dL   RDW 71.6 (H) 96.7 - 89.3 %   Platelets 232 150 - 400 K/uL  Comprehensive metabolic panel     Status: Abnormal   Collection Time: 03/12/15  6:44 AM  Result Value Ref Range   Sodium 139 135 - 145 mmol/L   Potassium 3.8 3.5 - 5.1 mmol/L   Chloride 103 101 - 111 mmol/L   CO2 25 22 - 32 mmol/L   Glucose, Bld 77 65 - 99 mg/dL   BUN 23 (H) 6 - 20 mg/dL   Creatinine, Ser 8.10 (H) 0.44 - 1.00 mg/dL   Calcium 7.0 (L) 8.9 - 10.3 mg/dL   Total Protein 6.3 (L) 6.5 - 8.1 g/dL   Albumin <1.7 (L) 3.5 - 5.0 g/dL    Comment: REPEATED TO VERIFY   AST 17 15 - 41 U/L   ALT 8 (L) 14 - 54 U/L   Alkaline Phosphatase 113 38 - 126 U/L   Total Bilirubin 0.3 0.3 - 1.2 mg/dL   GFR calc non Af Amer 10 (L) >60 mL/min   GFR calc Af Amer 11 (L) >60 mL/min    Comment: (NOTE) The eGFR has been calculated using the CKD EPI equation. This calculation has not been validated in all clinical situations. eGFR's persistently <60 mL/min signify possible Chronic Kidney Disease.    Anion gap 11 5 - 15    MICRO: 2/8 blood cx pending IMAGING: Ct Abdomen Pelvis Wo Contrast  03/10/2015  CLINICAL DATA:  Acute lower abdominal pain. EXAM: CT ABDOMEN AND PELVIS WITHOUT CONTRAST TECHNIQUE: Multidetector CT imaging of the abdomen and pelvis was performed following the standard protocol without IV contrast. COMPARISON:  None. FINDINGS: Bronchiectasis and scarring is noted in the right middle and left lower lobes. No significant osseous abnormality is noted. No gallstones are noted. Small right hepatic cyst is noted. The spleen and pancreas appear normal on these unenhanced images. Adrenal glands and kidneys appear normal. No hydronephrosis or renal obstruction is noted. The appendix appears normal. There is no evidence of bowel obstruction. Uterus appears  normal. Left ovary appears normal. Right ovary is not well visualized. Moderate amount of free fluid is noted in the pelvis. Urinary bladder appears normal. No significant  adenopathy is noted. IMPRESSION: Bronchiectasis and scarring is noted in both lung bases. Moderate amount of free fluid is noted in the pelvis. Uterus and left ovary appear normal. Right ovary is not well visualized. Pelvic ultrasound is recommended to evaluate for possible right ovarian cyst or other pathology. Electronically Signed   By: Marijo Conception, M.D.   On: 03/10/2015 19:02   Dg Chest Port 1 View  03/10/2015  CLINICAL DATA:  Encounter for central line placement. EXAM: PORTABLE CHEST 1 VIEW COMPARISON:  Same day. FINDINGS: The heart size and mediastinal contours are within normal limits. No pneumothorax is noted. Interval placement of right internal jugular catheter with distal tip in expected position of the SVC. Mild bibasilar atelectasis is noted. Mild bilateral pleural effusions are noted. The visualized skeletal structures are unremarkable. IMPRESSION: Stable mild bibasilar atelectasis with associated pleural effusions. Interval placement of right internal jugular catheter line with distal tip in expected position of the SVC. No pneumothorax is noted. Electronically Signed   By: Marijo Conception, M.D.   On: 03/10/2015 19:49   Dg Chest Portable 1 View  03/10/2015  CLINICAL DATA:  Weakness and cough for 3-4 days. Difficulty walking. Nausea and vomiting. Diarrhea. Lower abdominal pain. HIV. EXAM: PORTABLE CHEST 1 VIEW COMPARISON:  02/06/2014 FINDINGS: Indistinct airspace opacities pressed along both hemidiaphragms along with some questionable opacity in the right upper lung at the level of the aortic arch. The lungs appear otherwise clear. Cardiac and mediastinal margins appear normal. Reverse lordotic projection. The generalized interstitial opacity has improved compared to 02/07/15. IMPRESSION: 1. The generalized interstitial opacity shown on 02/07/2015 is improved, but there some persistent indistinct airspace opacities at both lung bases and in the right mid upper lung such that residual multilobar  pneumonia is a concern. Electronically Signed   By: Van Clines M.D.   On: 03/10/2015 17:25   Dg Abd Portable 1v  03/10/2015  CLINICAL DATA:  Abdominal pain EXAM: PORTABLE ABDOMEN - 1 VIEW COMPARISON:  02/10/2015 FINDINGS: The bowel gas pattern is normal. No radio-opaque calculi or other significant radiographic abnormality are seen. IMPRESSION: Negative. Electronically Signed   By: Kathreen Devoid   On: 03/10/2015 17:24   2/8 cxr appears to have still persistent airspace disease bilateral lower lung fields, though improved from 1 month ago.  Assessment/Plan:  40yo F with advanced HIV disease, noncompliance, now with ESRD on HD. Will need to be on salvage regimen given her genotype  - recommend to start on the following:  tivicay '50mg'$  BID ( need to do twice a day due to HD potentially decreasing drug concentration) darunavir '800mg'$  daily Ritonavir '100mg'$  daily Zidovudine '300mg'$  daily tenofovir '300mg'$  WEEKLY  For OI proph: Azithromycin '1200mg'$  WEEKLY Bactrim SS after HD sessions TIW fluconazole '100mg'$  daily  ESRD on HD: appreciate primary and renal team in management of electrolyte imbalance and secondary associated complications  Nausea/vomiting = likely due to uremia, will follow up with her symptoms  pathcy infiltrate on cxr = likely sequelae from her prior infection  hsv skin lesions = valtrex '500mg'$  daily (after hd on hd days) x 7 days.  When she is going home. May need to consider blister pack or education with pharmayc with calendar and pill box since i am concern about her literacy and comprehension of the med schedule.  Elzie Rings  [facility] [phone]

## 2015-03-12 NOTE — Progress Notes (Signed)
S: Has an appetite O:BP 120/78 mmHg  Pulse 87  Temp(Src) 97.5 F (36.4 C) (Oral)  Resp 22  Ht 5\' 11"  (1.803 m)  Wt 56.9 kg (125 lb 7.1 oz)  BMI 17.50 kg/m2  SpO2 96%  LMP 11/16/2014  Intake/Output Summary (Last 24 hours) at 03/12/15 0800 Last data filed at 03/11/15 2354  Gross per 24 hour  Intake    323 ml  Output      0 ml  Net    323 ml   Weight change: -6.7 kg (-14 lb 12.3 oz) Gen: awake and alert CVS:RRR no rub Resp: Bil crackels Abd:+ BS NTND Ext: No edema NEURO:CNI Ox3 no asterixis   . sodium chloride   Intravenous Once  . sodium chloride   Intravenous Once  . azithromycin  1,200 mg Oral Q Thu-1800  . darbepoetin (ARANESP) injection - DIALYSIS  100 mcg Intravenous Q Thu-HD  . doxercalciferol  2 mcg Intravenous Q T,Th,Sa-HD  . feeding supplement (ENSURE ENLIVE)  237 mL Oral TID BM  . fluconazole  100 mg Oral Daily  . multivitamin  1 tablet Oral QHS  . sodium chloride flush  3 mL Intravenous Q12H  . sulfamethoxazole-trimethoprim  1 tablet Oral Once per day on Mon Wed Fri   Ct Abdomen Pelvis Wo Contrast  03/10/2015  CLINICAL DATA:  Acute lower abdominal pain. EXAM: CT ABDOMEN AND PELVIS WITHOUT CONTRAST TECHNIQUE: Multidetector CT imaging of the abdomen and pelvis was performed following the standard protocol without IV contrast. COMPARISON:  None. FINDINGS: Bronchiectasis and scarring is noted in the right middle and left lower lobes. No significant osseous abnormality is noted. No gallstones are noted. Small right hepatic cyst is noted. The spleen and pancreas appear normal on these unenhanced images. Adrenal glands and kidneys appear normal. No hydronephrosis or renal obstruction is noted. The appendix appears normal. There is no evidence of bowel obstruction. Uterus appears normal. Left ovary appears normal. Right ovary is not well visualized. Moderate amount of free fluid is noted in the pelvis. Urinary bladder appears normal. No significant adenopathy is noted.  IMPRESSION: Bronchiectasis and scarring is noted in both lung bases. Moderate amount of free fluid is noted in the pelvis. Uterus and left ovary appear normal. Right ovary is not well visualized. Pelvic ultrasound is recommended to evaluate for possible right ovarian cyst or other pathology. Electronically Signed   By: Marijo Conception, M.D.   On: 03/10/2015 19:02   Dg Chest Port 1 View  03/10/2015  CLINICAL DATA:  Encounter for central line placement. EXAM: PORTABLE CHEST 1 VIEW COMPARISON:  Same day. FINDINGS: The heart size and mediastinal contours are within normal limits. No pneumothorax is noted. Interval placement of right internal jugular catheter with distal tip in expected position of the SVC. Mild bibasilar atelectasis is noted. Mild bilateral pleural effusions are noted. The visualized skeletal structures are unremarkable. IMPRESSION: Stable mild bibasilar atelectasis with associated pleural effusions. Interval placement of right internal jugular catheter line with distal tip in expected position of the SVC. No pneumothorax is noted. Electronically Signed   By: Marijo Conception, M.D.   On: 03/10/2015 19:49   Dg Chest Portable 1 View  03/10/2015  CLINICAL DATA:  Weakness and cough for 3-4 days. Difficulty walking. Nausea and vomiting. Diarrhea. Lower abdominal pain. HIV. EXAM: PORTABLE CHEST 1 VIEW COMPARISON:  02/06/2014 FINDINGS: Indistinct airspace opacities pressed along both hemidiaphragms along with some questionable opacity in the right upper lung at the level of the  aortic arch. The lungs appear otherwise clear. Cardiac and mediastinal margins appear normal. Reverse lordotic projection. The generalized interstitial opacity has improved compared to 02/07/15. IMPRESSION: 1. The generalized interstitial opacity shown on 02/07/2015 is improved, but there some persistent indistinct airspace opacities at both lung bases and in the right mid upper lung such that residual multilobar pneumonia is a  concern. Electronically Signed   By: Van Clines M.D.   On: 03/10/2015 17:25   Dg Abd Portable 1v  03/10/2015  CLINICAL DATA:  Abdominal pain EXAM: PORTABLE ABDOMEN - 1 VIEW COMPARISON:  02/10/2015 FINDINGS: The bowel gas pattern is normal. No radio-opaque calculi or other significant radiographic abnormality are seen. IMPRESSION: Negative. Electronically Signed   By: Kathreen Devoid   On: 03/10/2015 17:24   BMET    Component Value Date/Time   NA 136 03/11/2015 0540   K 3.2* 03/11/2015 0540   CL 101 03/11/2015 0540   CO2 23 03/11/2015 0540   GLUCOSE 85 03/11/2015 0540   GLUCOSE 118 04/01/2010   BUN 34* 03/11/2015 0540   CREATININE 6.51* 03/11/2015 0540   CREATININE 1.66* 09/30/2014 1416   CALCIUM 6.5* 03/11/2015 0540   GFRNONAA 7* 03/11/2015 0540   GFRNONAA 46* 10/08/2012 1058   GFRAA 8* 03/11/2015 0540   GFRAA 53* 10/08/2012 1058   CBC    Component Value Date/Time   WBC 5.4 03/11/2015 0540   RBC 3.36* 03/11/2015 0540   RBC 3.01* 02/07/2015 0900   HGB 9.3* 03/11/2015 0540   HCT 26.0* 03/11/2015 0540   PLT 227 03/11/2015 0540   MCV 77.4* 03/11/2015 0540   MCH 27.7 03/11/2015 0540   MCHC 35.8 03/11/2015 0540   RDW 18.8* 03/11/2015 0540   LYMPHSABS 1.0 02/07/2015 0443   MONOABS 1.2* 02/07/2015 0443   EOSABS 0.0 02/07/2015 0443   BASOSABS 0.0 02/07/2015 0443     Assessment: 1. New ESRD sec HIV 2. Anemia  Sp transfusion.  On aranesp 3. HIV 4. Sec HPTH  PTH 405 02/08/15, on hectorol  PLAN: 1.  Will do HD tomorrow and again on Mon.  Labs today P 2. Note that PC and AVF/AVG can't be placed until Tuesday 3.  Needs PT 4. Could change to renal diet   Colden Samaras T

## 2015-03-12 NOTE — Progress Notes (Signed)
An IV access noted on pt's L wrist on assessment. Per pt, it was put in before the no venipuncture order. Dr. Benjamine Mola paged and IV removed per order.

## 2015-03-12 NOTE — Progress Notes (Signed)
VASCULAR LAB PRELIMINARY  PRELIMINARY  PRELIMINARY  PRELIMINARY  Bilateral  Upper Extremity Vein Map  And Upper extremity arterial for HD access   Arterial:  Bilateral:  No evidence of stenosis or significant plaque.  Waveforms normal bilaterally.    Cephalic  Segment Diameter Depth Comment  1. Axilla 1.55mm 3.75mm   2. Mid upper arm 0.58mm 4.69mm   3. Above AC 0.29mm 3.57mm   4. In AC mm mm    IV  5. Below AC mm mm   6. Mid forearm 1.75mm 1.30mm   7. Wrist 0.73mm 2.88mm    mm mm    mm mm    mm mm    Basilic  Segment Diameter Depth Comment  1. Axilla 1.50mm 7.13mm   2. Mid upper arm 2.1mm 5.4mm   3. Above AC 2.48mm 9.43mm   4. In AC mm mm   Too small  5. Below AC mm mm   6. Mid forearm mm mm   7. Wrist mm mm    mm mm    mm mm    mm mm        Cephalic  Segment Diameter Depth Comment  1. Axilla 1.69mm 5.35mm   2. Mid upper arm 1.23mm 7.16mm   3. Above AC 2.61mm 12.45mm   4. In AC mm mm   5. Below AC 0.72mm mm   6. Mid forearm mm mm   Too small  7. Wrist mm mm    mm mm    mm mm    mm mm    Basilic  Segment Diameter Depth Comment  1. Axilla mm mm   2. Mid upper arm 1.46mm 8.73mm   3. Above [facility] 1.40mm 10.44mm   4. In AC mm mm   Too small   5. Below AC mm mm   6. Mid forearm mm mm   Interstitial fluid through the forearm  7. Wrist mm mm    mm mm    mm mm    mm mm     Paula Becker, RVT 03/12/2015, 12:07 PM

## 2015-03-12 NOTE — Progress Notes (Signed)
Initial Nutrition Assessment  DOCUMENTATION CODES:   Non-severe (moderate) malnutrition in context of chronic illness, Underweight  INTERVENTION:   -Boost Breeze po TID, each supplement provides 250 kcal and 9 grams of protein  NUTRITION DIAGNOSIS:   Malnutrition related to chronic illness as evidenced by mild depletion of body fat, mild depletion of muscle mass.  GOAL:   Patient will meet greater than or equal to 90% of their needs  MONITOR:   PO intake, Supplement acceptance, Labs, Weight trends, Skin, I & O's  REASON FOR ASSESSMENT:   Consult Assessment of nutrition requirement/status  ASSESSMENT:   Ms. 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 In addition, she has a several week history of worsening appetite. She was found in acute on chronic renal failure and uremia requiring emergent dialysis. After the first session her symptoms were improved.  Pt admitted with acute on chronic renal failure and acute uremia.   Nephrology following; noted last HD was 03/10/15.  Spoke with pt at bedside, who was not very engaged in conversation and would answer most questions with close ended questions. Pt reports no changes in her appetite (which is contraindicatory of chart review). She reveals that she consumed only a pancake this morning. PTA, she estimates that she was consuming one meal per day (pasta).   Pt reveals that she has always been petite and small framed. She reveals UBW is 125#. Reviewed wt hx, which revealed wt ranges between 125-130#. Wt changes not significant for time frame.   Nutrition-Focused physical exam completed. Findings are mild fat depletion, mild muscle depletion, and no edema.   Pt is refusing Ensure supplements. Pt reports she enjoys drinking juices. Discussed importance of maximizing nutritional intake for healing/ Pt is  amenable to Boost Breeze supplement.   Labs reviewed.  Diet Order:  Diet renal with fluid restriction Fluid restriction:: 1200 mL Fluid; Room service appropriate?: Yes; Fluid consistency:: Thin  Skin:  Reviewed, no issues  Last BM:  03/10/15  Height:   Ht Readings from Last 1 Encounters:  03/11/15 5\' 11"  (1.803 m)    Weight:   Wt Readings from Last 1 Encounters:  03/12/15 125 lb 7.1 oz (56.9 kg)    Ideal Body Weight:  70.5 kg  BMI:  Body mass index is 17.5 kg/(m^2).  Estimated Nutritional Needs:   Kcal:  1700-1900  Protein:  85-100 grams  Fluid:  per MD  EDUCATION NEEDS:   Education needs addressed  Gaynelle Pastrana A. Jimmye Norman, RD, LDN, CDE Pager: [phone] After hours Pager: [phone]

## 2015-03-12 NOTE — Progress Notes (Signed)
Internal Medicine Attending  Date: 03/12/2015  Patient name: Paula Becker Medical record number: [id number] Date of birth: 1975/03/27 Age: 40 y.o. Gender: female  I saw and evaluated the patient. I reviewed the resident's note by Dr. Marvel Plan and I agree with the resident's findings and plans as documented in her progress note.  [name] was resting comfortably in bed when seen on rounds. Although her affect was blunted she noted an improvement in her appetite and a decrease in her nausea. We continue to prepare her for chronic hemodialysis access and establish her at an outpatient hemodialysis unit. We appreciate infectious disease recommendations on her antiretroviral therapy. Further hemodialysis is scheduled for tomorrow which should continue to improve her uremic symptoms.

## 2015-03-12 NOTE — Progress Notes (Signed)
Subjective:  Patient was seen and examined this morning. She states she continues to have nausea without vomiting, but her diarrhea and appetite have improved. She had a formed bowel movement yesterday.   Objective: Vital signs in last 24 hours: Filed Vitals:   03/11/15 1817 03/11/15 2205 03/12/15 0500 03/12/15 0627  BP: 106/69 117/77  120/78  Pulse: 81 103  87  Temp: 97.7 F (36.5 C) 98 F (36.7 C)  97.5 F (36.4 C)  TempSrc: Oral Oral  Oral  Resp: 18 21  22   Height:      Weight:   125 lb 7.1 oz (56.9 kg)   SpO2: 100% 97%  96%   Weight change: -14 lb 12.3 oz (-6.7 kg)  Intake/Output Summary (Last 24 hours) at 03/12/15 1650 Last data filed at 03/11/15 2354  Gross per 24 hour  Intake    203 ml  Output      0 ml  Net    203 ml   General: Vital signs reviewed.  Patient is chronically ill-appearing, thin, in no acute distress and cooperative with exam.  Neck: Temporary R IJ catheter in place Cardiovascular: RRR, S1 normal, S2 normal Pulmonary/Chest: Mild inspiratory crackles in lower lung fields bilaterally, no wheezes, or rhonchi. Abdominal: Soft, mildly tender diffusely, non-distended, hyperactive BS Extremities: No lower extremity edema bilaterally, pulses symmetric and intact bilaterally.  Neurological: A&O x3, Strength 3/5 in upper and lower extremities bilaterally, non-focal, moves all extremities. Skin: HSV lesions in gluteal cleft.  Psychiatric: Depressed mood and affect.  Lab Results: Basic Metabolic Panel:  Recent Labs Lab 03/11/15 0540 03/12/15 0644  NA 136 139  K 3.2* 3.8  CL 101 103  CO2 23 25  GLUCOSE 85 77  BUN 34* 23*  CREATININE 6.51* 5.17*  CALCIUM 6.5* 7.0*  MG 1.5*  --   PHOS 5.6*  --    Liver Function Tests:  Recent Labs Lab 03/10/15 1432 03/11/15 0540 03/12/15 0644  AST 14*  --  17  ALT 8*  --  8*  ALKPHOS 112  --  113  BILITOT 0.4  --  0.3  PROT 6.6  --  6.3*  ALBUMIN <1.0* <1.0* <1.0*    Recent Labs Lab 03/10/15 1432    LIPASE 67*   CBC:  Recent Labs Lab 03/11/15 0540 03/12/15 0644  WBC 5.4 4.4  HGB 9.3* 9.7*  HCT 26.0* 28.2*  MCV 77.4* 80.1  PLT 227 232   Coagulation:  Recent Labs Lab 03/11/15 0540  LABPROT 17.0*  INR 1.37   Anemia Panel:  Recent Labs Lab 03/11/15 0540  FERRITIN 724*  TIBC NOT CALCULATED  IRON 38   Micro Results: Recent Results (from the past 240 hour(s))  Blood culture (routine x 2)     Status: None (Preliminary result)   Collection Time: 03/10/15  4:55 PM  Result Value Ref Range Status   Specimen Description BLOOD RIGHT ANTECUBITAL  Final   Special Requests IN PEDIATRIC BOTTLE 4CC  Final   Culture NO GROWTH 2 DAYS  Final   Report Status PENDING  Incomplete  Blood culture (routine x 2)     Status: None (Preliminary result)   Collection Time: 03/10/15  6:04 PM  Result Value Ref Range Status   Specimen Description BLOOD RIGHT HAND  Final   Special Requests IN PEDIATRIC BOTTLE 4CC  Final   Culture NO GROWTH 2 DAYS  Final   Report Status PENDING  Incomplete   Studies/Results: Ct Abdomen Pelvis Wo Contrast  03/10/2015  CLINICAL DATA:  Acute lower abdominal pain. EXAM: CT ABDOMEN AND PELVIS WITHOUT CONTRAST TECHNIQUE: Multidetector CT imaging of the abdomen and pelvis was performed following the standard protocol without IV contrast. COMPARISON:  None. FINDINGS: Bronchiectasis and scarring is noted in the right middle and left lower lobes. No significant osseous abnormality is noted. No gallstones are noted. Small right hepatic cyst is noted. The spleen and pancreas appear normal on these unenhanced images. Adrenal glands and kidneys appear normal. No hydronephrosis or renal obstruction is noted. The appendix appears normal. There is no evidence of bowel obstruction. Uterus appears normal. Left ovary appears normal. Right ovary is not well visualized. Moderate amount of free fluid is noted in the pelvis. Urinary bladder appears normal. No significant adenopathy is  noted. IMPRESSION: Bronchiectasis and scarring is noted in both lung bases. Moderate amount of free fluid is noted in the pelvis. Uterus and left ovary appear normal. Right ovary is not well visualized. Pelvic ultrasound is recommended to evaluate for possible right ovarian cyst or other pathology. Electronically Signed   By: Marijo Conception, M.D.   On: 03/10/2015 19:02   Dg Chest Port 1 View  03/10/2015  CLINICAL DATA:  Encounter for central line placement. EXAM: PORTABLE CHEST 1 VIEW COMPARISON:  Same day. FINDINGS: The heart size and mediastinal contours are within normal limits. No pneumothorax is noted. Interval placement of right internal jugular catheter with distal tip in expected position of the SVC. Mild bibasilar atelectasis is noted. Mild bilateral pleural effusions are noted. The visualized skeletal structures are unremarkable. IMPRESSION: Stable mild bibasilar atelectasis with associated pleural effusions. Interval placement of right internal jugular catheter line with distal tip in expected position of the SVC. No pneumothorax is noted. Electronically Signed   By: Marijo Conception, M.D.   On: 03/10/2015 19:49   Dg Chest Portable 1 View  03/10/2015  CLINICAL DATA:  Weakness and cough for 3-4 days. Difficulty walking. Nausea and vomiting. Diarrhea. Lower abdominal pain. HIV. EXAM: PORTABLE CHEST 1 VIEW COMPARISON:  02/06/2014 FINDINGS: Indistinct airspace opacities pressed along both hemidiaphragms along with some questionable opacity in the right upper lung at the level of the aortic arch. The lungs appear otherwise clear. Cardiac and mediastinal margins appear normal. Reverse lordotic projection. The generalized interstitial opacity has improved compared to 02/07/15. IMPRESSION: 1. The generalized interstitial opacity shown on 02/07/2015 is improved, but there some persistent indistinct airspace opacities at both lung bases and in the right mid upper lung such that residual multilobar pneumonia is a  concern. Electronically Signed   By: Van Clines M.D.   On: 03/10/2015 17:25   Dg Abd Portable 1v  03/10/2015  CLINICAL DATA:  Abdominal pain EXAM: PORTABLE ABDOMEN - 1 VIEW COMPARISON:  02/10/2015 FINDINGS: The bowel gas pattern is normal. No radio-opaque calculi or other significant radiographic abnormality are seen. IMPRESSION: Negative. Electronically Signed   By: Kathreen Devoid   On: 03/10/2015 17:24   Medications:  I have reviewed the patient's current medications. Prior to Admission:  Prescriptions prior to admission  Medication Sig Dispense Refill Last Dose  . alum & mag hydroxide-simeth (MAALOX/MYLANTA) 200-200-20 MG/5ML suspension Take 30 mLs by mouth every 6 (six) hours as needed for indigestion, heartburn or flatulence. (Patient not taking: Reported on 03/10/2015) 355 mL 0 Not Taking at Unknown time  . azithromycin (ZITHROMAX) 600 MG tablet Take 2 tablets (1200 mg) by mouth every Tuesday for 11 more weeks 24 tablet 0 1-2 weeks  .  fluconazole (DIFLUCAN) 100 MG tablet Take 100 mg by mouth daily.  0 1-2 weeks  . gabapentin (NEURONTIN) 300 MG capsule TAKE 1 CAPSULE BY MOUTH THREE TIMES DAILY (Patient not taking: Reported on 01/18/2015) 60 capsule 2 Not Taking at Unknown time  . hydrOXYzine (ATARAX/VISTARIL) 25 MG tablet Take 1 tablet (25 mg total) by mouth every 6 (six) hours. (Patient not taking: Reported on 01/18/2015) 20 tablet 0 Not Taking at Unknown time  . sulfamethoxazole-trimethoprim (BACTRIM,SEPTRA) 400-80 MG tablet Take 1 tablet by mouth 3 (three) times a week. For 3 weeks  0 1-2 weeks  . valACYclovir (VALTREX) 500 MG tablet Take 1 tablet (500 mg total) by mouth daily. 21 tablet 0 1-2 weeks   Scheduled Meds: . sodium chloride   Intravenous Once  . sodium chloride   Intravenous Once  . azithromycin  1,200 mg Oral Q Thu-1800  . darbepoetin (ARANESP) injection - DIALYSIS  100 mcg Intravenous Q Thu-HD  . [START ON 03/13/2015] darunavir  800 mg Oral Q breakfast  . dolutegravir   50 mg Oral BID  . doxercalciferol  2 mcg Intravenous Q T,Th,Sa-HD  . feeding supplement (ENSURE ENLIVE)  237 mL Oral TID BM  . fluconazole  100 mg Oral Daily  . heparin subcutaneous  5,000 Units Subcutaneous 3 times per day  . multivitamin  1 tablet Oral QHS  . [START ON 03/13/2015] ritonavir  100 mg Oral Q breakfast  . sodium chloride flush  3 mL Intravenous Q12H  . sulfamethoxazole-trimethoprim  1 tablet Oral Once per day on Mon Wed Fri  . tenofovir  300 mg Oral Weekly  . valACYclovir  500 mg Oral Daily  . [START ON 03/13/2015] zidovudine  300 mg Oral Q breakfast   Continuous Infusions:  PRN Meds:.sodium chloride, sodium chloride, acetaminophen **OR** acetaminophen, alteplase, heparin, lidocaine (PF), lidocaine-prilocaine, ondansetron **OR** ondansetron (ZOFRAN) IV, pentafluoroprop-tetrafluoroeth Assessment/Plan: Principal Problem:   Acute on chronic renal failure (HCC) Active Problems:   Anemia of chronic disease   Severe protein-calorie malnutrition (HCC)   Hypomagnesemia   Chronic cough   Diarrhea   Vitamin D deficiency   Secondary hyperparathyroidism of renal origin ([place])   AIDS (acquired immune deficiency syndrome) ([place])   Uremia   FSGS (focal segmental glomerulosclerosis)   Malnutrition of moderate degree  40 yo non-compliant F with AIDS (CD4 of 40) with recent CAP in January who presents with 3-7 days of n/v/d found to have acute on chronic renal failure and symptomatic anemia requiring emergent HD.   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 CKD has bee progressing over the last 4 years. This admission, patient presented with Creatinine of 16.37. Patient has a temporary right IJ catheter and went for HD yesterday. Plans are for repeat HD tomorrow and Monday. Vascular surgery was consulted and patient had vein mapping completed today. She will likely go for AVF and tunneled catheter placement on Tuesday of next week. CLIP process is pending for outpatient HD. -Nephrology following,  appreciate recommendations -Vascular Surgery following -CLIP pending -HD tomorrow and Monday -Renal diet with fluid restriction -For placement of AVF and tunneled cath next week -Repeat RFP tomorrow am -Continue Aranesp, Hectorol, MV  AIDS: Last CD4 count 40 in January 2017 with HIV RNA level of 20,700. Patient has been non-compliant with her medications in the past as she felt too weak and nauseous to take them. We have resumed her prophylactic antibiotic mediations and will defer to ID for antiviral therapy. ID has seen our patient and recommends salvage therapy  given her genotype with Tivicay 50 mg BID, Darunavir 800 mg daily, ritonavir 100 mg daily, Zidovudine 300 mg daily and Tenofovir 300 mg weekly. Will continue the below regimen for OI prophylaxis.  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  -Valtrex 500 mg daily (give after HD on HD days) x 7 days  Uremia: Improved after dialysis. Symptoms of nausea, vomiting and diarrhea have greatly improved. Patient reports increased appetite. One formed bowel movement yesterday.  -Improved -Zofran prn nausea  Microcytic Anemia: Likely secondary to CKD. Hgb on admission 5.4, 9.7 this morning s/p pRBC transfusions. -Continue to monitor intermittent CBCs  Hypokalemia: Resolved after HD. Potassium 3.8 this am.  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 Nausea, vomiting and diarrhea 2/2 uremia have improved. Patient reports increased appetite. -Renal Diet -Boost Breeze TID -Nutrition following  Vitamin D Deficiency: 25-OH Vitamin D level <4.0 on admission.  -Doxercalciferol with HD  DVT/PE ppx: Heparin SQ  TID  Dispo: Disposition is deferred at this time, awaiting improvement of current medical problems.  Anticipated discharge in approximately 3-4 day(s).   The patient does have a current PCP Campbell Riches, MD) and does not need an [facility] hospital follow-up appointment after discharge.  The patient does have transportation limitations that hinder transportation to clinic appointments.  .Services Needed at time of discharge: Y = Yes, Blank = No PT:   OT:   RN:   Equipment:   Other:     LOS: 2 days   Osa Craver, DO PGY-2 Internal Medicine Resident Pager # [phone] 03/12/2015 4:50 PM

## 2015-03-12 NOTE — Progress Notes (Signed)
   Daily Progress Note   Vein mapping suggests a L AVG will be necessary.  Will arrange RIJV temporary dialysis exchange for tunneled dialysis catheter, LUE AVG.  Risk, benefits, and alternatives to access surgery were discussed.    The patient is aware the risks include but are not limited to: bleeding, infection, steal syndrome, nerve damage, ischemic monomelic neuropathy, failure to mature, need for additional procedures, death and stroke.    The patient agrees to proceed forward with the procedure.  This is tenatively scheduled with Dr. Baltazar Najjar, MD, [facility] Office: [phone] Pager: [phone]  03/12/2015, 5:33 PM

## 2015-03-13 LAB — RENAL FUNCTION PANEL
ANION GAP: 7 (ref 5–15)
Albumin: <1 g/dL — ABNORMAL LOW (ref 3.5–5.0)
BUN: 8 mg/dL (ref 6–20)
CALCIUM: 6.8 mg/dL — AB (ref 8.9–10.3)
CO2: 28 mmol/L (ref 22–32)
CREATININE: 3.03 mg/dL — AB (ref 0.44–1.00)
Chloride: 102 mmol/L (ref 101–111)
GFR, EST AFRICAN AMERICAN: 21 mL/min — AB (ref >60)
GFR, EST NON AFRICAN AMERICAN: 18 mL/min — AB (ref >60)
Glucose, Bld: 71 mg/dL (ref 65–99)
Phosphorus: 3 mg/dL (ref 2.5–4.6)
Potassium: 3.8 mmol/L (ref 3.5–5.1)
SODIUM: 137 mmol/L (ref 135–145)

## 2015-03-13 MED ORDER — DOXERCALCIFEROL 4 MCG/2ML IV SOLN
INTRAVENOUS | Status: AC
  Administered 2015-03-13: 2 ug via INTRAVENOUS
  Filled 2015-03-13: qty 2

## 2015-03-13 MED ORDER — AZITHROMYCIN 600 MG PO TABS
1200.0000 mg | ORAL_TABLET | ORAL | Status: DC
  Filled 2015-03-13: qty 2

## 2015-03-13 MED ORDER — TENOFOVIR DISOPROXIL FUMARATE 300 MG PO TABS
300.0000 mg | ORAL_TABLET | ORAL | Status: DC
  Filled 2015-03-13: qty 1

## 2015-03-13 MED ORDER — BOOST / RESOURCE BREEZE PO LIQD
1.0000 | Freq: Three times a day (TID) | ORAL | Status: DC
  Administered 2015-03-13: 1 via ORAL

## 2015-03-13 NOTE — Progress Notes (Signed)
Pt refused all medication 03/12/15 which included her Dolutegravir andValacyclovir I explained that this was her HIV medication and Antiviral medication which is needed to help her health. She stated that she cannot take pills I offered to crush to meds and place them in applesauce she just stared and said I can't take those pills. I informed that I will print out information on the medication for her. Patient does have flat effect will continue to monitor. Arthor Captain LPN

## 2015-03-13 NOTE — Progress Notes (Signed)
Subjective:  Patient was seen and examined this morning. Patient refused her antiretroviral medications yesterday despite nursing staff crushing up her medication and putting them in applesauce. When asked this morning, patient states that she cannot swallow large pills. Otherwise, patient continues to feel weak and fatigued, but improving. Nausea, diarrhea and appetite are also improved; although, she did have two loose stools yesterday which is normal for her. She denies chest pain or shortness of breath.    Objective: Vital signs in last 24 hours: Filed Vitals:   03/13/15 0800 03/13/15 0830 03/13/15 0900 03/13/15 0930  BP: 124/80 116/81 108/76 99/73  Pulse: 80 89 96 107  Temp:      TempSrc:      Resp:      Height:      Weight:      SpO2:       Weight change: 2 lb 6.8 oz (1.1 kg) No intake or output data in the 24 hours ending 03/13/15 0957  General: Vital signs reviewed.  Patient is chronically ill-appearing, thin, in no acute distress and cooperative with exam.  Neck: R IJ cath in use with HD.  Cardiovascular: Tachycardic, regular rhythm, S1 normal, S2 normal. Pulmonary/Chest: Clear to auscultation bilaterally, no wheezes, rales, or rhonchi. Abdominal: Soft, non-tender, non-distended, BS +.  Extremities: No lower extremity edema bilaterally, pulses symmetric and intact bilaterally.  Psychiatric: Flat, depressed mood and affect.   Lab Results: Basic Metabolic Panel:  Recent Labs Lab 03/11/15 0540 03/12/15 0644  NA 136 139  K 3.2* 3.8  CL 101 103  CO2 23 25  GLUCOSE 85 77  BUN 34* 23*  CREATININE 6.51* 5.17*  CALCIUM 6.5* 7.0*  MG 1.5*  --   PHOS 5.6*  --    Liver Function Tests:  Recent Labs Lab 03/10/15 1432 03/11/15 0540 03/12/15 0644  AST 14*  --  17  ALT 8*  --  8*  ALKPHOS 112  --  113  BILITOT 0.4  --  0.3  PROT 6.6  --  6.3*  ALBUMIN <1.0* <1.0* <1.0*    Recent Labs Lab 03/10/15 1432  LIPASE 67*   CBC:  Recent Labs Lab 03/11/15 0540  03/12/15 0644  WBC 5.4 4.4  HGB 9.3* 9.7*  HCT 26.0* 28.2*  MCV 77.4* 80.1  PLT 227 232   Coagulation:  Recent Labs Lab 03/11/15 0540  LABPROT 17.0*  INR 1.37   Anemia Panel:  Recent Labs Lab 03/11/15 0540  FERRITIN 724*  TIBC NOT CALCULATED  IRON 38   Micro Results: Recent Results (from the past 240 hour(s))  Blood culture (routine x 2)     Status: None (Preliminary result)   Collection Time: 03/10/15  4:55 PM  Result Value Ref Range Status   Specimen Description BLOOD RIGHT ANTECUBITAL  Final   Special Requests IN PEDIATRIC BOTTLE 4CC  Final   Culture NO GROWTH 3 DAYS  Final   Report Status PENDING  Incomplete  Blood culture (routine x 2)     Status: None (Preliminary result)   Collection Time: 03/10/15  6:04 PM  Result Value Ref Range Status   Specimen Description BLOOD RIGHT HAND  Final   Special Requests IN PEDIATRIC BOTTLE 4CC  Final   Culture NO GROWTH 3 DAYS  Final   Report Status PENDING  Incomplete   Medications:  I have reviewed the patient's current medications. Prior to Admission:  Prescriptions prior to admission  Medication Sig Dispense Refill Last Dose  . alum &  mag hydroxide-simeth (MAALOX/MYLANTA) 200-200-20 MG/5ML suspension Take 30 mLs by mouth every 6 (six) hours as needed for indigestion, heartburn or flatulence. (Patient not taking: Reported on 03/10/2015) 355 mL 0 Not Taking at Unknown time  . azithromycin (ZITHROMAX) 600 MG tablet Take 2 tablets (1200 mg) by mouth every Tuesday for 11 more weeks 24 tablet 0 1-2 weeks  . fluconazole (DIFLUCAN) 100 MG tablet Take 100 mg by mouth daily.  0 1-2 weeks  . gabapentin (NEURONTIN) 300 MG capsule TAKE 1 CAPSULE BY MOUTH THREE TIMES DAILY (Patient not taking: Reported on 01/18/2015) 60 capsule 2 Not Taking at Unknown time  . hydrOXYzine (ATARAX/VISTARIL) 25 MG tablet Take 1 tablet (25 mg total) by mouth every 6 (six) hours. (Patient not taking: Reported on 01/18/2015) 20 tablet 0 Not Taking at Unknown  time  . sulfamethoxazole-trimethoprim (BACTRIM,SEPTRA) 400-80 MG tablet Take 1 tablet by mouth 3 (three) times a week. For 3 weeks  0 1-2 weeks  . valACYclovir (VALTREX) 500 MG tablet Take 1 tablet (500 mg total) by mouth daily. 21 tablet 0 1-2 weeks   Scheduled Meds: . sodium chloride   Intravenous Once  . sodium chloride   Intravenous Once  . azithromycin  1,200 mg Oral Q Thu-1800  . darbepoetin (ARANESP) injection - DIALYSIS  100 mcg Intravenous Q Thu-HD  . darunavir  800 mg Oral Q breakfast  . dolutegravir  50 mg Oral BID  . doxercalciferol  2 mcg Intravenous Q T,Th,Sa-HD  . feeding supplement (ENSURE ENLIVE)  237 mL Oral TID BM  . fluconazole  100 mg Oral Daily  . heparin subcutaneous  5,000 Units Subcutaneous 3 times per day  . multivitamin  1 tablet Oral QHS  . ritonavir  100 mg Oral Q breakfast  . sodium chloride flush  3 mL Intravenous Q12H  . sulfamethoxazole-trimethoprim  1 tablet Oral Once per day on Mon Wed Fri  . tenofovir  300 mg Oral Weekly  . valACYclovir  500 mg Oral Daily  . zidovudine  300 mg Oral Q breakfast   Continuous Infusions:  PRN Meds:.sodium chloride, sodium chloride, acetaminophen **OR** acetaminophen, alteplase, heparin, lidocaine (PF), lidocaine-prilocaine, ondansetron **OR** ondansetron (ZOFRAN) IV, pentafluoroprop-tetrafluoroeth Assessment/Plan: Principal Problem:   Acute on chronic renal failure (HCC) Active Problems:   Anemia of chronic disease   Hypomagnesemia   HSV (herpes simplex virus) anogenital infection   Chronic cough   Diarrhea   Vitamin D deficiency   Secondary hyperparathyroidism of renal origin ([place])   AIDS (acquired immune deficiency syndrome) ([place])   Uremia   FSGS (focal segmental glomerulosclerosis)   Malnutrition of moderate degree  40 yo non-compliant F with AIDS (CD4 of 40) with recent CAP in January who presents with 3-7 days of n/v/d found to have acute on chronic renal failure and symptomatic anemia requiring emergent  HD.   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 Patient seen this morning in HD with temporary right IJ catheter use. Plans are to repeat HD on Monday and undergo AVG in LUE and tunneled catheter placement on Tuesday. CLIP process is pending for outpatient HD. Labs were not drawn this morning. Although patient appears to be in agreement with outpatient HD, I am very fearful that she will be non-compliant given her history of non-compliance with her medications.  -Nephrology following, appreciate recommendations -Vascular Surgery following -CLIP pending -HD Monday -Renal diet with fluid restriction -For placement of AVG and tunneled cath Tuesday -Repeat RFP tomorrow am -Continue Aranesp, Hectorol, MV  AIDS: Last CD4 count  40 in January 2017 with HIV RNA level of 20,700. Patient is currently on salvage therapy with Tivicay 50 mg BID, Darunavir 800 mg daily, ritonavir 100 mg daily, Zidovudine 300 mg daily and Tenofovir 300 mg weekly; however, patient has been refusing her antiviral medications, despite crushing her medications in applesauce. Patient has a long history of non-compliance with medications and follow up, and unfortunately, this behavior is not out of her character. We will encourage the below medications.  -ID consulted, appreciate recommendations -Bactrim 3 times weekly, renally dosed -Azithromycin Qweek  -Fluconazole 100 mg daily -Tivicay 50 mg BID -Darunavir 800 mg daily -Ritonavir 100 mg daily -Zidovudine 300 mg daily -Tenofovir 300 mg weekly  HSV: Patient has recurrent HSV skin lesions notably in her gluteal cleft.  -Valtrex 500 mg daily (give after HD on HD days) x 7 days  Microcytic Anemia: Likely secondary to CKD. Hgb on admission 5.4 which improved to 9.7 yesterday s/p pRBC transfusions. 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  -Renal Diet -Boost Breeze TID -Nutrition following  Vitamin D Deficiency:  25-OH Vitamin D level <4.0 on admission.  -Doxercalciferol with HD  DVT/PE ppx: Heparin SQ TID  Dispo: Disposition is deferred at this time, awaiting improvement of current medical problems.  Anticipated discharge in approximately 3-4 day(s).   The patient does have a current PCP Campbell Riches, MD) and does not need an [facility] hospital follow-up appointment after discharge.  The patient does have transportation limitations that hinder transportation to clinic appointments.    LOS: 3 days   Osa Craver, DO PGY-2 Internal Medicine Resident Pager # [phone] 03/13/2015 9:57 AM

## 2015-03-13 NOTE — Procedures (Signed)
Pt seen on HD.  Ap 90 Vp 40.  BFR 250.  Tolerating HD well.

## 2015-03-14 DIAGNOSIS — N184 Chronic kidney disease, stage 4 (severe): Secondary | ICD-10-CM

## 2015-03-14 LAB — RENAL FUNCTION PANEL
Albumin: <1 g/dL — ABNORMAL LOW (ref 3.5–5.0)
Anion gap: 10 (ref 5–15)
BUN: 16 mg/dL (ref 6–20)
CHLORIDE: 105 mmol/L (ref 101–111)
CO2: 22 mmol/L (ref 22–32)
Calcium: 6.7 mg/dL — ABNORMAL LOW (ref 8.9–10.3)
Creatinine, Ser: 4.24 mg/dL — ABNORMAL HIGH (ref 0.44–1.00)
GFR calc Af Amer: 14 mL/min — ABNORMAL LOW (ref >60)
GFR calc non Af Amer: 12 mL/min — ABNORMAL LOW (ref >60)
GLUCOSE: 99 mg/dL (ref 65–99)
POTASSIUM: 4.4 mmol/L (ref 3.5–5.1)
Phosphorus: 4.6 mg/dL (ref 2.5–4.6)
Sodium: 137 mmol/L (ref 135–145)

## 2015-03-14 NOTE — Progress Notes (Signed)
S: Eating better but still extremely weak O:BP 148/90 mmHg  Pulse 84  Temp(Src) 98 F (36.7 C) (Oral)  Resp 16  Ht 5\' 11"  (1.803 m)  Wt 55.1 kg (121 lb 7.6 oz)  BMI 16.95 kg/m2  SpO2 100%  LMP 11/16/2014  Intake/Output Summary (Last 24 hours) at 03/14/15 0955 Last data filed at 03/14/15 0647  Gross per 24 hour  Intake    460 ml  Output   1500 ml  Net  -1040 ml   Weight change: -1.8 kg (-3 lb 15.5 oz) Gen: awake and alert CVS:RRR no rub Resp: Bil crackles improved Abd:+ BS NTND Ext: No edema NEURO:CNI Ox3 no asterixis.  Decreased muscular strength Rt Temp IJ cath   . sodium chloride   Intravenous Once  . sodium chloride   Intravenous Once  . azithromycin  1,200 mg Oral Weekly  . darbepoetin (ARANESP) injection - DIALYSIS  100 mcg Intravenous Q Thu-HD  . darunavir  800 mg Oral Q breakfast  . dolutegravir  50 mg Oral BID  . doxercalciferol  2 mcg Intravenous Q T,Th,Sa-HD  . feeding supplement  1 Container Oral TID BM  . fluconazole  100 mg Oral Daily  . heparin subcutaneous  5,000 Units Subcutaneous 3 times per day  . multivitamin  1 tablet Oral QHS  . ritonavir  100 mg Oral Q breakfast  . sodium chloride flush  3 mL Intravenous Q12H  . sulfamethoxazole-trimethoprim  1 tablet Oral Once per day on Mon Wed Fri  . tenofovir  300 mg Oral Weekly  . valACYclovir  500 mg Oral Daily  . zidovudine  300 mg Oral Q breakfast   No results found. BMET    Component Value Date/Time   NA 137 03/14/2015 0500   K 4.4 03/14/2015 0500   CL 105 03/14/2015 0500   CO2 22 03/14/2015 0500   GLUCOSE 99 03/14/2015 0500   GLUCOSE 118 04/01/2010   BUN 16 03/14/2015 0500   CREATININE 4.24* 03/14/2015 0500   CREATININE 1.66* 09/30/2014 1416   CALCIUM 6.7* 03/14/2015 0500   GFRNONAA 12* 03/14/2015 0500   GFRNONAA 46* 10/08/2012 1058   GFRAA 14* 03/14/2015 0500   GFRAA 53* 10/08/2012 1058   CBC    Component Value Date/Time   WBC 4.4 03/12/2015 0644   RBC 3.52* 03/12/2015 0644   RBC  3.01* 02/07/2015 0900   HGB 9.7* 03/12/2015 0644   HCT 28.2* 03/12/2015 0644   PLT 232 03/12/2015 0644   MCV 80.1 03/12/2015 0644   MCH 27.6 03/12/2015 0644   MCHC 34.4 03/12/2015 0644   RDW 18.7* 03/12/2015 0644   LYMPHSABS 1.0 02/07/2015 0443   MONOABS 1.2* 02/07/2015 0443   EOSABS 0.0 02/07/2015 0443   BASOSABS 0.0 02/07/2015 0443     Assessment: 1. New ESRD sec HIV 2. Anemia  Sp transfusion.  On aranesp 3. HIV 4. Sec HPTH  PTH 405 02/08/15, on hectorol  PLAN: 1.  Will do HD tomorrow .   2. Note that PC and AVF/AVG can't be placed until Tuesday      Paula Becker T

## 2015-03-14 NOTE — Progress Notes (Signed)
Subjective: No overnight events. This morning she states that she can take her pills just fine, however per conversation with her nurse this morning patient has been refusing all medications. While in the room her sister had called and asked about visitation hours as well as, whether or not she could bring food from outside(bagel and cheese) to give to Paula Becker. I discussed with her that the patient is on a renal diet and it is unlikely that she would be able to consume these types of food given that a lot of fast food is high in phosphorus, but I would obtain more information regarding this request. She also seem to have some concerns regarding the placement of AV graft and wants to know what this will entail. She seems to be in good spirits otherwise and has been in contact with her sister/friends and will likely have some visitors today.  Objective: Vital signs in last 24 hours: Filed Vitals:   03/13/15 1040 03/13/15 1243 03/13/15 2043 03/14/15 0442  BP: 111/73 133/81 153/73 148/90  Pulse: 82 84 84 84  Temp: 97.6 F (36.4 C) 98.7 F (37.1 C) 98 F (36.7 C) 98 F (36.7 C)  TempSrc: Oral  Oral Oral  Resp: 18 19 16    Height:      Weight: 54.4 kg (119 lb 14.9 oz)   55.1 kg (121 lb 7.6 oz)  SpO2: 99% 100% 100% 100%   Weight change: -1.8 kg (-3 lb 15.5 oz)  Intake/Output Summary (Last 24 hours) at 03/14/15 [id number] Last data filed at 03/14/15 0647  Gross per 24 hour  Intake    460 ml  Output   1500 ml  Net  -1040 ml   Physical Exam Physical Exam  Constitutional: She is oriented to person, place, and time. No distress.  Cachexic appearing,  HENT:  Head: Normocephalic and atraumatic.  Eyes: Conjunctivae and EOM are normal.  Cardiovascular: Normal rate, regular rhythm and normal heart sounds.   Pulmonary/Chest: Effort normal and breath sounds normal. No respiratory distress.  Abdominal: Soft. Bowel sounds are normal. She exhibits no distension.  Musculoskeletal: She exhibits no  edema.  Neurological: She is alert and oriented to person, place, and time.  Skin: Skin is warm and dry.  Slightly cracked skin in lower extremities   Psychiatric:  Appears to be in a good mood.   Lab Results: SCr= 4.24(high) this morning. 3.03 yesterday,  5.17 two days ago, lower SCr than on admission@ 16 likely due to missed HD Ca2+= 6.7 today 6.8 yesterday Phosph= 4.6 wnl Rest of electrolytes are wnl w/o anion gap.  Micro Results: Recent Results (from the past 240 hour(s))  Blood culture (routine x 2)     Status: None (Preliminary result)   Collection Time: 03/10/15  4:55 PM  Result Value Ref Range Status   Specimen Description BLOOD RIGHT ANTECUBITAL  Final   Special Requests IN PEDIATRIC BOTTLE 4CC  Final   Culture NO GROWTH 3 DAYS  Final   Report Status PENDING  Incomplete  Blood culture (routine x 2)     Status: None (Preliminary result)   Collection Time: 03/10/15  6:04 PM  Result Value Ref Range Status   Specimen Description BLOOD RIGHT HAND  Final   Special Requests IN PEDIATRIC BOTTLE 4CC  Final   Culture NO GROWTH 3 DAYS  Final   Report Status PENDING  Incomplete   Studies/Results: No results found. Medications:  Scheduled Meds: . sodium chloride   Intravenous Once  .  sodium chloride   Intravenous Once  . azithromycin  1,200 mg Oral Weekly  . darbepoetin (ARANESP) injection - DIALYSIS  100 mcg Intravenous Q Thu-HD  . darunavir  800 mg Oral Q breakfast  . dolutegravir  50 mg Oral BID  . doxercalciferol  2 mcg Intravenous Q T,Th,Sa-HD  . feeding supplement  1 Container Oral TID BM  . fluconazole  100 mg Oral Daily  . heparin subcutaneous  5,000 Units Subcutaneous 3 times per day  . multivitamin  1 tablet Oral QHS  . ritonavir  100 mg Oral Q breakfast  . sodium chloride flush  3 mL Intravenous Q12H  . sulfamethoxazole-trimethoprim  1 tablet Oral Once per day on Mon Wed Fri  . tenofovir  300 mg Oral Weekly  . valACYclovir  500 mg Oral Daily  . zidovudine  300  mg Oral Q breakfast   Continuous Infusions:  PRN Meds:.sodium chloride, sodium chloride, acetaminophen **OR** acetaminophen, alteplase, heparin, lidocaine (PF), lidocaine-prilocaine, ondansetron **OR** ondansetron (ZOFRAN) IV, pentafluoroprop-tetrafluoroeth    Assessment/Plan: Principal Problem:   Acute on chronic renal failure (HCC) Active Problems:   Anemia of chronic disease   Hypomagnesemia   HSV (herpes simplex virus) anogenital infection   Chronic cough   Diarrhea   Vitamin D deficiency   Secondary hyperparathyroidism of renal origin ([place])   AIDS (acquired immune deficiency syndrome) ([place])   Uremia   FSGS (focal segmental glomerulosclerosis)   Malnutrition of moderate degree  [name] is a  40 yo female with a history of medical non-adherence to HAART therapy , AIDS w/ (CD4 of 75) with recent CAP in January who presents with 3-7 days of n/v/d found to have acute on chronic renal failure and symptomatic anemia requiring emergent HD. She recently received pRBC transfusions to address her anemia likely due to her chronic disease/ESRD. The patient has been informed about what an AV graft is for and how it will be used to manage her ESRD.  HIVAN now on dialysis: Pt currently has temp IJ catheter in place with plans for a tunnel catheter on Tuesday, and HD on Monday. CLIP pending for outpatient HD. Patient made aware of the what AV graft consist of.  -Nephrology and VVS following and appreciate recs -HD tomorrow(Monday) -AVG placement on Tuesday -renal diet with fluid restriction -daily renal function panel -continue aranesp, hectorol, MV  AIDS: Last CD4 count 40 in January 2017 with HIV RNA level of 20,700. Patient is currently on salvage therapy with Tivicay 50 mg BID, Darunavir 800 mg daily, ritonavir 100 mg daily, Zidovudine 300 mg daily and Tenofovir 300 mg weekly; however, patient has been refusing her antiviral medications, despite nursing crushing her medications to  make it easier for her to take She also says she can not tolerate purple pill- I will look into what the purple pill is and see if it can be changed. I also think she mas major depression going on, and putting her on antidepressants may benefit her compliance in the long run. We will decide whether to consult psychiatry tomorrow.   -ID consulted, appreciate recommendations -Bactrim 3 times weekly, renally dosed -Azithromycin Qweek  -Fluconazole 100 mg daily -Tivicay 50 mg BID -Darunavir 800 mg daily -Ritonavir 100 mg daily -Zidovudine 300 mg daily -Tenofovir 300 mg weekly  HSV: Patient has recurrent HSV skin lesions notably in her gluteal cleft.  -Valtrex 500 mg daily (give after HD on HD days) x 7 days  Microcytic Anemia: Likely secondary to CKD. Hgb on  admission 5.4 which improved to 9.7 yesterday s/p pRBC transfusions. -Continue to monitor intermittent CBCs, next CBC Monday morning.  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 Patient does not want eat the hospital food but has no LOA and is willing to eat outside food.I'm concerned about worsening malnutrition, and think we could consider allowing her to eat more freely and add-on phosph binder given that she will likely eat whatever she wants on discharge. -Renal Diet -Boost Breeze TID -Nutrition following  Vitamin D Deficiency: 25-OH Vitamin D level <4.0 on admission.  -Doxercalciferol with HD to prevent secondary hyperPTH  DVT/PE ppx: Heparin SQ TID  Dispo: Disposition is deferred at this time, awaiting improvement of current medical problems. Anticipated discharge in approximately 3-4 day(s).   The patient does have a current PCP Campbell Riches, MD) and does not need an [facility] hospital follow-up appointment after discharge.  The patient does have transportation limitations that hinder transportation to clinic appointments.   This is a Careers information officer Note.  The care of the patient was discussed  with team and the assessment and plan formulated with their assistance.  Please see their attached note for official documentation of the daily encounter.   LOS: 4 days   Delena Serve., Med Student 03/14/2015, 9:28 AM

## 2015-03-14 NOTE — Progress Notes (Signed)
Pt refused all her scheduled med for the shift, pt said she will only take med that goes through the iv line, will continue to monitor

## 2015-03-14 NOTE — Progress Notes (Signed)
Subjective:  Pt says she feels better after dialysis. She says she has no problem taking oral pills, except the "purple pill" for the HIV I explained that I will look into if the "purple pill" can be changed.   However, per med history, she has been refusing most of her oral medicatins  She inquired how often she will have to do dialysis and I informed her that most likely three times a week, but compliance with antiretroviral is essential if she wants to prevent further progression of her kidney damage.  She also seems to have very labile mood as at first she refused the physical exam in a rude way, and when explained the need for it, she later smiled, and seemed very happy.   I think she may have depression going on and may benefit from psychiatry consult, whether she has decision making capacity, and whether this may be a safe discharge. Also putting her onS [place] may benefit her compliance   She also says she does not eat breakfast but she does have an appetite.     Objective: Vital signs in last 24 hours: Filed Vitals:   03/13/15 1040 03/13/15 1243 03/13/15 2043 03/14/15 0442  BP: 111/73 133/81 153/73 148/90  Pulse: 82 84 84 84  Temp: 97.6 F (36.4 C) 98.7 F (37.1 C) 98 F (36.7 C) 98 F (36.7 C)  TempSrc: Oral  Oral Oral  Resp: 18 19 16    Height:      Weight: 119 lb 14.9 oz (54.4 kg)   121 lb 7.6 oz (55.1 kg)  SpO2: 99% 100% 100% 100%   Weight change: -3 lb 15.5 oz (-1.8 kg)  Intake/Output Summary (Last 24 hours) at 03/14/15 0840 Last data filed at 03/14/15 0647  Gross per 24 hour  Intake    460 ml  Output   1500 ml  Net  -1040 ml    General: Vital signs reviewed.  Pt texting on phone, has right IJ cath CV: rrr Resp: ctab, Abd: soft, ntnd, +BS Extremities: no extremity edema Psychiatry- very labile mood. depression    Lab Results: Basic Metabolic Panel:  Recent Labs Lab 03/11/15 0540  03/13/15 1239 03/14/15 0500  NA 136  < > 137 137  K 3.2*  < >  3.8 4.4  CL 101  < > 102 105  CO2 23  < > 28 22  GLUCOSE 85  < > 71 99  BUN 34*  < > 8 16  CREATININE 6.51*  < > 3.03* 4.24*  CALCIUM 6.5*  < > 6.8* 6.7*  MG 1.5*  --   --   --   PHOS 5.6*  --  3.0 4.6  < > = values in this interval not displayed. Liver Function Tests:  Recent Labs Lab 03/10/15 1432  03/12/15 0644 03/13/15 1239 03/14/15 0500  AST 14*  --  17  --   --   ALT 8*  --  8*  --   --   ALKPHOS 112  --  113  --   --   BILITOT 0.4  --  0.3  --   --   PROT 6.6  --  6.3*  --   --   ALBUMIN <1.0*  < > <1.0* <1.0* <1.0*  < > = values in this interval not displayed.  Recent Labs Lab 03/10/15 1432  LIPASE 67*   CBC:  Recent Labs Lab 03/11/15 0540 03/12/15 0644  WBC 5.4 4.4  HGB 9.3* 9.7*  HCT 26.0* 28.2*  MCV 77.4* 80.1  PLT 227 232   Coagulation:  Recent Labs Lab 03/11/15 0540  LABPROT 17.0*  INR 1.37   Anemia Panel:  Recent Labs Lab 03/11/15 0540  FERRITIN 724*  TIBC NOT CALCULATED  IRON 38   Micro Results: Recent Results (from the past 240 hour(s))  Blood culture (routine x 2)     Status: None (Preliminary result)   Collection Time: 03/10/15  4:55 PM  Result Value Ref Range Status   Specimen Description BLOOD RIGHT ANTECUBITAL  Final   Special Requests IN PEDIATRIC BOTTLE 4CC  Final   Culture NO GROWTH 3 DAYS  Final   Report Status PENDING  Incomplete  Blood culture (routine x 2)     Status: None (Preliminary result)   Collection Time: 03/10/15  6:04 PM  Result Value Ref Range Status   Specimen Description BLOOD RIGHT HAND  Final   Special Requests IN PEDIATRIC BOTTLE 4CC  Final   Culture NO GROWTH 3 DAYS  Final   Report Status PENDING  Incomplete   Medications:  I have reviewed the patient's current medications. Prior to Admission:  Prescriptions prior to admission  Medication Sig Dispense Refill Last Dose  . alum & mag hydroxide-simeth (MAALOX/MYLANTA) 200-200-20 MG/5ML suspension Take 30 mLs by mouth every 6 (six) hours as  needed for indigestion, heartburn or flatulence. (Patient not taking: Reported on 03/10/2015) 355 mL 0 Not Taking at Unknown time  . azithromycin (ZITHROMAX) 600 MG tablet Take 2 tablets (1200 mg) by mouth every Tuesday for 11 more weeks 24 tablet 0 1-2 weeks  . fluconazole (DIFLUCAN) 100 MG tablet Take 100 mg by mouth daily.  0 1-2 weeks  . gabapentin (NEURONTIN) 300 MG capsule TAKE 1 CAPSULE BY MOUTH THREE TIMES DAILY (Patient not taking: Reported on 01/18/2015) 60 capsule 2 Not Taking at Unknown time  . hydrOXYzine (ATARAX/VISTARIL) 25 MG tablet Take 1 tablet (25 mg total) by mouth every 6 (six) hours. (Patient not taking: Reported on 01/18/2015) 20 tablet 0 Not Taking at Unknown time  . sulfamethoxazole-trimethoprim (BACTRIM,SEPTRA) 400-80 MG tablet Take 1 tablet by mouth 3 (three) times a week. For 3 weeks  0 1-2 weeks  . valACYclovir (VALTREX) 500 MG tablet Take 1 tablet (500 mg total) by mouth daily. 21 tablet 0 1-2 weeks   Scheduled Meds: . sodium chloride   Intravenous Once  . sodium chloride   Intravenous Once  . azithromycin  1,200 mg Oral Weekly  . darbepoetin (ARANESP) injection - DIALYSIS  100 mcg Intravenous Q Thu-HD  . darunavir  800 mg Oral Q breakfast  . dolutegravir  50 mg Oral BID  . doxercalciferol  2 mcg Intravenous Q T,Th,Sa-HD  . feeding supplement  1 Container Oral TID BM  . fluconazole  100 mg Oral Daily  . heparin subcutaneous  5,000 Units Subcutaneous 3 times per day  . multivitamin  1 tablet Oral QHS  . ritonavir  100 mg Oral Q breakfast  . sodium chloride flush  3 mL Intravenous Q12H  . sulfamethoxazole-trimethoprim  1 tablet Oral Once per day on Mon Wed Fri  . tenofovir  300 mg Oral Weekly  . valACYclovir  500 mg Oral Daily  . zidovudine  300 mg Oral Q breakfast   Continuous Infusions:  PRN Meds:.sodium chloride, sodium chloride, acetaminophen **OR** acetaminophen, alteplase, heparin, lidocaine (PF), lidocaine-prilocaine, ondansetron **OR** ondansetron  (ZOFRAN) IV, pentafluoroprop-tetrafluoroeth Assessment/Plan: Principal Problem:   Acute on chronic renal failure (HCC) Active Problems:  Anemia of chronic disease   Hypomagnesemia   HSV (herpes simplex virus) anogenital infection   Chronic cough   Diarrhea   Vitamin D deficiency   Secondary hyperparathyroidism of renal origin ([place])   AIDS (acquired immune deficiency syndrome) ([place])   Uremia   FSGS (focal segmental glomerulosclerosis)   Malnutrition of moderate degree  40 yo non-compliant F with AIDS (CD4 of 40) with recent CAP in January who presents with 3-7 days of n/v/d found to have acute on chronic renal failure and symptomatic anemia requiring emergent HD.   HIVAN now on dialysis: Pt currently has temp IJ catheter in place with plans for perm catheter on Tuesday, and HD on Monday. CLIP pending for outpatient HD. I question whether she will go to outpatient dialysis as pt has been persistently refusing her meds, and does not seem to have insight in her condition.   -nephrology and VVS following and appreciate recs -CLIP pending -HD Monday -AVG placement on Tuesday -renal diet with fluid restriction -daily renal function panel -continue aranesp, hectorol, MV  AIDS: Last CD4 count 40 in January 2017 with HIV RNA level of 20,700. Patient is currently on salvage therapy with Tivicay 50 mg BID, Darunavir 800 mg daily, ritonavir 100 mg daily, Zidovudine 300 mg daily and Tenofovir 300 mg weekly; however, patient has been refusing her antiviral medications, despite crushing her medications. She also says she can not tolerate purple pill- I will look into what the purple pill is and see if it can be changed. I also think she mas major depression going on, and putting her on antidepressants may benefit her compliance in the long run. We will decide whether to consult psychiatry tomorrow.   -ID consulted, appreciate recommendations -Bactrim 3 times weekly, renally dosed -Azithromycin  Qweek  -Fluconazole 100 mg daily -Tivicay 50 mg BID -Darunavir 800 mg daily -Ritonavir 100 mg daily -Zidovudine 300 mg daily -Tenofovir 300 mg weekly  -pending G6PD and genotype  HSV: Patient has recurrent HSV skin lesions notably in her gluteal cleft.  -Valtrex 500 mg daily (give after HD on HD days) x 7 days  Microcytic Anemia: Likely secondary to CKD. Hgb on admission 5.4 which improved to 9.7 yesterday s/p pRBC transfusions. 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  -Renal Diet -Boost Breeze TID -Nutrition following  Vitamin D Deficiency: 25-OH Vitamin D level <4.0 on admission.  -Doxercalciferol with HD  DVT/PE ppx: Heparin SQ TID  Dispo: Disposition is deferred at this time, awaiting improvement of current medical problems.  Anticipated discharge in approximately 3-4 day(s).   The patient does have a current PCP Campbell Riches, MD) and does not need an [facility] hospital follow-up appointment after discharge.  The patient does have transportation limitations that hinder transportation to clinic appointments.    LOS: 4 days   Signed Burgess Estelle MD 03/14/2015

## 2015-03-14 NOTE — Progress Notes (Signed)
   VASCULAR SURGERY ASSESSMENT & PLAN:  * she is scheduled for placement of access in the left arm and placement of a tunneled dialysis catheter on Tuesday. I have reviewed her vein mapping it looks like she will require an AV graft. I have explained the indications for placement of an AV fistula or AV graft. I've explained that if at all possible we will place an AV fistula.  I have reviewed the risks of placement of an AV fistula including but not limited to: failure of the fistula to mature, need for subsequent interventions, and thrombosis. In addition I have reviewed the potential complications of placement of an AV graft. These risks include, but are not limited to, graft thrombosis, graft infection, wound healing problems, bleeding, arm swelling, and steal syndrome. All the patient's questions were answered and they are agreeable to proceed with surgery.  SUBJECTIVE: No complaints.  PHYSICAL EXAM: Filed Vitals:   03/13/15 1040 03/13/15 1243 03/13/15 2043 03/14/15 0442  BP: 111/73 133/81 153/73 148/90  Pulse: 82 84 84 84  Temp: 97.6 F (36.4 C) 98.7 F (37.1 C) 98 F (36.7 C) 98 F (36.7 C)  TempSrc: Oral  Oral Oral  Resp: 18 19 16    Height:      Weight: 119 lb 14.9 oz (54.4 kg)   121 lb 7.6 oz (55.1 kg)  SpO2: 99% 100% 100% 100%   Palpable left radial pulse.   LABS: Lab Results  Component Value Date   WBC 4.4 03/12/2015   HGB 9.7* 03/12/2015   HCT 28.2* 03/12/2015   MCV 80.1 03/12/2015   PLT 232 03/12/2015   Lab Results  Component Value Date   CREATININE 4.24* 03/14/2015   Lab Results  Component Value Date   INR 1.37 03/11/2015   VEIN MAP: I have reviewed the vein mapping and she does not appear to have adequate vein for an AV fistula in either arm.  Upper extremity arterial duplex shows triphasic waveforms in both upper extremities with no evidence of significant upper extremity arterial occlusive disease.  Principal Problem:   Acute on chronic renal failure  (HCC) Active Problems:   Anemia of chronic disease   Hypomagnesemia   HSV (herpes simplex virus) anogenital infection   Chronic cough   Diarrhea   Vitamin D deficiency   Secondary hyperparathyroidism of renal origin ([place])   AIDS (acquired immune deficiency syndrome) ([place])   Uremia   FSGS (focal segmental glomerulosclerosis)   Malnutrition of moderate degree   Gae Gallop Beeper: [id number] 03/14/2015

## 2015-03-15 DIAGNOSIS — Z992 Dependence on renal dialysis: Secondary | ICD-10-CM | POA: Insufficient documentation

## 2015-03-15 LAB — CULTURE, BLOOD (ROUTINE X 2)
CULTURE: NO GROWTH
Culture: NO GROWTH

## 2015-03-15 LAB — RENAL FUNCTION PANEL
ANION GAP: 11 (ref 5–15)
Albumin: <1 g/dL — ABNORMAL LOW (ref 3.5–5.0)
BUN: 23 mg/dL — AB (ref 6–20)
CHLORIDE: 105 mmol/L (ref 101–111)
CO2: 23 mmol/L (ref 22–32)
Calcium: 7.1 mg/dL — ABNORMAL LOW (ref 8.9–10.3)
Creatinine, Ser: 5.68 mg/dL — ABNORMAL HIGH (ref 0.44–1.00)
GFR calc Af Amer: 10 mL/min — ABNORMAL LOW (ref >60)
GFR, EST NON AFRICAN AMERICAN: 9 mL/min — AB (ref >60)
Glucose, Bld: 83 mg/dL (ref 65–99)
PHOSPHORUS: 5.8 mg/dL — AB (ref 2.5–4.6)
POTASSIUM: 3.8 mmol/L (ref 3.5–5.1)
Sodium: 139 mmol/L (ref 135–145)

## 2015-03-15 MED ORDER — CEFAZOLIN SODIUM 1-5 GM-% IV SOLN
1.0000 g | INTRAVENOUS | Status: AC
  Administered 2015-03-16: 1 g via INTRAVENOUS
  Filled 2015-03-15: qty 50

## 2015-03-15 NOTE — Progress Notes (Signed)
[facility] for [facility]ious [facility]         Reason for Consult: hiv disease in HD patient, noncompliance    Referring Physician: klima  Principal Problem:   Acute on chronic renal failure ([place]) Active Problems:   Anemia of chronic disease   Hypomagnesemia   HSV (herpes simplex virus) anogenital infection   Chronic cough   Diarrhea   Vitamin D deficiency   Secondary hyperparathyroidism of renal origin ([place])   AIDS (acquired immune deficiency syndrome) ([place])   Uremia   FSGS (focal segmental glomerulosclerosis)   Malnutrition of moderate degree    ID: Paula Becker is a 40 y.o. female with advanced hiv disease, CD 4 count of 40/VL 20,700 (Jan 2017) with hx of numerous NRRTI and NRTI resistance, previously on DLG/DRVr/AZT regimen in early 2016 but then has had spotty adherence.Past hiv genotypes include:NRTI Resistance Mutations: L74I, M184V, K219Q NNRTI Resistance Mutations: K101E, V179F, Y181C, G190A, H221Y.   S: doing well, better appetite. She reports that she doesn't like the "purple pill"  Interval hx: getting permacath tomorrow  Allergies:  Allergies  Allergen Reactions  . Shellfish-Derived Products Swelling    Facial swelling  . Vancomycin Rash      MEDICATIONS: . sodium chloride   Intravenous Once  . sodium chloride   Intravenous Once  . azithromycin  1,200 mg Oral Weekly  . [START ON 03/16/2015]  ceFAZolin (ANCEF) IV  1 g Intravenous To OR  . darbepoetin (ARANESP) injection - DIALYSIS  100 mcg Intravenous Q Thu-HD  . darunavir  800 mg Oral Q breakfast  . dolutegravir  50 mg Oral BID  . doxercalciferol  2 mcg Intravenous Q T,Th,Sa-HD  . feeding supplement  1 Container Oral TID BM  . fluconazole  100 mg Oral Daily  . heparin subcutaneous  5,000 Units Subcutaneous 3 times per day  . multivitamin  1 tablet Oral QHS  . ritonavir  100 mg Oral Q breakfast  . sodium chloride flush  3 mL Intravenous Q12H  . sulfamethoxazole-trimethoprim  1 tablet Oral Once per  day on Mon Wed Fri  . tenofovir  300 mg Oral Weekly  . valACYclovir  500 mg Oral Daily  . zidovudine  300 mg Oral Q breakfast    Social History  Substance Use Topics  . Smoking status: Never Smoker   . Smokeless tobacco: Never Used  . Alcohol Use: No    Family History  Problem Relation Age of Onset  . Lung disease Mother     passed away at 77 with lung disease  . Hypertension Father     Review of Systems  Constitutional: Negative for fever diaphoresis, activity change, appetite change, fatigue and unexpected weight change.  HENT: Negative for congestion, sore throat, rhinorrhea, sneezing, trouble swallowing and sinus pressure.  Eyes: Negative for photophobia and visual disturbance.  Respiratory: Negative for cough, chest tightness, shortness of breath, wheezing and stridor.  Cardiovascular: Negative for chest pain, palpitations and leg swelling.  Gastrointestinal: positive for nausea, abdominal pain, diarrhea, constipation, blood in stool, abdominal distention and anal bleeding.  Genitourinary: Negative for dysuria, hematuria, flank pain and difficulty urinating.  Musculoskeletal: Negative for myalgias, back pain, joint swelling, arthralgias and gait problem.  Skin: Negative for color change, pallor, rash and wound.  Neurological: Negative for dizziness, tremors, weakness and light-headedness.  Hematological: Negative for adenopathy. Does not bruise/bleed easily.  Psychiatric/Behavioral: Negative for behavioral problems, confusion, sleep disturbance, dysphoric mood, decreased concentration and agitation.     OBJECTIVE:  Temp:  [97.9 F (36.6 C)-98.6 F (37 C)] 97.9 F (36.6 C) (02/13 0755) Pulse Rate:  [72-114] 95 (02/13 1100) Resp:  [14-24] 17 (02/13 1100) BP: (117-148)/(80-94) 122/85 mmHg (02/13 1100) SpO2:  [99 %-100 %] 99 % (02/13 0441) Weight:  [123 lb 7.3 oz (56 kg)-123 lb 10.9 oz (56.1 kg)] 123 lb 7.3 oz (56 kg) (02/13 0755) Physical Exam  Constitutional:   oriented to person, place, and time. appears chronically ill. No distress.  HENT: [place]/AT, PERRLA, no scleral icterus Mouth/Throat: Oropharynx is clear and moist. No oropharyngeal exudate.  Cardiovascular: Normal rate, regular rhythm and normal heart sounds. Exam reveals no gallop and no friction rub.  No murmur heard.  Pulmonary/Chest: Effort normal and breath sounds normal. No respiratory distress.  has no wheezes.  Neck = supple, no nuchal rigidity, R-IJ temp HD catheter Abdominal: Soft. Bowel sounds are normal.  exhibits no distension. There is no tenderness.  Lymphadenopathy: no cervical adenopathy. No axillary adenopathy Neurological: alert and oriented to person, place, and time.  Skin: Skin is warm and dry. No rash noted. No erythema.  Psychiatric: simple affect.  LABS: Results for orders placed or performed during the hospital encounter of 03/10/15 (from the past 48 hour(s))  Renal function panel     Status: Abnormal   Collection Time: 03/14/15  5:00 AM  Result Value Ref Range   Sodium 137 135 - 145 mmol/L   Potassium 4.4 3.5 - 5.1 mmol/L   Chloride 105 101 - 111 mmol/L   CO2 22 22 - 32 mmol/L   Glucose, Bld 99 65 - 99 mg/dL   BUN 16 6 - 20 mg/dL   Creatinine, Ser 4.24 (H) 0.44 - 1.00 mg/dL   Calcium 6.7 (L) 8.9 - 10.3 mg/dL   Phosphorus 4.6 2.5 - 4.6 mg/dL   Albumin <1.0 (L) 3.5 - 5.0 g/dL   GFR calc non Af Amer 12 (L) >60 mL/min   GFR calc Af Amer 14 (L) >60 mL/min    Comment: (NOTE) The eGFR has been calculated using the CKD EPI equation. This calculation has not been validated in all clinical situations. eGFR's persistently <60 mL/min signify possible Chronic Kidney Disease.    Anion gap 10 5 - 15  Renal function panel     Status: Abnormal   Collection Time: 03/15/15  7:50 AM  Result Value Ref Range   Sodium 139 135 - 145 mmol/L   Potassium 3.8 3.5 - 5.1 mmol/L   Chloride 105 101 - 111 mmol/L   CO2 23 22 - 32 mmol/L   Glucose, Bld 83 65 - 99 mg/dL   BUN 23 (H)  6 - 20 mg/dL   Creatinine, Ser 5.68 (H) 0.44 - 1.00 mg/dL   Calcium 7.1 (L) 8.9 - 10.3 mg/dL   Phosphorus 5.8 (H) 2.5 - 4.6 mg/dL   Albumin <1.0 (L) 3.5 - 5.0 g/dL   GFR calc non Af Amer 9 (L) >60 mL/min   GFR calc Af Amer 10 (L) >60 mL/min    Comment: (NOTE) The eGFR has been calculated using the CKD EPI equation. This calculation has not been validated in all clinical situations. eGFR's persistently <60 mL/min signify possible Chronic Kidney Disease.    Anion gap 11 5 - 15    MICRO: 2/8 blood cx pending IMAGING: No results found. 2/8 cxr appears to have still persistent airspace disease bilateral lower lung fields, though improved from 1 month ago.  Assessment/Plan:  40yo F with advanced HIV disease, noncompliance, now  with ESRD on HD. Will need to be on salvage regimen given her genotype  HIV Disease = continue on  tivicay 71m BID ( need to do twice a day due to HD potentially decreasing drug concentration) darunavir 8075mdaily Ritonavir 10097maily Zidovudine 300m72mily tenofovir 300mg6mKLY  For OI proph: Azithromycin 1200mg 65mLY Bactrim SS after HD sessions TIW fluconazole 100mg d1m  ESRD on HD: appreciate primary and renal team in management of electrolyte imbalance and secondary associated complications  Nausea/vomiting = improving  hsv skin lesions = valtrex 500mg da78m(after hd on hd days) x 7 days, possibly this is the pill that she does not like. She should finish this shortly  When she is going home. May need to consider blister pack or education with pharmayc with calendar and pill box since i am concern about her literacy and comprehension of the med schedule.  Halley Shepheard Elzie RingsMParkdaleectious Diseases [phone]

## 2015-03-15 NOTE — Care Management Important Message (Signed)
Important Message  Patient Details  Name: Paula Becker MRN: [id number] Date of Birth: 03/18/1975   Medicare Important Message Given:  Yes    Paula Becker P [place] 03/15/2015, 2:14 PM

## 2015-03-15 NOTE — Progress Notes (Signed)
Subjective:   Pt examined after she was back from dialysis at 1:15 PM. She says she feels better- she was much more interactive this morning and she had finished her lunch as the plate was empty. She says she has appetite.  She says it is a "blue pill", not the purple one that she does not like to take- I told her I will look into it.      Objective: Vital signs in last 24 hours: Filed Vitals:   03/15/15 1000 03/15/15 1030 03/15/15 1100 03/15/15 1448  BP: 121/90 121/84 122/85 126/83  Pulse: 108 100 95 89  Temp:    98.1 F (36.7 C)  TempSrc:    Oral  Resp: 24 22 17 18   Height:      Weight:      SpO2:    100%   Weight change: 7.1 oz (0.2 kg)  Intake/Output Summary (Last 24 hours) at 03/15/15 1536 Last data filed at 03/15/15 0830  Gross per 24 hour  Intake    240 ml  Output      0 ml  Net    240 ml    General: Vital signs reviewed.  Pt texting on phone, and eating lunch  CV: rrr Resp: ctab, Abd: soft, ntnd, +BS Extremities: no extremity edema Psychiatry- very interactive today , probably the best in the last 2 months     Lab Results: Basic Metabolic Panel:  Recent Labs Lab 03/11/15 0540  03/14/15 0500 03/15/15 0750  NA 136  < > 137 139  K 3.2*  < > 4.4 3.8  CL 101  < > 105 105  CO2 23  < > 22 23  GLUCOSE 85  < > 99 83  BUN 34*  < > 16 23*  CREATININE 6.51*  < > 4.24* 5.68*  CALCIUM 6.5*  < > 6.7* 7.1*  MG 1.5*  --   --   --   PHOS 5.6*  < > 4.6 5.8*  < > = values in this interval not displayed. Liver Function Tests:  Recent Labs Lab 03/10/15 1432  03/12/15 0644  03/14/15 0500 03/15/15 0750  AST 14*  --  17  --   --   --   ALT 8*  --  8*  --   --   --   ALKPHOS 112  --  113  --   --   --   BILITOT 0.4  --  0.3  --   --   --   PROT 6.6  --  6.3*  --   --   --   ALBUMIN <1.0*  < > <1.0*  < > <1.0* <1.0*  < > = values in this interval not displayed.  Recent Labs Lab 03/10/15 1432  LIPASE 67*   CBC:  Recent Labs Lab 03/11/15 0540  03/12/15 0644  WBC 5.4 4.4  HGB 9.3* 9.7*  HCT 26.0* 28.2*  MCV 77.4* 80.1  PLT 227 232   Coagulation:  Recent Labs Lab 03/11/15 0540  LABPROT 17.0*  INR 1.37   Anemia Panel:  Recent Labs Lab 03/11/15 0540  FERRITIN 724*  TIBC NOT CALCULATED  IRON 38   Micro Results: Recent Results (from the past 240 hour(s))  Blood culture (routine x 2)     Status: None   Collection Time: 03/10/15  4:55 PM  Result Value Ref Range Status   Specimen Description BLOOD RIGHT ANTECUBITAL  Final   Special Requests IN PEDIATRIC BOTTLE 4CC  Final   Culture NO GROWTH 5 DAYS  Final   Report Status 03/15/2015 FINAL  Final  Blood culture (routine x 2)     Status: None   Collection Time: 03/10/15  6:04 PM  Result Value Ref Range Status   Specimen Description BLOOD RIGHT HAND  Final   Special Requests IN PEDIATRIC BOTTLE 4CC  Final   Culture NO GROWTH 5 DAYS  Final   Report Status 03/15/2015 FINAL  Final   Medications:  I have reviewed the patient's current medications. Prior to Admission:  Prescriptions prior to admission  Medication Sig Dispense Refill Last Dose  . alum & mag hydroxide-simeth (MAALOX/MYLANTA) 200-200-20 MG/5ML suspension Take 30 mLs by mouth every 6 (six) hours as needed for indigestion, heartburn or flatulence. (Patient not taking: Reported on 03/10/2015) 355 mL 0 Not Taking at Unknown time  . azithromycin (ZITHROMAX) 600 MG tablet Take 2 tablets (1200 mg) by mouth every Tuesday for 11 more weeks 24 tablet 0 1-2 weeks  . fluconazole (DIFLUCAN) 100 MG tablet Take 100 mg by mouth daily.  0 1-2 weeks  . gabapentin (NEURONTIN) 300 MG capsule TAKE 1 CAPSULE BY MOUTH THREE TIMES DAILY (Patient not taking: Reported on 01/18/2015) 60 capsule 2 Not Taking at Unknown time  . hydrOXYzine (ATARAX/VISTARIL) 25 MG tablet Take 1 tablet (25 mg total) by mouth every 6 (six) hours. (Patient not taking: Reported on 01/18/2015) 20 tablet 0 Not Taking at Unknown time  .  sulfamethoxazole-trimethoprim (BACTRIM,SEPTRA) 400-80 MG tablet Take 1 tablet by mouth 3 (three) times a week. For 3 weeks  0 1-2 weeks  . valACYclovir (VALTREX) 500 MG tablet Take 1 tablet (500 mg total) by mouth daily. 21 tablet 0 1-2 weeks   Scheduled Meds: . sodium chloride   Intravenous Once  . sodium chloride   Intravenous Once  . azithromycin  1,200 mg Oral Weekly  . [START ON 03/16/2015]  ceFAZolin (ANCEF) IV  1 g Intravenous To OR  . darbepoetin (ARANESP) injection - DIALYSIS  100 mcg Intravenous Q Thu-HD  . darunavir  800 mg Oral Q breakfast  . dolutegravir  50 mg Oral BID  . doxercalciferol  2 mcg Intravenous Q T,Th,Sa-HD  . feeding supplement  1 Container Oral TID BM  . fluconazole  100 mg Oral Daily  . heparin subcutaneous  5,000 Units Subcutaneous 3 times per day  . multivitamin  1 tablet Oral QHS  . ritonavir  100 mg Oral Q breakfast  . sodium chloride flush  3 mL Intravenous Q12H  . sulfamethoxazole-trimethoprim  1 tablet Oral Once per day on Mon Wed Fri  . tenofovir  300 mg Oral Weekly  . valACYclovir  500 mg Oral Daily  . zidovudine  300 mg Oral Q breakfast   Continuous Infusions:  PRN Meds:.sodium chloride, sodium chloride, acetaminophen **OR** acetaminophen, alteplase, heparin, lidocaine (PF), lidocaine-prilocaine, ondansetron **OR** ondansetron (ZOFRAN) IV, pentafluoroprop-tetrafluoroeth Assessment/Plan: Principal Problem:   Acute on chronic renal failure (HCC) Active Problems:   Anemia of chronic disease   Hypomagnesemia   HSV (herpes simplex virus) anogenital infection   Chronic cough   Diarrhea   Vitamin D deficiency   Secondary hyperparathyroidism of renal origin ([place])   AIDS (acquired immune deficiency syndrome) ([place])   Uremia   FSGS (focal segmental glomerulosclerosis)   Malnutrition of moderate degree  40 yo non-compliant F with AIDS (CD4 of 40) with recent CAP in January who presents with 3-7 days of n/v/d found to have acute on chronic renal  failure and symptomatic anemia requiring emergent HD.   HIVAN now on dialysis: Pt currently has temp IJ catheter in place with plans for perm catheter on Tuesday, and HD today. CLIP pending for outpatient HD.  -nephrology and VVS following and appreciate recs -CLIP pending -AVG placement on Tuesday -renal diet with fluid restriction -daily renal function panel -continue aranesp, hectorol, MV  AIDS:  She says the purple pill is actually of blue color- will look into it. Maybe she has major depression going on- question if she will benefit from SSRI or psychiatry consult (or ECT if SSRI not helpful) . She has continued to refuse most of her oral pills yesterday.   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  -Renal Diet -Boost Breeze TID -Nutrition following   DVT/PE ppx: Heparin SQ TID  Dispo: Disposition is deferred at this time, awaiting improvement of current medical problems.  Anticipated discharge in approximately 3-4 day(s).   The patient does have a current PCP Campbell Riches, MD) and does not need an [facility] hospital follow-up appointment after discharge.  The patient does have transportation limitations that hinder transportation to clinic appointments.    LOS: 5 days   Signed Burgess Estelle MD 03/14/2015

## 2015-03-15 NOTE — Procedures (Signed)
Tolerating HD without hemodynamic issues.  Appears comfortable and feeling better with dialysis. Milind Raether C

## 2015-03-15 NOTE — Progress Notes (Signed)
Subjective: Paula Becker states that she is doing fine after hemodialysis today, and states that her heart was "acting up" during hemodialysis today. She denies any nausea, vomiting, diarrhea,constipation, fever or night sweats. She did endorse a dry cough without sputum production and an improvement in her appetite.  Objective: Vital signs in last 24 hours: Filed Vitals:   03/15/15 1000 03/15/15 1030 03/15/15 1100 03/15/15 1448  BP: 121/90 121/84 122/85 126/83  Pulse: 108 100 95 89  Temp:    98.1 F (36.7 C)  TempSrc:    Oral  Resp: 24 22 17 18   Height:      Weight:      SpO2:    100%   Weight change: 0.2 kg (7.1 oz)  Intake/Output Summary (Last 24 hours) at 03/15/15 1605 Last data filed at 03/15/15 0830  Gross per 24 hour  Intake    240 ml  Output      0 ml  Net    240 ml   Physical Exam Physical Exam  Constitutional: She is oriented to person, place, and time. No distress.  Eyes: EOM are normal.  Cardiovascular: Normal rate, regular rhythm and normal heart sounds.   Pulmonary/Chest: Effort normal.  Rhonchi heard in L upper lobe lung fields   Abdominal: Soft. Bowel sounds are normal.  Musculoskeletal: She exhibits no edema.  Neurological: She is alert and oriented to person, place, and time.  Skin: Skin is warm and dry.   Lab Results: CBC    Component Value Date/Time   WBC 4.4 03/12/2015 0644   RBC 3.52* 03/12/2015 0644   RBC 3.01* 02/07/2015 0900   HGB 9.7* 03/12/2015 0644   HCT 28.2* 03/12/2015 0644   PLT 232 03/12/2015 0644   MCV 80.1 03/12/2015 0644   MCH 27.6 03/12/2015 0644   MCHC 34.4 03/12/2015 0644   RDW 18.7* 03/12/2015 0644   LYMPHSABS 1.0 02/07/2015 0443   MONOABS 1.2* 02/07/2015 0443   EOSABS 0.0 02/07/2015 0443   BASOSABS 0.0 02/07/2015 0443    BMET    Component Value Date/Time   NA 139 03/15/2015 0750   K 3.8 03/15/2015 0750   CL 105 03/15/2015 0750   CO2 23 03/15/2015 0750   GLUCOSE 83 03/15/2015 0750   GLUCOSE 118 04/01/2010   BUN 23* 03/15/2015 0750   CREATININE 5.68* 03/15/2015 0750   CREATININE 1.66* 09/30/2014 1416   CALCIUM 7.1* 03/15/2015 0750   GFRNONAA 9* 03/15/2015 0750   GFRNONAA 46* 10/08/2012 1058   GFRAA 10* 03/15/2015 0750   GFRAA 53* 10/08/2012 1058     Micro Results: Recent Results (from the past 240 hour(s))  Blood culture (routine x 2)     Status: None   Collection Time: 03/10/15  4:55 PM  Result Value Ref Range Status   Specimen Description BLOOD RIGHT ANTECUBITAL  Final   Special Requests IN PEDIATRIC BOTTLE 4CC  Final   Culture NO GROWTH 5 DAYS  Final   Report Status 03/15/2015 FINAL  Final  Blood culture (routine x 2)     Status: None   Collection Time: 03/10/15  6:04 PM  Result Value Ref Range Status   Specimen Description BLOOD RIGHT HAND  Final   Special Requests IN PEDIATRIC BOTTLE 4CC  Final   Culture NO GROWTH 5 DAYS  Final   Report Status 03/15/2015 FINAL  Final   Studies/Results: No results found.  Scheduled Meds: . sodium chloride   Intravenous Once  . sodium chloride   Intravenous Once  .  azithromycin  1,200 mg Oral Weekly  . [START ON 03/16/2015]  ceFAZolin (ANCEF) IV  1 g Intravenous To OR  . darbepoetin (ARANESP) injection - DIALYSIS  100 mcg Intravenous Q Thu-HD  . darunavir  800 mg Oral Q breakfast  . dolutegravir  50 mg Oral BID  . doxercalciferol  2 mcg Intravenous Q T,Th,Sa-HD  . feeding supplement  1 Container Oral TID BM  . fluconazole  100 mg Oral Daily  . heparin subcutaneous  5,000 Units Subcutaneous 3 times per day  . multivitamin  1 tablet Oral QHS  . ritonavir  100 mg Oral Q breakfast  . sodium chloride flush  3 mL Intravenous Q12H  . sulfamethoxazole-trimethoprim  1 tablet Oral Once per day on Mon Wed Fri  . tenofovir  300 mg Oral Weekly  . valACYclovir  500 mg Oral Daily  . zidovudine  300 mg Oral Q breakfast   Continuous Infusions:  PRN Meds:.sodium chloride, sodium chloride, acetaminophen **OR** acetaminophen, alteplase, heparin,  lidocaine (PF), lidocaine-prilocaine, ondansetron **OR** ondansetron (ZOFRAN) IV, pentafluoroprop-tetrafluoroeth Assessment/Plan: Principal Problem:   Acute on chronic renal failure (HCC) Active Problems:   Anemia of chronic disease   Hypomagnesemia   HSV (herpes simplex virus) anogenital infection   Chronic cough   Diarrhea   Vitamin D deficiency   Secondary hyperparathyroidism of renal origin ([place])   AIDS (acquired immune deficiency syndrome) ([place])   Uremia   FSGS (focal segmental glomerulosclerosis)   Malnutrition of moderate degree   Paula Becker is a 40 yo female with a history of medical non-adherence to HAART therapy , AIDS w/ (CD4 of 17) with recent CAP in January who presents with 3-7 days of n/v/d found to have acute on chronic renal failure and symptomatic anemia requiring emergent HD. She recently received pRBC transfusions to address her anemia likely due to her chronic disease/ESRD. The patient has been informed about what an AV graft is for per surgery who came to talk to her today, and she had hemodialysis as well. She is currently awaiting AV graft placement tomorrow.    HIVAN now on dialysis: Pt currently has temp IJ catheter in place with plans for a tunnel catheter on Tuesday, and HD on Monday. CLIP pending for outpatient HD. Patient made aware of the what AV graft consist of.  -Nephrology and VVS following and appreciate recs -AVG placement tomorrow -renal diet with fluid restriction -daily renal function panel -continue aranesp, hectorol, MV  AIDS: Last CD4 count 40 in January 2017 with HIV RNA level of 20,700. Patient is currently on salvage therapy with Tivicay 50 mg BID, Darunavir 800 mg daily, ritonavir 100 mg daily, Zidovudine 300 mg daily and Tenofovir 300 mg weekly; however, patient has been refusing her antiviral medications, despite nursing crushing her medications to make it easier for her to take.   -ID consulted, and stated that when she is going  home that she may need to consider blister pack or education with pharmacy with calendar and pill box since there is a concern about her literacy and comprehension of the meication schedule. -Bactrim 3 times weekly, renally dosed -Azithromycin Qweek  -Fluconazole 100 mg daily -Tivicay 50 mg BID -Darunavir 800 mg daily -Ritonavir 100 mg daily -Zidovudine 300 mg daily -Tenofovir 300 mg weekly  HSV: Patient has recurrent HSV skin lesions notably in her gluteal cleft. She recently mentioned it was the "blue pill" that she does not like to take which is likely Valtrex and ID recommends completing the course. -  Valtrex 500 mg daily (give after HD on HD days) x 7 days  Normocytic Anemia: Likely secondary to CKD. Hgb on admission 5.4 which improved to 9.7 yesterday s/p pRBC transfusions. -Continue to monitor intermittent CBCs. MCV today 80.1 up from MCV 77.4 about four days ago, now in normocytic range.  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 Patient does not want eat the hospital food but has no LOA and is willing to eat outside food.I'm concerned about worsening malnutrition, and think we could consider allowing her to eat more freely and add-on phosph binder given that she will likely eat whatever she wants on discharge. -Renal Diet -Boost Breeze TID -Nutrition following  Vitamin D Deficiency: 25-OH Vitamin D level <4.0 on admission.  -Doxercalciferol with HD to prevent secondary hyperPTH  DVT/PE ppx: Heparin SQ TID  Dispo: Disposition is deferred at this time, awaiting improvement of current medical problems. Anticipated discharge in approximately 3-4 day(s).   The patient does have a current PCP Campbell Riches, MD) and does not need an [facility] hospital follow-up appointment after discharge.  The patient does have transportation limitations that hinder transportation to clinic appointments.  This is a Careers information officer Note.  The care of the patient was  discussed and the assessment and plan formulated with their assistance.  Please see their attached note for official documentation of the daily encounter.   LOS: 5 days   Delena Serve., Med Student 03/15/2015, 4:05 PM

## 2015-03-15 NOTE — Progress Notes (Signed)
Pt refuse all her med for the shift, pt said she will only takes meds through iv, will continue to monitor

## 2015-03-15 NOTE — Progress Notes (Signed)
VASCULAR SURGERY:  The patient is scheduled for placement of a left arm AV graft and a tunneled dialysis catheter tomorrow. I discussed the procedure and risks with the patient yesterday and she is agreeable to proceed.  Deitra Mayo, MD, [place] [phone] Office: [phone]

## 2015-03-15 NOTE — Progress Notes (Signed)
Internal Medicine Attending  Date: 03/15/2015  Patient name: Paula Becker Medical record number: [id number] Date of birth: [birth year] Age: 40 y.o. Gender: female  I saw and evaluated the patient. I reviewed the resident's note by Dr. Tiburcio Pea and I agree with the resident's findings and plans as documented in his progress note.  I saw [name] this afternoon after she completed another round of dialysis. She was much more interactive than I've ever seen her before. She was engaged in our conversation and seemed concerned about her health, particularly the renal diet that she has been counseled on. She denies any nausea or vomiting and states that her appetite has returned. I suspect that much of her symptomatology was related to her uremia which has since resolved with dialysis. We will try to make the most out of this time and engagement as we prepare her for long-term outpatient dialysis.

## 2015-03-15 NOTE — Progress Notes (Addendum)
Report received from [place], [place] who stated that patient refused all PO medications today and stated she will document refusal of medications in [facility].  4:25 PM Patient stated "i do not know what is going on". RN read MD note to patient with medications she is prescribed here and patient stated "I do not normally take these and I do not want to take them without the doctor telling me why I am taking them". RN attempted to explain but patient still wanted MD to explain before taking meds. Dr. Baxter Flattery paged.   4:37 PM Dr. Baxter Flattery at bedside and explained medications to patient. Patient agreeable to take medications and then when MD left patient stated she did not want medications on an empty stomach. RN offered food/drink/snacks and patient refused and kept stating "i will not take medications on an empty stomach and I do not want those". Will attempt again after dinner.    1900: Patient ate dinner and still refusing mediations

## 2015-03-16 ENCOUNTER — Inpatient Hospital Stay (HOSPITAL_COMMUNITY): Payer: Medicare Other

## 2015-03-16 ENCOUNTER — Inpatient Hospital Stay (HOSPITAL_COMMUNITY): Payer: Medicare Other | Admitting: Certified Registered Nurse Anesthetist

## 2015-03-16 ENCOUNTER — Other Ambulatory Visit: Payer: Self-pay | Admitting: *Deleted

## 2015-03-16 ENCOUNTER — Encounter (HOSPITAL_COMMUNITY)
Admission: EM | Disposition: A | Discharge status: Home / Self Care | Payer: Self-pay | Source: Home / Self Care | Attending: Internal Medicine

## 2015-03-16 ENCOUNTER — Encounter (HOSPITAL_COMMUNITY): Payer: Self-pay | Admitting: Certified Registered Nurse Anesthetist

## 2015-03-16 DIAGNOSIS — N186 End stage renal disease: Secondary | ICD-10-CM

## 2015-03-16 DIAGNOSIS — F0631 Mood disorder due to known physiological condition with depressive features: Secondary | ICD-10-CM | POA: Diagnosis present

## 2015-03-16 DIAGNOSIS — Z4931 Encounter for adequacy testing for hemodialysis: Secondary | ICD-10-CM

## 2015-03-16 DIAGNOSIS — Z992 Dependence on renal dialysis: Secondary | ICD-10-CM

## 2015-03-16 DIAGNOSIS — F329 Major depressive disorder, single episode, unspecified: Secondary | ICD-10-CM

## 2015-03-16 HISTORY — PX: INSERTION OF DIALYSIS CATHETER: SHX1324

## 2015-03-16 HISTORY — PX: BASCILIC VEIN TRANSPOSITION: SHX5742

## 2015-03-16 LAB — SURGICAL PCR SCREEN
MRSA, PCR: NEGATIVE
STAPHYLOCOCCUS AUREUS: NEGATIVE

## 2015-03-16 LAB — GLUCOSE 6 PHOSPHATE DEHYDROGENASE
G6PDH: 10.1 U/g{Hb} (ref 4.6–13.5)
HEMOGLOBIN: 9.4 g/dL — AB (ref 11.1–15.9)

## 2015-03-16 SURGERY — INSERTION OF DIALYSIS CATHETER
Anesthesia: Monitor Anesthesia Care | Site: Neck

## 2015-03-16 MED ORDER — FENTANYL CITRATE (PF) 100 MCG/2ML IJ SOLN
INTRAMUSCULAR | Status: DC | PRN
  Administered 2015-03-16: 50 ug via INTRAVENOUS

## 2015-03-16 MED ORDER — SODIUM CHLORIDE 0.9 % IV SOLN
INTRAVENOUS | Status: DC | PRN
  Administered 2015-03-16: 10:00:00

## 2015-03-16 MED ORDER — 0.9 % SODIUM CHLORIDE (POUR BTL) OPTIME
TOPICAL | Status: DC | PRN
  Administered 2015-03-16: 1000 mL

## 2015-03-16 MED ORDER — ONDANSETRON HCL 4 MG/2ML IJ SOLN: 4.0000 mg | Freq: Once | INTRAMUSCULAR | Status: DC | PRN

## 2015-03-16 MED ORDER — OXYCODONE HCL 5 MG PO TABS
5.0000 mg | ORAL_TABLET | Freq: Four times a day (QID) | ORAL | Status: DC | PRN
  Administered 2015-03-18 (×2): 5 mg via ORAL
  Filled 2015-03-16 (×2): qty 1

## 2015-03-16 MED ORDER — PHENYLEPHRINE HCL 10 MG/ML IJ SOLN
INTRAMUSCULAR | Status: DC | PRN
  Administered 2015-03-16: 40 ug via INTRAVENOUS
  Administered 2015-03-16: 80 ug via INTRAVENOUS
  Administered 2015-03-16: 40 ug via INTRAVENOUS

## 2015-03-16 MED ORDER — HEPARIN SODIUM (PORCINE) 5000 UNIT/ML IJ SOLN
5000.0000 [IU] | Freq: Three times a day (TID) | INTRAMUSCULAR | Status: DC
  Filled 2015-03-16: qty 1

## 2015-03-16 MED ORDER — SODIUM CHLORIDE 0.9 % IV SOLN
INTRAVENOUS | Status: DC
  Administered 2015-03-16: 09:00:00 via INTRAVENOUS

## 2015-03-16 MED ORDER — FENTANYL CITRATE (PF) 250 MCG/5ML IJ SOLN
INTRAMUSCULAR | Status: AC
  Filled 2015-03-16: qty 5

## 2015-03-16 MED ORDER — PAPAVERINE HCL 30 MG/ML IJ SOLN
INTRAMUSCULAR | Status: AC
  Filled 2015-03-16: qty 2

## 2015-03-16 MED ORDER — PROTAMINE SULFATE 10 MG/ML IV SOLN
INTRAVENOUS | Status: DC | PRN
  Administered 2015-03-16 (×2): 10 mg via INTRAVENOUS

## 2015-03-16 MED ORDER — HEPARIN SODIUM (PORCINE) 1000 UNIT/ML IJ SOLN
INTRAMUSCULAR | Status: AC
  Filled 2015-03-16: qty 1

## 2015-03-16 MED ORDER — MIDAZOLAM HCL 5 MG/5ML IJ SOLN
INTRAMUSCULAR | Status: DC | PRN
  Administered 2015-03-16 (×2): 1 mg via INTRAVENOUS

## 2015-03-16 MED ORDER — ONDANSETRON HCL 4 MG/2ML IJ SOLN
INTRAMUSCULAR | Status: AC
  Filled 2015-03-16: qty 2

## 2015-03-16 MED ORDER — SODIUM CHLORIDE 0.9 % IV SOLN
INTRAVENOUS | Status: DC | PRN
  Administered 2015-03-16 (×2): via INTRAVENOUS

## 2015-03-16 MED ORDER — PROPOFOL 500 MG/50ML IV EMUL
INTRAVENOUS | Status: DC | PRN
Start: 2015-03-16 — End: 2015-03-16
  Administered 2015-03-16: 11:00:00 via INTRAVENOUS
  Administered 2015-03-16: 75 ug/kg/min via INTRAVENOUS

## 2015-03-16 MED ORDER — ONDANSETRON HCL 4 MG/2ML IJ SOLN
INTRAMUSCULAR | Status: DC | PRN
  Administered 2015-03-16: 4 mg via INTRAVENOUS

## 2015-03-16 MED ORDER — MIDAZOLAM HCL 2 MG/2ML IJ SOLN
INTRAMUSCULAR | Status: AC
  Filled 2015-03-16: qty 2

## 2015-03-16 MED ORDER — LIDOCAINE-EPINEPHRINE (PF) 1 %-1:200000 IJ SOLN
INTRAMUSCULAR | Status: AC
  Filled 2015-03-16: qty 30

## 2015-03-16 MED ORDER — LIDOCAINE HCL (PF) 1 % IJ SOLN
INTRAMUSCULAR | Status: AC
  Filled 2015-03-16: qty 30

## 2015-03-16 MED ORDER — HYDROMORPHONE HCL 1 MG/ML IJ SOLN: 0.2500 mg | INTRAMUSCULAR | Status: DC | PRN

## 2015-03-16 MED ORDER — LIDOCAINE-EPINEPHRINE (PF) 1 %-1:200000 IJ SOLN
INTRAMUSCULAR | Status: DC | PRN
  Administered 2015-03-16: 10 mL
  Administered 2015-03-16: 30 mL

## 2015-03-16 MED ORDER — PROTAMINE SULFATE 10 MG/ML IV SOLN
INTRAVENOUS | Status: AC
  Filled 2015-03-16: qty 5

## 2015-03-16 MED ORDER — MEPERIDINE HCL 25 MG/ML IJ SOLN: 6.2500 mg | INTRAMUSCULAR | Status: DC | PRN

## 2015-03-16 MED ORDER — HEPARIN SODIUM (PORCINE) 1000 UNIT/ML IJ SOLN
INTRAMUSCULAR | Status: DC | PRN
Start: 2015-03-16 — End: 2015-03-16
  Administered 2015-03-16: 5000 [IU] via INTRAVENOUS

## 2015-03-16 MED ORDER — PHENYLEPHRINE 40 MCG/ML (10ML) SYRINGE FOR IV PUSH (FOR BLOOD PRESSURE SUPPORT)
PREFILLED_SYRINGE | INTRAVENOUS | Status: AC
  Filled 2015-03-16: qty 10

## 2015-03-16 MED ORDER — HEPARIN SODIUM (PORCINE) 1000 UNIT/ML IJ SOLN
INTRAMUSCULAR | Status: DC | PRN
  Administered 2015-03-16: 4.6 mL

## 2015-03-16 MED ORDER — PAPAVERINE HCL 30 MG/ML IJ SOLN
INTRAMUSCULAR | Status: DC | PRN
  Administered 2015-03-16: 60 mg via INTRAVENOUS

## 2015-03-16 SURGICAL SUPPLY — 63 items
ARMBAND PINK RESTRICT EXTREMIT (MISCELLANEOUS) ×5 IMPLANT
BAG DECANTER FOR FLEXI CONT (MISCELLANEOUS) IMPLANT
BIOPATCH RED 1 DISK 7.0 (GAUZE/BANDAGES/DRESSINGS) ×4 IMPLANT
BIOPATCH RED 1IN DISK 7.0MM (GAUZE/BANDAGES/DRESSINGS) ×1
CANISTER SUCTION 2500CC (MISCELLANEOUS) ×5 IMPLANT
CANNULA VESSEL 3MM 2 BLNT TIP (CANNULA) ×5 IMPLANT
CATH PALINDROME RT-P 15FX19CM (CATHETERS) IMPLANT
CATH PALINDROME RT-P 15FX23CM (CATHETERS) ×5 IMPLANT
CATH PALINDROME RT-P 15FX28CM (CATHETERS) IMPLANT
CATH PALINDROME RT-P 15FX55CM (CATHETERS) IMPLANT
CHLORAPREP W/TINT 26ML (MISCELLANEOUS) ×5 IMPLANT
CLIP TI MEDIUM 6 (CLIP) ×5 IMPLANT
CLIP TI WIDE RED SMALL 6 (CLIP) ×15 IMPLANT
COVER PROBE W GEL 5X96 (DRAPES) ×5 IMPLANT
DECANTER SPIKE VIAL GLASS SM (MISCELLANEOUS) IMPLANT
DRAPE C-ARM 42X72 X-RAY (DRAPES) ×5 IMPLANT
DRAPE CHEST BREAST 15X10 FENES (DRAPES) ×5 IMPLANT
ELECT REM PT RETURN 9FT ADLT (ELECTROSURGICAL) ×5
ELECTRODE REM PT RTRN 9FT ADLT (ELECTROSURGICAL) ×3 IMPLANT
GAUZE SPONGE 2X2 8PLY STRL LF (GAUZE/BANDAGES/DRESSINGS) ×3 IMPLANT
GAUZE SPONGE 4X4 16PLY XRAY LF (GAUZE/BANDAGES/DRESSINGS) ×5 IMPLANT
GLOVE BIO SURGEON STRL SZ 6 (GLOVE) ×5 IMPLANT
GLOVE BIO SURGEON STRL SZ 6.5 (GLOVE) ×4 IMPLANT
GLOVE BIO SURGEON STRL SZ7.5 (GLOVE) ×15 IMPLANT
GLOVE BIO SURGEONS STRL SZ 6.5 (GLOVE) ×1
GLOVE BIOGEL PI IND STRL 6 (GLOVE) ×3 IMPLANT
GLOVE BIOGEL PI IND STRL 6.5 (GLOVE) ×6 IMPLANT
GLOVE BIOGEL PI IND STRL 7.0 (GLOVE) ×6 IMPLANT
GLOVE BIOGEL PI IND STRL 8 (GLOVE) ×9 IMPLANT
GLOVE BIOGEL PI INDICATOR 6 (GLOVE) ×2
GLOVE BIOGEL PI INDICATOR 6.5 (GLOVE) ×4
GLOVE BIOGEL PI INDICATOR 7.0 (GLOVE) ×4
GLOVE BIOGEL PI INDICATOR 8 (GLOVE) ×6
GLOVE ECLIPSE 6.5 STRL STRAW (GLOVE) ×10 IMPLANT
GOWN STRL REUS W/ TWL LRG LVL3 (GOWN DISPOSABLE) ×12 IMPLANT
GOWN STRL REUS W/TWL LRG LVL3 (GOWN DISPOSABLE) ×8
GOWN STRL REUS W/TWL XL LVL3 (GOWN DISPOSABLE) ×5 IMPLANT
KIT BASIN OR (CUSTOM PROCEDURE TRAY) ×5 IMPLANT
KIT ROOM TURNOVER OR (KITS) ×5 IMPLANT
LIQUID BAND (GAUZE/BANDAGES/DRESSINGS) ×20 IMPLANT
NEEDLE 18GX1X1/2 (RX/OR ONLY) (NEEDLE) ×10 IMPLANT
NEEDLE 22X1 1/2 (OR ONLY) (NEEDLE) ×5 IMPLANT
NEEDLE HYPO 25GX1X1/2 BEV (NEEDLE) IMPLANT
NS IRRIG 1000ML POUR BTL (IV SOLUTION) ×5 IMPLANT
PACK CV ACCESS (CUSTOM PROCEDURE TRAY) ×5 IMPLANT
PACK SURGICAL SETUP 50X90 (CUSTOM PROCEDURE TRAY) IMPLANT
PAD ARMBOARD 7.5X6 YLW CONV (MISCELLANEOUS) ×10 IMPLANT
SPONGE GAUZE 2X2 STER 10/PKG (GAUZE/BANDAGES/DRESSINGS) ×2
SPONGE SURGIFOAM ABS GEL 100 (HEMOSTASIS) IMPLANT
SUT ETHILON 3 0 PS 1 (SUTURE) ×5 IMPLANT
SUT PROLENE 6 0 BV (SUTURE) ×5 IMPLANT
SUT SILK 2 0 FS (SUTURE) ×5 IMPLANT
SUT VIC AB 3-0 SH 27 (SUTURE) ×2
SUT VIC AB 3-0 SH 27X BRD (SUTURE) ×3 IMPLANT
SUT VICRYL 4-0 PS2 18IN ABS (SUTURE) ×15 IMPLANT
SYR 20CC LL (SYRINGE) ×5 IMPLANT
SYR 3ML LL SCALE MARK (SYRINGE) ×5 IMPLANT
SYR 5ML LL (SYRINGE) ×10 IMPLANT
SYR CONTROL 10ML LL (SYRINGE) IMPLANT
SYRINGE 10CC LL (SYRINGE) IMPLANT
TAPE CLOTH SURG 4X10 WHT LF (GAUZE/BANDAGES/DRESSINGS) ×5 IMPLANT
UNDERPAD 30X30 INCONTINENT (UNDERPADS AND DIAPERS) ×5 IMPLANT
WATER STERILE IRR 1000ML POUR (IV SOLUTION) IMPLANT

## 2015-03-16 NOTE — Anesthesia Postprocedure Evaluation (Signed)
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  Procedure(s) Performed: Procedure(s) (LRB): INSERTION OF DIALYSIS CATHETER RIGHT INTERNAL JUGULAR (N/A) BASCILIC VEIN TRANSPOSITION LEFT UPPER ARM (Left)  Patient location during evaluation: PACU Anesthesia Type: MAC Level of consciousness: awake and alert Pain management: pain level controlled Vital Signs Assessment: post-procedure vital signs reviewed and stable Respiratory status: spontaneous breathing, nonlabored ventilation, respiratory function stable and patient connected to nasal cannula oxygen Cardiovascular status: stable and blood pressure returned to baseline Anesthetic complications: no    Last Vitals:  Filed Vitals:   03/16/15 1300 03/16/15 1315  BP:    Pulse: 94 96  Temp:  36.2 C  Resp: 10 12    Last Pain:  Filed Vitals:   03/16/15 1639  PainSc: 0-No pain                 Livana Yerian DAVID

## 2015-03-16 NOTE — Progress Notes (Signed)
Subjective:  I saw Paula Becker after being back from the AV graft surgery at 3 PM.Pt was very drowsy so I did not broach the topic of medication compliance for her, but this will be necessary for tomorrow. I did tell her that the blue pill was the valtrex and that can be stopped.  i did get an opportunity to speak in person with Dr. Lenna Sciara from Psychiatry who happened to be on the same unit when I went to see the patient. I explained the history and the rationale for the consult as we are concerned she may have underlying major depression, and other psychiatric issue going on.  I also told him that she has a long history of med non-adherence and so she may not take the SSRI, and so what other alternatives there may be to address her depression, if it is there. We are also concerned that she many not go to outpatient dialysis.    Objective: Vital signs in last 24 hours: Filed Vitals:   03/16/15 1230 03/16/15 1245 03/16/15 1300 03/16/15 1315  BP:  123/90    Pulse: 108 98 94 96  Temp:    97.1 F (36.2 C)  TempSrc:      Resp: 27 21 10 12   Height:      Weight:      SpO2: 88% 97% 98% 96%   Weight change: -3.5 oz (-0.1 kg)  Intake/Output Summary (Last 24 hours) at 03/16/15 1529 Last data filed at 03/16/15 1210  Gross per 24 hour  Intake    500 ml  Output     50 ml  Net    450 ml    General: Vital signs reviewed.  Pt drowsy CV: rrr  Left arm: has new avg with good thrill Resp: ctab, Abd: soft, ntnd, +BS Extremities: no extremity edema    Lab Results: Basic Metabolic Panel:  Recent Labs Lab 03/11/15 0540  03/14/15 0500 03/15/15 0750  NA 136  < > 137 139  K 3.2*  < > 4.4 3.8  CL 101  < > 105 105  CO2 23  < > 22 23  GLUCOSE 85  < > 99 83  BUN 34*  < > 16 23*  CREATININE 6.51*  < > 4.24* 5.68*  CALCIUM 6.5*  < > 6.7* 7.1*  MG 1.5*  --   --   --   PHOS 5.6*  < > 4.6 5.8*  < > = values in this interval not displayed. Liver Function Tests:  Recent Labs Lab 03/10/15 1432   03/12/15 0644  03/14/15 0500 03/15/15 0750  AST 14*  --  17  --   --   --   ALT 8*  --  8*  --   --   --   ALKPHOS 112  --  113  --   --   --   BILITOT 0.4  --  0.3  --   --   --   PROT 6.6  --  6.3*  --   --   --   ALBUMIN <1.0*  < > <1.0*  < > <1.0* <1.0*  < > = values in this interval not displayed.  Recent Labs Lab 03/10/15 1432  LIPASE 67*   CBC:  Recent Labs Lab 03/11/15 0540 03/12/15 0644  WBC 5.4 4.4  HGB 9.3* 9.7*  HCT 26.0* 28.2*  MCV 77.4* 80.1  PLT 227 232   Coagulation:  Recent Labs Lab 03/11/15 0540  LABPROT  17.0*  INR 1.37   Anemia Panel:  Recent Labs Lab 03/11/15 0540  FERRITIN 724*  TIBC NOT CALCULATED  IRON 38   Micro Results: Recent Results (from the past 240 hour(s))  Blood culture (routine x 2)     Status: None   Collection Time: 03/10/15  4:55 PM  Result Value Ref Range Status   Specimen Description BLOOD RIGHT ANTECUBITAL  Final   Special Requests IN PEDIATRIC BOTTLE 4CC  Final   Culture NO GROWTH 5 DAYS  Final   Report Status 03/15/2015 FINAL  Final  Blood culture (routine x 2)     Status: None   Collection Time: 03/10/15  6:04 PM  Result Value Ref Range Status   Specimen Description BLOOD RIGHT HAND  Final   Special Requests IN PEDIATRIC BOTTLE 4CC  Final   Culture NO GROWTH 5 DAYS  Final   Report Status 03/15/2015 FINAL  Final  Surgical pcr screen     Status: None   Collection Time: 03/16/15  4:51 AM  Result Value Ref Range Status   MRSA, PCR NEGATIVE NEGATIVE Final   Staphylococcus aureus NEGATIVE NEGATIVE Final    Comment:        The Xpert SA Assay (FDA approved for NASAL specimens in patients over 1 years of age), is one component of a comprehensive surveillance program.  Test performance has been validated by [facility] for patients greater than or equal to 24 year old. It is not intended to diagnose infection nor to guide or monitor treatment.    Medications:  I have reviewed the patient's current  medications. Prior to Admission:  Prescriptions prior to admission  Medication Sig Dispense Refill Last Dose  . alum & mag hydroxide-simeth (MAALOX/MYLANTA) 200-200-20 MG/5ML suspension Take 30 mLs by mouth every 6 (six) hours as needed for indigestion, heartburn or flatulence. (Patient not taking: Reported on 03/10/2015) 355 mL 0 Not Taking at Unknown time  . azithromycin (ZITHROMAX) 600 MG tablet Take 2 tablets (1200 mg) by mouth every Tuesday for 11 more weeks 24 tablet 0 1-2 weeks  . fluconazole (DIFLUCAN) 100 MG tablet Take 100 mg by mouth daily.  0 1-2 weeks  . gabapentin (NEURONTIN) 300 MG capsule TAKE 1 CAPSULE BY MOUTH THREE TIMES DAILY (Patient not taking: Reported on 01/18/2015) 60 capsule 2 Not Taking at Unknown time  . hydrOXYzine (ATARAX/VISTARIL) 25 MG tablet Take 1 tablet (25 mg total) by mouth every 6 (six) hours. (Patient not taking: Reported on 01/18/2015) 20 tablet 0 Not Taking at Unknown time  . sulfamethoxazole-trimethoprim (BACTRIM,SEPTRA) 400-80 MG tablet Take 1 tablet by mouth 3 (three) times a week. For 3 weeks  0 1-2 weeks  . valACYclovir (VALTREX) 500 MG tablet Take 1 tablet (500 mg total) by mouth daily. 21 tablet 0 1-2 weeks   Scheduled Meds: . sodium chloride   Intravenous Once  . sodium chloride   Intravenous Once  . azithromycin  1,200 mg Oral Weekly  . darbepoetin (ARANESP) injection - DIALYSIS  100 mcg Intravenous Q Thu-HD  . darunavir  800 mg Oral Q breakfast  . dolutegravir  50 mg Oral BID  . doxercalciferol  2 mcg Intravenous Q T,Th,Sa-HD  . feeding supplement  1 Container Oral TID BM  . fluconazole  100 mg Oral Daily  . heparin subcutaneous  5,000 Units Subcutaneous 3 times per day  . multivitamin  1 tablet Oral QHS  . ritonavir  100 mg Oral Q breakfast  . sodium chloride flush  3 mL Intravenous Q12H  . sulfamethoxazole-trimethoprim  1 tablet Oral Once per day on Mon Wed Fri  . tenofovir  300 mg Oral Weekly  . valACYclovir  500 mg Oral Daily  .  zidovudine  300 mg Oral Q breakfast   Continuous Infusions: . sodium chloride 10 mL/hr at 03/16/15 0903   PRN Meds:.sodium chloride, sodium chloride, acetaminophen **OR** acetaminophen, alteplase, heparin, lidocaine (PF), lidocaine-prilocaine, ondansetron **OR** ondansetron (ZOFRAN) IV, oxyCODONE, pentafluoroprop-tetrafluoroeth Assessment/Plan: Principal Problem:   Acute on chronic renal failure (HCC) Active Problems:   Anemia of chronic disease   Hypomagnesemia   HSV (herpes simplex virus) anogenital infection   Chronic cough   Diarrhea   Vitamin D deficiency   Secondary hyperparathyroidism of renal origin ([place])   AIDS (acquired immune deficiency syndrome) ([place])   Uremia   FSGS (focal segmental glomerulosclerosis)   Malnutrition of moderate degree   Dialysis patient [facility])  40 yo non-compliant F with AIDS (CD4 of 40) with recent CAP in January who presents with 3-7 days of n/v/d found to have acute on chronic renal failure and symptomatic anaemia requiring emergent HD.   HIVAN now on dialysis: Pt just underwent perm graft placement today. She has been clipped for outpatient dialysis.  -nephrology and VVS following and appreciate recs -continue aranesp, hectorol, MV   AIDS:  Explained that she does not need to take valtrex. As explained in subjective, we are concerned if pt has adequate decision-making capacity, and secondly whether she has underlying major depression that needs to be treated. I talked about other options with Dr. Lenna Sciara as the oral SSRI pill may not be suitable for her given her nonadherence with oral pills, and he said he will look into it - some of the other options may be ECT but it is only available in [place], not in [facility] or [facility]. If she does have decision making capacity, and if she continues to refuse her medications, then unfortunately, we will need to consult palliative care to discuss end of life care and what her goals are. This is a difficult situation. Pt has  continued to refuse most of her pills.  If the patient is amenable to taking some of her pills, we will need to decide which pills have the highest priority, and those would be the 5 medicines for HIV. Please talk with ID to see if the pill burden can be reduced.  I suspect that she does not like taking bactrim as it has a lot of side effects so consider d/c it as she has not been taking it anyway.   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  -regular diet (not renal) -Boost Breeze TID -Nutrition following   DVT/PE ppx: Heparin SQ TID  Dispo: Disposition is deferred at this time, awaiting improvement of current medical problems.  Anticipated discharge in approximately 3-4 day(s).   The patient does have a current PCP Campbell Riches, MD) and does not need an [facility] hospital follow-up appointment after discharge.  The patient does have transportation limitations that hinder transportation to clinic appointments.    LOS: 6 days   Signed Burgess Estelle MD  03/14/2015  

## 2015-03-16 NOTE — Anesthesia Preprocedure Evaluation (Addendum)
Anesthesia Evaluation  Patient identified by MRN, date of birth, ID band Patient awake    Reviewed: Allergy & Precautions, NPO status , Patient's Chart, lab work & pertinent test results  Airway Mallampati: I  TM Distance: >3 FB Neck ROM: Full    Dental  (+) Dental Advisory Given, Teeth Intact   Pulmonary    Pulmonary exam normal        Cardiovascular Normal cardiovascular exam     Neuro/Psych    GI/Hepatic   Endo/Other    Renal/GU CRFRenal disease     Musculoskeletal   Abdominal   Peds  Hematology  (+) HIV,   Anesthesia Other Findings   Reproductive/Obstetrics                          Anesthesia Physical Anesthesia Plan  ASA: II  Anesthesia Plan: MAC   Post-op Pain Management:    Induction: Intravenous  Airway Management Planned: Natural Airway and Simple Face Mask  Additional Equipment:   Intra-op Plan:   Post-operative Plan:   Informed Consent: I have reviewed the patients History and Physical, chart, labs and discussed the procedure including the risks, benefits and alternatives for the proposed anesthesia with the patient or authorized representative who has indicated his/her understanding and acceptance.   Dental advisory given  Plan Discussed with: CRNA and Surgeon  Anesthesia Plan Comments:       Anesthesia Quick Evaluation

## 2015-03-16 NOTE — Progress Notes (Signed)
Internal Medicine Attending  Date: 03/16/2015  Patient name: Paula Becker Medical record number: [id number] Date of birth: 1975/06/21 Age: 40 y.o. Gender: female  I saw and evaluated the patient. I reviewed the resident's note by Dr. Tiburcio Pea and I agree with the resident's findings and plans as documented in his progress note.  It appears Paula Becker has been accepted to an outpatient hemodialysis unit to begin on Thursday. She underwent graft placement today. We will discuss with her her goals of care tomorrow and see which medication she is interested in taking. If she has decision-making capacity, which I believe she does at this time, and she refuses to take medications it may be the appropriate time to discuss palliative/hospice care, especially if this aligns with her values. I anticipate she will be ready for discharge tomorrow.

## 2015-03-16 NOTE — Discharge Summary (Signed)
Name: Paula Becker MRN: [id number] DOB: 12-18-1975 40 y.o. PCP: Campbell Riches, MD  Date of Admission: 03/10/2015  3:58 PM Date of Discharge: 03/18/2015 Attending Physician: Oval Linsey, MD  Discharge Diagnosis: 1. Acute on Chronic Renal Failure 2/2 HIV/AIDS   Principal Problem:   Other depression due to general medical condition Active Problems:   Anemia of chronic disease   Hypomagnesemia   HSV (herpes simplex virus) anogenital infection   Chronic cough   Diarrhea   Acute on chronic renal failure (HCC)   Vitamin D deficiency   Secondary hyperparathyroidism of renal origin ([place])   AIDS (acquired immune deficiency syndrome) (HCC)   Uremia   FSGS (focal segmental glomerulosclerosis)   Malnutrition of moderate degree   Dialysis patient ([place])   CKD (chronic kidney disease) stage V requiring chronic dialysis ([place])  Discharge Medications:   Medication List    TAKE these medications        alum & mag hydroxide-simeth 200-200-20 MG/5ML suspension  Commonly known as:  MAALOX/MYLANTA  Take 30 mLs by mouth every 6 (six) hours as needed for indigestion, heartburn or flatulence.     azithromycin 600 MG tablet  Commonly known as:  ZITHROMAX  Take 2 tablets (1200 mg) by mouth every Tuesday for 11 more weeks     Darbepoetin Alfa 100 MCG/0.5ML Sosy injection  Commonly known as:  ARANESP  Inject 0.5 mLs (100 mcg total) into the vein every Thursday with hemodialysis.     Darunavir Ethanolate 800 MG tablet  Commonly known as:  PREZISTA  Take 1 tablet (800 mg total) by mouth daily with breakfast.     dolutegravir 50 MG tablet  Commonly known as:  TIVICAY  Take 1 tablet (50 mg total) by mouth 2 (two) times daily.     doxercalciferol 4 MCG/2ML injection  Commonly known as:  HECTOROL  Inject 1 mL (2 mcg total) into the vein Every Tuesday,Thursday,and Saturday with dialysis.     feeding supplement Liqd  Take 1 Container by mouth 3 (three) times daily between meals.     fluconazole 100 MG tablet  Commonly known as:  DIFLUCAN  Take 100 mg by mouth daily.     gabapentin 300 MG capsule  Commonly known as:  NEURONTIN  TAKE 1 CAPSULE BY MOUTH THREE TIMES DAILY     hydrOXYzine 25 MG tablet  Commonly known as:  ATARAX/VISTARIL  Take 1 tablet (25 mg total) by mouth every 6 (six) hours.     multivitamin Tabs tablet  Take 1 tablet by mouth at bedtime.     ritonavir 100 MG Tabs tablet  Commonly known as:  NORVIR  Take 1 tablet (100 mg total) by mouth daily with breakfast.     sulfamethoxazole-trimethoprim 400-80 MG tablet  Commonly known as:  BACTRIM,SEPTRA  Take 1 tablet by mouth 3 (three) times a week.     tenofovir 300 MG tablet  Commonly known as:  VIREAD  Take 1 tablet (300 mg total) by mouth once a week.     valACYclovir 500 MG tablet  Commonly known as:  VALTREX  Take 1 tablet (500 mg total) by mouth daily.     zidovudine 100 MG capsule  Commonly known as:  RETROVIR  Take 3 capsules (300 mg total) by mouth daily with breakfast.        Disposition and follow-up:   Ms.Paula Becker was discharged from [facility] in [place] condition.  At the hospital follow up visit please address:  1.  Medication adherence, dialysis adherence, depression  2.  Labs / imaging needed at time of follow-up: BMP  3.  Pending labs/ test needing follow-up: none  Follow-up Appointments: Follow-up Information    Follow up with Deitra Mayo, MD In 6 weeks.   Specialties:  Vascular Surgery, Cardiology   Why:  Office will call you to arrange your appt (sent)   Contact information:   [place] [address] [phone]       Follow up with Bobby Rumpf, MD. Schedule an appointment as soon as possible for a visit in 2 weeks.   Specialty:  Infectious Diseases   Why:  for medication management   Contact information:   [place] [address] [phone]       Discharge  Instructions: Discharge Instructions    Call MD for:  persistant nausea and vomiting    Complete by:  As directed      Call MD for:  severe uncontrolled pain    Complete by:  As directed      Call MD for:  temperature >100.4    Complete by:  As directed      Diet - low sodium heart healthy    Complete by:  As directed      Increase activity slowly    Complete by:  As directed            Consultations: Treatment Team:  Mauricia Area, MD Ambrose Finland, MD  Procedures Performed:  Ct Abdomen Pelvis Wo Contrast  03/10/2015  CLINICAL DATA:  Acute lower abdominal pain. EXAM: CT ABDOMEN AND PELVIS WITHOUT CONTRAST TECHNIQUE: Multidetector CT imaging of the abdomen and pelvis was performed following the standard protocol without IV contrast. COMPARISON:  None. FINDINGS: Bronchiectasis and scarring is noted in the right middle and left lower lobes. No significant osseous abnormality is noted. No gallstones are noted. Small right hepatic cyst is noted. The spleen and pancreas appear normal on these unenhanced images. Adrenal glands and kidneys appear normal. No hydronephrosis or renal obstruction is noted. The appendix appears normal. There is no evidence of bowel obstruction. Uterus appears normal. Left ovary appears normal. Right ovary is not well visualized. Moderate amount of free fluid is noted in the pelvis. Urinary bladder appears normal. No significant adenopathy is noted. IMPRESSION: Bronchiectasis and scarring is noted in both lung bases. Moderate amount of free fluid is noted in the pelvis. Uterus and left ovary appear normal. Right ovary is not well visualized. Pelvic ultrasound is recommended to evaluate for possible right ovarian cyst or other pathology. Electronically Signed   By: Marijo Conception, M.D.   On: 03/10/2015 19:02   Dg Chest Port 1 View  03/16/2015  CLINICAL DATA:  Dialysis catheter insertion EXAM: CHEST 1 VIEW ; intraoperative fluoroscopy COMPARISON:  March 10, 2015 FLUOROSCOPY TIME:  0 minutes 11 second; no submitted image. FINDINGS: Dual-lumen central catheter is present. The distal tip is just beyond the cavoatrial junction in the right atrium. There is no demonstrable pneumothorax. There is moderate interstitial edema in the bases with small pleural effusions bilaterally. Heart size and contour within normal limits. There is layering effusion on the right. No adenopathy evident. IMPRESSION: Central catheter present without pneumothorax. The distal tip is in the right atrium slightly beyond the cavoatrial junction. There are pleural effusions bilaterally, slightly larger on the right than on the left with bibasilar atelectasis. No change in cardiac silhouette. Electronically Signed   By:  Lowella Grip III M.D.   On: 03/16/2015 13:09   Dg Chest Port 1 View  03/10/2015  CLINICAL DATA:  Encounter for central line placement. EXAM: PORTABLE CHEST 1 VIEW COMPARISON:  Same day. FINDINGS: The heart size and mediastinal contours are within normal limits. No pneumothorax is noted. Interval placement of right internal jugular catheter with distal tip in expected position of the SVC. Mild bibasilar atelectasis is noted. Mild bilateral pleural effusions are noted. The visualized skeletal structures are unremarkable. IMPRESSION: Stable mild bibasilar atelectasis with associated pleural effusions. Interval placement of right internal jugular catheter line with distal tip in expected position of the SVC. No pneumothorax is noted. Electronically Signed   By: Marijo Conception, M.D.   On: 03/10/2015 19:49   Dg Chest Portable 1 View  03/10/2015  CLINICAL DATA:  Weakness and cough for 3-4 days. Difficulty walking. Nausea and vomiting. Diarrhea. Lower abdominal pain. HIV. EXAM: PORTABLE CHEST 1 VIEW COMPARISON:  02/06/2014 FINDINGS: Indistinct airspace opacities pressed along both hemidiaphragms along with some questionable opacity in the right upper lung at the level of the aortic  arch. The lungs appear otherwise clear. Cardiac and mediastinal margins appear normal. Reverse lordotic projection. The generalized interstitial opacity has improved compared to 02/07/15. IMPRESSION: 1. The generalized interstitial opacity shown on 02/07/2015 is improved, but there some persistent indistinct airspace opacities at both lung bases and in the right mid upper lung such that residual multilobar pneumonia is a concern. Electronically Signed   By: Van Clines M.D.   On: 03/10/2015 17:25   Dg Abd Portable 1v  03/10/2015  CLINICAL DATA:  Abdominal pain EXAM: PORTABLE ABDOMEN - 1 VIEW COMPARISON:  02/10/2015 FINDINGS: The bowel gas pattern is normal. No radio-opaque calculi or other significant radiographic abnormality are seen. IMPRESSION: Negative. Electronically Signed   By: Kathreen Devoid   On: 03/10/2015 17:24   Dg Fluoro Guide Cv Line-no Report  03/16/2015  CLINICAL DATA:  FLOURO GUIDE CV LINE Fluoroscopy was utilized by the requesting physician.  No radiographic interpretation.    2D Echo:   Cardiac Cath:   Admission HPI:  40 y/o woman with PMHx of poorly controlled HIV/AIDS (last CD4 counts 40-60), worsening chronic renal failure from FSGS, AOCD, HSV, chronic cough 2/2 PNA 2012, presents with abdominal pain, nausea, and diarrhea for the past 3-5 days. Symptoms have been progressively worsening during this time. Her diarrhea is nonbloody and 3 times daily frequency. Her abdominal pain is sharp in character and does not radiate. Her nausea is persistent and she is not eating or keeping down medications, with 1 episode of vomiting yesterday. She denies hematuria or vaginal discharge, and LMP was around October of last year. She also notes some intermittent chest pain and SOB and worsening of her chronic dry nonproductive cough. She denies fever but feels chills intermittently during this time.  Initial labs in the ED showing renal failure with marked metabolic acidosis. Abdominal CT  showing small free fluid and R ovarian cyst otherwise no signficant acute process. She was started on bicarb infusion, RIJ HD catheter was placed at ED and patient admitted for emergent HD.  Of note Ms. Cabell was admitted 02/06/15 for PNA and treated while inpatient. She was in worsening acute on chronic renal failure at that time and instructed to follow ASAP with [facility] outpatient and never did so. Per ID clinic notes she has been chronically noncompliant on HIV therapy now with multidrug resistant virus.  Hospital Course by problem list:  Principal Problem:   Other depression due to general medical condition Active Problems:   Anemia of chronic disease   Hypomagnesemia   HSV (herpes simplex virus) anogenital infection   Chronic cough   Diarrhea   Acute on chronic renal failure (HCC)   Vitamin D deficiency   Secondary hyperparathyroidism of renal origin ([place])   AIDS (acquired immune deficiency syndrome) (HCC)   Uremia   FSGS (focal segmental glomerulosclerosis)   Malnutrition of moderate degree   Dialysis patient ([place])   CKD (chronic kidney disease) stage V requiring chronic dialysis (HCC)   Diarrhea, N/V, and AMS: Likely secondary to progression of her kidney disease and uremia. Her symptoms improved greatly after she underwent several sessions of dialysis. She received 2 units PRBC and hgb has been stable.  HIV Nephropathy: Biopsy unproven. She underwent several sessions of dialysis and had AVG placed 2/14.  She has been set up with outpatient dialysis.  Her father and son will help with transportation to and from dialysis.  AIDS: Last CD4 count was 40 in Jan 2017 and patient has AIDS defining illness. Patient has been put on salvage therapy for HIV but has not taken oral meds. Also on Bactrim, Azithromycin, and Diflucan for prophylaxis.  Acute on chronic anaemia : HgB of 5.4 on admission. FOBT +. Patient denies any blood loss or melena. Probably this is secondary to worsening renal  function as ferritin is 724. Her HgB has been in the 7-8 range in last admission. She was transfused 2 units PRBC. Post transfusion CBC was 9.3 and hemoglobin has been stable.  Severe malnutrition: Had loss of appetite during prior month after being discharge. Etiology most likely due to buildup of Uremia due to progressively worsening renal function. HIV also can lead to wasting. Her appetite improved greatly after dialysis.  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 She was refusing valtrex and valtrex was stopped on 2/14.   Discharge Vitals:   BP 107/75 mmHg  Pulse 87  Temp(Src) 97.7 F (36.5 C) (Oral)  Resp 20  Ht 5\' 11"  (1.803 m)  Wt 122 lb 2.2 oz (55.4 kg)  BMI 17.04 kg/m2  SpO2 96%  LMP 11/16/2014  Discharge Labs:  Results for orders placed or performed during the hospital encounter of 03/10/15 (from the past 24 hour(s))  Renal function panel     Status: Abnormal   Collection Time: 03/18/15  7:45 AM  Result Value Ref Range   Sodium 140 135 - 145 mmol/L   Potassium 3.9 3.5 - 5.1 mmol/L   Chloride 108 101 - 111 mmol/L   CO2 21 (L) 22 - 32 mmol/L   Glucose, Bld 90 65 - 99 mg/dL   BUN 33 (H) 6 - 20 mg/dL   Creatinine, Ser 6.39 (H) 0.44 - 1.00 mg/dL   Calcium 7.5 (L) 8.9 - 10.3 mg/dL   Phosphorus 7.5 (H) 2.5 - 4.6 mg/dL   Albumin <1.0 (L) 3.5 - 5.0 g/dL   GFR calc non Af Amer 7 (L) >60 mL/min   GFR calc Af Amer 9 (L) >60 mL/min   Anion gap 11 5 - 15  CBC     Status: Abnormal   Collection Time: 03/18/15  7:45 AM  Result Value Ref Range   WBC 7.9 4.0 - 10.5 K/uL   RBC 2.94 (L) 3.87 - 5.11 MIL/uL   Hemoglobin 8.2 (L) 12.0 - 15.0 g/dL   HCT 25.1 (L) 36.0 -  46.0 %   MCV 85.4 78.0 - 100.0 fL   MCH 27.9 26.0 - 34.0 pg   MCHC 32.7 30.0 - 36.0 g/dL   RDW 19.0 (H) 11.5 - 15.5 %   Platelets 129 (L) 150 - 400 K/uL    Signed: Iline Oven, MD 03/18/2015, 1:34 PM    Services Ordered on Discharge:  Equipment Ordered on  Discharge:

## 2015-03-16 NOTE — Op Note (Signed)
[name]   MRN: [id number] DOB: 03-18-1975    DATE OF OPERATION: 03/16/2015  PREOP DIAGNOSIS: Stage IV chronic kidney disease  POSTOP DIAGNOSIS: Same  PROCEDURE:  1. Removal of temporary dialysis catheter right IJ 2. Ultrasound-guided placement of tunneled dialysis catheter (right IJ) 3. Basilic vein transposition  SURGEON: Judeth Cornfield. Scot Dock, MD, FACS  ASSIST: Leontine Locket, PA  ANESTHESIA: local with sedation   EBL: minimal  INDICATIONS: Paula Becker is a 40 y.o. female who presents for new access.  FINDINGS: the left upper arm basilic vein was approximately 3 mm and I felt it was reasonable to attempt a fistula. The temporary dialysis catheter was too high in the neck and therefore I removed this and placed a whole new catheter at a lower position to prevent kinking of the catheter.  TECHNIQUE:  The patient was brought to the operating room and sedated by anesthesia. The temporary dialysis catheter in the right neck was fairly high and I felt that placing a new catheter over a wire would result in kinking of the catheter in the neck. I therefore removed this catheter after the sutures were cut and pressure was held for hemostasis.  Next the neck and upper chest were prepped and draped in usual sterile fashion. Under ultrasound guidance, after the skin was anesthetized, the right IJ was cannulated and a guidewire introduced into the  suprarenal cava under fluoroscopic control. The tract over the wire was dilated and then a dilator and peel-away sheath were advanced over the wire and the wire and dilator removed. The catheter was passed through the peel-away sheath and positioned in the right atrium. The exit site for the catheter was selected and the skin anesthetized between the 2 areas the catheter was then brought to the tunnel, cut to the appropriate length, and the distal ports were attached. Both ports withdrew easily with an flushed with heparinized saline and  filled with concentrated heparin. The catheter was secured at its exit site with 3-0 nylon suture. The IJ cannulation site was closed with Dermabond.  Next attention was turned to the left arm. By ultrasound felt the basilic vein in the upper arm was reasonable to attempt a basilic vein transposition. The left upper extremity was prepped and draped in usual sterile fashion After the skin was asked to ties with functional lidocaine, using 2 longitudinal incisions, the basilic vein was harvested from the antecubital level to the proximal upper arm. Branches were divided between clips and 3-0 silk ties. Through the distal incision the brachial artery was dissected free beneath the fascia. A tunnel was created between the 2 incisions and the patient was then heparinized. The vein was ligated distally and then irrigated up with heparinized saline. It was then brought through the tunnel after was marked to prevent twisting. The brachial artery was Proximal and distally and a longitudinal arteriotomy was made. The vein was spatulated and sewn end-to-side to the artery using continuous 6-0 Prolene suture. At the completion of those good thrill in the fistula and a radial and ulnar signal with the Doppler. The heparin was partially reversed with protamine. The wounds were closed with a deep layer of 3-0 Vicryl and the skin closed with 4-0 Vicryl. Liquid and was applied. The patient tolerated the procedure well and was transferred to recovery in stable condition. All needle and sponge counts were correct.  Deitra Mayo, MD, FACS [facility]  DATE OF DICTATION:   03/16/2015

## 2015-03-16 NOTE — Transfer of Care (Signed)
Immediate Anesthesia Transfer of Care Note  Patient: Paula Becker  Procedure(s) Performed: Procedure(s): INSERTION OF DIALYSIS CATHETER RIGHT INTERNAL JUGULAR (N/A) BASCILIC VEIN TRANSPOSITION LEFT UPPER ARM (Left)  Patient Location: PACU  Anesthesia Type:MAC  Level of Consciousness: awake, alert  and oriented  Airway & Oxygen Therapy: Patient Spontanous Breathing  Post-op Assessment: Report given to RN, Post -op Vital signs reviewed and stable and Patient moving all extremities X 4  Post vital signs: Reviewed and stable  Last Vitals:  Filed Vitals:   03/16/15 0524 03/16/15 1226  BP: 131/93 121/86  Pulse: 90 110  Temp: 36.9 C   Resp: 22 30    Complications: No apparent anesthesia complications

## 2015-03-16 NOTE — Progress Notes (Signed)
Subjective: Yesterday per nursing they mentioned that patient continued to refuse her medications stating that she would not take them on an empty stomach. She also mentioned a worsening of her cough that started yesterday but is not having a sputum production. She denies any rhinorrhea, nausea, vomiting, diarrhea, or constipation.  Objective: Vital signs in last 24 hours: Filed Vitals:   03/16/15 1230 03/16/15 1245 03/16/15 1300 03/16/15 1315  BP:  123/90    Pulse: 108 98 94 96  Temp:      TempSrc:      Resp: 27 21 10 12   Height:      Weight:      SpO2: 88% 97% 98% 96%   Weight change: -0.1 kg (-3.5 oz)  Intake/Output Summary (Last 24 hours) at 03/16/15 1327 Last data filed at 03/16/15 1210  Gross per 24 hour  Intake    500 ml  Output     50 ml  Net    450 ml   Physical Exam Physical Exam  Constitutional: She is oriented to person, place, and time.  Cachexic appearing  HENT:  Head: Normocephalic.  Mouth/Throat: Oropharynx is clear and moist.  Eyes: Conjunctivae and EOM are normal.  Cardiovascular: Normal rate, regular rhythm and normal heart sounds.   Pulmonary/Chest: Effort normal.  Mild expiratory rhonchi heard in the left upper border  Abdominal: Soft. Bowel sounds are normal. She exhibits no distension. There is no tenderness.  Musculoskeletal: She exhibits no edema.  Neurological: She is alert and oriented to person, place, and time.     Lab Results: Blood pressure 123/90, pulse 96, temperature 97.2 F (36.2 C), temperature source Oral, resp. rate 12, height 5\' 11"  (1.803 m), weight 55.7 kg (122 lb 12.7 oz), last menstrual period 11/16/2014, SpO2 96 %.  Micro Results: Recent Results (from the past 240 hour(s))  Blood culture (routine x 2)     Status: None   Collection Time: 03/10/15  4:55 PM  Result Value Ref Range Status   Specimen Description BLOOD RIGHT ANTECUBITAL  Final   Special Requests IN PEDIATRIC BOTTLE 4CC  Final   Culture NO GROWTH 5 DAYS   Final   Report Status 03/15/2015 FINAL  Final  Blood culture (routine x 2)     Status: None   Collection Time: 03/10/15  6:04 PM  Result Value Ref Range Status   Specimen Description BLOOD RIGHT HAND  Final   Special Requests IN PEDIATRIC BOTTLE 4CC  Final   Culture NO GROWTH 5 DAYS  Final   Report Status 03/15/2015 FINAL  Final  Surgical pcr screen     Status: None   Collection Time: 03/16/15  4:51 AM  Result Value Ref Range Status   MRSA, PCR NEGATIVE NEGATIVE Final   Staphylococcus aureus NEGATIVE NEGATIVE Final    Comment:        The Xpert SA Assay (FDA approved for NASAL specimens in patients over 14 years of age), is one component of a comprehensive surveillance program.  Test performance has been validated by [facility] for patients greater than or equal to 56 year old. It is not intended to diagnose infection nor to guide or monitor treatment.    Studies/Results: Dg Chest Port 1 View  03/16/2015  CLINICAL DATA:  Dialysis catheter insertion EXAM: CHEST 1 VIEW ; intraoperative fluoroscopy COMPARISON:  March 10, 2015 FLUOROSCOPY TIME:  0 minutes 11 second; no submitted image. FINDINGS: Dual-lumen central catheter is present. The distal tip is just beyond the cavoatrial  junction in the right atrium. There is no demonstrable pneumothorax. There is moderate interstitial edema in the bases with small pleural effusions bilaterally. Heart size and contour within normal limits. There is layering effusion on the right. No adenopathy evident. IMPRESSION: Central catheter present without pneumothorax. The distal tip is in the right atrium slightly beyond the cavoatrial junction. There are pleural effusions bilaterally, slightly larger on the right than on the left with bibasilar atelectasis. No change in cardiac silhouette. Electronically Signed   By: Lowella Grip III M.D.   On: 03/16/2015 13:09   Dg Fluoro Guide Cv Line-no Report  03/16/2015  CLINICAL DATA:  FLOURO GUIDE CV LINE  Fluoroscopy was utilized by the requesting physician.  No radiographic interpretation.    Scheduled Meds: . [MAR Hold] sodium chloride   Intravenous Once  . [MAR Hold] sodium chloride   Intravenous Once  . [MAR Hold] azithromycin  1,200 mg Oral Weekly  . [MAR Hold] darbepoetin (ARANESP) injection - DIALYSIS  100 mcg Intravenous Q Thu-HD  . [MAR Hold] darunavir  800 mg Oral Q breakfast  . [MAR Hold] dolutegravir  50 mg Oral BID  . [MAR Hold] doxercalciferol  2 mcg Intravenous Q T,Th,Sa-HD  . [MAR Hold] feeding supplement  1 Container Oral TID BM  . [MAR Hold] fluconazole  100 mg Oral Daily  . [MAR Hold] heparin subcutaneous  5,000 Units Subcutaneous 3 times per day  . [MAR Hold] multivitamin  1 tablet Oral QHS  . [MAR Hold] ritonavir  100 mg Oral Q breakfast  . [MAR Hold] sodium chloride flush  3 mL Intravenous Q12H  . [MAR Hold] sulfamethoxazole-trimethoprim  1 tablet Oral Once per day on Mon Wed Fri  . [MAR Hold] tenofovir  300 mg Oral Weekly  . [MAR Hold] valACYclovir  500 mg Oral Daily  . [MAR Hold] zidovudine  300 mg Oral Q breakfast   Continuous Infusions: . sodium chloride 10 mL/hr at 03/16/15 0903   PRN Meds:.[MAR Hold] sodium chloride, [MAR Hold] sodium chloride, [MAR Hold] acetaminophen **OR** [MAR Hold] acetaminophen, [MAR Hold] alteplase, [MAR Hold] heparin, HYDROmorphone (DILAUDID) injection, [MAR Hold] lidocaine (PF), [MAR Hold] lidocaine-prilocaine, meperidine (DEMEROL) injection, [MAR Hold] ondansetron **OR** [MAR Hold] ondansetron (ZOFRAN) IV, ondansetron (ZOFRAN) IV, oxyCODONE, [MAR Hold] pentafluoroprop-tetrafluoroeth Assessment/Plan: Principal Problem:   Acute on chronic renal failure (HCC) Active Problems:   Anemia of chronic disease   Hypomagnesemia   HSV (herpes simplex virus) anogenital infection   Chronic cough   Diarrhea   Vitamin D deficiency   Secondary hyperparathyroidism of renal origin ([place])   AIDS (acquired immune deficiency syndrome) ([place])    Uremia   FSGS (focal segmental glomerulosclerosis)   Malnutrition of moderate degree   Dialysis patient [facility])   [name] is a 40 yo female with a history of medical non-adherence to HAART therapy , AIDS w/ (CD4 of 63) with recent CAP in January who presents with 3-7 days of n/v/d found to have acute on chronic renal failure and symptomatic anemia requiring emergent HD. She recently received pRBC transfusions to address her anemia likely due to her chronic disease/ESRD. Today she underwent, AV graft placement without complications, and has been setup for outpatient HD when she is discharged at [facility].  HIVAN now on dialysis: Pt currently has temp IJ catheter in place with plans for a tunnel catheter on Tuesday, and HD on Monday. CLIP pending for outpatient HD. Patient made aware of the what AV graft consist of.  -Nephrology and  VVS appreciated recs -AVG placed today w/o complications -Daily renal function not necessary at this time -continue aranesp, hectorol, MV  AIDS: Last CD4 count 40 in January 2017 with HIV RNA level of 20,700. Patient is currently on salvage therapy with Tivicay 50 mg BID, Darunavir 800 mg daily, ritonavir 100 mg daily, Zidovudine 300 mg daily and Tenofovir 300 mg weekly; however, patient has been refusing her antiviral medications, despite nursing crushing her medications to make it easier for her to take.   -ID consulted, and stated that when she is going home that she may need to consider blister pack or education with pharmacy with calendar and pill box since there is a concern about her literacy and comprehension of the medication schedule. We can attempt to see what medications she is  -Bactrim 3 times weekly, renally dosed -Azithromycin Qweek  -Fluconazole 100 mg daily -Tivicay 50 mg BID -Darunavir 800 mg daily -Ritonavir 100 mg daily -Zidovudine 300 mg daily -Tenofovir 300 mg weekly  HSV: Patient has recurrent HSV skin lesions  notably in her gluteal cleft. She recently mentioned it was the "blue pill" that she does not like to take which is likely Valtrex and ID recommends completing the course. If we are able to compromise with her being medically adherent to taking her HAART therapy with the tradeoff being she stops the Valtrex as she does not like taking this medication -Valtrex 500 mg daily (give after HD on HD days) x 7 days  Normocytic Anemia: Likely secondary to CKD. Most recent Hgb to 9.7 after  pRBC transfusions. -Last CBC was 80.1 within normocytic range. 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 Patient does not want eat the hospital food but has no LOA and is willing to eat outside food.I'm concerned about worsening malnutrition, and think we could consider allowing her to eat more freely and add-on phosph binder if necessary given that she will likely eat whatever she wants on discharge. Alternatively since she is getting HD she can likely tolerate a more variable diet. -Boost Breeze TID -Nutrition following  Vitamin D Deficiency: 25-OH Vitamin D level <4.0 on admission.  -Doxercalciferol with HD to prevent secondary hyperPTH  DVT/PE ppx: Heparin SQ TID  Dispo: Disposition is deferred at this time, awaiting improvement of current medical problems. Anticipated discharge in approximately 2-3 day(s).  Bethena Roys from inpatient hemodialysis has confirmed the CLIP process has been setup for the patient to receive outpatient hemodialysis on a T/Th/Sat schedule at [place] [facility].  The patient does have a current PCP Campbell Riches, MD) and does not need an [facility] hospital follow-up appointment after discharge.  The patient does have transportation limitations that hinder transportation to clinic appointments.   This is a Careers information officer Note.  The care of the patient was discussed and the assessment and plan formulated  with their assistance.  Please see their attached note for official documentation of the daily encounter.   LOS: 6 days   Delena Serve., Med Student 03/16/2015, 1:27 PM

## 2015-03-16 NOTE — Interval H&P Note (Signed)
History and Physical Interval Note:  03/16/2015 9:17 AM  Paula Becker  has presented today for surgery, with the diagnosis of End Stage Renal Disease N18.6  The various methods of treatment have been discussed with the patient and family. After consideration of risks, benefits and other options for treatment, the patient has consented to  Procedure(s): ARTERIOVENOUS (AV) FISTULA CREATION VS GRAFT INSERTION (Left) INSERTION OF DIALYSIS CATHETER (N/A) as a surgical intervention .  The patient's history has been reviewed, patient examined, no change in status, stable for surgery.  I have reviewed the patient's chart and labs.  Questions were answered to the patient's satisfaction.     Deitra Mayo

## 2015-03-16 NOTE — Progress Notes (Signed)
03/16/2015 1:37 PM Hemodialysis Outpatient Note; this patient has been accepted at the [place] center on a Tuesday, Thursday and Saturday 2nd shift schedule. Treatment can begin on Thursday February 16th. Please have patient arrive at 11:00 AM to sign paperwork and consents. Thank you. Gordy Savers

## 2015-03-16 NOTE — Consult Note (Signed)
[facility] Psychiatry Consult   Reason for Consult:  Depression and non compliant with treatment including dialysis Referring Physician:  Dr. Eppie Gibson Patient Identification: Paula Becker MRN:  [id number] Principal Diagnosis: Other depression due to general medical condition Diagnosis:   Patient Active Problem List   Diagnosis Date Noted  . 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 Dialysis patient ([place]) [Z99.2]   . Malnutrition of moderate degree [E44.0] 03/12/2015  . FSGS (focal segmental glomerulosclerosis) [N03.2] 03/11/2015  . AIDS (acquired immune deficiency syndrome) ([place]) [B20]   . Uremia [N19]   . Acute on chronic renal failure ([place]) [N17.9, N18.9] 03/10/2015  . Pressure ulcer [L89.90] 03/10/2015  . Vitamin D deficiency [E55.9] 03/10/2015  . Secondary hyperparathyroidism of renal origin ([place]) [N25.81] 03/10/2015  . Diarrhea [R19.7] 02/06/2015  . 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 Shingles [B02.9] 11/30/2014  . Back pain, chronic [M54.9, G89.29]   . 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 Recurrent pneumonia [J18.9] 07/11/2011  . Noncompliance with medication treatment due to difficulty with route of medication administration [Z91.14] 03/15/2011  . Folliculitis [Z66.0] 63/01/6008  . Chronic cough [R05] 10/14/2010  . HSV (herpes simplex virus) anogenital infection [A60.9] 08/19/2010  . Anemia of chronic disease [D63.8] 05/31/2010  . Severe protein-calorie malnutrition ([place]) [E43] 05/31/2010  . Unspecified episodic mood disorder [F39] 05/31/2010  . Hypomagnesemia [E83.42] 05/31/2010  . Human immunodeficiency virus (HIV) disease ([place]) [B20] 04/08/2010    Total Time spent with patient: 1 hour  Subjective:   Paula Becker is a 40 y.o. female patient admitted with nausea, vomiting and diarrhea.  HPI:  Paula Becker is a 40 year old female seen, chart reviewed and case discussed with the staff RN for face-to-face  psychiatric consultation and evaluation of depression associated with chronic medical conditions including poorly controlled AIDS complicated by FSGS and HSV. Patient has been suffering with a four-day history of nausea, vomiting, diarrhea, and abdominal pain. Patient has been suffering with poor appetite, resulted in extreme weakness. Patient denies symptoms of depression, anxiety, psychosis, suicidal/homicidal ideation. Patient endorses staying at home mostly, less socialization, isolated, withdrawn and also disturbed appetite and maybe sleep. Patient is taking 20 different kind of medications and has been tired of taking medication and refusing to take medication from time to time. Patient required hemodialysis which started during this hospitalization receiving Tuesday, Thursday and Saturdays. Primary team is concerned about patient may not be able to compliant with her hemodialysis and she does not feel good because of depression secondary to ongoing multiple medical problems and psychosocial stresses. Patient reported she has been living with her father and her father is supportive and Lacinda Axon and feed her. Patient reportedly spent her own when she gets disability check. Patient also has a 10 years old son who has been graduating from school this year. Patient with the dual minded when offered medication Remeron disintegrated 15 mg at bedtime for depression, insomnia and appetite, patient may refuse because of noncommittal at the time of discussion. Patient does not meet criteria for ECT.  Past Psychiatric History: Patient has no history of acute psychiatric hospitalization or outpatient medication management.  Risk to Self: Is patient at risk for suicide?: No Risk to Others:   Prior Inpatient Therapy:   Prior Outpatient Therapy:    Past Medical History:  Past Medical History  Diagnosis Date  . HIV (human immunodeficiency virus infection) ([place])   . MRSA (methicillin resistant staph aureus) culture  positive   . Necrotizing pneumonia (  [place]) 07/2010  . Pneumonia 06/23/11    RLL patchy, nodular lung disease  . Anemia of chronic disease   . History of noncompliance with medical treatment   . Herpes     Past Surgical History  Procedure Laterality Date  . Cesarean section  1998  . Breast surgery  03/2010    right; "don't know what they did"  . Video bronchoscopy  06/28/2011    Procedure: VIDEO BRONCHOSCOPY WITH FLUORO;  Surgeon: Chesley Mires, MD;  Location: [facility];  Service: Cardiopulmonary;  Laterality: Bilateral;   Family History:  Family History  Problem Relation Age of Onset  . Lung disease Mother     passed away at 38 with lung disease  . Hypertension Father    Family Psychiatric  History: Not significant for mental illness. Social History:  History  Alcohol Use No     History  Drug Use No    Social History   Social History  . Marital Status: Single    Spouse Name: N/A  . Number of Children: N/A  . Years of Education: N/A   Social History Main Topics  . Smoking status: Never Smoker   . Smokeless tobacco: Never Used  . Alcohol Use: No  . Drug Use: No  . Sexual Activity: Not Asked     Comment: declined condoms   Other Topics Concern  . None   Social History Narrative   Lives in [place] in her house.   Has support from her Dad for doctor visits.   Didn't work ever. Not working now.   Additional Social History:    Allergies:   Allergies  Allergen Reactions  . Shellfish-Derived Products Swelling    Facial swelling  . Vancomycin Rash    Labs:  Results for orders placed or performed during the hospital encounter of 03/10/15 (from the past 48 hour(s))  Renal function panel     Status: Abnormal   Collection Time: 03/15/15  7:50 AM  Result Value Ref Range   Sodium 139 135 - 145 mmol/L   Potassium 3.8 3.5 - 5.1 mmol/L   Chloride 105 101 - 111 mmol/L   CO2 23 22 - 32 mmol/L   Glucose, Bld 83 65 - 99 mg/dL   BUN 23 (H) 6 - 20 mg/dL   Creatinine, Ser  5.68 (H) 0.44 - 1.00 mg/dL   Calcium 7.1 (L) 8.9 - 10.3 mg/dL   Phosphorus 5.8 (H) 2.5 - 4.6 mg/dL   Albumin <1.0 (L) 3.5 - 5.0 g/dL   GFR calc non Af Amer 9 (L) >60 mL/min   GFR calc Af Amer 10 (L) >60 mL/min    Comment: (NOTE) The eGFR has been calculated using the CKD EPI equation. This calculation has not been validated in all clinical situations. eGFR's persistently <60 mL/min signify possible Chronic Kidney Disease.    Anion gap 11 5 - 15  Surgical pcr screen     Status: None   Collection Time: 03/16/15  4:51 AM  Result Value Ref Range   MRSA, PCR NEGATIVE NEGATIVE   Staphylococcus aureus NEGATIVE NEGATIVE    Comment:        The Xpert SA Assay (FDA approved for NASAL specimens in patients over 52 years of age), is one component of a comprehensive surveillance program.  Test performance has been validated by [facility] for patients greater than or equal to 41 year old. It is not intended to diagnose infection nor to guide or monitor treatment.  Current Facility-Administered Medications  Medication Dose Route Frequency Provider Last Rate Last Dose  . 0.9 %  sodium chloride infusion   Intravenous Once Ezequiel Essex, MD      . 0.9 %  sodium chloride infusion  100 mL Intravenous PRN Dwana Melena, MD      . 0.9 %  sodium chloride infusion  100 mL Intravenous PRN Dwana Melena, MD      . 0.9 %  sodium chloride infusion   Intravenous Once Marjan Rabbani, MD      . 0.9 %  sodium chloride infusion   Intravenous Continuous Angelia Mould, MD 10 mL/hr at 03/16/15 [phone]    . acetaminophen (TYLENOL) tablet 650 mg  650 mg Oral Q6H PRN Juluis Mire, MD       Or  . acetaminophen (TYLENOL) suppository 650 mg  650 mg Rectal Q6H PRN Marjan Rabbani, MD      . alteplase (CATHFLO ACTIVASE) injection 2 mg  2 mg Intracatheter Once PRN Dwana Melena, MD      . azithromycin [facility]) tablet 1,200 mg  1,200 mg Oral Weekly Lauren D Bajbus, RPH   1,200 mg at 03/13/15 2005  .  Darbepoetin Alfa (ARANESP) injection 100 mcg  100 mcg Intravenous Q Thu-HD Fleet Contras, MD   100 mcg at 03/11/15 1449  . Darunavir Ethanolate (PREZISTA) tablet 800 mg  800 mg Oral Q breakfast Alexa Sherral Hammers, MD   800 mg at 03/13/15 1200  . dolutegravir (TIVICAY) tablet 50 mg  50 mg Oral BID Alexa Sherral Hammers, MD   50 mg at 03/15/15 2234  . doxercalciferol (HECTOROL) injection 2 mcg  2 mcg Intravenous Q T,Th,Sa-HD Fleet Contras, MD   2 mcg at 03/13/15 1013  . feeding supplement (BOOST / RESOURCE BREEZE) liquid 1 Container  1 Container Oral TID BM Liberty Handy, MD   1 Container at 03/13/15 1115  . fluconazole (DIFLUCAN) tablet 100 mg  100 mg Oral Daily Marjan Rabbani, MD   100 mg at 03/11/15 7867  . heparin injection 1,000 Units  1,000 Units Dialysis PRN Dwana Melena, MD      . heparin injection 5,000 Units  5,000 Units Subcutaneous 3 times per day Hulen Shouts Rhyne, PA-C      . lidocaine (PF) (XYLOCAINE) 1 % injection 5 mL  5 mL Intradermal PRN Dwana Melena, MD      . lidocaine-prilocaine (EMLA) cream 1 application  1 application Topical PRN Dwana Melena, MD      . multivitamin (RENA-VIT) tablet 1 tablet  1 tablet Oral QHS Fleet Contras, MD   1 tablet at 03/15/15 2234  . ondansetron (ZOFRAN) tablet 4 mg  4 mg Oral Q6H PRN Juluis Mire, MD       Or  . ondansetron (ZOFRAN) injection 4 mg  4 mg Intravenous Q6H PRN Marjan Rabbani, MD   4 mg at 03/16/15 1506  . oxyCODONE (Oxy IR/ROXICODONE) immediate release tablet 5 mg  5 mg Oral Q6H PRN Samantha J Rhyne, PA-C      . pentafluoroprop-tetrafluoroeth (GEBAUERS) aerosol 1 application  1 application Topical PRN Dwana Melena, MD      . ritonavir (NORVIR) tablet 100 mg  100 mg Oral Q breakfast Alexa Sherral Hammers, MD   100 mg at 03/13/15 1200  . sodium chloride flush (NS) 0.9 % injection 3 mL  3 mL Intravenous Q12H Marjan Rabbani, MD   10 mL at 03/15/15 2235  . sulfamethoxazole-trimethoprim (BACTRIM,SEPTRA) 400-80  MG per tablet 1 tablet  1 tablet  Oral Once per day on Mon Wed Fri Juluis Mire, MD   1 tablet at 03/11/15 0106  . tenofovir (VIREAD) tablet 300 mg  300 mg Oral Weekly Lauren D Bajbus, RPH   300 mg at 03/13/15 2005  . zidovudine (RETROVIR) capsule 300 mg  300 mg Oral Q breakfast Alexa Sherral Hammers, MD   300 mg at 03/13/15 1200    Musculoskeletal: Strength & Muscle Tone: decreased Gait & Station: unable to stand Patient leans: N/A  Psychiatric Specialty Exam: Review of Systems  Constitutional: Positive for weight loss and malaise/fatigue.  Musculoskeletal: Positive for myalgias.  Neurological: Positive for dizziness, weakness and headaches.  Psychiatric/Behavioral: Positive for depression. The patient is nervous/anxious and has insomnia.      Blood pressure 123/90, pulse 96, temperature 97.1 F (36.2 C), temperature source Oral, resp. rate 12, height '5\' 11"'  (1.803 m), weight 55.7 kg (122 lb 12.7 oz), last menstrual period 11/16/2014, SpO2 96 %.Body mass index is 17.13 kg/(m^2).  General Appearance: Guarded  Eye Contact::  Good  Speech:  Clear and Coherent and Slow  Volume:  Decreased  Mood:  Depressed  Affect:  Constricted and Depressed  Thought Process:  Coherent and Goal Directed  Orientation:  Full (Time, Place, and Person)  Thought Content:  Rumination  Suicidal Thoughts:  No  Homicidal Thoughts:  No  Memory:  Immediate;   Fair Recent;   Fair  Judgement:  Fair  Insight:  Fair  Psychomotor Activity:  Decreased  Concentration:  Fair  Recall:  [employer] of Knowledge:Fair  Language: Good  Akathisia:  Negative  Handed:  Right  AIMS (if indicated):     Assets:  Communication Skills Desire for Improvement Financial Resources/Insurance Housing Leisure Time Resilience Social Support Transportation  ADL's:  Impaired  Cognition: WNL  Sleep:      Treatment Plan Summary: Daily contact with patient to assess and evaluate symptoms and progress in treatment and Medication management  Patient has no  safety concerns Patient consult to be compliant with her treatment as recommended by primary treatment team Will provide Remeron disintegrated tablet 15 mg at bedtime for depression, poor appetite and insomnia Recent medical refused to take this medication because of noncommittal during my discussion Appreciate psychiatric consultation and follow up as clinically required Please contact [phone] or [phone] if needs further assistance  Disposition: Patient does not meet criteria for psychiatric inpatient admission. Supportive therapy provided about ongoing stressors.  Durward Parcel., MD 03/16/2015 5:58 PM

## 2015-03-16 NOTE — Anesthesia Procedure Notes (Signed)
Procedure Name: MAC Date/Time: 03/16/2015 9:43 AM Performed by: Garrison [place] T Pre-anesthesia Checklist: Patient identified, Emergency Drugs available, Suction available and Patient being monitored Patient Re-evaluated:Patient Re-evaluated prior to inductionOxygen Delivery Method: Simple face mask Preoxygenation: Pre-oxygenation with 100% oxygen Intubation Type: IV induction Placement Confirmation: positive ETCO2 and breath sounds checked- equal and bilateral Dental Injury: Teeth and Oropharynx as per pre-operative assessment

## 2015-03-16 NOTE — H&P (View-Only) (Signed)
VASCULAR SURGERY:  The patient is scheduled for placement of a left arm AV graft and a tunneled dialysis catheter tomorrow. I discussed the procedure and risks with the patient yesterday and she is agreeable to proceed.  Deitra Mayo, MD, [place] [phone] Office: [phone]

## 2015-03-17 ENCOUNTER — Encounter (HOSPITAL_COMMUNITY): Payer: Self-pay | Admitting: Vascular Surgery

## 2015-03-17 MED ORDER — RITONAVIR 100 MG PO TABS: 100.0000 mg | ORAL_TABLET | Freq: Every day | ORAL | Status: DC

## 2015-03-17 MED ORDER — RENA-VITE PO TABS: 1.0000 | ORAL_TABLET | Freq: Every day | ORAL | Status: DC

## 2015-03-17 MED ORDER — TENOFOVIR DISOPROXIL FUMARATE 300 MG PO TABS: 300.0000 mg | ORAL_TABLET | ORAL | Status: DC

## 2015-03-17 MED ORDER — DOLUTEGRAVIR SODIUM 50 MG PO TABS: 50.0000 mg | ORAL_TABLET | Freq: Two times a day (BID) | ORAL | Status: DC

## 2015-03-17 MED ORDER — ZIDOVUDINE 100 MG PO CAPS: 300.0000 mg | ORAL_CAPSULE | Freq: Every day | ORAL | Status: DC

## 2015-03-17 MED ORDER — DARUNAVIR ETHANOLATE 800 MG PO TABS: 800.0000 mg | ORAL_TABLET | Freq: Every day | ORAL | Status: DC

## 2015-03-17 MED ORDER — DARBEPOETIN ALFA 100 MCG/0.5ML IJ SOSY: 100.0000 ug | PREFILLED_SYRINGE | INTRAMUSCULAR | Status: DC

## 2015-03-17 MED ORDER — BOOST / RESOURCE BREEZE PO LIQD: 1.0000 | Freq: Three times a day (TID) | ORAL | Status: DC

## 2015-03-17 MED ORDER — MIRTAZAPINE 15 MG PO TBDP
15.0000 mg | ORAL_TABLET | Freq: Every day | ORAL | Status: DC
  Filled 2015-03-17 (×2): qty 1

## 2015-03-17 MED ORDER — DOXERCALCIFEROL 4 MCG/2ML IV SOLN: 2.0000 ug | INTRAVENOUS | Status: DC

## 2015-03-17 NOTE — Progress Notes (Signed)
Subjective: No acute events overnight. This morning patient did not eat her breakfast doesn't like the food here. Her AV graft was placed yesterday in her left upper arm and a right tunnel catheter was placed as well. She endorses some soreness of her left arm and feels tired but otherwise no other complaints.  Objective: Vital signs in last 24 hours: Filed Vitals:   03/16/15 2107 03/17/15 0500 03/17/15 0605 03/17/15 1446  BP: 146/96  130/86 135/84  Pulse: 119  96 106  Temp: 99.1 F (37.3 C)  98.3 F (36.8 C) 98.6 F (37 C)  TempSrc: Oral  Oral Oral  Resp: 18  18 20   Height:      Weight:  56 kg (123 lb 7.3 oz)    SpO2: 99%   96%   Weight change: 0 kg (0 lb) No intake or output data in the 24 hours ending 03/17/15 1448  Physical Exam  Constitutional: She is oriented to person, place, and time.  Cachexic appearing, in no distress   Eyes: EOM are normal.  Cardiovascular: Normal rate, regular rhythm and normal heart sounds.   Respiratory:  Deferred as patient states she is not able to sit up despite attempts to assist her in sitting up to auscultate lung fields.  GI: Soft. Bowel sounds are normal. She exhibits no distension. There is no tenderness. There is no rebound.  Musculoskeletal: She exhibits no edema.  Neurological: She is alert and oriented to person, place, and time.  Skin: Skin is warm and dry.   Lab Results: No recent labs in past 24 hours Micro Results: Recent Results (from the past 240 hour(s))  Blood culture (routine x 2)     Status: None   Collection Time: 03/10/15  4:55 PM  Result Value Ref Range Status   Specimen Description BLOOD RIGHT ANTECUBITAL  Final   Special Requests IN PEDIATRIC BOTTLE 4CC  Final   Culture NO GROWTH 5 DAYS  Final   Report Status 03/15/2015 FINAL  Final  Blood culture (routine x 2)     Status: None   Collection Time: 03/10/15  6:04 PM  Result Value Ref Range Status   Specimen Description BLOOD RIGHT HAND  Final   Special  Requests IN PEDIATRIC BOTTLE 4CC  Final   Culture NO GROWTH 5 DAYS  Final   Report Status 03/15/2015 FINAL  Final  Surgical pcr screen     Status: None   Collection Time: 03/16/15  4:51 AM  Result Value Ref Range Status   MRSA, PCR NEGATIVE NEGATIVE Final   Staphylococcus aureus NEGATIVE NEGATIVE Final    Comment:        The Xpert SA Assay (FDA approved for NASAL specimens in patients over 61 years of age), is one component of a comprehensive surveillance program.  Test performance has been validated by [facility] for patients greater than or equal to 4 year old. It is not intended to diagnose infection nor to guide or monitor treatment.    Studies/Results: Dg Chest Port 1 View  03/16/2015  CLINICAL DATA:  Dialysis catheter insertion EXAM: CHEST 1 VIEW ; intraoperative fluoroscopy COMPARISON:  March 10, 2015 FLUOROSCOPY TIME:  0 minutes 11 second; no submitted image. FINDINGS: Dual-lumen central catheter is present. The distal tip is just beyond the cavoatrial junction in the right atrium. There is no demonstrable pneumothorax. There is moderate interstitial edema in the bases with small pleural effusions bilaterally. Heart size and contour within normal limits. There is layering effusion  on the right. No adenopathy evident. IMPRESSION: Central catheter present without pneumothorax. The distal tip is in the right atrium slightly beyond the cavoatrial junction. There are pleural effusions bilaterally, slightly larger on the right than on the left with bibasilar atelectasis. No change in cardiac silhouette. Electronically Signed   By: Lowella Grip III M.D.   On: 03/16/2015 13:09   Dg Fluoro Guide Cv Line-no Report  03/16/2015  CLINICAL DATA:  FLOURO GUIDE CV LINE Fluoroscopy was utilized by the requesting physician.  No radiographic interpretation.   Medications Scheduled Meds: . sodium chloride   Intravenous Once  . sodium chloride   Intravenous Once  . azithromycin  1,200 mg  Oral Weekly  . darbepoetin (ARANESP) injection - DIALYSIS  100 mcg Intravenous Q Thu-HD  . darunavir  800 mg Oral Q breakfast  . dolutegravir  50 mg Oral BID  . doxercalciferol  2 mcg Intravenous Q T,Th,Sa-HD  . feeding supplement  1 Container Oral TID BM  . fluconazole  100 mg Oral Daily  . heparin subcutaneous  5,000 Units Subcutaneous 3 times per day  . mirtazapine  15 mg Oral QHS  . multivitamin  1 tablet Oral QHS  . ritonavir  100 mg Oral Q breakfast  . sodium chloride flush  3 mL Intravenous Q12H  . sulfamethoxazole-trimethoprim  1 tablet Oral Once per day on Mon Wed Fri  . tenofovir  300 mg Oral Weekly  . zidovudine  300 mg Oral Q breakfast   Continuous Infusions: . sodium chloride 10 mL/hr at 03/16/15 0903   PRN Meds:.sodium chloride, sodium chloride, acetaminophen **OR** acetaminophen, alteplase, heparin, lidocaine (PF), lidocaine-prilocaine, ondansetron **OR** ondansetron (ZOFRAN) IV, oxyCODONE, pentafluoroprop-tetrafluoroeth Assessment/Plan: Principal Problem:   Other depression due to general medical condition Active Problems:   Anemia of chronic disease   Hypomagnesemia   HSV (herpes simplex virus) anogenital infection   Chronic cough   Diarrhea   Acute on chronic renal failure (HCC)   Vitamin D deficiency   Secondary hyperparathyroidism of renal origin ([place])   AIDS (acquired immune deficiency syndrome) ([place])   Uremia   FSGS (focal segmental glomerulosclerosis)   Malnutrition of moderate degree   Dialysis patient ([place])   CKD (chronic kidney disease) stage V requiring chronic dialysis ([place])   For [name], 40 year old female with history of medication non-adherence,  Expand All Collapse All    Subjective: Yesterday per nursing they mentioned that patient continued to refuse her medications stating that she would not take them on an empty stomach. She also mentioned a worsening of her cough that started yesterday but is not having a sputum production. She  denies any rhinorrhea, nausea, vomiting, diarrhea, or constipation.  Objective: Vital signs in last 24 hours: Filed Vitals:   03/16/15 1230 03/16/15 1245 03/16/15 1300 03/16/15 1315  BP:  123/90    Pulse: 108 98 94 96  Temp:      TempSrc:      Resp: 27 21 10 12   Height:      Weight:      SpO2: 88% 97% 98% 96%   Weight change: -0.1 kg (-3.5 oz)  Intake/Output Summary (Last 24 hours) at 03/16/15 1327 Last data filed at 03/16/15 1210  Gross per 24 hour  Intake  500 ml  Output  50 ml  Net  450 ml   Physical Exam Physical Exam  Constitutional: She is oriented to person, place, and time.  Cachexic appearing  HENT:  Head: Normocephalic.  Mouth/Throat: Oropharynx  is clear and moist.  Eyes: Conjunctivae and EOM are normal.  Cardiovascular: Normal rate, regular rhythm and normal heart sounds.  Pulmonary/Chest: Effort normal.  Mild expiratory rhonchi heard in the left upper border  Abdominal: Soft. Bowel sounds are normal. She exhibits no distension. There is no tenderness.  Musculoskeletal: She exhibits no edema.  Neurological: She is alert and oriented to person, place, and time.     Lab Results: Blood pressure 123/90, pulse 96, temperature 97.2 F (36.2 C), temperature source Oral, resp. rate 12, height 5\' 11"  (1.803 m), weight 55.7 kg (122 lb 12.7 oz), last menstrual period 11/16/2014, SpO2 96 %.  Micro Results: Recent Results (from the past 240 hour(s))  Blood culture (routine x 2) Status: None   Collection Time: 03/10/15 4:55 PM  Result Value Ref Range Status   Specimen Description BLOOD RIGHT ANTECUBITAL  Final   Special Requests IN PEDIATRIC BOTTLE 4CC  Final   Culture NO GROWTH 5 DAYS  Final   Report Status 03/15/2015 FINAL  Final  Blood culture (routine x 2) Status: None   Collection Time: 03/10/15 6:04 PM  Result Value Ref Range Status   Specimen  Description BLOOD RIGHT HAND  Final   Special Requests IN PEDIATRIC BOTTLE 4CC  Final   Culture NO GROWTH 5 DAYS  Final   Report Status 03/15/2015 FINAL  Final  Surgical pcr screen Status: None   Collection Time: 03/16/15 4:51 AM  Result Value Ref Range Status   MRSA, PCR NEGATIVE NEGATIVE Final   Staphylococcus aureus NEGATIVE NEGATIVE Final    Comment:   The Xpert SA Assay (FDA approved for NASAL specimens in patients over 64 years of age), is one component of a comprehensive surveillance program. Test performance has been validated by [facility] for patients greater than or equal to 53 year old. It is not intended to diagnose infection nor to guide or monitor treatment.    Studies/Results:  Imaging Results (Last 48 hours)    Dg Chest Port 1 View  03/16/2015 CLINICAL DATA: Dialysis catheter insertion EXAM: CHEST 1 VIEW ; intraoperative fluoroscopy COMPARISON: March 10, 2015 FLUOROSCOPY TIME: 0 minutes 11 second; no submitted image. FINDINGS: Dual-lumen central catheter is present. The distal tip is just beyond the cavoatrial junction in the right atrium. There is no demonstrable pneumothorax. There is moderate interstitial edema in the bases with small pleural effusions bilaterally. Heart size and contour within normal limits. There is layering effusion on the right. No adenopathy evident. IMPRESSION: Central catheter present without pneumothorax. The distal tip is in the right atrium slightly beyond the cavoatrial junction. There are pleural effusions bilaterally, slightly larger on the right than on the left with bibasilar atelectasis. No change in cardiac silhouette. Electronically Signed By: Lowella Grip III M.D. On: 03/16/2015 13:09   Dg Fluoro Guide Cv Line-no Report  03/16/2015 CLINICAL DATA: FLOURO GUIDE CV LINE Fluoroscopy was utilized by the requesting physician. No radiographic interpretation.      Scheduled Meds: . [MAR Hold] sodium chloride  Intravenous Once  . [MAR Hold] sodium chloride  Intravenous Once  . [MAR Hold] azithromycin 1,200 mg Oral Weekly  . [MAR Hold] darbepoetin (ARANESP) injection - DIALYSIS 100 mcg Intravenous Q Thu-HD  . [MAR Hold] darunavir 800 mg Oral Q breakfast  . [MAR Hold] dolutegravir 50 mg Oral BID  . [MAR Hold] doxercalciferol 2 mcg Intravenous Q T,Th,Sa-HD  . [MAR Hold] feeding supplement 1 Container Oral TID BM  . [MAR Hold] fluconazole 100 mg Oral Daily  . [  MAR Hold] heparin subcutaneous 5,000 Units Subcutaneous 3 times per day  . [MAR Hold] multivitamin 1 tablet Oral QHS  . [MAR Hold] ritonavir 100 mg Oral Q breakfast  . [MAR Hold] sodium chloride flush 3 mL Intravenous Q12H  . [MAR Hold] sulfamethoxazole-trimethoprim 1 tablet Oral Once per day on Mon Wed Fri  . [MAR Hold] tenofovir 300 mg Oral Weekly  . [MAR Hold] valACYclovir 500 mg Oral Daily  . [MAR Hold] zidovudine 300 mg Oral Q breakfast   Continuous Infusions: . sodium chloride 10 mL/hr at 03/16/15 0903   PRN Meds:.[MAR Hold] sodium chloride, [MAR Hold] sodium chloride, [MAR Hold] acetaminophen **OR** [MAR Hold] acetaminophen, [MAR Hold] alteplase, [MAR Hold] heparin, HYDROmorphone (DILAUDID) injection, [MAR Hold] lidocaine (PF), [MAR Hold] lidocaine-prilocaine, meperidine (DEMEROL) injection, [MAR Hold] ondansetron **OR** [MAR Hold] ondansetron (ZOFRAN) IV, ondansetron (ZOFRAN) IV, oxyCODONE, [MAR Hold] pentafluoroprop-tetrafluoroeth Assessment/Plan: Principal Problem:  Acute on chronic renal failure (HCC) Active Problems:  Anemia of chronic disease  Hypomagnesemia  HSV (herpes simplex virus) anogenital infection  Chronic cough  Diarrhea  Vitamin D deficiency  Secondary hyperparathyroidism of renal origin ([place])  AIDS (acquired immune deficiency syndrome) ([place])   Uremia  FSGS (focal segmental glomerulosclerosis)  Malnutrition of moderate degree  Dialysis patient [facility])   Paula Becker is a 40 yo female with a history of medical non-adherence to HAART therapy , AIDS w/ (CD4 of 78) with recent CAP in January who presents with 3-7 days of n/v/d found to have acute on chronic renal failure and symptomatic anemia requiring emergent HD. She recently received pRBC transfusions to address her anemia likely due to her chronic disease/ESRD. Today she underwent, AV graft placement without complications, and has been setup for outpatient HD when she is discharged at [facility]. Discharge delayed at this time 2/2 to pending PT evaluation by Nephrology to assess if patient can ambulate to her HD chair and manage ADL/IADL or if she requires additional assistance/rehab.  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  She will get dialysis at the [facility] T/Th/Sat -D/C pending PT evaluation placed by Nephrology today to see where she will be sent based on mobility and ability to take care of herself. Given her weakness throughout hospitalization I anticipate that she may need some home health assistance.  -Continue aranesp, hectorol, MV  AIDS: Last CD4 count 40 in January 2017 with HIV RNA level of 20,700. Patient is currently on salvage therapy with Tivicay 50 mg BID, Darunavir 800 mg daily, ritonavir 100 mg daily, Zidovudine 300 mg daily and Tenofovir 300 mg weekly; however, patient has been refusing her antiviral medications, despite nursing crushing her medications to make it easier for her to take.   -ID consulted, and stated that when she is going home that she may need to consider blister pack or education with pharmacy with calendar and pill box since there is a concern about her literacy and comprehension of the medication schedule. We see what  medications she is wiling to take. -Bactrim 3 times weekly, renally dosed -Azithromycin Qweek  -Fluconazole 100 mg daily -Tivicay 50 mg BID -Darunavir 800 mg daily -Ritonavir 100 mg daily -Zidovudine 300 mg daily -Tenofovir 300 mg weekly  HSV: Patient has recurrent HSV skin lesions notably in her gluteal cleft. She recently mentioned it was the "blue pill" that she does not like to take which is likely Valtrex and ID  recommends completing the course. We have discontinued her Valtrex at this time as a compromise for her to take her HAART and OI prophylaxis meds.  Normocytic Anemia: Likely secondary to CKD. Most recent Hgb to 9.7 after pRBC transfusions. -Last CBC was 80.1 within normocytic range. 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 Patient does not want eat the hospital food but has no LOA and is willing to eat outside food. -Pt on regular diet at this time -Boost Breeze TID -Nutrition following  Vitamin D Deficiency: 25-OH Vitamin D level <4.0 on admission.  -Doxercalciferol with HD to prevent secondary hyperPTH  DVT/PE ppx: Heparin SQ TID  Dispo: Disposition is deferred at this time, awaiting improvement of current medical problems. Anticipated discharge in approximately 2-3 day(s).  Bethena Roys from inpatient hemodialysis has confirmed the CLIP process has been setup for the patient to receive outpatient hemodialysis on a T/Th/Sat schedule at [place].  The patient does have a current PCP Campbell Riches, MD) and does not need an [facility] hospital follow-up appointment after discharge.  The patient does have transportation limitations that hinder transportation to clinic appointments.        This is a Careers information officer Note.  The care of the patient was discussed and the assessment and plan formulated with their assistance.  Please see their attached note for official documentation  of the daily encounter.   LOS: 7 days   Delena Serve., Med Student 03/17/2015, 2:48 PM

## 2015-03-17 NOTE — Progress Notes (Signed)
Subjective:  Paula Becker. Patient tolerated placement of AVG to LUE.  She is still sore there and overall fatigued.   Objective: Vital signs in last 24 hours: Filed Vitals:   03/16/15 1315 03/16/15 2107 03/17/15 0500 03/17/15 0605  BP:  146/96  130/86  Pulse: 96 119  96  Temp: 97.1 F (36.2 C) 99.1 F (37.3 C)  98.3 F (36.8 C)  TempSrc:  Oral  Oral  Resp: 12 18  18   Height:      Weight:   123 lb 7.3 oz (56 kg)   SpO2: 96% 99%     Weight change: 0 lb (0 kg)  Intake/Output Summary (Last 24 hours) at 03/17/15 0905 Last data filed at 03/16/15 1210  Gross per 24 hour  Intake    500 ml  Output     50 ml  Net    450 ml    Physical Exam  Constitutional: She is oriented to person, place, and time.  Thin, frail appearing female, lying in bed.  NAD  HENT:  Head: Normocephalic and atraumatic.  Eyes: EOM are normal. No scleral icterus.  Neck: No tracheal deviation present.  Cardiovascular: Normal rate, regular rhythm and normal heart sounds.   Pulmonary/Chest: No stridor.  Patient unwilling to sit up or roll over for examination of posterior lung fields.  Lungs CTAB in anterior fields. Poor inspiratory effort.  Abdominal: Soft. She exhibits no distension. There is no tenderness. There is no rebound and no guarding.  Musculoskeletal:  1+ edema of LUE.  No LE edema.  Neurological: She is alert and oriented to person, place, and time.  Skin: Skin is warm and dry.       Lab Results: Basic Metabolic Panel:  Recent Labs Lab 03/11/15 0540  03/14/15 0500 03/15/15 0750  NA 136  < > 137 139  K 3.2*  < > 4.4 3.8  CL 101  < > 105 105  CO2 23  < > 22 23  GLUCOSE 85  < > 99 83  BUN 34*  < > 16 23*  CREATININE 6.51*  < > 4.24* 5.68*  CALCIUM 6.5*  < > 6.7* 7.1*  MG 1.5*  --   --   --   PHOS 5.6*  < > 4.6 5.8*  < > = values in this interval not displayed. Liver Function Tests:  Recent Labs Lab 03/10/15 1432  03/12/15 0644  03/14/15 0500 03/15/15 0750  AST 14*  --  17   --   --   --   ALT 8*  --  8*  --   --   --   ALKPHOS 112  --  113  --   --   --   BILITOT 0.4  --  0.3  --   --   --   PROT 6.6  --  6.3*  --   --   --   ALBUMIN <1.0*  < > <1.0*  < > <1.0* <1.0*  < > = values in this interval not displayed.  Recent Labs Lab 03/10/15 1432  LIPASE 67*   CBC:  Recent Labs Lab 03/11/15 0540 03/12/15 0644  WBC 5.4 4.4  HGB 9.3* 9.7*  HCT 26.0* 28.2*  MCV 77.4* 80.1  PLT 227 232   Coagulation:  Recent Labs Lab 03/11/15 0540  LABPROT 17.0*  INR 1.37   Anemia Panel:  Recent Labs Lab 03/11/15 0540  FERRITIN 724*  TIBC NOT CALCULATED  IRON 38   Micro  Results: Recent Results (from the past 240 hour(s))  Blood culture (routine x 2)     Status: None   Collection Time: 03/10/15  4:55 PM  Result Value Ref Range Status   Specimen Description BLOOD RIGHT ANTECUBITAL  Final   Special Requests IN PEDIATRIC BOTTLE 4CC  Final   Culture NO GROWTH 5 DAYS  Final   Report Status 03/15/2015 FINAL  Final  Blood culture (routine x 2)     Status: None   Collection Time: 03/10/15  6:04 PM  Result Value Ref Range Status   Specimen Description BLOOD RIGHT HAND  Final   Special Requests IN PEDIATRIC BOTTLE 4CC  Final   Culture NO GROWTH 5 DAYS  Final   Report Status 03/15/2015 FINAL  Final  Surgical pcr screen     Status: None   Collection Time: 03/16/15  4:51 AM  Result Value Ref Range Status   MRSA, PCR NEGATIVE NEGATIVE Final   Staphylococcus aureus NEGATIVE NEGATIVE Final    Comment:        The Xpert SA Assay (FDA approved for NASAL specimens in patients over 74 years of age), is one component of a comprehensive surveillance program.  Test performance has been validated by [facility] for patients greater than or equal to 5 year old. It is not intended to diagnose infection nor to guide or monitor treatment.    Medications:  I have reviewed the patient's current medications. Prior to Admission:  Prescriptions prior to admission    Medication Sig Dispense Refill Last Dose  . alum & mag hydroxide-simeth (MAALOX/MYLANTA) 200-200-20 MG/5ML suspension Take 30 mLs by mouth every 6 (six) hours as needed for indigestion, heartburn or flatulence. (Patient not taking: Reported on 03/10/2015) 355 mL 0 Not Taking at Unknown time  . azithromycin (ZITHROMAX) 600 MG tablet Take 2 tablets (1200 mg) by mouth every Tuesday for 11 more weeks 24 tablet 0 1-2 weeks  . fluconazole (DIFLUCAN) 100 MG tablet Take 100 mg by mouth daily.  0 1-2 weeks  . gabapentin (NEURONTIN) 300 MG capsule TAKE 1 CAPSULE BY MOUTH THREE TIMES DAILY (Patient not taking: Reported on 01/18/2015) 60 capsule 2 Not Taking at Unknown time  . hydrOXYzine (ATARAX/VISTARIL) 25 MG tablet Take 1 tablet (25 mg total) by mouth every 6 (six) hours. (Patient not taking: Reported on 01/18/2015) 20 tablet 0 Not Taking at Unknown time  . sulfamethoxazole-trimethoprim (BACTRIM,SEPTRA) 400-80 MG tablet Take 1 tablet by mouth 3 (three) times a week. For 3 weeks  0 1-2 weeks  . valACYclovir (VALTREX) 500 MG tablet Take 1 tablet (500 mg total) by mouth daily. 21 tablet 0 1-2 weeks   Scheduled Meds: . sodium chloride   Intravenous Once  . sodium chloride   Intravenous Once  . azithromycin  1,200 mg Oral Weekly  . darbepoetin (ARANESP) injection - DIALYSIS  100 mcg Intravenous Q Thu-HD  . darunavir  800 mg Oral Q breakfast  . dolutegravir  50 mg Oral BID  . doxercalciferol  2 mcg Intravenous Q T,Th,Sa-HD  . feeding supplement  1 Container Oral TID BM  . fluconazole  100 mg Oral Daily  . heparin subcutaneous  5,000 Units Subcutaneous 3 times per day  . multivitamin  1 tablet Oral QHS  . ritonavir  100 mg Oral Q breakfast  . sodium chloride flush  3 mL Intravenous Q12H  . sulfamethoxazole-trimethoprim  1 tablet Oral Once per day on Mon Wed Fri  . tenofovir  300 mg Oral Weekly  .  zidovudine  300 mg Oral Q breakfast   Continuous Infusions: . sodium chloride 10 mL/hr at 03/16/15 0903    PRN Meds:.sodium chloride, sodium chloride, acetaminophen **OR** acetaminophen, alteplase, heparin, lidocaine (PF), lidocaine-prilocaine, ondansetron **OR** ondansetron (ZOFRAN) IV, oxyCODONE, pentafluoroprop-tetrafluoroeth Assessment/Plan: Principal Problem:   Other depression due to general medical condition Active Problems:   Anemia of chronic disease   Hypomagnesemia   HSV (herpes simplex virus) anogenital infection   Chronic cough   Diarrhea   Acute on chronic renal failure (HCC)   Vitamin D deficiency   Secondary hyperparathyroidism of renal origin ([place])   AIDS (acquired immune deficiency syndrome) ([place])   Uremia   FSGS (focal segmental glomerulosclerosis)   Malnutrition of moderate degree   Dialysis patient ([place])   CKD (chronic kidney disease) stage V requiring chronic dialysis ([place])  Ms. Santa is a 40 year old woman with poorly controlled AIDS complicated by FSGS and HSV who presents with a four-day history of nausea, vomiting, diarrhea, and abdominal pain, found to have acute on chronic renal failure and symptomatic anaemia requiring emergent HD.   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 She also has tunneled RIJ catheter placement.  She has been clipped for outpatient dialysis. - Nephrology and VVS following and appreciate recs - Continue aranesp, hectorol, MV - TTS dialysis. - PT and Case Management consults for arranging outpatient dialysis follow up.  HIV/AIDS: CD4 count is 40 2/2 medication nonadherence, as patient refused to take most of her pills.  Patient appears to have decision making capacity.  Her HIV medications are all important. However, patient likely to refuse taking them after discharge.   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  - Regular diet (not renal) - Boost Breeze TID - Nutrition following  DVT/PE ppx: Heparin SQ TID  Dispo: Disposition is deferred at this time, awaiting improvement of current medical problems.  Anticipated discharge in approximately 3-4 day(s).   The patient does have a current PCP Campbell Riches, MD) and does not need an [facility] hospital follow-up appointment after discharge.  The patient does have transportation limitations that hinder transportation to clinic appointments.   LOS: 7 days   Signed Viviano Simas, MD, PhD 03/17/2015 11:35 AM

## 2015-03-17 NOTE — Progress Notes (Signed)
Internal Medicine Attending  Date: 03/17/2015  Patient name: Paula Becker Medical record number: [id number] Date of birth: [birth year] Age: 40 y.o. Gender: female  I saw and evaluated the patient. I reviewed the resident's note by Dr. Lovena Le and I agree with the resident's findings and plans as documented in his progress note.  There are some last minute issues that nephrology would like to have tied up prior to [name] discharging her home. Cooperation and engagement in her own self-care likely will continue to be a challenge. We will assess her tolerance of dialysis in a chair tomorrow morning. We will also clarify what specifically she is willing to do for herself and what she is not interested in so we have a clearer view and understanding of her goals of care.

## 2015-03-17 NOTE — Progress Notes (Signed)
Assessment: 1. New ESRD sec HIV 2. Anemia Sp transfusion. On aranesp 3. HIV 4. Sec HPTH PTH 405 02/08/15, on hectorol 5 S/p BVT 2/14 Dr. Scot Dock and [facility]  PLAN: 1. Will do HD tomorrow in a chair. I am not sure she will be able to transfer in a HD unit. 2.  Need to make sure she can sit in dialysis chair and she has essentials for transportation prior to discharge 3  ?Rehab stay, need PT consult for safety  Subjective: Interval History: Has not been out of bed  Objective: Vital signs in last 24 hours: Temp:  [97.1 F (36.2 C)-99.1 F (37.3 C)] 98.3 F (36.8 C) (02/15 0605) Pulse Rate:  [94-119] 96 (02/15 0605) Resp:  [10-21] 18 (02/15 0605) BP: (123-146)/(86-96) 130/86 mmHg (02/15 0605) SpO2:  [96 %-99 %] 99 % (02/14 2107) Weight:  [56 kg (123 lb 7.3 oz)] 56 kg (123 lb 7.3 oz) (02/15 0500) Weight change: 0 kg (0 lb)  Intake/Output from previous day: 02/14 0701 - 02/15 0700 In: 500 [I.V.:500] Out: 50 [Blood:50] Intake/Output this shift:    General appearance: alert and depressed and flat affect Extremities: extremities normal, atraumatic, no cyanosis or edema  Lab Results: No results for input(s): WBC, HGB, HCT, PLT in the last 72 hours. BMET:  Recent Labs  03/15/15 0750  NA 139  K 3.8  CL 105  CO2 23  GLUCOSE 83  BUN 23*  CREATININE 5.68*  CALCIUM 7.1*   No results for input(s): PTH in the last 72 hours. Iron Studies: No results for input(s): IRON, TIBC, TRANSFERRIN, FERRITIN in the last 72 hours. Studies/Results: Dg Chest Port 1 View  03/16/2015  CLINICAL DATA:  Dialysis catheter insertion EXAM: CHEST 1 VIEW ; intraoperative fluoroscopy COMPARISON:  March 10, 2015 FLUOROSCOPY TIME:  0 minutes 11 second; no submitted image. FINDINGS: Dual-lumen central catheter is present. The distal tip is just beyond the cavoatrial junction in the right atrium. There is no demonstrable pneumothorax. There is moderate interstitial edema in the bases with small pleural  effusions bilaterally. Heart size and contour within normal limits. There is layering effusion on the right. No adenopathy evident. IMPRESSION: Central catheter present without pneumothorax. The distal tip is in the right atrium slightly beyond the cavoatrial junction. There are pleural effusions bilaterally, slightly larger on the right than on the left with bibasilar atelectasis. No change in cardiac silhouette. Electronically Signed   By: Lowella Grip III M.D.   On: 03/16/2015 13:09   Dg Fluoro Guide Cv Line-no Report  03/16/2015  CLINICAL DATA:  FLOURO GUIDE CV LINE Fluoroscopy was utilized by the requesting physician.  No radiographic interpretation.    Scheduled: . sodium chloride   Intravenous Once  . sodium chloride   Intravenous Once  . azithromycin  1,200 mg Oral Weekly  . darbepoetin (ARANESP) injection - DIALYSIS  100 mcg Intravenous Q Thu-HD  . darunavir  800 mg Oral Q breakfast  . dolutegravir  50 mg Oral BID  . doxercalciferol  2 mcg Intravenous Q T,Th,Sa-HD  . feeding supplement  1 Container Oral TID BM  . fluconazole  100 mg Oral Daily  . heparin subcutaneous  5,000 Units Subcutaneous 3 times per day  . multivitamin  1 tablet Oral QHS  . ritonavir  100 mg Oral Q breakfast  . sodium chloride flush  3 mL Intravenous Q12H  . sulfamethoxazole-trimethoprim  1 tablet Oral Once per day on Mon Wed Fri  . tenofovir  300 mg Oral  Weekly  . zidovudine  300 mg Oral Q breakfast    LOS: 7 days   Reese Stockman C 03/17/2015,12:31 PM

## 2015-03-17 NOTE — Progress Notes (Signed)
   VASCULAR SURGERY ASSESSMENT & PLAN:  * 1 Day Post-Op s/p: Left basilic vein transposition and placement of tunneled dialysis catheter.  *  I have arranged follow up in 6 weeks to check on the maturation of her fistula.  * Vascular surgery will be available as needed.  SUBJECTIVE: No complaints.  PHYSICAL EXAM: Filed Vitals:   03/16/15 1315 03/16/15 2107 03/17/15 0500 03/17/15 0605  BP:  146/96  130/86  Pulse: 96 119  96  Temp: 97.1 F (36.2 C) 99.1 F (37.3 C)  98.3 F (36.8 C)  TempSrc:  Oral  Oral  Resp: 12 18  18   Height:      Weight:   123 lb 7.3 oz (56 kg)   SpO2: 96% 99%     Good thrill and left basilic vein transposition.  LABS: Lab Results  Component Value Date   WBC 4.4 03/12/2015   HGB 9.7* 03/12/2015   HCT 28.2* 03/12/2015   MCV 80.1 03/12/2015   PLT 232 03/12/2015   Lab Results  Component Value Date   CREATININE 5.68* 03/15/2015   Lab Results  Component Value Date   INR 1.37 03/11/2015   CBG (last 3)  No results for input(s): GLUCAP in the last 72 hours.  Principal Problem:   Other depression due to general medical condition Active Problems:   Anemia of chronic disease   Hypomagnesemia   HSV (herpes simplex virus) anogenital infection   Chronic cough   Diarrhea   Acute on chronic renal failure (HCC)   Vitamin D deficiency   Secondary hyperparathyroidism of renal origin ([place])   AIDS (acquired immune deficiency syndrome) (HCC)   Uremia   FSGS (focal segmental glomerulosclerosis)   Malnutrition of moderate degree   Dialysis patient ([place])   CKD (chronic kidney disease) stage V requiring chronic dialysis [facility])    Gae Gallop [id number] 03/17/2015

## 2015-03-17 NOTE — Progress Notes (Signed)
Nutrition Follow-up  DOCUMENTATION CODES:   Non-severe (moderate) malnutrition in context of chronic illness, Underweight  INTERVENTION:   -Continue Boost Breeze po TID, each supplement provides 250 kcal and 9 grams of protein -Continue liberalized diet of regular  NUTRITION DIAGNOSIS:   Malnutrition related to chronic illness as evidenced by mild depletion of body fat, mild depletion of muscle mass.  Ongoing  GOAL:   Patient will meet greater than or equal to 90% of their needs  Unmet  MONITOR:   PO intake, Supplement acceptance, Labs, Weight trends, Skin, I & O's  REASON FOR ASSESSMENT:   Consult Assessment of nutrition requirement/status  ASSESSMENT:   Paula Becker is a 40 year old woman with poorly controlled AIDS complicated by FSGS and HSV who presents with a four-day history of nausea, vomiting, diarrhea, and abdominal pain. In addition, she has a several week history of worsening appetite. She was found in acute on chronic renal failure and uremia requiring emergent dialysis. After the first session her symptoms were improved.  Pt s/p left arm AV graft and tunneled HD cath on 03/16/15. She will require permanent HD and has outpatient arrangements.   Pt with very poor po intake. Noted 0-10%. Per staff report, pt does not like the food here and has family members providing outside food. She is refusing Boost Breeze supplements. Her diet has been liberalized to regular, which this RD agrees with, given pt's malnutrition and past hx of poor po intake.   Labs reviewed: Phos: 5.8.   Diet Order:  Diet regular Room service appropriate?: Yes; Fluid consistency:: Thin Diet - low sodium heart healthy  Skin:  Reviewed, no issues  Last BM:  03/17/15  Height:   Ht Readings from Last 1 Encounters:  03/11/15 5\' 11"  (1.803 m)    Weight:   Wt Readings from Last 1 Encounters:  03/17/15 123 lb 7.3 oz (56 kg)    Ideal Body Weight:  70.5 kg  BMI:  Body mass index is 17.23  kg/(m^2).  Estimated Nutritional Needs:   Kcal:  1700-1900  Protein:  85-100 grams  Fluid:  per MD  EDUCATION NEEDS:   Education needs addressed  Paula Becker A. Jimmye Norman, RD, LDN, CDE Pager: [phone] After hours Pager: [phone]

## 2015-03-18 ENCOUNTER — Other Ambulatory Visit: Payer: Self-pay

## 2015-03-18 ENCOUNTER — Telehealth: Payer: Self-pay | Admitting: Vascular Surgery

## 2015-03-18 LAB — RENAL FUNCTION PANEL
Albumin: <1 g/dL — ABNORMAL LOW (ref 3.5–5.0)
Anion gap: 11 (ref 5–15)
BUN: 33 mg/dL — ABNORMAL HIGH (ref 6–20)
CHLORIDE: 108 mmol/L (ref 101–111)
CO2: 21 mmol/L — AB (ref 22–32)
Calcium: 7.5 mg/dL — ABNORMAL LOW (ref 8.9–10.3)
Creatinine, Ser: 6.39 mg/dL — ABNORMAL HIGH (ref 0.44–1.00)
GFR, EST AFRICAN AMERICAN: 9 mL/min — AB (ref >60)
GFR, EST NON AFRICAN AMERICAN: 7 mL/min — AB (ref >60)
Glucose, Bld: 90 mg/dL (ref 65–99)
POTASSIUM: 3.9 mmol/L (ref 3.5–5.1)
Phosphorus: 7.5 mg/dL — ABNORMAL HIGH (ref 2.5–4.6)
Sodium: 140 mmol/L (ref 135–145)

## 2015-03-18 LAB — CBC
HEMATOCRIT: 25.1 % — AB (ref 36.0–46.0)
Hemoglobin: 8.2 g/dL — ABNORMAL LOW (ref 12.0–15.0)
MCH: 27.9 pg (ref 26.0–34.0)
MCHC: 32.7 g/dL (ref 30.0–36.0)
MCV: 85.4 fL (ref 78.0–100.0)
Platelets: 129 10*3/uL — ABNORMAL LOW (ref 150–400)
RBC: 2.94 MIL/uL — AB (ref 3.87–5.11)
RDW: 19 % — ABNORMAL HIGH (ref 11.5–15.5)
WBC: 7.9 10*3/uL (ref 4.0–10.5)

## 2015-03-18 MED ORDER — PENTAFLUOROPROP-TETRAFLUOROETH EX AERO: 1.0000 "application " | INHALATION_SPRAY | CUTANEOUS | Status: DC | PRN

## 2015-03-18 MED ORDER — SODIUM CHLORIDE 0.9 % IV SOLN: 100.0000 mL | INTRAVENOUS | Status: DC | PRN

## 2015-03-18 MED ORDER — DOXERCALCIFEROL 4 MCG/2ML IV SOLN
INTRAVENOUS | Status: AC
Start: 2015-03-18 — End: 2015-03-18
  Filled 2015-03-18: qty 2

## 2015-03-18 MED ORDER — ALTEPLASE 2 MG IJ SOLR: 2.0000 mg | Freq: Once | INTRAMUSCULAR | Status: DC | PRN

## 2015-03-18 MED ORDER — ALTEPLASE 2 MG IJ SOLR
2.0000 mg | Freq: Once | INTRAMUSCULAR | Status: AC
  Administered 2015-03-18: 2 mg
  Filled 2015-03-18: qty 2

## 2015-03-18 MED ORDER — DARBEPOETIN ALFA 100 MCG/0.5ML IJ SOSY
PREFILLED_SYRINGE | INTRAMUSCULAR | Status: AC
  Administered 2015-03-18: 100 ug via INTRAVENOUS
  Filled 2015-03-18: qty 0.5

## 2015-03-18 MED ORDER — SULFAMETHOXAZOLE-TRIMETHOPRIM 400-80 MG PO TABS: 1.0000 | ORAL_TABLET | ORAL | Status: DC

## 2015-03-18 MED ORDER — LIDOCAINE-PRILOCAINE 2.5-2.5 % EX CREA: 1.0000 "application " | TOPICAL_CREAM | CUTANEOUS | Status: DC | PRN

## 2015-03-18 MED ORDER — LIDOCAINE HCL (PF) 1 % IJ SOLN: 5.0000 mL | INTRAMUSCULAR | Status: DC | PRN

## 2015-03-18 MED ORDER — HEPARIN SODIUM (PORCINE) 1000 UNIT/ML DIALYSIS: 20.0000 [IU]/kg | INTRAMUSCULAR | Status: DC | PRN

## 2015-03-18 MED ORDER — HEPARIN SODIUM (PORCINE) 1000 UNIT/ML DIALYSIS: 1000.0000 [IU] | INTRAMUSCULAR | Status: DC | PRN

## 2015-03-18 NOTE — Consult Note (Signed)
   [facility] CM Inpatient Consult   03/18/2015  Paula Becker 1975-02-25 [id number] Referral received from inpatient [facility] for multiple admissions and medical adherence.  Patient evaluated for community based chronic disease management services with [place] [facility] as a benefit of patient's [employer]. Spoke with patient and her father at bedside to explain [place] [facility] services for medication management and care management needs. Consent form signed.  Patient will receive post hospital discharge call and will be evaluated for monthly home visits for assessments and disease process education.  Left contact information and [facility] literature at bedside. Made Inpatient Case Manager aware that [place] [facility] following. Of note, [facility] Care Management services does not replace or interfere with any services that are arranged by inpatient case management or social work.  For additional questions or referrals please contact:   Natividad Brood, RN BSN [facility] Liaison  [phone] business mobile phone Toll free office [phone]

## 2015-03-18 NOTE — Progress Notes (Signed)
Subjective: Patient was not present in room, seen in hemodialysis. No acute complaints otherwise. Objective: Vital signs in last 24 hours: Filed Vitals:   03/18/15 0737 03/18/15 0741 03/18/15 0753 03/18/15 0829  BP: 121/81 134/89 124/83 111/78  Pulse: 87 85 85 104  Temp: 97.7 F (36.5 C)     TempSrc: Oral     Resp: 24 20 21 25   Height:      Weight: 55.4 kg (122 lb 2.2 oz)     SpO2: 96%      Weight change: -0.3 kg (-10.6 oz)  Intake/Output Summary (Last 24 hours) at 03/18/15 0847 Last data filed at 03/18/15 [id number]  Gross per 24 hour  Intake    300 ml  Output      0 ml  Net    300 ml   Physical Exam  Constitutional: She is oriented to person, place, and time.  Eyes: EOM are normal.  Neck: Normal range of motion.  Cardiovascular: Normal rate, regular rhythm and normal heart sounds.   Pulmonary/Chest: Effort normal.  Musculoskeletal: She exhibits no edema.  Neurological: She is alert and oriented to person, place, and time.  Skin: Skin is warm and dry.   Lab Results: No recent labs. RFP pending, order by nephrology. Micro Results: Recent Results (from the past 240 hour(s))  Blood culture (routine x 2)     Status: None   Collection Time: 03/10/15  4:55 PM  Result Value Ref Range Status   Specimen Description BLOOD RIGHT ANTECUBITAL  Final   Special Requests IN PEDIATRIC BOTTLE 4CC  Final   Culture NO GROWTH 5 DAYS  Final   Report Status 03/15/2015 FINAL  Final  Blood culture (routine x 2)     Status: None   Collection Time: 03/10/15  6:04 PM  Result Value Ref Range Status   Specimen Description BLOOD RIGHT HAND  Final   Special Requests IN PEDIATRIC BOTTLE 4CC  Final   Culture NO GROWTH 5 DAYS  Final   Report Status 03/15/2015 FINAL  Final  Surgical pcr screen     Status: None   Collection Time: 03/16/15  4:51 AM  Result Value Ref Range Status   MRSA, PCR NEGATIVE NEGATIVE Final   Staphylococcus aureus NEGATIVE NEGATIVE Final    Comment:        The Xpert SA Assay  (FDA approved for NASAL specimens in patients over 41 years of age), is one component of a comprehensive surveillance program.  Test performance has been validated by [facility] for patients greater than or equal to 61 year old. It is not intended to diagnose infection nor to guide or monitor treatment.    Studies/Results: Dg Chest Port 1 View  03/16/2015  CLINICAL DATA:  Dialysis catheter insertion EXAM: CHEST 1 VIEW ; intraoperative fluoroscopy COMPARISON:  March 10, 2015 FLUOROSCOPY TIME:  0 minutes 11 second; no submitted image. FINDINGS: Dual-lumen central catheter is present. The distal tip is just beyond the cavoatrial junction in the right atrium. There is no demonstrable pneumothorax. There is moderate interstitial edema in the bases with small pleural effusions bilaterally. Heart size and contour within normal limits. There is layering effusion on the right. No adenopathy evident. IMPRESSION: Central catheter present without pneumothorax. The distal tip is in the right atrium slightly beyond the cavoatrial junction. There are pleural effusions bilaterally, slightly larger on the right than on the left with bibasilar atelectasis. No change in cardiac silhouette. Electronically Signed   By: Lowella Grip III  M.D.   On: 03/16/2015 13:09   Dg Cyndy Freeze Guide Cv Line-no Report  03/16/2015  CLINICAL DATA:  FLOURO GUIDE CV LINE Fluoroscopy was utilized by the requesting physician.  No radiographic interpretation.    Scheduled Meds: . sodium chloride   Intravenous Once  . azithromycin  1,200 mg Oral Weekly  . darbepoetin (ARANESP) injection - DIALYSIS  100 mcg Intravenous Q Thu-HD  . darunavir  800 mg Oral Q breakfast  . dolutegravir  50 mg Oral BID  . doxercalciferol  2 mcg Intravenous Q T,Th,Sa-HD  . feeding supplement  1 Container Oral TID BM  . fluconazole  100 mg Oral Daily  . heparin subcutaneous  5,000 Units Subcutaneous 3 times per day  . mirtazapine  15 mg Oral QHS  .  multivitamin  1 tablet Oral QHS  . ritonavir  100 mg Oral Q breakfast  . sodium chloride flush  3 mL Intravenous Q12H  . sulfamethoxazole-trimethoprim  1 tablet Oral Once per day on Mon Wed Fri  . tenofovir  300 mg Oral Weekly  . zidovudine  300 mg Oral Q breakfast   Continuous Infusions: . sodium chloride 10 mL/hr at 03/16/15 0903   PRN Meds:.sodium chloride, sodium chloride, acetaminophen **OR** acetaminophen, alteplase, heparin, heparin, lidocaine (PF), lidocaine-prilocaine, ondansetron **OR** ondansetron (ZOFRAN) IV, oxyCODONE, pentafluoroprop-tetrafluoroeth Assessment/Plan: Principal Problem:   Other depression due to general medical condition Active Problems:   Anemia of chronic disease   Hypomagnesemia   HSV (herpes simplex virus) anogenital infection   Chronic cough   Diarrhea   Acute on chronic renal failure (HCC)   Vitamin D deficiency   Secondary hyperparathyroidism of renal origin ([place])   AIDS (acquired immune deficiency syndrome) ([place])   Uremia   FSGS (focal segmental glomerulosclerosis)   Malnutrition of moderate degree   Dialysis patient ([place])   CKD (chronic kidney disease) stage V requiring chronic dialysis ([place])  Paula Becker is a 40 yo female with a history of medical non-adherence to HAART therapy , AIDS w/ (CD4 of 45) with recent CAP in January who presents with 3-7 days of n/v/d found to have acute on chronic renal failure and symptomatic anemia requiring emergent HD. She received pRBC transfusions earlier in her hospitalizations  to address her anemia likely due to her chronic disease/ESRD. Today she received inpatient HD, and she was able to ambulate to her HD chair as well. She has been counseled on the importance of taking her HAART medications given her AIDS diagnosis. She does have a history of medication non-adherence which causes concern for possible future decline in her health status as a result of her non-adherence. She mentioned one of the  antiretroviral medications(Prezcobix) that she did not like taking, however this is no longer part of her HAART regimen so I informed her that she was no longer receiving this medication but should continue to take the rest of her medications, which she seemed willing to do.   1)Dispo -She will be receiving outpatient dialysis on a T/Th/Sat regimen. She received inpatient dialysis today and is due for Sat hemodialysis at [facility].  2)HIV Nephropathy She got an AV graft placed in left upper extremity and tunneled RIJ catheter placement on 03/16/2015.  -Nephrology and VVS are following. -Continue aranesp, Hectoral, MV -Patient is on T/Th/Sat dialysis schedule, next dialysis session will be this Sat at [facility] dialysis [facility].  3) HIV/AIDS  CD4 count most recently.Patient has been counseled on the importance of taking her antiretroviral medications  and has the capacity to make decisions about taking her medications, but I am concerned about her insight into the gravity of her illness. She has a history of medication nonadherence that may continue once she is discharged. - Bactrim 3 times weekly, renally dosed - Azithromycin Qweek  - Fluconazole 100 mg daily - Tivicay 50 mg BID - Darunavir 800 mg daily - Ritonavir 100 mg daily - Zidovudine 300 mg daily - Tenofovir 300 mg weekly  4) HSV Valtrex 7 day course was not completed and discontinued due to medication non adherence.  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 -Nutrition following  DVT/PE ppx: Heparin SQ TID       This is a Careers information officer Note.  The care of the patient was discussed and the assessment and plan formulated with their assistance.  Please see their attached note for official documentation of the daily encounter.     LOS: 8 days   Delena Serve., Med Student 03/18/2015, 8:47 AM

## 2015-03-18 NOTE — Progress Notes (Signed)
Internal Medicine Attending  Date: 03/18/2015  Patient name: Paula Becker Medical record number: [id number] Date of birth: 31-Oct-1975 Age: 40 y.o. Gender: female  I saw and evaluated the patient. I reviewed the resident's note by Dr. Lovena Le and I agree with the resident's findings and plans as documented in her progress note.  [name] tolerated hemodialysis in a chair without difficulty. She is discharged home today with follow-up in the dialysis center and [facility].

## 2015-03-18 NOTE — Progress Notes (Signed)
Cathflo removed from both lumens of Right IJ CVC, flushed with 19mL NS and Heparin 2.63mL Arterial lumen and 2.4 mL Venous lumen inserted. Patient nurse, Vincente Liberty made aware.  Patient alert and oriented and also educated. Patient acknowledged education.

## 2015-03-18 NOTE — Progress Notes (Signed)
Patient venous and arterial pressure high despite reversing lines and system change out.  Per Dr. Florene Glen, ok to dwell Cath flo overnight in CVC catheter and ok to end treatment with 30 mins remaining.  VSS and patient alert and oriented.  Will continue to monitor.

## 2015-03-18 NOTE — Progress Notes (Signed)
PT Cancellation Note  Patient Details Name: Nelline Burningham MRN: [id number] DOB: [birth year]   Cancelled Treatment:    Reason Eval/Treat Not Completed: Patient at procedure or test/unavailable   Joellen Tullos 03/18/2015, 11:28 AM Suanne Marker PT [phone]

## 2015-03-18 NOTE — Progress Notes (Signed)
Activase dwelling in both ports of CVC per Dr. Florene Glen.  Receiving nurse Jene Every notified and aware.

## 2015-03-18 NOTE — Progress Notes (Signed)
Patient discharge teaching given, including activity, diet, follow-up appoints, and medications. Patient verbalized understanding of all discharge instructions. IV access was d/c'd. Vitals are stable. Skin is intact except as charted in most recent assessments. Pt to be escorted out by NT, to be driven home by family. 

## 2015-03-18 NOTE — Telephone Encounter (Signed)
-----   Message from Mena Goes, RN sent at 03/16/2015 12:46 PM EST ----- Regarding: schedule   ----- Message -----    From: Gabriel Earing, PA-C    Sent: 03/16/2015  12:11 PM      To: Vvs Charge Pool  S/p left BVT 03/16/15.  F/u with Dr. Scot Dock in 6 weeks with duplex.  Thanks

## 2015-03-18 NOTE — Discharge Instructions (Signed)
1. Restart your HIV and other medications. It is very important that you take these medications.  Follow up with Dr. Johnnye Sima. 2. Continue dialysis.        03/16/2015 Paula Becker [id number] 1975-11-22  Surgeon(s): Angelia Mould, MD  Procedure(s): INSERTION OF DIALYSIS CATHETER RIGHT INTERNAL JUGULAR BASCILIC VEIN TRANSPOSITION LEFT UPPER ARM  x Do not stick fistula for 12 weeks

## 2015-03-18 NOTE — Procedures (Signed)
On dialysis and appears to be tolerating it well in a chair.  She was also able to sit in the chair and apparently stand to weigh and transfer OK.  She has profound hypoalbuminemia. [place] TTS 2nd shift.  Parthena Fergeson C

## 2015-03-18 NOTE — Progress Notes (Signed)
Subjective:  Paula Becker. Patient was examined sitting in dialysis chair.  She reports walking from her hospital bed to dialysis chair without issue.  Her father and son will transport her to/from dialysis.  She states the only medication she did not like taking was the Prezcobix.  She understands the importance of the rest of her medications, including HIV and OI prophylaxis.     Objective: Vital signs in last 24 hours: Filed Vitals:   03/18/15 0737 03/18/15 0741 03/18/15 0753 03/18/15 0829  BP: 121/81 134/89 124/83 111/78  Pulse: 87 85 85 104  Temp: 97.7 F (36.5 C)     TempSrc: Oral     Resp: 24 20 21 25   Height:      Weight: 122 lb 2.2 oz (55.4 kg)     SpO2: 96%      Weight change: -10.6 oz (-0.3 kg)  Intake/Output Summary (Last 24 hours) at 03/18/15 0854 Last data filed at 03/18/15 [id number]  Gross per 24 hour  Intake    300 ml  Output      0 ml  Net    300 ml    Physical Exam  Constitutional: She is oriented to person, place, and time.  Thin, frail appearing female, reclined in chair.  NAD  HENT:  Head: Normocephalic and atraumatic.  Eyes: EOM are normal. No scleral icterus.  Neck: No tracheal deviation present.  Cardiovascular: Normal rate, regular rhythm and normal heart sounds.   Pulmonary/Chest: No stridor.  Posterior superior lung fields CTAB.  Lungs CTAB in anterior fields. Poor inspiratory effort.  Abdominal: Soft. She exhibits no distension. There is no tenderness. There is no rebound and no guarding.  Musculoskeletal:  1+ edema of LUE.  No LE edema.  Neurological: She is alert and oriented to person, place, and time.  Skin: Skin is warm and dry.    Lab Results: Basic Metabolic Panel:  Recent Labs Lab 03/15/15 0750 03/18/15 0745  NA 139 140  K 3.8 3.9  CL 105 108  CO2 23 21*  GLUCOSE 83 90  BUN 23* 33*  CREATININE 5.68* 6.39*  CALCIUM 7.1* 7.5*  PHOS 5.8* 7.5*   Liver Function Tests:  Recent Labs Lab 03/12/15 0644  03/15/15 0750  03/18/15 0745  AST 17  --   --   --   ALT 8*  --   --   --   ALKPHOS 113  --   --   --   BILITOT 0.3  --   --   --   PROT 6.3*  --   --   --   ALBUMIN <1.0*  < > <1.0* PENDING  < > = values in this interval not displayed. No results for input(s): LIPASE, AMYLASE in the last 168 hours. CBC:  Recent Labs Lab 03/12/15 0644 03/18/15 0745  WBC 4.4 7.9  HGB 9.7* 8.2*  HCT 28.2* 25.1*  MCV 80.1 85.4  PLT 232 129*   Coagulation: No results for input(s): LABPROT, INR in the last 168 hours. Anemia Panel: No results for input(s): VITAMINB12, FOLATE, FERRITIN, TIBC, IRON, RETICCTPCT in the last 168 hours. Micro Results: Recent Results (from the past 240 hour(s))  Blood culture (routine x 2)     Status: None   Collection Time: 03/10/15  4:55 PM  Result Value Ref Range Status   Specimen Description BLOOD RIGHT ANTECUBITAL  Final   Special Requests IN PEDIATRIC BOTTLE 4CC  Final   Culture NO GROWTH 5 DAYS  Final  Report Status 03/15/2015 FINAL  Final  Blood culture (routine x 2)     Status: None   Collection Time: 03/10/15  6:04 PM  Result Value Ref Range Status   Specimen Description BLOOD RIGHT HAND  Final   Special Requests IN PEDIATRIC BOTTLE 4CC  Final   Culture NO GROWTH 5 DAYS  Final   Report Status 03/15/2015 FINAL  Final  Surgical pcr screen     Status: None   Collection Time: 03/16/15  4:51 AM  Result Value Ref Range Status   MRSA, PCR NEGATIVE NEGATIVE Final   Staphylococcus aureus NEGATIVE NEGATIVE Final    Comment:        The Xpert SA Assay (FDA approved for NASAL specimens in patients over 55 years of age), is one component of a comprehensive surveillance program.  Test performance has been validated by [facility] for patients greater than or equal to 11 year old. It is not intended to diagnose infection nor to guide or monitor treatment.    Medications:  I have reviewed the patient's current medications. Prior to Admission:  Prescriptions prior to  admission  Medication Sig Dispense Refill Last Dose  . alum & mag hydroxide-simeth (MAALOX/MYLANTA) 200-200-20 MG/5ML suspension Take 30 mLs by mouth every 6 (six) hours as needed for indigestion, heartburn or flatulence. (Patient not taking: Reported on 03/10/2015) 355 mL 0 Not Taking at Unknown time  . azithromycin (ZITHROMAX) 600 MG tablet Take 2 tablets (1200 mg) by mouth every Tuesday for 11 more weeks 24 tablet 0 1-2 weeks  . fluconazole (DIFLUCAN) 100 MG tablet Take 100 mg by mouth daily.  0 1-2 weeks  . gabapentin (NEURONTIN) 300 MG capsule TAKE 1 CAPSULE BY MOUTH THREE TIMES DAILY (Patient not taking: Reported on 01/18/2015) 60 capsule 2 Not Taking at Unknown time  . hydrOXYzine (ATARAX/VISTARIL) 25 MG tablet Take 1 tablet (25 mg total) by mouth every 6 (six) hours. (Patient not taking: Reported on 01/18/2015) 20 tablet 0 Not Taking at Unknown time  . sulfamethoxazole-trimethoprim (BACTRIM,SEPTRA) 400-80 MG tablet Take 1 tablet by mouth 3 (three) times a week. For 3 weeks  0 1-2 weeks  . valACYclovir (VALTREX) 500 MG tablet Take 1 tablet (500 mg total) by mouth daily. 21 tablet 0 1-2 weeks   Scheduled Meds: . sodium chloride   Intravenous Once  . azithromycin  1,200 mg Oral Weekly  . darbepoetin (ARANESP) injection - DIALYSIS  100 mcg Intravenous Q Thu-HD  . darunavir  800 mg Oral Q breakfast  . dolutegravir  50 mg Oral BID  . doxercalciferol  2 mcg Intravenous Q T,Th,Sa-HD  . feeding supplement  1 Container Oral TID BM  . fluconazole  100 mg Oral Daily  . heparin subcutaneous  5,000 Units Subcutaneous 3 times per day  . mirtazapine  15 mg Oral QHS  . multivitamin  1 tablet Oral QHS  . ritonavir  100 mg Oral Q breakfast  . sodium chloride flush  3 mL Intravenous Q12H  . sulfamethoxazole-trimethoprim  1 tablet Oral Once per day on Mon Wed Fri  . tenofovir  300 mg Oral Weekly  . zidovudine  300 mg Oral Q breakfast   Continuous Infusions: . sodium chloride 10 mL/hr at 03/16/15 0903    PRN Meds:.sodium chloride, sodium chloride, acetaminophen **OR** acetaminophen, alteplase, heparin, heparin, lidocaine (PF), lidocaine-prilocaine, ondansetron **OR** ondansetron (ZOFRAN) IV, oxyCODONE, pentafluoroprop-tetrafluoroeth Assessment/Plan: Principal Problem:   Other depression due to general medical condition Active Problems:   Anemia of chronic disease  Hypomagnesemia   HSV (herpes simplex virus) anogenital infection   Chronic cough   Diarrhea   Acute on chronic renal failure (HCC)   Vitamin D deficiency   Secondary hyperparathyroidism of renal origin ([place])   AIDS (acquired immune deficiency syndrome) (HCC)   Uremia   FSGS (focal segmental glomerulosclerosis)   Malnutrition of moderate degree   Dialysis patient ([place])   CKD (chronic kidney disease) stage V requiring chronic dialysis ([place])  Ms. Aguillar is a 40 year old woman with poorly controlled AIDS complicated by FSGS and HSV who presents with a four-day history of nausea, vomiting, diarrhea, and abdominal pain, found to have acute on chronic renal failure and symptomatic anaemia requiring emergent HD.   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 She also has tunneled RIJ catheter placement.  She is able to walk without issue and has her son and father to help with transportation. - Nephrology and VVS following and appreciate recs - Continue aranesp, hectorol, MV - TTS dialysis  HIV/AIDS: CD4 count is 40 2/2 medication nonadherence, as patient refused to take most of her pills.  Patient appears to have decision making capacity.  Her HIV medications are all important. However, patient likely to refuse taking them after discharge.   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  - Regular diet (not renal) - Boost Breeze TID - Nutrition following  DVT/PE ppx: Heparin SQ TID  Dispo: Disposition is deferred at this time, awaiting improvement of current medical problems.  Anticipated discharge in approximately 3-4 day(s).   The patient does have a current PCP Campbell Riches, MD) and does not need an [facility] hospital follow-up appointment after discharge.  The patient does have transportation limitations that hinder transportation to clinic appointments.   LOS: 8 days   Signed Viviano Simas, MD, PhD 03/18/2015 13:20 PM

## 2015-03-18 NOTE — Care Management Note (Signed)
Case Management Note  Patient Details  Name: Paula Becker MRN: [id number] Date of Birth: 07/19/1975  Subjective/Objective:                 Patient lives at home with father. New HD, clipped [facility]. Patient state sthat her father will provide transportation to dialysis, she receives her meds through ID clinic. Has follow up appointments on AVS. Referral made to [facility].   Action/Plan:   Expected Discharge Date:                  Expected Discharge Plan:  Home/Self Care  In-House Referral:     Discharge planning Services  CM Consult  Post Acute Care Choice:  NA Choice offered to:  Patient  DME Arranged:    DME Agency:     HH Arranged:    [place] Agency:     Status of Service:  Completed, signed off  Medicare Important Message Given:  Yes Date Medicare IM Given:    Medicare IM give by:    Date Additional Medicare IM Given:    Additional Medicare Important Message give by:     If discussed at [place] of Stay Meetings, dates discussed:    Additional Comments:  Carles Collet, RN 03/18/2015, 2:42 PM

## 2015-03-18 NOTE — Telephone Encounter (Signed)
Unable to reach pt, mailed pt, dpm

## 2015-03-19 ENCOUNTER — Other Ambulatory Visit: Payer: Self-pay

## 2015-03-19 NOTE — Patient Outreach (Signed)
Unsuccessful attempts made to contact patient for transition of care and assessment of needs for community care coordination at contact numbers listed as follows: [phone]   HIPPA compliant messages left at both numbers with this RNCM's contact information   Plan: Make another attempt to contact patient via telephone if call not returned by Monday, March 22, 2015

## 2015-03-19 NOTE — Patient Outreach (Signed)
[place] [facility]) Care Management  03/19/2015  Linsay Arno [birth year] [id number]   Unsuccessful attempt x 2 made to contact patient via telephone for community care coordination. HIPPA compliant message left for patient with this RNCM's contact information.  Plan: Attempt telephone contact on Monday, February 20

## 2015-03-20 DIAGNOSIS — D631 Anemia in chronic kidney disease: Secondary | ICD-10-CM | POA: Diagnosis not present

## 2015-03-20 DIAGNOSIS — D509 Iron deficiency anemia, unspecified: Secondary | ICD-10-CM | POA: Diagnosis not present

## 2015-03-20 DIAGNOSIS — N2581 Secondary hyperparathyroidism of renal origin: Secondary | ICD-10-CM | POA: Diagnosis not present

## 2015-03-20 DIAGNOSIS — N186 End stage renal disease: Secondary | ICD-10-CM | POA: Diagnosis not present

## 2015-03-22 ENCOUNTER — Other Ambulatory Visit: Payer: Self-pay

## 2015-03-22 ENCOUNTER — Encounter: Payer: Self-pay | Admitting: Infectious Diseases

## 2015-03-22 ENCOUNTER — Ambulatory Visit (INDEPENDENT_AMBULATORY_CARE_PROVIDER_SITE_OTHER): Payer: Medicare Other | Admitting: Infectious Diseases

## 2015-03-22 VITALS — BP 114/78 | HR 114 | Temp 98.7°F | Wt 125.8 lb

## 2015-03-22 DIAGNOSIS — N186 End stage renal disease: Secondary | ICD-10-CM

## 2015-03-22 DIAGNOSIS — Z992 Dependence on renal dialysis: Secondary | ICD-10-CM

## 2015-03-22 DIAGNOSIS — B20 Human immunodeficiency virus [HIV] disease: Secondary | ICD-10-CM | POA: Diagnosis present

## 2015-03-22 DIAGNOSIS — D631 Anemia in chronic kidney disease: Secondary | ICD-10-CM | POA: Diagnosis not present

## 2015-03-22 DIAGNOSIS — D509 Iron deficiency anemia, unspecified: Secondary | ICD-10-CM | POA: Diagnosis not present

## 2015-03-22 DIAGNOSIS — N2581 Secondary hyperparathyroidism of renal origin: Secondary | ICD-10-CM | POA: Diagnosis not present

## 2015-03-22 MED ORDER — LOPINAVIR-RITONAVIR 400-100 MG/5ML PO SOLN
10.0000 mL | Freq: Every day | ORAL | Status: DC
Start: 2015-03-22 — End: 2015-08-09

## 2015-03-22 NOTE — Assessment & Plan Note (Signed)
States she is tolerating HD.  I am concerned about her L arm swelling, she has qod f/u with HD

## 2015-03-22 NOTE — Progress Notes (Signed)
   Subjective:    Patient ID: Paula Becker, female    DOB: May 04, 1975, 40 y.o.   MRN: [id number]  HPI 40 yo F with HIV/AIDS, multidrug resistant virus, poor adherence/compliance. Also with recurrent MRSA furuncles, HSV. CRI/FSGS.  Was seen earlier this summer and started on DTGV/DRVc. Was adm 1-7 to 1-12 with [facility]. She received 7 days of ceftriaxone. She returned  2-8 in acute renal failure and reuiqred HD. She underwent basilic vein transposition 2-14 and also temp HD line placement. Her ART was changed to HD dosing. She was d/c home on 2-16.  States her HD is going well. Her access site hurts.  Her LUE is swollen post surgery.  Has had trouble adjusting to pills. Was prev on liquids.   HIV 1 RNA QUANT (copies/mL)  Date Value  02/07/2015 20700  09/30/2014 103*  10/22/2013 1105*   CD4 T CELL ABS (/uL)  Date Value  02/07/2015 40*  09/30/2014 60*  05/19/2013 60*   Has rash on bottom.  Has vasc f/u in 6 weeks.   Review of Systems  Constitutional: Negative for appetite change and unexpected weight change.  Cardiovascular: Negative for leg swelling.  Gastrointestinal: Negative for diarrhea and constipation.  Genitourinary: Negative for difficulty urinating.       Objective:   Physical Exam  Constitutional: She appears well-developed and well-nourished.  HENT:  Mouth/Throat: No oropharyngeal exudate.  Cardiovascular: Normal rate, regular rhythm and normal heart sounds.   Pulmonary/Chest: Effort normal and breath sounds normal.  Abdominal: Soft. Bowel sounds are normal. There is no tenderness. There is no rebound.  Musculoskeletal:       Arms:      Feet:      Assessment & Plan:

## 2015-03-22 NOTE — Patient Outreach (Signed)
    Successful calls made to the following in an attempt to get in contact with patient [phone].  Patient identified herself by providing date of birth and address.    Was able to speak with patient before completing this note after patient's father, Paula Becker called back from [phone].  Initial telephone contact completed with patient. Patient has an appointment with Dr. Johnnye Sima later today for follow up after hospitalization. Patient has hemodialysis on Tuesday, Thursday and Saturdays.  Patient's father and brother provides transportation to appointments.   Patient and this RNCM to scheduled home visit for further assessment of community care coordination needs and to create patient's case management care plan.  Initial home visit scheduled for next week for further assessment of need for community care coordination and chronic disease education.

## 2015-03-22 NOTE — Assessment & Plan Note (Signed)
Will change her PI to KLT liquid.  She needs home health intervnetion. [facility] or [facility].  Will see her back in 2 weeks.

## 2015-03-23 DIAGNOSIS — N2581 Secondary hyperparathyroidism of renal origin: Secondary | ICD-10-CM | POA: Diagnosis not present

## 2015-03-23 DIAGNOSIS — D631 Anemia in chronic kidney disease: Secondary | ICD-10-CM | POA: Diagnosis not present

## 2015-03-23 DIAGNOSIS — N186 End stage renal disease: Secondary | ICD-10-CM | POA: Diagnosis not present

## 2015-03-23 DIAGNOSIS — D509 Iron deficiency anemia, unspecified: Secondary | ICD-10-CM | POA: Diagnosis not present

## 2015-03-25 DIAGNOSIS — D509 Iron deficiency anemia, unspecified: Secondary | ICD-10-CM | POA: Diagnosis not present

## 2015-03-25 DIAGNOSIS — N2581 Secondary hyperparathyroidism of renal origin: Secondary | ICD-10-CM | POA: Diagnosis not present

## 2015-03-25 DIAGNOSIS — D631 Anemia in chronic kidney disease: Secondary | ICD-10-CM | POA: Diagnosis not present

## 2015-03-25 DIAGNOSIS — N186 End stage renal disease: Secondary | ICD-10-CM | POA: Diagnosis not present

## 2015-03-25 LAB — REFLEX TO GENOSURE(R) MG: HIV GenoSure(R) MG PDF: 0

## 2015-03-25 LAB — HIV-1 RNA ULTRAQUANT REFLEX TO GENTYP+
HIV-1 RNA BY PCR: 8620 {copies}/mL
HIV-1 RNA QUANT, LOG: 3.936 {Log_copies}/mL

## 2015-03-26 ENCOUNTER — Telehealth (HOSPITAL_COMMUNITY): Payer: Self-pay | Admitting: *Deleted

## 2015-03-26 ENCOUNTER — Inpatient Hospital Stay (HOSPITAL_COMMUNITY): Admission: RE | Admit: 2015-03-26 | Payer: Self-pay | Source: Ambulatory Visit

## 2015-03-26 NOTE — Telephone Encounter (Signed)
Left message on voicemail asking patient to call us back at the [facility] regarding her appointment today, to see if she is coming.

## 2015-03-27 DIAGNOSIS — D509 Iron deficiency anemia, unspecified: Secondary | ICD-10-CM | POA: Diagnosis not present

## 2015-03-27 DIAGNOSIS — N186 End stage renal disease: Secondary | ICD-10-CM | POA: Diagnosis not present

## 2015-03-27 DIAGNOSIS — N2581 Secondary hyperparathyroidism of renal origin: Secondary | ICD-10-CM | POA: Diagnosis not present

## 2015-03-27 DIAGNOSIS — D631 Anemia in chronic kidney disease: Secondary | ICD-10-CM | POA: Diagnosis not present

## 2015-03-30 DIAGNOSIS — D631 Anemia in chronic kidney disease: Secondary | ICD-10-CM | POA: Diagnosis not present

## 2015-03-30 DIAGNOSIS — N041 Nephrotic syndrome with focal and segmental glomerular lesions: Secondary | ICD-10-CM | POA: Diagnosis not present

## 2015-03-30 DIAGNOSIS — N186 End stage renal disease: Secondary | ICD-10-CM | POA: Diagnosis not present

## 2015-03-30 DIAGNOSIS — Z992 Dependence on renal dialysis: Secondary | ICD-10-CM | POA: Diagnosis not present

## 2015-03-30 DIAGNOSIS — D509 Iron deficiency anemia, unspecified: Secondary | ICD-10-CM | POA: Diagnosis not present

## 2015-03-30 DIAGNOSIS — N2581 Secondary hyperparathyroidism of renal origin: Secondary | ICD-10-CM | POA: Diagnosis not present

## 2015-04-01 DIAGNOSIS — D631 Anemia in chronic kidney disease: Secondary | ICD-10-CM | POA: Diagnosis not present

## 2015-04-01 DIAGNOSIS — Z23 Encounter for immunization: Secondary | ICD-10-CM | POA: Diagnosis not present

## 2015-04-01 DIAGNOSIS — N186 End stage renal disease: Secondary | ICD-10-CM | POA: Diagnosis not present

## 2015-04-01 DIAGNOSIS — N2581 Secondary hyperparathyroidism of renal origin: Secondary | ICD-10-CM | POA: Diagnosis not present

## 2015-04-02 ENCOUNTER — Other Ambulatory Visit: Payer: Self-pay

## 2015-04-02 DIAGNOSIS — Z21 Asymptomatic human immunodeficiency virus [HIV] infection status: Secondary | ICD-10-CM

## 2015-04-02 NOTE — Patient Outreach (Signed)
[place] [facility]) Care Management  04/02/2015  Paula Becker [birth year] [id number]  Initial home visit for assessment of community care coordination. Patient lives in home with her father, step mother and son (patient's) Patient states confusion related to medications.   Reviewed medication list in EPIC with patient and call made to [facility] on [address] to gain clarification. Patient receives dialysis treatments on Tuesday, Thursday and Saturdays.     Patient hs the following medications, however, they were not on her updated medication list.   patient instructed to not take medications, placed medications away from her current medications. Norvir 100 mg  prezista 800 mg   Patient refuses to take the following medication Azithromax but does have the medication.   Need housing, because she stated it is stressful living in the home with her step mother Need medication education, [place] referral made. Application for Medicaid and EBT benefits.  [facility] LCSW referral made to assist with finding low cost housing and application of EBT benefits.   Patient also given information regarding affordable rentals at [facility], Insurance claims handler, [employer].   Patient states she and her son plan to go out later today to look at apartments.   Plan: Telephone contact next week for week#3 of transition of care.

## 2015-04-03 DIAGNOSIS — N2581 Secondary hyperparathyroidism of renal origin: Secondary | ICD-10-CM | POA: Diagnosis not present

## 2015-04-03 DIAGNOSIS — D631 Anemia in chronic kidney disease: Secondary | ICD-10-CM | POA: Diagnosis not present

## 2015-04-03 DIAGNOSIS — N186 End stage renal disease: Secondary | ICD-10-CM | POA: Diagnosis not present

## 2015-04-03 DIAGNOSIS — Z23 Encounter for immunization: Secondary | ICD-10-CM | POA: Diagnosis not present

## 2015-04-03 LAB — PHENOSENSE GT (R): PHENOSENSE GT(R) PDF: 0

## 2015-04-05 ENCOUNTER — Ambulatory Visit: Payer: Self-pay | Admitting: Infectious Diseases

## 2015-04-06 ENCOUNTER — Other Ambulatory Visit: Payer: Self-pay | Admitting: *Deleted

## 2015-04-06 DIAGNOSIS — Z23 Encounter for immunization: Secondary | ICD-10-CM | POA: Diagnosis not present

## 2015-04-06 DIAGNOSIS — N186 End stage renal disease: Secondary | ICD-10-CM | POA: Diagnosis not present

## 2015-04-06 DIAGNOSIS — N2581 Secondary hyperparathyroidism of renal origin: Secondary | ICD-10-CM | POA: Diagnosis not present

## 2015-04-06 DIAGNOSIS — D631 Anemia in chronic kidney disease: Secondary | ICD-10-CM | POA: Diagnosis not present

## 2015-04-06 NOTE — Patient Outreach (Signed)
[facility] [place] [facility]) Care Management  04/06/2015  Paula Becker [birth year] [id number]   Phone call to patient to provide community resources for low income apartments.  HIPPA compliant voicemail message left for a return call.   Sheralyn Boatman [facility] Care Management [phone]

## 2015-04-07 ENCOUNTER — Other Ambulatory Visit: Payer: Self-pay

## 2015-04-07 NOTE — Patient Outreach (Signed)
unsuccessful attempt made to contact patient via telephone for community care coordination assessment. HIPPA compliant message left for patient with this RNCM's telephone number   Plan: telephone contact on Monday, March 13 if call is not returned.

## 2015-04-08 DIAGNOSIS — N186 End stage renal disease: Secondary | ICD-10-CM | POA: Diagnosis not present

## 2015-04-08 DIAGNOSIS — D631 Anemia in chronic kidney disease: Secondary | ICD-10-CM | POA: Diagnosis not present

## 2015-04-08 DIAGNOSIS — N2581 Secondary hyperparathyroidism of renal origin: Secondary | ICD-10-CM | POA: Diagnosis not present

## 2015-04-08 DIAGNOSIS — Z23 Encounter for immunization: Secondary | ICD-10-CM | POA: Diagnosis not present

## 2015-04-09 ENCOUNTER — Other Ambulatory Visit: Payer: Self-pay | Admitting: *Deleted

## 2015-04-09 NOTE — Patient Outreach (Signed)
[facility]) Care Management  04/09/2015  Paula Becker December 01, 1975 [id number]  Phone call to patient to assess for social work needs.  Per patient she is wanting help with moving into affordable housing.  This social worker discussed contacting the [employer] for counseling assistance in locating affordable housing.   Plan:  This Education officer, museum will mail patient contact information for the [employer] as well as  Field seismologist.   Sheralyn Boatman [facility] Care Management [phone]

## 2015-04-10 DIAGNOSIS — N186 End stage renal disease: Secondary | ICD-10-CM | POA: Diagnosis not present

## 2015-04-10 DIAGNOSIS — D631 Anemia in chronic kidney disease: Secondary | ICD-10-CM | POA: Diagnosis not present

## 2015-04-10 DIAGNOSIS — N2581 Secondary hyperparathyroidism of renal origin: Secondary | ICD-10-CM | POA: Diagnosis not present

## 2015-04-10 DIAGNOSIS — Z23 Encounter for immunization: Secondary | ICD-10-CM | POA: Diagnosis not present

## 2015-04-12 ENCOUNTER — Ambulatory Visit: Payer: Self-pay

## 2015-04-12 ENCOUNTER — Telehealth: Payer: Self-pay | Admitting: *Deleted

## 2015-04-12 NOTE — Telephone Encounter (Signed)
RN reviewing patient's chart as a attempt to offer services again. On review it is noted that the patient that [facility] is making attempts to service patient. At this time I will not interfere but I have reached out to the patient making her aware that I an available if she needs me.

## 2015-04-13 ENCOUNTER — Other Ambulatory Visit: Payer: Self-pay

## 2015-04-13 DIAGNOSIS — Z23 Encounter for immunization: Secondary | ICD-10-CM | POA: Diagnosis not present

## 2015-04-13 DIAGNOSIS — N2581 Secondary hyperparathyroidism of renal origin: Secondary | ICD-10-CM | POA: Diagnosis not present

## 2015-04-13 DIAGNOSIS — N186 End stage renal disease: Secondary | ICD-10-CM | POA: Diagnosis not present

## 2015-04-13 DIAGNOSIS — D631 Anemia in chronic kidney disease: Secondary | ICD-10-CM | POA: Diagnosis not present

## 2015-04-13 NOTE — Patient Outreach (Signed)
Unsuccessful attempt made to contact patient via telephone for assessment of community care coordination HIPPA compliant message left for patient with this RNCM's contact information.  Plan: Make another attempt to contact patient on Monday, March 23 if call is not returned before then

## 2015-04-15 DIAGNOSIS — Z23 Encounter for immunization: Secondary | ICD-10-CM | POA: Diagnosis not present

## 2015-04-15 DIAGNOSIS — N186 End stage renal disease: Secondary | ICD-10-CM | POA: Diagnosis not present

## 2015-04-15 DIAGNOSIS — D631 Anemia in chronic kidney disease: Secondary | ICD-10-CM | POA: Diagnosis not present

## 2015-04-15 DIAGNOSIS — N2581 Secondary hyperparathyroidism of renal origin: Secondary | ICD-10-CM | POA: Diagnosis not present

## 2015-04-17 ENCOUNTER — Other Ambulatory Visit: Payer: Self-pay | Admitting: Internal Medicine

## 2015-04-17 DIAGNOSIS — D631 Anemia in chronic kidney disease: Secondary | ICD-10-CM | POA: Diagnosis not present

## 2015-04-17 DIAGNOSIS — N186 End stage renal disease: Secondary | ICD-10-CM | POA: Diagnosis not present

## 2015-04-17 DIAGNOSIS — Z23 Encounter for immunization: Secondary | ICD-10-CM | POA: Diagnosis not present

## 2015-04-17 DIAGNOSIS — N2581 Secondary hyperparathyroidism of renal origin: Secondary | ICD-10-CM | POA: Diagnosis not present

## 2015-04-19 ENCOUNTER — Other Ambulatory Visit: Payer: Self-pay | Admitting: Pharmacist

## 2015-04-19 NOTE — Patient Outreach (Signed)
[place] [facility]) Care Management  04/19/2015  Paula Becker [birth year] [id number]  Called patient back this afternoon per her request.  No answer and name on voicemail did not match name of patient, so did not leave message.   Will continue to attempt to reach patient.  Karrie Meres, PharmD, [place] [phone]

## 2015-04-19 NOTE — Patient Outreach (Signed)
[place] [facility]) Care Management  04/19/2015  Paula Becker [birth year] [id number]  Referral received to [place] to provide education on medications to patient.   [facility] CM Pharmacist attempted to reach patient this afternoon.  Patient answered call and verified name and date of birth.   I explained the purpose of the call and patient stated that she was resting and requested a call back later this afternoon.    Karrie Meres, PharmD, [place] [phone]

## 2015-04-20 DIAGNOSIS — D631 Anemia in chronic kidney disease: Secondary | ICD-10-CM | POA: Diagnosis not present

## 2015-04-20 DIAGNOSIS — Z23 Encounter for immunization: Secondary | ICD-10-CM | POA: Diagnosis not present

## 2015-04-20 DIAGNOSIS — N2581 Secondary hyperparathyroidism of renal origin: Secondary | ICD-10-CM | POA: Diagnosis not present

## 2015-04-20 DIAGNOSIS — N186 End stage renal disease: Secondary | ICD-10-CM | POA: Diagnosis not present

## 2015-04-20 NOTE — Patient Outreach (Signed)
[place] [facility]) Care Management  04/20/2015  Paula Becker 03-Aug-1975 [id number]   Request received by Elliot Gurney, LCSW to provide patient with housing resources. Information mailed today, 04/20/15.   [name]  [facility] Care Management Assistant

## 2015-04-22 DIAGNOSIS — N2581 Secondary hyperparathyroidism of renal origin: Secondary | ICD-10-CM | POA: Diagnosis not present

## 2015-04-22 DIAGNOSIS — D631 Anemia in chronic kidney disease: Secondary | ICD-10-CM | POA: Diagnosis not present

## 2015-04-22 DIAGNOSIS — Z23 Encounter for immunization: Secondary | ICD-10-CM | POA: Diagnosis not present

## 2015-04-22 DIAGNOSIS — N186 End stage renal disease: Secondary | ICD-10-CM | POA: Diagnosis not present

## 2015-04-23 ENCOUNTER — Other Ambulatory Visit: Payer: Self-pay

## 2015-04-23 NOTE — Patient Outreach (Signed)
Telephone call made to patient to follow up with progress on finding permanent housing for herself and son. Patient answered telephone, identified herself by providing date of birth and current address.  patient advised this RNCM she has applied for an be approved for low income housing in [place]. Patient reported further her caseworker from [place] anticipates an apartment will be available to her in 2 months.  Patient states she feels comfortable  Living in home with her father and step-mother.  Patient states further living with her father and step-mother becomes stressful at times but she now can see the light at the end of the tunnel.  Patient and this RNCM reviewed case management goals, updated them. Patient has met her case management goals of being approved for permanent housing. Patient has also met with [facility] Pharmacist for medication education and reconciliation.  Plan: Discharge patient from caseload by sending discharge letter to patient and her primary care physician.

## 2015-04-24 DIAGNOSIS — N2581 Secondary hyperparathyroidism of renal origin: Secondary | ICD-10-CM | POA: Diagnosis not present

## 2015-04-24 DIAGNOSIS — N186 End stage renal disease: Secondary | ICD-10-CM | POA: Diagnosis not present

## 2015-04-24 DIAGNOSIS — Z23 Encounter for immunization: Secondary | ICD-10-CM | POA: Diagnosis not present

## 2015-04-24 DIAGNOSIS — D631 Anemia in chronic kidney disease: Secondary | ICD-10-CM | POA: Diagnosis not present

## 2015-04-26 ENCOUNTER — Other Ambulatory Visit: Payer: Self-pay | Admitting: Pharmacist

## 2015-04-26 NOTE — Patient Outreach (Signed)
[facility]) Care Management  [place]   04/26/2015  Paula Becker 09/05/1975 [id number]  Subjective:  Patient was referred to [facility] Pharmacist by Erenest Rasher, RN, [place] for medication education.    Initially reached patient via phone on 04/19/15 and she had requested a call back later in the day and that call was not answered.  Pharmacist attempted to reach patient 04/26/15 and she did answer call, name and date of birth were verified.    Reviewed Epic medication list with patient over the phone, and patient reported that she is not presently taking any medications routinely.    She specifically stated that she is not able to swallow zidovudine capsules.  Per [place] visit note on 04/02/15, patient appears to have medications in her possession, but specifically was not taking azithromycin at that time.   Objective:   Per [facility] last fill dates include:  Dolutegravir:  03/13/15 #60/30 days Lopinavir/ritonavir:  03/23/15 for 30 day supply Viread:  03/17/15 #4/28 days  Zidovudine:  03/18/15 #90/30 days  Bactrim:  03/18/15 #15  Current Medications: Current Outpatient Prescriptions  Medication Sig Dispense Refill  . alum & mag hydroxide-simeth (MAALOX/MYLANTA) 200-200-20 MG/5ML suspension Take 30 mLs by mouth every 6 (six) hours as needed for indigestion, heartburn or flatulence. (Patient not taking: Reported on 03/10/2015) 355 mL 0  . Darbepoetin Alfa (ARANESP) 100 MCG/0.5ML SOSY injection Inject 0.5 mLs (100 mcg total) into the vein every Thursday with hemodialysis. 4.2 mL   . dolutegravir (TIVICAY) 50 MG tablet Take 1 tablet (50 mg total) by mouth 2 (two) times daily. 60 tablet 0  . doxercalciferol (HECTOROL) 4 MCG/2ML injection Inject 1 mL (2 mcg total) into the vein Every Tuesday,Thursday,and Saturday with dialysis. 2 mL   . feeding supplement (BOOST / RESOURCE BREEZE) LIQD Take 1 Container by mouth 3 (three) times daily between meals.  (Patient not taking: Reported on 04/02/2015) 237 mL 0  . fluconazole (DIFLUCAN) 100 MG tablet Take 100 mg by mouth daily.  0  . gabapentin (NEURONTIN) 300 MG capsule TAKE 1 CAPSULE BY MOUTH THREE TIMES DAILY 60 capsule 2  . hydrOXYzine (ATARAX/VISTARIL) 25 MG tablet Take 1 tablet (25 mg total) by mouth every 6 (six) hours. (Patient not taking: Reported on 04/02/2015) 20 tablet 0  . lopinavir-ritonavir (KALETRA) 400-100 MG/5ML solution Take 10 mLs (800 mg total) by mouth daily with breakfast. 3320 mL 3  . multivitamin (RENA-VIT) TABS tablet Take 1 tablet by mouth at bedtime.  0  . sulfamethoxazole-trimethoprim (BACTRIM,SEPTRA) 400-80 MG tablet Take 1 tablet by mouth 3 (three) times a week. 15 tablet 3  . tenofovir (VIREAD) 300 MG tablet Take 1 tablet (300 mg total) by mouth once a week. 30 tablet 0  . valACYclovir (VALTREX) 500 MG tablet Take 1 tablet (500 mg total) by mouth daily. (Patient not taking: Reported on 04/02/2015) 21 tablet 0  . zidovudine (RETROVIR) 100 MG capsule Take 3 capsules (300 mg total) by mouth daily with breakfast. 90 capsule 0   No current facility-administered medications for this visit.    Functional Status: In your present state of health, do you have any difficulty performing the following activities: 04/23/2015 03/22/2015  Hearing? N N  Vision? N N  Difficulty concentrating or making decisions? N N  Walking or climbing stairs? N Y  Dressing or bathing? N Y  Doing errands, shopping? N Y  Conservation officer, nature and eating ? Y Y  Using the Toilet? N N  In the past six months, have you accidently leaked urine? N N  Do you have problems with loss of bowel control? N N  Managing your Medications? N Y  Managing your Finances? N Y  Housekeeping or managing your Housekeeping? N Y    Fall/Depression Screening: PHQ 2/9 Scores 03/22/2015 01/18/2015 11/30/2014 09/30/2014 07/15/2014 04/29/2014 03/20/2014  PHQ - 2 Score 0 1 2 1  0 0 0  PHQ- 9 Score - - 7 - - - -    Assessment:  1)  Medication adherence:    Patient reports to pharmacist that she is not taking any medication on a routine basis.  When asked if there is a reason why, she states that she is having trouble swallowing pills.    Called [place], to verify fill history as noted above and was also told that dolutegravir, lopinavir/ritonavir, Viread, and zidovudine are filled and waiting for pick-up as of 04/26/15.    There does appear to gaps in fill history and patient does have noted compliance issues based on review of past ID notes.  It appears patient may have had trouble with pills in the past.     Plan:  Will send note and in basket message to Dr Johnnye Sima to make him aware of patient report that she is not able to swallow pills and that is the reason that patient is saying adherence is difficult for her right now.   Will close out pharmacy case at this time.     Karrie Meres, PharmD, [place] [phone]

## 2015-04-27 ENCOUNTER — Encounter: Payer: Self-pay | Admitting: Vascular Surgery

## 2015-04-27 DIAGNOSIS — Z23 Encounter for immunization: Secondary | ICD-10-CM | POA: Diagnosis not present

## 2015-04-27 DIAGNOSIS — N186 End stage renal disease: Secondary | ICD-10-CM | POA: Diagnosis not present

## 2015-04-27 DIAGNOSIS — N2581 Secondary hyperparathyroidism of renal origin: Secondary | ICD-10-CM | POA: Diagnosis not present

## 2015-04-27 DIAGNOSIS — D631 Anemia in chronic kidney disease: Secondary | ICD-10-CM | POA: Diagnosis not present

## 2015-04-28 ENCOUNTER — Ambulatory Visit (HOSPITAL_COMMUNITY)
Admission: RE | Admit: 2015-04-28 | Discharge: 2015-04-28 | Disposition: A | Discharge status: Home / Self Care | Payer: Medicare Other | Source: Ambulatory Visit | Attending: Vascular Surgery | Admitting: Vascular Surgery

## 2015-04-28 DIAGNOSIS — N186 End stage renal disease: Secondary | ICD-10-CM | POA: Diagnosis not present

## 2015-04-28 DIAGNOSIS — B20 Human immunodeficiency virus [HIV] disease: Secondary | ICD-10-CM | POA: Insufficient documentation

## 2015-04-28 DIAGNOSIS — Z4931 Encounter for adequacy testing for hemodialysis: Secondary | ICD-10-CM | POA: Diagnosis not present

## 2015-04-29 DIAGNOSIS — D631 Anemia in chronic kidney disease: Secondary | ICD-10-CM | POA: Diagnosis not present

## 2015-04-29 DIAGNOSIS — N186 End stage renal disease: Secondary | ICD-10-CM | POA: Diagnosis not present

## 2015-04-29 DIAGNOSIS — Z23 Encounter for immunization: Secondary | ICD-10-CM | POA: Diagnosis not present

## 2015-04-29 DIAGNOSIS — N2581 Secondary hyperparathyroidism of renal origin: Secondary | ICD-10-CM | POA: Diagnosis not present

## 2015-04-29 NOTE — Progress Notes (Signed)
This encounter was created in error - please disregard.

## 2015-04-30 DIAGNOSIS — N041 Nephrotic syndrome with focal and segmental glomerular lesions: Secondary | ICD-10-CM | POA: Diagnosis not present

## 2015-04-30 DIAGNOSIS — Z992 Dependence on renal dialysis: Secondary | ICD-10-CM | POA: Diagnosis not present

## 2015-04-30 DIAGNOSIS — N186 End stage renal disease: Secondary | ICD-10-CM | POA: Diagnosis not present

## 2015-05-01 DIAGNOSIS — N2581 Secondary hyperparathyroidism of renal origin: Secondary | ICD-10-CM | POA: Diagnosis not present

## 2015-05-01 DIAGNOSIS — D631 Anemia in chronic kidney disease: Secondary | ICD-10-CM | POA: Diagnosis not present

## 2015-05-01 DIAGNOSIS — D509 Iron deficiency anemia, unspecified: Secondary | ICD-10-CM | POA: Diagnosis not present

## 2015-05-01 DIAGNOSIS — N186 End stage renal disease: Secondary | ICD-10-CM | POA: Diagnosis not present

## 2015-05-04 ENCOUNTER — Telehealth: Payer: Self-pay | Admitting: *Deleted

## 2015-05-04 NOTE — Telephone Encounter (Signed)
Patient called, asking if she had an appointment recently with [name], or if she needed to schedule one. Patient no-showed on 3/6.  Next available appointment is 5/24. Patient has dialysis Tues/Thurs/Sat.  Patient will be able to make the appointment 5/34. Landis Gandy, RN

## 2015-05-05 ENCOUNTER — Encounter: Payer: Self-pay | Admitting: *Deleted

## 2015-05-05 ENCOUNTER — Encounter: Payer: Medicare Other | Admitting: Vascular Surgery

## 2015-05-05 ENCOUNTER — Other Ambulatory Visit: Payer: Self-pay | Admitting: *Deleted

## 2015-05-05 NOTE — Patient Outreach (Addendum)
[facility]) Care Management  05/05/2015  Paula Becker [birth year] [id number]   Phone call to patient to confirm that she received the affordable housing information that was mailed to her by the [facility] case management assistant. Contact information for emergency assistance also provided in the mailing. Patient confirmed that she received the information and had no further questions or concerns regarding the resources received. This Education officer, museum also read in the notes by [facility]  that patient has been approved for [employer].  Case to be closed to social work at this time. Patient encouraged to contact this social worker if she has any questions or concerns in the future.   Sheralyn Boatman [facility] Care Management [phone]

## 2015-05-19 ENCOUNTER — Encounter: Payer: Self-pay | Admitting: Vascular Surgery

## 2015-05-26 ENCOUNTER — Ambulatory Visit (INDEPENDENT_AMBULATORY_CARE_PROVIDER_SITE_OTHER): Payer: Medicare Other | Admitting: Vascular Surgery

## 2015-05-26 ENCOUNTER — Encounter: Payer: Self-pay | Admitting: Vascular Surgery

## 2015-05-26 VITALS — BP 123/87 | HR 88 | Temp 98.3°F | Resp 16 | Ht 71.0 in | Wt 113.0 lb

## 2015-05-26 DIAGNOSIS — N185 Chronic kidney disease, stage 5: Secondary | ICD-10-CM

## 2015-05-26 NOTE — Progress Notes (Signed)
Patient name: Pria Canham MRN: [id number] DOB: April 29, 1975 Sex: female  REASON FOR VISIT: Follow up after left basilic vein transposition  HPI: Laila Jarzabek is a 40 y.o. female had a dialysis catheter placed and a left basilic vein transposition on 03/16/15. She comes in for a follow up visit. She occasionally describe some pain in the left hand. She dialyzes with her catheter on Tuesdays Thursdays and Saturdays. Otherwise she has no specific complaints.  Current Outpatient Prescriptions  Medication Sig Dispense Refill  . Darbepoetin Alfa (ARANESP) 100 MCG/0.5ML SOSY injection Inject 0.5 mLs (100 mcg total) into the vein every Thursday with hemodialysis. 4.2 mL   . dolutegravir (TIVICAY) 50 MG tablet Take 1 tablet (50 mg total) by mouth 2 (two) times daily. 60 tablet 0  . doxercalciferol (HECTOROL) 4 MCG/2ML injection Inject 1 mL (2 mcg total) into the vein Every Tuesday,Thursday,and Saturday with dialysis. 2 mL   . fluconazole (DIFLUCAN) 100 MG tablet Take 100 mg by mouth daily.  0  . gabapentin (NEURONTIN) 300 MG capsule TAKE 1 CAPSULE BY MOUTH THREE TIMES DAILY 60 capsule 2  . hydrOXYzine (ATARAX/VISTARIL) 25 MG tablet Take 1 tablet (25 mg total) by mouth every 6 (six) hours. 20 tablet 0  . lopinavir-ritonavir (KALETRA) 400-100 MG/5ML solution Take 10 mLs (800 mg total) by mouth daily with breakfast. 3320 mL 3  . multivitamin (RENA-VIT) TABS tablet Take 1 tablet by mouth at bedtime.  0  . tenofovir (VIREAD) 300 MG tablet Take 1 tablet (300 mg total) by mouth once a week. 30 tablet 0  . valACYclovir (VALTREX) 500 MG tablet Take 1 tablet (500 mg total) by mouth daily. 21 tablet 0  . zidovudine (RETROVIR) 100 MG capsule Take 3 capsules (300 mg total) by mouth daily with breakfast. 90 capsule 0  . alum & mag hydroxide-simeth (MAALOX/MYLANTA) 200-200-20 MG/5ML suspension Take 30 mLs by mouth every 6 (six) hours as needed for indigestion, heartburn or flatulence. (Patient not taking: Reported on  05/26/2015) 355 mL 0  . feeding supplement (BOOST / RESOURCE BREEZE) LIQD Take 1 Container by mouth 3 (three) times daily between meals. (Patient not taking: Reported on 05/26/2015) 237 mL 0   No current facility-administered medications for this visit.    REVIEW OF SYSTEMS:  [X]  denotes positive finding, [ ]  denotes negative finding Cardiac  Comments:  Chest pain or chest pressure:    Shortness of breath upon exertion:    Short of breath when lying flat:    Irregular heart rhythm:    Constitutional    Fever or chills:      PHYSICAL EXAM: Filed Vitals:   05/26/15 0948  BP: 123/87  Pulse: 88  Temp: 98.3 F (36.8 C)  TempSrc: Oral  Resp: 16  Height: 5\' 11"  (1.803 m)  Weight: 113 lb (51.256 kg)  SpO2: 98%    GENERAL: The patient is a well-nourished female, in no acute distress. The vital signs are documented above. CARDIOVASCULAR: There is a regular rate and rhythm. PULMONARY: There is good air exchange bilaterally without wheezing or rales. Her fistula has an excellent thrill and appears to be maturing adequately. She has a palpable left radial pulse.  DUPLEX DIALYSIS: I have reviewed her duplex scan that was done on 04/28/2015. This shows that the diameters of the fistula ranged from 0.4-0.78 cm. Appear Reasonable.  MEDICAL ISSUES:  LEFT BASILIC VEIN TRANSPOSITION: Her fistula appears to be maturing adequately. I think it should be ready for cannulation in mid May. I  would start with a smaller needle and work from there. If there is any problem certainly we could consider fistulogram however I do not identify any specific problems by ultrasound.  Deitra Mayo [facility]: [phone]

## 2015-05-30 DIAGNOSIS — N041 Nephrotic syndrome with focal and segmental glomerular lesions: Secondary | ICD-10-CM | POA: Diagnosis not present

## 2015-05-30 DIAGNOSIS — N186 End stage renal disease: Secondary | ICD-10-CM | POA: Diagnosis not present

## 2015-05-30 DIAGNOSIS — Z992 Dependence on renal dialysis: Secondary | ICD-10-CM | POA: Diagnosis not present

## 2015-06-01 DIAGNOSIS — D631 Anemia in chronic kidney disease: Secondary | ICD-10-CM | POA: Diagnosis not present

## 2015-06-01 DIAGNOSIS — N2581 Secondary hyperparathyroidism of renal origin: Secondary | ICD-10-CM | POA: Diagnosis not present

## 2015-06-01 DIAGNOSIS — D509 Iron deficiency anemia, unspecified: Secondary | ICD-10-CM | POA: Diagnosis not present

## 2015-06-01 DIAGNOSIS — N186 End stage renal disease: Secondary | ICD-10-CM | POA: Diagnosis not present

## 2015-06-09 ENCOUNTER — Ambulatory Visit (INDEPENDENT_AMBULATORY_CARE_PROVIDER_SITE_OTHER): Payer: Medicare Other | Admitting: Infectious Diseases

## 2015-06-09 ENCOUNTER — Encounter: Payer: Self-pay | Admitting: Infectious Diseases

## 2015-06-09 VITALS — BP 112/77 | HR 92 | Temp 98.0°F | Wt 115.0 lb

## 2015-06-09 DIAGNOSIS — B20 Human immunodeficiency virus [HIV] disease: Secondary | ICD-10-CM | POA: Diagnosis not present

## 2015-06-09 MED ORDER — SULFAMETHOXAZOLE-TRIMETHOPRIM 400-80 MG/5ML IV SOLN: 160.0000 mg | INTRAVENOUS | Status: DC

## 2015-06-09 NOTE — Assessment & Plan Note (Signed)
Have tried prev to get ambre in to see her.  Today she says she is amenable to having help.  Will also start her on bactrim.  Will see her back in 2 months

## 2015-06-09 NOTE — Assessment & Plan Note (Signed)
Tu/th/sat appreciate renal f/u

## 2015-06-09 NOTE — Progress Notes (Signed)
   Subjective:    Patient ID: Paula Becker, female    DOB: 05-21-1975, 40 y.o.   MRN: [id number]  HPI 40 yo F with HIV/AIDS, multidrug resistant virus, poor adherence/compliance. Also with recurrent MRSA furuncles, HSV. CRI/FSGS.  Was seen earlier this summer and started on DTGV/DRVc. Was adm 1-7 to 02-11-15 with [facility]NA. She received 7 days of ceftriaxone. She returned 03-10-15 in acute renal failure and reuiqred HD. She underwent basilic vein transposition 2-14 and also temp HD line placement. Her ART was changed to HD dosing. She was d/c home on 2-16.   Today she complains of diffuse pain. Has not been taking her ART. KLT/DTGV/TFV/AZT Has not felt like taking since she has been on HD. Her appetite has been variable.   HIV 1 RNA QUANT (copies/mL)  Date Value  02/07/2015 20700  09/30/2014 103*  10/22/2013 1105*   CD4 T CELL ABS (/uL)  Date Value  02/07/2015 40*  09/30/2014 60*  05/19/2013 60*   No problems with HD site.   Review of Systems  Constitutional: Positive for appetite change. Negative for unexpected weight change.  Respiratory: Negative for cough and shortness of breath.   Gastrointestinal: Negative for diarrhea and constipation.  Genitourinary: Negative for difficulty urinating.  Musculoskeletal: Positive for myalgias.  Psychiatric/Behavioral: Positive for sleep disturbance.       Objective:   Physical Exam  Constitutional: She appears well-developed. She appears cachectic.  HENT:  Mouth/Throat: No oropharyngeal exudate.  Eyes: EOM are normal. Pupils are equal, round, and reactive to light.  Neck: Neck supple.  Cardiovascular: Normal rate, regular rhythm and normal heart sounds.   Pulmonary/Chest: Effort normal and breath sounds normal.    Abdominal: Soft. Bowel sounds are normal. There is no tenderness. There is no rebound.  Musculoskeletal:       Arms: Lymphadenopathy:    She has no cervical adenopathy.          Assessment & Plan:

## 2015-06-18 ENCOUNTER — Telehealth: Payer: Self-pay | Admitting: *Deleted

## 2015-06-18 NOTE — Telephone Encounter (Signed)
RN received a referral for [name] due to her continued failing health and trouble with medication adherence. RN contacted [name] and spoke with her today. RN reintroduced myself and stated that I would love to being to make home visits with her again. Paula Becker stated that would be fine but she would rather meet me at the clinic instead. Paula Becker agreed to meet me at the clinic this coming Wednesday at [place] made for that time

## 2015-06-23 ENCOUNTER — Ambulatory Visit: Payer: Self-pay | Admitting: Infectious Diseases

## 2015-06-23 ENCOUNTER — Ambulatory Visit: Payer: Self-pay | Admitting: *Deleted

## 2015-06-23 DIAGNOSIS — B20 Human immunodeficiency virus [HIV] disease: Secondary | ICD-10-CM

## 2015-06-24 ENCOUNTER — Telehealth: Payer: Self-pay

## 2015-06-24 NOTE — Telephone Encounter (Signed)
Patient called doctor's line asking whether it was okay for dialysis to put another dialysis catheter (shunt) in. Advised patient to contact her dialysis center that she attends and the surgeon office that is putting in her catheter in for any questions due to we do not handle her dialysis here. Patient stated understanding, and said she will call. Rodman Key, LPN

## 2015-06-30 DIAGNOSIS — Z992 Dependence on renal dialysis: Secondary | ICD-10-CM | POA: Diagnosis not present

## 2015-06-30 DIAGNOSIS — N041 Nephrotic syndrome with focal and segmental glomerular lesions: Secondary | ICD-10-CM | POA: Diagnosis not present

## 2015-06-30 DIAGNOSIS — N186 End stage renal disease: Secondary | ICD-10-CM | POA: Diagnosis not present

## 2015-07-01 DIAGNOSIS — N186 End stage renal disease: Secondary | ICD-10-CM | POA: Diagnosis not present

## 2015-07-01 DIAGNOSIS — N2581 Secondary hyperparathyroidism of renal origin: Secondary | ICD-10-CM | POA: Diagnosis not present

## 2015-07-01 DIAGNOSIS — D509 Iron deficiency anemia, unspecified: Secondary | ICD-10-CM | POA: Diagnosis not present

## 2015-07-01 DIAGNOSIS — D631 Anemia in chronic kidney disease: Secondary | ICD-10-CM | POA: Diagnosis not present

## 2015-07-01 DIAGNOSIS — Z23 Encounter for immunization: Secondary | ICD-10-CM | POA: Diagnosis not present

## 2015-07-02 ENCOUNTER — Telehealth: Payer: Self-pay | Admitting: *Deleted

## 2015-07-02 NOTE — Telephone Encounter (Signed)
RN contacted the patient to arrange for our next visit. Mckailey stated she really does not like to have people come to her father's home but would not mind meeting me at the Clinic. Patient asked if we could do Monday. RN agreed and advised the patient that we can meet at a park of somewhere close to her home if the clinic is to far for her. Jocelyne thanked me and said the clinic would be fine

## 2015-07-03 DIAGNOSIS — D509 Iron deficiency anemia, unspecified: Secondary | ICD-10-CM | POA: Diagnosis not present

## 2015-07-03 DIAGNOSIS — D631 Anemia in chronic kidney disease: Secondary | ICD-10-CM | POA: Diagnosis not present

## 2015-07-03 DIAGNOSIS — N186 End stage renal disease: Secondary | ICD-10-CM | POA: Diagnosis not present

## 2015-07-03 DIAGNOSIS — N2581 Secondary hyperparathyroidism of renal origin: Secondary | ICD-10-CM | POA: Diagnosis not present

## 2015-07-03 DIAGNOSIS — Z23 Encounter for immunization: Secondary | ICD-10-CM | POA: Diagnosis not present

## 2015-07-05 ENCOUNTER — Ambulatory Visit: Payer: Medicare Other | Admitting: *Deleted

## 2015-07-05 DIAGNOSIS — B20 Human immunodeficiency virus [HIV] disease: Secondary | ICD-10-CM

## 2015-07-06 DIAGNOSIS — D509 Iron deficiency anemia, unspecified: Secondary | ICD-10-CM | POA: Diagnosis not present

## 2015-07-06 DIAGNOSIS — N186 End stage renal disease: Secondary | ICD-10-CM | POA: Diagnosis not present

## 2015-07-06 DIAGNOSIS — Z23 Encounter for immunization: Secondary | ICD-10-CM | POA: Diagnosis not present

## 2015-07-06 DIAGNOSIS — D631 Anemia in chronic kidney disease: Secondary | ICD-10-CM | POA: Diagnosis not present

## 2015-07-06 DIAGNOSIS — N2581 Secondary hyperparathyroidism of renal origin: Secondary | ICD-10-CM | POA: Diagnosis not present

## 2015-07-08 DIAGNOSIS — D509 Iron deficiency anemia, unspecified: Secondary | ICD-10-CM | POA: Diagnosis not present

## 2015-07-08 DIAGNOSIS — N186 End stage renal disease: Secondary | ICD-10-CM | POA: Diagnosis not present

## 2015-07-08 DIAGNOSIS — D631 Anemia in chronic kidney disease: Secondary | ICD-10-CM | POA: Diagnosis not present

## 2015-07-08 DIAGNOSIS — Z23 Encounter for immunization: Secondary | ICD-10-CM | POA: Diagnosis not present

## 2015-07-08 DIAGNOSIS — N2581 Secondary hyperparathyroidism of renal origin: Secondary | ICD-10-CM | POA: Diagnosis not present

## 2015-07-10 DIAGNOSIS — D631 Anemia in chronic kidney disease: Secondary | ICD-10-CM | POA: Diagnosis not present

## 2015-07-10 DIAGNOSIS — D509 Iron deficiency anemia, unspecified: Secondary | ICD-10-CM | POA: Diagnosis not present

## 2015-07-10 DIAGNOSIS — N186 End stage renal disease: Secondary | ICD-10-CM | POA: Diagnosis not present

## 2015-07-10 DIAGNOSIS — Z23 Encounter for immunization: Secondary | ICD-10-CM | POA: Diagnosis not present

## 2015-07-10 DIAGNOSIS — N2581 Secondary hyperparathyroidism of renal origin: Secondary | ICD-10-CM | POA: Diagnosis not present

## 2015-07-12 DIAGNOSIS — H43813 Vitreous degeneration, bilateral: Secondary | ICD-10-CM | POA: Diagnosis not present

## 2015-07-12 DIAGNOSIS — H1045 Other chronic allergic conjunctivitis: Secondary | ICD-10-CM | POA: Diagnosis not present

## 2015-07-12 DIAGNOSIS — H40013 Open angle with borderline findings, low risk, bilateral: Secondary | ICD-10-CM | POA: Diagnosis not present

## 2015-07-13 DIAGNOSIS — Z23 Encounter for immunization: Secondary | ICD-10-CM | POA: Diagnosis not present

## 2015-07-13 DIAGNOSIS — D509 Iron deficiency anemia, unspecified: Secondary | ICD-10-CM | POA: Diagnosis not present

## 2015-07-13 DIAGNOSIS — N186 End stage renal disease: Secondary | ICD-10-CM | POA: Diagnosis not present

## 2015-07-13 DIAGNOSIS — D631 Anemia in chronic kidney disease: Secondary | ICD-10-CM | POA: Diagnosis not present

## 2015-07-13 DIAGNOSIS — N2581 Secondary hyperparathyroidism of renal origin: Secondary | ICD-10-CM | POA: Diagnosis not present

## 2015-07-14 ENCOUNTER — Telehealth: Payer: Self-pay | Admitting: *Deleted

## 2015-07-14 NOTE — Telephone Encounter (Signed)
  Patient was evaluated on 06/23/2015 for CBHCNS. Patient was consented to care at this time.   Frequency / Duration of CBHCN effective 06/23/2015: 77mo1, 26mo2, 15mo1 with 3 PRN visits for change in medication regiment, complications with medication regimen, or complications with HIV progression. CBHCN will assess for learning needs related to diagnosis and treatment regimen, provide education as needed, fill pill box weekly or as needed, prefill syringes if liquid medications is required as needed, communicate with care team including physician and case managers.  Individualized Plan Of Care   Certification Period of 06/23/2015 to 09/21/2015 a. Type of service(s) and care to be delivered: [facility] Nurse b. Frequency and duration of service: 22mo1, 65mo2, 72mo1 with 3 PRN visits for change in medication regiment, complications with medication regimen, or complications with HIV progression. c. Activity restrictions: no noted activity restrictions, pt can get up as tolerated without a assistive device safely d. Safety Measures: Standard Precautions/Infection Control e. Service Objectives and Goals:. It appears that she has trouble saying HIV positive and refers to her virus as "people with this". During our conversation she relayed feelings of not being supported, but had difficulty owning responsibility for her current health. It was observed that the patient may have some nutritional concerns but states she will not eat the donated food that was given to her during this visit. RN's plan to assist the patient in meeting her goals by helping her define and establish a sense of purpose through offering a support system. Develop a medication system that is simple for the patient to comply with and follow. Further evaluate nutritional needs and possible concerns. Assist the patient with linking with available community resources by making referrals as needed to address her Housing, management  of bills and food. f. Equipment required: No additional Equipment needed at this time g. Functional Limitations: Vision h. Rehabilitation potential: Guarded i. Diet and Nutritional Needs: Regular Diet j. Medications and treatments: Have been reconciled for accuracy and are listed in EPIC k. Specific therapies if needed: Not applicable l. Pertinent diagnoses: HIV (CD4 <100), recurrent pneumonia, HSV, NonCompliance with medication regimen, severe protein-calorie malnutrition, ESRD with dialysis m. Expected outcome: Poor

## 2015-07-14 NOTE — Patient Instructions (Signed)
RN met with client and or caregiver for assessment. RN reviewed Agency Services, [place] Notice of Privacy Policy, Home Safety Management Information Booklet. Home Fire Safety Assessment, Fall Risk Assessment and Suicide Risk Assessment was performed. RN also discussed information on a Living Will, Advanced Directives, and Health Care Power of Attorney. RN and Client/Designated Party educated/reviewed/signed Client Agreement and Consent for Service form along with Patient Rights and Responsibilities statement. RN developed patient specific and centered care plan. RN provided contact information and reviewed how to receive emergency help after hours for schedule changes, billing questions, reporting of safety issues, falls, concerns or any needs/questions. Standard Precaution and Infection control along with interventions to correct or prevent high risk behaviors instructed to the patient. Client/Caregiver reports understanding and agreement with the above 

## 2015-07-14 NOTE — Progress Notes (Signed)
  Patient was evaluated on 06/23/2015 for CBHCNS. Patient was consented to care at this time.   Frequency / Duration of CBHCN effective 06/23/2015: 18mo1, 31mo2, 71mo1 with 3 PRN visits for change in medication regiment, complications with medication regimen, or complications with HIV progression. CBHCN will assess for learning needs related to diagnosis and treatment regimen, provide education as needed, fill pill box weekly or as needed, prefill syringes if liquid medications is required as needed, communicate with care team including physician and case managers.  Individualized Plan Of Care   Certification Period of 06/23/2015 to 09/21/2015 a. Type of service(s) and care to be delivered: [facility] Nurse b. Frequency and duration of service: 25mo1, 47mo2, 55mo1 with 3 PRN visits for change in medication regiment, complications with medication regimen, or complications with HIV progression. c. Activity restrictions: no noted activity restrictions, pt can get up as tolerated without a assistive device safely d. Safety Measures: Standard Precautions/Infection Control e. Service Objectives and Goals:. It appears that she has trouble saying HIV positive and refers to her virus as "people with this". During our conversation she relayed feelings of not being supported, but had difficulty owning responsibility for her current health. It was observed that the patient may have some nutritional concerns but states she will not eat the donated food that was given to her during this visit. RN's plan to assist the patient in meeting her goals by helping her define and establish a sense of purpose through offering a support system. Develop a medication system that is simple for the patient to comply with and follow. Further evaluate nutritional needs and possible concerns. Assist the patient with linking with available community resources by making referrals as needed to address her Housing, management  of bills and food. f. Equipment required: No additional Equipment needed at this time g. Functional Limitations: Vision h. Rehabilitation potential: Guarded i. Diet and Nutritional Needs: Regular Diet j. Medications and treatments: Have been reconciled for accuracy and are listed in EPIC k. Specific therapies if needed: Not applicable l. Pertinent diagnoses: HIV (CD4 <100), recurrent pneumonia, HSV, NonCompliance with medication regimen, severe protein-calorie malnutrition, ESRD with dialysis m. Expected outcome: Poor

## 2015-07-15 DIAGNOSIS — D631 Anemia in chronic kidney disease: Secondary | ICD-10-CM | POA: Diagnosis not present

## 2015-07-15 DIAGNOSIS — Z23 Encounter for immunization: Secondary | ICD-10-CM | POA: Diagnosis not present

## 2015-07-15 DIAGNOSIS — D509 Iron deficiency anemia, unspecified: Secondary | ICD-10-CM | POA: Diagnosis not present

## 2015-07-15 DIAGNOSIS — N2581 Secondary hyperparathyroidism of renal origin: Secondary | ICD-10-CM | POA: Diagnosis not present

## 2015-07-15 DIAGNOSIS — N186 End stage renal disease: Secondary | ICD-10-CM | POA: Diagnosis not present

## 2015-07-15 NOTE — Progress Notes (Signed)
RN planned a visit with the patient prior to today but Arisbel did not show for out appointment  Purpose of this communication is to relay that that the set frequency for home visits this week has been changed due to a missed visit. Communication has been made with the patient to ensure needs have been met and to offer a home visit. At this time a return call has not been received before the business week is out. Next week the [place] Nurse plans to continue contact with the patient to offer any services or attempt to address any needs the patient expresses that is reasonable. ment. RN will attempt another visit with the patinet

## 2015-07-16 NOTE — Telephone Encounter (Signed)
Agree with plan. thanks

## 2015-07-17 DIAGNOSIS — D509 Iron deficiency anemia, unspecified: Secondary | ICD-10-CM | POA: Diagnosis not present

## 2015-07-17 DIAGNOSIS — N186 End stage renal disease: Secondary | ICD-10-CM | POA: Diagnosis not present

## 2015-07-17 DIAGNOSIS — Z23 Encounter for immunization: Secondary | ICD-10-CM | POA: Diagnosis not present

## 2015-07-17 DIAGNOSIS — N2581 Secondary hyperparathyroidism of renal origin: Secondary | ICD-10-CM | POA: Diagnosis not present

## 2015-07-17 DIAGNOSIS — D631 Anemia in chronic kidney disease: Secondary | ICD-10-CM | POA: Diagnosis not present

## 2015-07-20 DIAGNOSIS — N186 End stage renal disease: Secondary | ICD-10-CM | POA: Diagnosis not present

## 2015-07-20 DIAGNOSIS — Z23 Encounter for immunization: Secondary | ICD-10-CM | POA: Diagnosis not present

## 2015-07-20 DIAGNOSIS — D631 Anemia in chronic kidney disease: Secondary | ICD-10-CM | POA: Diagnosis not present

## 2015-07-20 DIAGNOSIS — D509 Iron deficiency anemia, unspecified: Secondary | ICD-10-CM | POA: Diagnosis not present

## 2015-07-20 DIAGNOSIS — N2581 Secondary hyperparathyroidism of renal origin: Secondary | ICD-10-CM | POA: Diagnosis not present

## 2015-07-21 DIAGNOSIS — H40023 Open angle with borderline findings, high risk, bilateral: Secondary | ICD-10-CM | POA: Diagnosis not present

## 2015-07-22 ENCOUNTER — Telehealth: Payer: Self-pay | Admitting: *Deleted

## 2015-07-22 DIAGNOSIS — Z23 Encounter for immunization: Secondary | ICD-10-CM | POA: Diagnosis not present

## 2015-07-22 DIAGNOSIS — N2581 Secondary hyperparathyroidism of renal origin: Secondary | ICD-10-CM | POA: Diagnosis not present

## 2015-07-22 DIAGNOSIS — D631 Anemia in chronic kidney disease: Secondary | ICD-10-CM | POA: Diagnosis not present

## 2015-07-22 DIAGNOSIS — D509 Iron deficiency anemia, unspecified: Secondary | ICD-10-CM | POA: Diagnosis not present

## 2015-07-22 DIAGNOSIS — N186 End stage renal disease: Secondary | ICD-10-CM | POA: Diagnosis not present

## 2015-07-22 NOTE — Telephone Encounter (Signed)
Patient called stating she is not happy with her dialysis center and wants to be referred somewhere else. Advise her that is not something that this office does and she should speak to her current dialysis office and they may possibly be able to help her with this issue. Myrtis Hopping

## 2015-07-27 ENCOUNTER — Telehealth: Payer: Self-pay | Admitting: *Deleted

## 2015-07-27 DIAGNOSIS — D631 Anemia in chronic kidney disease: Secondary | ICD-10-CM | POA: Diagnosis not present

## 2015-07-27 DIAGNOSIS — N186 End stage renal disease: Secondary | ICD-10-CM | POA: Diagnosis not present

## 2015-07-27 DIAGNOSIS — D509 Iron deficiency anemia, unspecified: Secondary | ICD-10-CM | POA: Diagnosis not present

## 2015-07-27 DIAGNOSIS — Z23 Encounter for immunization: Secondary | ICD-10-CM | POA: Diagnosis not present

## 2015-07-27 DIAGNOSIS — N2581 Secondary hyperparathyroidism of renal origin: Secondary | ICD-10-CM | POA: Diagnosis not present

## 2015-07-27 NOTE — Telephone Encounter (Addendum)
RN contacted the patient without a answer. Left a message asking the patient to please return my call. I would like to arrange a visit with the patient.  Purpose of this communication is to relay that that the set frequency for home visits this week has been changed due to a missed visit. Communication has been made with the patient to ensure needs have been met and to offer a home visit. At this time a return call has not been received before the business week is out. Next week the [place] Nurse plans to continue contact with the patient to offer any services or attempt to address any needs the patient expresses that is reasonable.

## 2015-07-29 DIAGNOSIS — N186 End stage renal disease: Secondary | ICD-10-CM | POA: Diagnosis not present

## 2015-07-29 DIAGNOSIS — D631 Anemia in chronic kidney disease: Secondary | ICD-10-CM | POA: Diagnosis not present

## 2015-07-29 DIAGNOSIS — N2581 Secondary hyperparathyroidism of renal origin: Secondary | ICD-10-CM | POA: Diagnosis not present

## 2015-07-29 DIAGNOSIS — Z23 Encounter for immunization: Secondary | ICD-10-CM | POA: Diagnosis not present

## 2015-07-29 DIAGNOSIS — D509 Iron deficiency anemia, unspecified: Secondary | ICD-10-CM | POA: Diagnosis not present

## 2015-07-30 DIAGNOSIS — N186 End stage renal disease: Secondary | ICD-10-CM | POA: Diagnosis not present

## 2015-07-30 DIAGNOSIS — N041 Nephrotic syndrome with focal and segmental glomerular lesions: Secondary | ICD-10-CM | POA: Diagnosis not present

## 2015-07-30 DIAGNOSIS — Z992 Dependence on renal dialysis: Secondary | ICD-10-CM | POA: Diagnosis not present

## 2015-07-31 DIAGNOSIS — D631 Anemia in chronic kidney disease: Secondary | ICD-10-CM | POA: Diagnosis not present

## 2015-07-31 DIAGNOSIS — N2581 Secondary hyperparathyroidism of renal origin: Secondary | ICD-10-CM | POA: Diagnosis not present

## 2015-07-31 DIAGNOSIS — D509 Iron deficiency anemia, unspecified: Secondary | ICD-10-CM | POA: Diagnosis not present

## 2015-07-31 DIAGNOSIS — N186 End stage renal disease: Secondary | ICD-10-CM | POA: Diagnosis not present

## 2015-08-05 DIAGNOSIS — D631 Anemia in chronic kidney disease: Secondary | ICD-10-CM | POA: Diagnosis not present

## 2015-08-05 DIAGNOSIS — N186 End stage renal disease: Secondary | ICD-10-CM | POA: Diagnosis not present

## 2015-08-05 DIAGNOSIS — N2581 Secondary hyperparathyroidism of renal origin: Secondary | ICD-10-CM | POA: Diagnosis not present

## 2015-08-05 DIAGNOSIS — D509 Iron deficiency anemia, unspecified: Secondary | ICD-10-CM | POA: Diagnosis not present

## 2015-08-06 ENCOUNTER — Encounter: Payer: Self-pay | Admitting: Vascular Surgery

## 2015-08-07 DIAGNOSIS — D631 Anemia in chronic kidney disease: Secondary | ICD-10-CM | POA: Diagnosis not present

## 2015-08-07 DIAGNOSIS — N2581 Secondary hyperparathyroidism of renal origin: Secondary | ICD-10-CM | POA: Diagnosis not present

## 2015-08-07 DIAGNOSIS — D509 Iron deficiency anemia, unspecified: Secondary | ICD-10-CM | POA: Diagnosis not present

## 2015-08-07 DIAGNOSIS — N186 End stage renal disease: Secondary | ICD-10-CM | POA: Diagnosis not present

## 2015-08-09 ENCOUNTER — Ambulatory Visit: Payer: Self-pay | Admitting: *Deleted

## 2015-08-09 ENCOUNTER — Ambulatory Visit (INDEPENDENT_AMBULATORY_CARE_PROVIDER_SITE_OTHER): Payer: Medicare Other | Admitting: Infectious Diseases

## 2015-08-09 ENCOUNTER — Encounter: Payer: Self-pay | Admitting: Infectious Diseases

## 2015-08-09 VITALS — BP 116/82 | HR 109 | Temp 98.7°F | Ht 69.0 in | Wt 114.0 lb

## 2015-08-09 DIAGNOSIS — Z992 Dependence on renal dialysis: Secondary | ICD-10-CM | POA: Diagnosis not present

## 2015-08-09 DIAGNOSIS — B20 Human immunodeficiency virus [HIV] disease: Secondary | ICD-10-CM

## 2015-08-09 DIAGNOSIS — B029 Zoster without complications: Secondary | ICD-10-CM | POA: Diagnosis not present

## 2015-08-09 DIAGNOSIS — N186 End stage renal disease: Secondary | ICD-10-CM | POA: Diagnosis not present

## 2015-08-09 DIAGNOSIS — Z9114 Patient's other noncompliance with medication regimen: Secondary | ICD-10-CM

## 2015-08-09 MED ORDER — DOLUTEGRAVIR SODIUM 50 MG PO TABS: 50.0000 mg | ORAL_TABLET | Freq: Two times a day (BID) | ORAL | Status: DC

## 2015-08-09 MED ORDER — VALACYCLOVIR HCL 500 MG PO TABS: 500.0000 mg | ORAL_TABLET | Freq: Every day | ORAL | Status: DC

## 2015-08-09 MED ORDER — TENOFOVIR DISOPROXIL FUMARATE 300 MG PO TABS: 300.0000 mg | ORAL_TABLET | ORAL | Status: DC

## 2015-08-09 MED ORDER — LOPINAVIR-RITONAVIR 400-100 MG/5ML PO SOLN: 10.0000 mL | Freq: Every day | ORAL | Status: DC

## 2015-08-09 MED ORDER — RENA-VITE PO TABS: 1.0000 | ORAL_TABLET | Freq: Every day | ORAL | Status: DC

## 2015-08-09 MED ORDER — ZIDOVUDINE 100 MG PO CAPS: 300.0000 mg | ORAL_CAPSULE | Freq: Every day | ORAL | Status: DC

## 2015-08-09 NOTE — Assessment & Plan Note (Signed)
Will refill her vtx for 5 days.

## 2015-08-09 NOTE — Assessment & Plan Note (Signed)
Greatly appreciate her HD f/u.  She has +bruit and her site is clean.

## 2015-08-09 NOTE — Progress Notes (Signed)
   Subjective:    Patient ID: Paula Becker, female    DOB: 1975-04-03, 40 y.o.   MRN: [id number]  HPI 40 yo F with HIV/AIDS, multidrug resistant virus, poor adherence/compliance. Also with recurrent MRSA furuncles, HSV. CRI/FSGS.  She returned 03-10-15 in acute renal failure and reuiqred HD. She underwent basilic vein transposition 2-14 and also temp HD line placement. Her ART was changed to HD dosing. She was d/c home on 2-16.   Has been off her ART. KLT/DTGV/TFV/AZT  Today would like to restart her ART.  She does not want to work with Karn Pickler or [name]  Has been feeling poorly- has a "bug" bite on her L wrist. Has been having headaches, sores on her bottom.   HIV 1 RNA QUANT (copies/mL)  Date Value  02/07/2015 20700  09/30/2014 103*  10/22/2013 1105*   CD4 T CELL ABS (/uL)  Date Value  02/07/2015 40*  09/30/2014 60*  05/19/2013 60*   Has been going to HD T, TH, Sat.   Review of Systems     Objective:   Physical Exam  Constitutional: She appears well-developed and well-nourished.  HENT:  Mouth/Throat: No oropharyngeal exudate.  Eyes: EOM are normal. Pupils are equal, round, and reactive to light.  Neck: Neck supple.  Cardiovascular: Normal rate, regular rhythm and normal heart sounds.   Pulmonary/Chest: Effort normal and breath sounds normal.  Abdominal: Soft. Bowel sounds are normal. There is no tenderness. There is no rebound.  Lymphadenopathy:    She has no cervical adenopathy.  Skin:          Assessment & Plan:

## 2015-08-09 NOTE — Assessment & Plan Note (Signed)
She is meeting with pharm She is going to meet with Ambre Will restart her ART Will see her back in  Months.

## 2015-08-09 NOTE — Progress Notes (Signed)
HPI: Paula Becker is a 40 y.o. female with long history of noncompliance with her HIV medications and is here to see Dr. Johnnye Sima for follow-up.  She isn't feel well at all and says she is ready to be committed to taking medications for her HIV again.  Allergies: Allergies  Allergen Reactions  . Shellfish-Derived Products Swelling    Facial swelling  . Vancomycin Rash    Vitals: Temp: 98.7 F (37.1 C) (07/10 1153) Temp Source: Oral (07/10 1153) BP: 116/82 mmHg (07/10 1153) Pulse Rate: 109 (07/10 1153)  Past Medical History: Past Medical History  Diagnosis Date  . HIV (human immunodeficiency virus infection) ([place])   . MRSA (methicillin resistant staph aureus) culture positive   . Necrotizing pneumonia ([place]) 07/2010  . Pneumonia 06/23/11    RLL patchy, nodular lung disease  . Anemia of chronic disease   . History of noncompliance with medical treatment   . Herpes     Social History: Social History   Social History  . Marital Status: Single    Spouse Name: N/A  . Number of Children: N/A  . Years of Education: N/A   Social History Main Topics  . Smoking status: Never Smoker   . Smokeless tobacco: Never Used  . Alcohol Use: No  . Drug Use: No  . Sexual Activity: Not Asked     Comment: declined condoms   Other Topics Concern  . None   Social History Narrative   Lives in [place] in her house.   Has support from her Dad for doctor visits.   Didn't work ever. Not working now.    Previous Regimen:  Current Regimen:   Labs: HIV 1 RNA QUANT (copies/mL)  Date Value  02/07/2015 20700  09/30/2014 103*  10/22/2013 1105*   CD4 T CELL ABS (/uL)  Date Value  02/07/2015 40*  09/30/2014 60*  05/19/2013 60*   HEP B S AB (no units)  Date Value  03/10/2015 Non Reactive   HEPATITIS B SURFACE AG (no units)  Date Value  03/10/2015 Negative   HCV AB (no units)  Date Value  04/15/2010 NEGATIVE    CrCl: CrCl cannot be calculated (Patient has no serum creatinine  result on file.).  Lipids:    Component Value Date/Time   CHOL 120 02/17/2011 1254   TRIG 119 02/17/2011 1254   HDL 27* 02/17/2011 1254   CHOLHDL 4.4 02/17/2011 1254   VLDL 24 02/17/2011 1254   LDLCALC 69 02/17/2011 1254    Assessment: Spoke to [name] regarding her HIV medications.  She tells me she is committed and ready to start taking them again.  I explained to her that her virus is fairly resistant now and if she decides to restart medications that she needs to be fully committed or it could get worse.  She states that she will take them daily.  She does not want to take any pills unless she can crush them.  We will put her back on a salvage liquid/crushable pill regimen in hopes she can tolerate it.   Recommendations: - Restart tenofovir 300 mg PO once weekly - liquid - Restart zidovudine 300 mg PO once daily - liquid - Restart dolutegravir 50 mg PO twice daily - can crush - Restart lopinavir/ritonavir 400-100 mg/5 mL - 10 mLs PO daily with breakfast  Cassie L. Donnajean Lopes, PharmD [facility] 08/09/2015, 12:31 PM

## 2015-08-10 ENCOUNTER — Ambulatory Visit: Payer: Self-pay | Admitting: Family

## 2015-08-10 DIAGNOSIS — N2581 Secondary hyperparathyroidism of renal origin: Secondary | ICD-10-CM | POA: Diagnosis not present

## 2015-08-10 DIAGNOSIS — D631 Anemia in chronic kidney disease: Secondary | ICD-10-CM | POA: Diagnosis not present

## 2015-08-10 DIAGNOSIS — D509 Iron deficiency anemia, unspecified: Secondary | ICD-10-CM | POA: Diagnosis not present

## 2015-08-10 DIAGNOSIS — N186 End stage renal disease: Secondary | ICD-10-CM | POA: Diagnosis not present

## 2015-08-11 ENCOUNTER — Ambulatory Visit: Payer: Self-pay | Admitting: Vascular Surgery

## 2015-08-12 ENCOUNTER — Other Ambulatory Visit: Payer: Self-pay

## 2015-08-12 DIAGNOSIS — D509 Iron deficiency anemia, unspecified: Secondary | ICD-10-CM | POA: Diagnosis not present

## 2015-08-12 DIAGNOSIS — N2581 Secondary hyperparathyroidism of renal origin: Secondary | ICD-10-CM | POA: Diagnosis not present

## 2015-08-12 DIAGNOSIS — N186 End stage renal disease: Secondary | ICD-10-CM | POA: Diagnosis not present

## 2015-08-12 DIAGNOSIS — D631 Anemia in chronic kidney disease: Secondary | ICD-10-CM | POA: Diagnosis not present

## 2015-08-13 ENCOUNTER — Ambulatory Visit: Payer: Medicare Other | Admitting: *Deleted

## 2015-08-13 ENCOUNTER — Encounter: Payer: Self-pay | Admitting: *Deleted

## 2015-08-13 VITALS — BP 120/77 | HR 120 | Temp 99.7°F

## 2015-08-13 DIAGNOSIS — Z9114 Patient's other noncompliance with medication regimen: Secondary | ICD-10-CM

## 2015-08-13 DIAGNOSIS — B20 Human immunodeficiency virus [HIV] disease: Secondary | ICD-10-CM

## 2015-08-14 DIAGNOSIS — N186 End stage renal disease: Secondary | ICD-10-CM | POA: Diagnosis not present

## 2015-08-14 DIAGNOSIS — D509 Iron deficiency anemia, unspecified: Secondary | ICD-10-CM | POA: Diagnosis not present

## 2015-08-14 DIAGNOSIS — D631 Anemia in chronic kidney disease: Secondary | ICD-10-CM | POA: Diagnosis not present

## 2015-08-14 DIAGNOSIS — N2581 Secondary hyperparathyroidism of renal origin: Secondary | ICD-10-CM | POA: Diagnosis not present

## 2015-08-17 ENCOUNTER — Other Ambulatory Visit (HOSPITAL_COMMUNITY): Payer: Self-pay | Admitting: *Deleted

## 2015-08-17 DIAGNOSIS — N186 End stage renal disease: Secondary | ICD-10-CM | POA: Diagnosis not present

## 2015-08-17 DIAGNOSIS — N2581 Secondary hyperparathyroidism of renal origin: Secondary | ICD-10-CM | POA: Diagnosis not present

## 2015-08-17 DIAGNOSIS — D631 Anemia in chronic kidney disease: Secondary | ICD-10-CM | POA: Diagnosis not present

## 2015-08-17 DIAGNOSIS — D509 Iron deficiency anemia, unspecified: Secondary | ICD-10-CM | POA: Diagnosis not present

## 2015-08-18 ENCOUNTER — Ambulatory Visit: Payer: Self-pay

## 2015-08-18 ENCOUNTER — Ambulatory Visit (HOSPITAL_COMMUNITY)
Admission: RE | Admit: 2015-08-18 | Discharge: 2015-08-18 | Disposition: A | Discharge status: Home / Self Care | Payer: Medicare Other | Source: Ambulatory Visit | Attending: Vascular Surgery | Admitting: Vascular Surgery

## 2015-08-18 DIAGNOSIS — N186 End stage renal disease: Secondary | ICD-10-CM | POA: Insufficient documentation

## 2015-08-18 DIAGNOSIS — N185 Chronic kidney disease, stage 5: Secondary | ICD-10-CM | POA: Diagnosis not present

## 2015-08-18 DIAGNOSIS — Z881 Allergy status to other antibiotic agents status: Secondary | ICD-10-CM | POA: Diagnosis not present

## 2015-08-18 DIAGNOSIS — B20 Human immunodeficiency virus [HIV] disease: Secondary | ICD-10-CM | POA: Insufficient documentation

## 2015-08-18 DIAGNOSIS — Z4901 Encounter for fitting and adjustment of extracorporeal dialysis catheter: Secondary | ICD-10-CM | POA: Diagnosis not present

## 2015-08-18 DIAGNOSIS — Z79899 Other long term (current) drug therapy: Secondary | ICD-10-CM | POA: Diagnosis not present

## 2015-08-18 DIAGNOSIS — Z8701 Personal history of pneumonia (recurrent): Secondary | ICD-10-CM | POA: Insufficient documentation

## 2015-08-18 MED ORDER — LIDOCAINE HCL (PF) 2 % IJ SOLN
INTRAMUSCULAR | Status: AC
  Filled 2015-08-18: qty 10

## 2015-08-18 NOTE — Progress Notes (Signed)
[facility] SHORT STAY H&P  CC:  Catheter removal   HPI:  This is a 40 y.o. female here for right IJ tunneled dialysis catheter removal.  The patient has a functioning left upper arm basilic vein transposition. She dialyzes on TTS.    Past Medical History  Diagnosis Date  . HIV (human immunodeficiency virus infection) ([place])   . MRSA (methicillin resistant staph aureus) culture positive   . Necrotizing pneumonia ([place]) 07/2010  . Pneumonia 06/23/11    RLL patchy, nodular lung disease  . Anemia of chronic disease   . History of noncompliance with medical treatment   . Herpes     FH:  Non-Contributory  Social History   Social History  . Marital Status: Single    Spouse Name: N/A  . Number of Children: N/A  . Years of Education: N/A   Occupational History  . Not on file.   Social History Main Topics  . Smoking status: Never Smoker   . Smokeless tobacco: Never Used  . Alcohol Use: No  . Drug Use: No  . Sexual Activity: Not on file     Comment: declined condoms   Other Topics Concern  . Not on file   Social History Narrative   Lives in [place] in her house.   Has support from her Dad for doctor visits.   Didn't work ever. Not working now.    Allergies  Allergen Reactions  . Shellfish-Derived Products Swelling    Facial swelling  . Vancomycin Rash    Current Outpatient Prescriptions  Medication Sig Dispense Refill  . alum & mag hydroxide-simeth (MAALOX/MYLANTA) 200-200-20 MG/5ML suspension Take 30 mLs by mouth every 6 (six) hours as needed for indigestion, heartburn or flatulence. (Patient not taking: Reported on 08/09/2015) 355 mL 0  . Darbepoetin Alfa (ARANESP) 100 MCG/0.5ML SOSY injection Inject 0.5 mLs (100 mcg total) into the vein every Thursday with hemodialysis. 4.2 mL   . dolutegravir (TIVICAY) 50 MG tablet Take 1 tablet (50 mg total) by mouth 2 (two) times daily. 60 tablet 0  . doxercalciferol (HECTOROL) 4 MCG/2ML injection Inject 1 mL (2  mcg total) into the vein Every Tuesday,Thursday,and Saturday with dialysis. 2 mL   . feeding supplement (BOOST / RESOURCE BREEZE) LIQD Take 1 Container by mouth 3 (three) times daily between meals. (Patient not taking: Reported on 05/26/2015) 237 mL 0  . fluconazole (DIFLUCAN) 100 MG tablet Take 100 mg by mouth daily. Reported on 08/09/2015  0  . gabapentin (NEURONTIN) 300 MG capsule TAKE 1 CAPSULE BY MOUTH THREE TIMES DAILY (Patient not taking: Reported on 08/09/2015) 60 capsule 2  . hydrOXYzine (ATARAX/VISTARIL) 25 MG tablet Take 1 tablet (25 mg total) by mouth every 6 (six) hours. (Patient not taking: Reported on 08/09/2015) 20 tablet 0  . lopinavir-ritonavir (KALETRA) 400-100 MG/5ML solution Take 10 mLs (800 mg total) by mouth daily with breakfast. 3320 mL 3  . multivitamin (RENA-VIT) TABS tablet Take 1 tablet by mouth at bedtime.  0  . sulfamethoxazole-trimethoprim (BACTRIM) 400-80 MG/5ML injection Inject 10 mLs (160 mg of trimethoprim total) into the vein every Monday, Wednesday, and Friday. (Patient not taking: Reported on 08/09/2015) 300 mL 3  . tenofovir (VIREAD) 300 MG tablet Take 1 tablet (300 mg total) by mouth once a week. 30 tablet 0  . valACYclovir (VALTREX) 500 MG tablet Take 1 tablet (500 mg total) by mouth daily. 21 tablet 0  . zidovudine (RETROVIR) 100 MG capsule Take 3 capsules (300 mg total)  by mouth daily with breakfast. 90 capsule 0   Current Facility-Administered Medications  Medication Dose Route Frequency Provider Last Rate Last Dose  . lidocaine (XYLOCAINE) 2 % injection             ROS:  See HPI  PHYSICAL EXAM  Filed Vitals:   08/18/15 1046  BP: 125/83  Pulse: 110  Temp: 98.4 F (36.9 C)  Resp: 20    Gen:  Well developed well nourished HEENT:  normocephalic Neck:  Right IJ TDC Lungs:  Non-labored Extremities:  Left upper arm AVF with palpable thrill  Skin:  No obvious rashes Neuro: No focal deficits.   Lab/X-ray:  Impression: This is a 40 y.o. female  here for diatek catheter removal  Plan:  Removal of right diatek catheter  Virgina Jock, PA-C [facility] [phone] 08/18/2015 11:20 AM

## 2015-08-18 NOTE — Progress Notes (Signed)
  [facility] Catheter Removal Procedure Note  Diagnosis: ESRD  Plan:  Remove right diatek catheter  Consent signed:  Yes.   Time out completed:  Yes.   Coumadin:  No. PT/INR (if applicable):   Other labs:  Procedure: 1.  Sterile prepping and draping over catheter area 2. 0 ml 2% lidocaine plain instilled at removal site. Patient is afraid of needles and did not want local anesthesia.  3.  right catheter removed in its entirety with cuff in tact. 4.  Complications:  none 5. Tip of catheter sent for culture:  No.   Patient tolerated procedure well:  Yes.   Pressure held, no bleeding noted, dressing applied Instructions given to the pt regarding wound care and bleeding.   Virgina Jock, PA-C 08/18/2015 11:21 AM

## 2015-08-18 NOTE — Progress Notes (Signed)
Right chest dressing CDI upon DC home and patient without complaints

## 2015-08-20 DIAGNOSIS — D631 Anemia in chronic kidney disease: Secondary | ICD-10-CM | POA: Diagnosis not present

## 2015-08-20 DIAGNOSIS — N2581 Secondary hyperparathyroidism of renal origin: Secondary | ICD-10-CM | POA: Diagnosis not present

## 2015-08-20 DIAGNOSIS — D509 Iron deficiency anemia, unspecified: Secondary | ICD-10-CM | POA: Diagnosis not present

## 2015-08-20 DIAGNOSIS — N186 End stage renal disease: Secondary | ICD-10-CM | POA: Diagnosis not present

## 2015-08-24 DIAGNOSIS — N2581 Secondary hyperparathyroidism of renal origin: Secondary | ICD-10-CM | POA: Diagnosis not present

## 2015-08-24 DIAGNOSIS — D509 Iron deficiency anemia, unspecified: Secondary | ICD-10-CM | POA: Diagnosis not present

## 2015-08-24 DIAGNOSIS — D631 Anemia in chronic kidney disease: Secondary | ICD-10-CM | POA: Diagnosis not present

## 2015-08-24 DIAGNOSIS — N186 End stage renal disease: Secondary | ICD-10-CM | POA: Diagnosis not present

## 2015-08-26 DIAGNOSIS — D509 Iron deficiency anemia, unspecified: Secondary | ICD-10-CM | POA: Diagnosis not present

## 2015-08-26 DIAGNOSIS — N2581 Secondary hyperparathyroidism of renal origin: Secondary | ICD-10-CM | POA: Diagnosis not present

## 2015-08-26 DIAGNOSIS — D631 Anemia in chronic kidney disease: Secondary | ICD-10-CM | POA: Diagnosis not present

## 2015-08-26 DIAGNOSIS — N186 End stage renal disease: Secondary | ICD-10-CM | POA: Diagnosis not present

## 2015-08-30 ENCOUNTER — Telehealth: Payer: Self-pay | Admitting: *Deleted

## 2015-08-30 DIAGNOSIS — N041 Nephrotic syndrome with focal and segmental glomerular lesions: Secondary | ICD-10-CM | POA: Diagnosis not present

## 2015-08-30 DIAGNOSIS — Z992 Dependence on renal dialysis: Secondary | ICD-10-CM | POA: Diagnosis not present

## 2015-08-30 DIAGNOSIS — N186 End stage renal disease: Secondary | ICD-10-CM | POA: Diagnosis not present

## 2015-08-31 ENCOUNTER — Telehealth: Payer: Self-pay | Admitting: *Deleted

## 2015-08-31 DIAGNOSIS — N186 End stage renal disease: Secondary | ICD-10-CM | POA: Diagnosis not present

## 2015-08-31 DIAGNOSIS — N2581 Secondary hyperparathyroidism of renal origin: Secondary | ICD-10-CM | POA: Diagnosis not present

## 2015-08-31 DIAGNOSIS — D509 Iron deficiency anemia, unspecified: Secondary | ICD-10-CM | POA: Diagnosis not present

## 2015-08-31 DIAGNOSIS — D631 Anemia in chronic kidney disease: Secondary | ICD-10-CM | POA: Diagnosis not present

## 2015-08-31 NOTE — Telephone Encounter (Signed)
Not feeling well, unable to sleep at night.  [facility] does not have any appointments until 09/07/15.  RN advised the pt to go to an urgent care center if she feels she needs to be seen sooner than next week.  Pt verbalized that she would go to an urgent care for treatment.

## 2015-09-03 ENCOUNTER — Other Ambulatory Visit: Payer: Self-pay | Admitting: Infectious Diseases

## 2015-09-03 DIAGNOSIS — B20 Human immunodeficiency virus [HIV] disease: Secondary | ICD-10-CM

## 2015-09-03 NOTE — Progress Notes (Signed)
RN meet with the patient during her Clinic Visit with Paula Becker. During over conversation Paula Becker stated she still would not like for me to come to her home but she will come by the clinic to meet with me and discuss her medications. RN invited Paula Becker into the room to confirm our upcoming plans. Next visit planned for this Friday since [name] has a full schedule during this week

## 2015-09-03 NOTE — Patient Instructions (Signed)
Please refer to progress note for details of this visit 

## 2015-09-08 ENCOUNTER — Telehealth: Payer: Self-pay | Admitting: *Deleted

## 2015-09-08 NOTE — Telephone Encounter (Signed)
RN contacted [name] to arrange a home visit. [name] stated she has not been feeling well. Rn asked if I could come by to do a nursing assessment. Paula Becker declined stating she has not been able to get any sleep and would like to go back to sleep. [name] stated she will give me a call tomorrow

## 2015-09-09 DIAGNOSIS — R0982 Postnasal drip: Secondary | ICD-10-CM | POA: Diagnosis not present

## 2015-09-09 DIAGNOSIS — H6980 Other specified disorders of Eustachian tube, unspecified ear: Secondary | ICD-10-CM | POA: Diagnosis not present

## 2015-09-09 DIAGNOSIS — R05 Cough: Secondary | ICD-10-CM | POA: Diagnosis not present

## 2015-09-20 ENCOUNTER — Inpatient Hospital Stay (HOSPITAL_COMMUNITY): Admission: RE | Admit: 2015-09-20 | Payer: Self-pay | Source: Ambulatory Visit

## 2015-09-20 ENCOUNTER — Telehealth: Payer: Self-pay | Admitting: *Deleted

## 2015-09-20 NOTE — Progress Notes (Signed)
RN meet wit the patient at the clinic for a conjoined visit with pharmacy(Cassie) to discuss Paula Becker's medication regimen. Paula Becker appears very weak, fragile and malnourished. She states the Kaletra continues to give her diarrhea. Together we discussed her current regimen and her inabilty to swallow larger medications. RN advised Dr hatcher of the patient's vitals and appearance. She does appear to be dehydrated. During today's visit I discussed with Paula Becker her level of motivation to take her medications and if she is not committed then we can support her in whatever decision she chooses to make(in relation to her health). Paula Becker acknowledged a understanding. Cassie/pharmacist explained to [facility] that her regimen options are limited and she asked if Paula Becker could hang in there with the Kaletra. RN assessed and noted that Imodium is less than 2 dollars at [facility]. RN gave Paula Becker 50 cent to cover the cost of the Imodium. Paula Becker asked of she oculd just rest in the clinic for a while before standing to go home. Rn assisted Paula Becker with getting as comfortable as possible while she rested. RN got 4 packs of instant oatmeal for Paula Becker as well per her request. After resting about 15 minutes RN assisted Paula Becker to the care and showed her exactly where [place] is located.

## 2015-09-20 NOTE — Patient Instructions (Signed)
Please refer to progress note for the details of today's visit 

## 2015-09-20 NOTE — Telephone Encounter (Signed)
RN messaged the patient stating" I would love to see you to check on how you are doing. I just want to be there for you. I know it is early but I would love to see you some time soon. Please let me know if that is possible"

## 2015-09-23 ENCOUNTER — Telehealth: Payer: Self-pay | Admitting: *Deleted

## 2015-09-23 NOTE — Telephone Encounter (Signed)
Patient walked into clinic c/o herpes outbreak on her buttocks. Asked if she wanted a refill on the valcyclovir and she said she can not take that because it upsets her stomach. Is there something else that she can be prescribed? Myrtis Hopping

## 2015-09-24 NOTE — Telephone Encounter (Signed)
Patient notfied

## 2015-09-24 NOTE — Telephone Encounter (Signed)
There is not Acyclovir would most likely give her the same problem

## 2015-09-29 ENCOUNTER — Inpatient Hospital Stay (HOSPITAL_COMMUNITY)
Admission: EM | Admit: 2015-09-29 | Discharge: 2015-10-03 | DRG: 974 | Disposition: A | Discharge status: Home / Self Care | Payer: Medicare Other | Attending: Internal Medicine | Admitting: Internal Medicine

## 2015-09-29 ENCOUNTER — Encounter (HOSPITAL_COMMUNITY): Payer: Self-pay

## 2015-09-29 ENCOUNTER — Emergency Department (HOSPITAL_COMMUNITY): Payer: Medicare Other

## 2015-09-29 DIAGNOSIS — I120 Hypertensive chronic kidney disease with stage 5 chronic kidney disease or end stage renal disease: Secondary | ICD-10-CM | POA: Diagnosis present

## 2015-09-29 DIAGNOSIS — B029 Zoster without complications: Secondary | ICD-10-CM | POA: Diagnosis present

## 2015-09-29 DIAGNOSIS — J189 Pneumonia, unspecified organism: Principal | ICD-10-CM | POA: Insufficient documentation

## 2015-09-29 DIAGNOSIS — D61818 Other pancytopenia: Secondary | ICD-10-CM | POA: Diagnosis present

## 2015-09-29 DIAGNOSIS — B20 Human immunodeficiency virus [HIV] disease: Secondary | ICD-10-CM | POA: Diagnosis present

## 2015-09-29 DIAGNOSIS — E8889 Other specified metabolic disorders: Secondary | ICD-10-CM | POA: Diagnosis present

## 2015-09-29 DIAGNOSIS — N2581 Secondary hyperparathyroidism of renal origin: Secondary | ICD-10-CM | POA: Diagnosis present

## 2015-09-29 DIAGNOSIS — Y95 Nosocomial condition: Secondary | ICD-10-CM | POA: Diagnosis present

## 2015-09-29 DIAGNOSIS — N186 End stage renal disease: Secondary | ICD-10-CM | POA: Diagnosis present

## 2015-09-29 DIAGNOSIS — F329 Major depressive disorder, single episode, unspecified: Secondary | ICD-10-CM | POA: Diagnosis present

## 2015-09-29 DIAGNOSIS — Z992 Dependence on renal dialysis: Secondary | ICD-10-CM | POA: Diagnosis not present

## 2015-09-29 DIAGNOSIS — J9811 Atelectasis: Secondary | ICD-10-CM | POA: Diagnosis present

## 2015-09-29 DIAGNOSIS — Z9114 Patient's other noncompliance with medication regimen: Secondary | ICD-10-CM | POA: Diagnosis not present

## 2015-09-29 DIAGNOSIS — Z9119 Patient's noncompliance with other medical treatment and regimen: Secondary | ICD-10-CM

## 2015-09-29 DIAGNOSIS — A609 Anogenital herpesviral infection, unspecified: Secondary | ICD-10-CM | POA: Diagnosis not present

## 2015-09-29 DIAGNOSIS — Z1624 Resistance to multiple antibiotics: Secondary | ICD-10-CM | POA: Diagnosis present

## 2015-09-29 DIAGNOSIS — D638 Anemia in other chronic diseases classified elsewhere: Secondary | ICD-10-CM

## 2015-09-29 DIAGNOSIS — R05 Cough: Secondary | ICD-10-CM | POA: Diagnosis present

## 2015-09-29 DIAGNOSIS — N041 Nephrotic syndrome with focal and segmental glomerular lesions: Secondary | ICD-10-CM | POA: Diagnosis not present

## 2015-09-29 DIAGNOSIS — D631 Anemia in chronic kidney disease: Secondary | ICD-10-CM | POA: Diagnosis not present

## 2015-09-29 HISTORY — DX: Disorder of kidney and ureter, unspecified: N28.9

## 2015-09-29 HISTORY — DX: Dependence on renal dialysis: N18.6

## 2015-09-29 HISTORY — DX: End stage renal disease: Z99.2

## 2015-09-29 LAB — CBC WITH DIFFERENTIAL/PLATELET
Basophils Absolute: 0 K/uL (ref 0.0–0.1)
Basophils Relative: 0 %
Eosinophils Absolute: 0.1 K/uL (ref 0.0–0.7)
Eosinophils Relative: 3 %
HCT: 28.9 % — ABNORMAL LOW (ref 36.0–46.0)
Hemoglobin: 8 g/dL — ABNORMAL LOW (ref 12.0–15.0)
Lymphocytes Relative: 15 %
Lymphs Abs: 0.5 K/uL — ABNORMAL LOW (ref 0.7–4.0)
MCH: 25.5 pg — ABNORMAL LOW (ref 26.0–34.0)
MCHC: 27.7 g/dL — ABNORMAL LOW (ref 30.0–36.0)
MCV: 92 fL (ref 78.0–100.0)
Monocytes Absolute: 0.6 K/uL (ref 0.1–1.0)
Monocytes Relative: 20 %
Neutro Abs: 1.8 K/uL (ref 1.7–7.7)
Neutrophils Relative %: 62 %
Platelets: 127 K/uL — ABNORMAL LOW (ref 150–400)
RBC: 3.14 MIL/uL — ABNORMAL LOW (ref 3.87–5.11)
RDW: 18 % — ABNORMAL HIGH (ref 11.5–15.5)
WBC: 3 K/uL — ABNORMAL LOW (ref 4.0–10.5)

## 2015-09-29 LAB — BASIC METABOLIC PANEL WITH GFR
Anion gap: 8 (ref 5–15)
BUN: 21 mg/dL — ABNORMAL HIGH (ref 6–20)
CO2: 31 mmol/L (ref 22–32)
Calcium: 8.2 mg/dL — ABNORMAL LOW (ref 8.9–10.3)
Chloride: 100 mmol/L — ABNORMAL LOW (ref 101–111)
Creatinine, Ser: 5.95 mg/dL — ABNORMAL HIGH (ref 0.44–1.00)
GFR calc Af Amer: 9 mL/min — ABNORMAL LOW
GFR calc non Af Amer: 8 mL/min — ABNORMAL LOW
Glucose, Bld: 80 mg/dL (ref 65–99)
Potassium: 3.9 mmol/L (ref 3.5–5.1)
Sodium: 139 mmol/L (ref 135–145)

## 2015-09-29 MED ORDER — SODIUM CHLORIDE 0.9 % IV SOLN
INTRAVENOUS | Status: DC
  Administered 2015-09-30: 09:00:00 via INTRAVENOUS

## 2015-09-29 MED ORDER — DOXERCALCIFEROL 4 MCG/2ML IV SOLN: 2.0000 ug | INTRAVENOUS | Status: DC

## 2015-09-29 MED ORDER — BOOST / RESOURCE BREEZE PO LIQD
1.0000 | Freq: Three times a day (TID) | ORAL | Status: DC
  Administered 2015-09-29 – 2015-09-30 (×3): 1 via ORAL

## 2015-09-29 MED ORDER — LEVOFLOXACIN IN D5W 500 MG/100ML IV SOLN: 500.0000 mg | INTRAVENOUS | Status: DC

## 2015-09-29 MED ORDER — HEPARIN SODIUM (PORCINE) 5000 UNIT/ML IJ SOLN
5000.0000 [IU] | Freq: Three times a day (TID) | INTRAMUSCULAR | Status: DC
  Filled 2015-09-29: qty 1

## 2015-09-29 MED ORDER — ONDANSETRON HCL 4 MG/2ML IJ SOLN: 4.0000 mg | Freq: Four times a day (QID) | INTRAMUSCULAR | Status: DC | PRN

## 2015-09-29 MED ORDER — GUAIFENESIN ER 600 MG PO TB12
600.0000 mg | ORAL_TABLET | Freq: Two times a day (BID) | ORAL | Status: DC
  Administered 2015-09-30: 600 mg via ORAL
  Filled 2015-09-29 (×5): qty 1

## 2015-09-29 MED ORDER — POLYETHYLENE GLYCOL 3350 17 G PO PACK: 17.0000 g | PACK | Freq: Every day | ORAL | Status: DC | PRN

## 2015-09-29 MED ORDER — TRAZODONE HCL 50 MG PO TABS: 25.0000 mg | ORAL_TABLET | Freq: Every evening | ORAL | Status: DC | PRN

## 2015-09-29 MED ORDER — SODIUM CHLORIDE 0.9 % IV BOLUS (SEPSIS)
1000.0000 mL | Freq: Once | INTRAVENOUS | Status: AC
  Administered 2015-09-29: 1000 mL via INTRAVENOUS

## 2015-09-29 MED ORDER — DARBEPOETIN ALFA 100 MCG/0.5ML IJ SOSY: 100.0000 ug | PREFILLED_SYRINGE | INTRAMUSCULAR | Status: DC

## 2015-09-29 MED ORDER — ONDANSETRON HCL 4 MG PO TABS: 4.0000 mg | ORAL_TABLET | Freq: Four times a day (QID) | ORAL | Status: DC | PRN

## 2015-09-29 MED ORDER — ALBUTEROL SULFATE (2.5 MG/3ML) 0.083% IN NEBU: 2.5000 mg | INHALATION_SOLUTION | RESPIRATORY_TRACT | Status: DC | PRN

## 2015-09-29 MED ORDER — LEVOFLOXACIN IN D5W 750 MG/150ML IV SOLN
750.0000 mg | Freq: Once | INTRAVENOUS | Status: AC
  Administered 2015-09-29: 750 mg via INTRAVENOUS
  Filled 2015-09-29: qty 150

## 2015-09-29 MED ORDER — BISACODYL 5 MG PO TBEC: 5.0000 mg | DELAYED_RELEASE_TABLET | Freq: Every day | ORAL | Status: DC | PRN

## 2015-09-29 MED ORDER — ACETAMINOPHEN 325 MG PO TABS
650.0000 mg | ORAL_TABLET | Freq: Four times a day (QID) | ORAL | Status: DC | PRN
  Administered 2015-10-02: 650 mg via ORAL
  Filled 2015-09-29 (×3): qty 2

## 2015-09-29 MED ORDER — ACETAMINOPHEN 650 MG RE SUPP: 650.0000 mg | Freq: Four times a day (QID) | RECTAL | Status: DC | PRN

## 2015-09-29 NOTE — Progress Notes (Signed)
Pharmacy Antibiotic Note Paula Becker is a 40 y.o. female admitted on 09/29/2015 with concern for PNA in setting of HIV, with long hx of noncompliance with medications, and ESRD.  Pharmacy has been consulted for Levaquin  dosing.  Plan: 1. Levaquin 750 mg x 1 now followed by 500 mg IV every 48 hours based on renal function.    Temp (24hrs), Avg:98.9 F (37.2 C), Min:98.9 F (37.2 C), Max:98.9 F (37.2 C)   Recent Labs Lab 09/29/15 1412  WBC 3.0*  CREATININE 5.95*    CrCl cannot be calculated (Unknown ideal weight.).    Allergies  Allergen Reactions  . Shellfish-Derived Products Swelling    Facial swelling  . Vancomycin Rash    Antimicrobials this admission: 8/30 Levaquin  >>    Microbiology results: 8/30 BCx: px   Thank you for allowing pharmacy to be a part of this patient's care.  Vincenza Hews, PharmD, BCPS 09/29/2015, 3:25 PM Pager: [phone]

## 2015-09-29 NOTE — ED Triage Notes (Signed)
Per pT, Pt is coming in with complaint of cough for one moth with green sputum. Pt reports being diagnosed with shingles and has rash in groin area that goes around her back. No rash is exposed at this time.

## 2015-09-29 NOTE — ED Provider Notes (Signed)
[facility] DEPT Provider Note   CSN: [id number] Arrival date & time: 09/29/15  1314     History   Chief Complaint Chief Complaint  Patient presents with  . Cough  . Rash    HPI Paula Becker is a 40 y.o. female with a pmhx of AIDS , Last CD4 count 40, CK D on hemodialysis, necrotizing PNA, who presents to the ED today c/o cough and rash. Pt states that she has been having a productive cough with green sputum for the last month. Pt has been taking OTC cough medications without relief of her symptoms. Pt also reports poor PO intake. She has not taken any of her AIDS medications in over 2 months. She denies any fevers or chills, chest pain or SOB. Pt also c/o shingle rash in left groin and buttock. Last HD session was yesterday. Pt is a T, Th, Sat dialysis pt.   HPI  Past Medical History:  Diagnosis Date  . Anemia of chronic disease   . Herpes   . History of noncompliance with medical treatment   . HIV (human immunodeficiency virus infection) ([place])   . MRSA (methicillin resistant staph aureus) culture positive   . Necrotizing pneumonia ([place]) 07/2010  . Pneumonia 06/23/11   RLL patchy, nodular lung disease    Patient Active Problem List   Diagnosis Date Noted  . 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 CKD (chronic kidney disease) stage V requiring chronic dialysis ([place])   . Dialysis patient ([place])   . Malnutrition of moderate degree 03/12/2015  . 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 Uremia   . Pressure ulcer 03/10/2015  . Vitamin D deficiency 03/10/2015  . Secondary hyperparathyroidism of renal origin ([place]) 03/10/2015  . Diarrhea 02/06/2015  . 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 Shingles 11/30/2014  . Back pain, chronic   . 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 Recurrent pneumonia 07/11/2011  . Noncompliance with medication treatment due to difficulty with route of medication administration 03/15/2011  .  Folliculitis [id number]  . Chronic cough 10/14/2010  . HSV (herpes simplex virus) anogenital infection 08/19/2010  . Anemia of chronic disease 05/31/2010  . Severe protein-calorie malnutrition ([place]) 05/31/2010  . Unspecified episodic mood disorder 05/31/2010  . Hypomagnesemia 05/31/2010  . Human immunodeficiency virus (HIV) disease ([place]) 04/08/2010    Past Surgical History:  Procedure Laterality Date  . BASCILIC VEIN TRANSPOSITION Left 03/16/2015   Procedure: BASCILIC VEIN TRANSPOSITION LEFT UPPER ARM;  Surgeon: Angelia Mould, MD;  Location: [facility];  Service: Vascular;  Laterality: Left;  . BREAST SURGERY  03/2010   right; "don't know what they did"  . CESAREAN SECTION  1998  . INSERTION OF DIALYSIS CATHETER N/A 03/16/2015   Procedure: INSERTION OF DIALYSIS CATHETER RIGHT INTERNAL JUGULAR;  Surgeon: Angelia Mould, MD;  Location: [facility];  Service: Vascular;  Laterality: N/A;  . VIDEO BRONCHOSCOPY  06/28/2011   Procedure: VIDEO BRONCHOSCOPY WITH FLUORO;  Surgeon: Chesley Mires, MD;  Location: [facility];  Service: Cardiopulmonary;  Laterality: Bilateral;    OB History    No data available       Home Medications    Prior to Admission medications   Medication Sig Start Date End Date Taking? Authorizing Provider  alum & mag hydroxide-simeth (MAALOX/MYLANTA) 200-200-20 MG/5ML suspension Take 30 mLs by mouth every 6 (six) hours as needed for indigestion, heartburn or flatulence. Patient not taking: Reported on 08/09/2015 02/11/15   Burgess Estelle, MD  Darbepoetin Alfa (  ARANESP) 100 MCG/0.5ML SOSY injection Inject 0.5 mLs (100 mcg total) into the vein every Thursday with hemodialysis. 03/17/15   Iline Oven, MD  dolutegravir (TIVICAY) 50 MG tablet Take 1 tablet (50 mg total) by mouth 2 (two) times daily. 08/09/15   Campbell Riches, MD  doxercalciferol (HECTOROL) 4 MCG/2ML injection Inject 1 mL (2 mcg total) into the vein Every Tuesday,Thursday,and Saturday with dialysis.  03/17/15   Iline Oven, MD  feeding supplement (BOOST / RESOURCE BREEZE) LIQD Take 1 Container by mouth 3 (three) times daily between meals. Patient not taking: Reported on 05/26/2015 03/17/15   Iline Oven, MD  fluconazole (DIFLUCAN) 100 MG tablet Take 100 mg by mouth daily. Reported on 08/09/2015 02/11/15   Historical Provider, MD  gabapentin (NEURONTIN) 300 MG capsule TAKE 1 CAPSULE BY MOUTH THREE TIMES DAILY Patient not taking: Reported on 08/09/2015 12/28/14   Campbell Riches, MD  hydrOXYzine (ATARAX/VISTARIL) 25 MG tablet Take 1 tablet (25 mg total) by mouth every 6 (six) hours. Patient not taking: Reported on 08/09/2015 11/27/14   Ashley Murrain, NP  lopinavir-ritonavir Vevelyn Francois) 400-100 MG/5ML solution Take 10 mLs (800 mg total) by mouth daily with breakfast. 08/09/15   Campbell Riches, MD  multivitamin (RENA-VIT) TABS tablet Take 1 tablet by mouth at bedtime. 08/09/15   Campbell Riches, MD  sulfamethoxazole-trimethoprim (BACTRIM) 400-80 MG/5ML injection Inject 10 mLs (160 mg of trimethoprim total) into the vein every Monday, Wednesday, and Friday. Patient not taking: Reported on 08/09/2015 06/09/15   Campbell Riches, MD  valACYclovir (VALTREX) 500 MG tablet Take 1 tablet (500 mg total) by mouth daily. 08/09/15   Campbell Riches, MD  VIREAD 300 MG tablet TAKE 1 TABLET BY MOUTH ONCE A WEEK 09/03/15   Campbell Riches, MD  zidovudine (RETROVIR) 100 MG capsule TAKE 3 CAPSULES(300 MG) BY MOUTH DAILY WITH BREAKFAST 09/03/15   Campbell Riches, MD    Family History Family History  Problem Relation Age of Onset  . Lung disease Mother     passed away at 39 with lung disease  . Hypertension Father     Social History Social History  Substance Use Topics  . Smoking status: Never Smoker  . Smokeless tobacco: Never Used  . Alcohol use No     Allergies   Shellfish-derived products and Vancomycin   Review of Systems Review of Systems  All other systems reviewed and are  negative.    Physical Exam Updated Vital Signs BP 125/90 (BP Location: Right Arm)   Pulse 111   Temp 98.9 F (37.2 C) (Oral)   Resp 18   LMP 01/16/2015   SpO2 100%   Physical Exam  Constitutional: She is oriented to person, place, and time. No distress.  Frail  HENT:  Head: Normocephalic and atraumatic.  Mouth/Throat: No oropharyngeal exudate.  Eyes: Conjunctivae and EOM are normal. Pupils are equal, round, and reactive to light. Right eye exhibits no discharge. Left eye exhibits no discharge. No scleral icterus.  Cardiovascular: Normal rate, regular rhythm, normal heart sounds and intact distal pulses.  Exam reveals no gallop and no friction rub.   No murmur heard. Pulmonary/Chest: Effort normal. No respiratory distress. She has no wheezes. She has no rales. She exhibits no tenderness.  crackles heard in lower lung fields  Abdominal: Soft. She exhibits no distension. There is no tenderness. There is no guarding.  Musculoskeletal: Normal range of motion. She exhibits no edema.  Neurological: She is alert and  oriented to person, place, and time.  Skin: Skin is warm and dry. Rash noted. She is not diaphoretic. No erythema. No pallor.  Vesicular, painful rash in left groin and left buttock  Psychiatric: She has a normal mood and affect. Her behavior is normal.  Nursing note and vitals reviewed.    ED Treatments / Results  Labs (all labs ordered are listed, but only abnormal results are displayed) Labs Reviewed  BASIC METABOLIC PANEL - Abnormal; Notable for the following:       Result Value   Chloride 100 (*)    BUN 21 (*)    Creatinine, Ser 5.95 (*)    Calcium 8.2 (*)    GFR calc non Af Amer 8 (*)    GFR calc Af Amer 9 (*)    All other components within normal limits  CBC WITH DIFFERENTIAL/PLATELET - Abnormal; Notable for the following:    WBC 3.0 (*)    RBC 3.14 (*)    Hemoglobin 8.0 (*)    HCT 28.9 (*)    MCH 25.5 (*)    MCHC 27.7 (*)    RDW 18.0 (*)     Platelets 127 (*)    Lymphs Abs 0.5 (*)    All other components within normal limits    EKG  EKG Interpretation None       Radiology Dg Chest 2 View  Result Date: 09/29/2015 CLINICAL DATA:  Cough productive of green sputum for 1 month, history shingles, AIDS, necrotizing pneumonia EXAM: CHEST  2 VIEW COMPARISON:  03/16/2015 FINDINGS: Normal heart size, mediastinal contours and pulmonary vascularity. Slight rotation of the chest to the RIGHT. Infiltrates at the lung bases bilaterally with mild volume loss and basilar atelectasis. Central peribronchial thickening. Upper lungs grossly clear. No definite pleural effusion or pneumothorax. Bones unremarkable. IMPRESSION: Bronchitic changes with bibasilar infiltrates and atelectasis. Electronically Signed   By: Lavonia Dana M.D.   On: 09/29/2015 15:02    Procedures Procedures (including critical care time)  Medications Ordered in ED Medications - No data to display   Initial Impression / Assessment and Plan / ED Course  I have reviewed the triage vital signs and the nursing notes.  Pertinent labs & imaging results that were available during my care of the patient were reviewed by me and considered in my medical decision making (see chart for details).  40 y.o F with a pmhx of AIDS, last CD4 count of 40, CKD on HD, necrotizing PNA who presents to the ED today c/o productive cough w/ green sputum x 1 month. On presentation to ED, pt appears chronically ill. She is afeberil. No sign of respiratory distress. Crackles heard on lung exam. Concern for PNA. Pt has hz of necrotizing and PCJ pna. WBC count low at 3. Pt has not taken her HIV medications in over 2 months. HGB also low, this appears to be baseline per chart review. CXR shows bilateral infiltrates at the lung bases b/l with atelectasis.    Clinical Course  Comment By Time  Spoke with pharmacy who recommends Levaquin.  Dondra [place], PA-C 08/30 1529   Given pts severe  immunocompromised state, recommend admission for IV abx. Do not suspect sepsis at this time. Will administer additional fluids and check rectal temp.  Pt also has remained untreated for ongoing shingles as she has refused to take her home Valtrex bc she states that is makes her nauseous. Last HD session was yesterday. Creatinine elevated 5.95. Pt does not appear to  be fluid overloaded.   Will consult hospitalist for admission.  Spoke with Nevin Bloodgood from [facility] hospital[facility] who will admit pt to their service. Admitting attending is Linna Darner, MD. Recommend ID consult.    Final Clinical Impressions(s) / ED Diagnoses   Final diagnoses:  Community acquired pneumonia    New Prescriptions New Prescriptions   No medications on file     Carlos Levering, PA-C 09/29/15 [place], PA-C 09/29/15 [place], MD 09/30/15 [phone]

## 2015-09-29 NOTE — H&P (Signed)
History and Physical    Paula Becker [id number] DOB: 24-Oct-1975 DOA: 09/29/2015  PCP: Bobby Rumpf, MD  Patient coming from:   Home Chief Complaint: cough  HPI: Paula Becker is a 40 y.o. female with a medical history significant for, but not  limited to, HIV / AIDS, depression, necrotizing PNA, MRSA furuncles. She had CKD with baseline Cr less than 2 until January of this year when she presented with acute kidney failure requiring dialysis. She is now dialyzing T / Th / Sat. She dialyzed yesterday.   Patient presented to the emergency department today with a three-week history of cough productive of green sputum. No chills. Her chest hurts when she coughs. Bibasilar infiltrates on chest x-ray. Patient has been off HIV meds over the last few months. States she hasn't had much food to eat at home to take the medications with.  She was apparently diagnosed with shingles of her buttocks a couple of months ago but did not take the Acyclovir because it upset her stomach.   ED Course:  Afebrile, normotensive, heart rate of 111. Normal oxygen saturation on room air Pancytopenic.  Last CD4 count in January was 86 Levaquin started  Review of Systems:  Occasional constipation. As per HPI, otherwise 10 point review of systems negative.    Past Medical History:  Diagnosis Date  . Anemia of chronic disease   . Herpes   . History of noncompliance with medical treatment   . HIV (human immunodeficiency virus infection) ([place])   . MRSA (methicillin resistant staph aureus) culture positive   . Necrotizing pneumonia ([place]) 07/2010  . Pneumonia 06/23/11   RLL patchy, nodular lung disease    Past Surgical History:  Procedure Laterality Date  . BASCILIC VEIN TRANSPOSITION Left 03/16/2015   Procedure: BASCILIC VEIN TRANSPOSITION LEFT UPPER ARM;  Surgeon: Angelia Mould, MD;  Location: [place];  Service: Vascular;  Laterality: Left;  . BREAST SURGERY  03/2010   right; "don't know what they did"    . CESAREAN SECTION  1998  . INSERTION OF DIALYSIS CATHETER N/A 03/16/2015   Procedure: INSERTION OF DIALYSIS CATHETER RIGHT INTERNAL JUGULAR;  Surgeon: Angelia Mould, MD;  Location: [place];  Service: Vascular;  Laterality: N/A;  . VIDEO BRONCHOSCOPY  06/28/2011   Procedure: VIDEO BRONCHOSCOPY WITH FLUORO;  Surgeon: Chesley Mires, MD;  Location: [place];  Service: Cardiopulmonary;  Laterality: Bilateral;    Social History   Social History  . Marital status: Single    Spouse name: N/A  . Number of children: N/A  . Years of education: N/A   Occupational History  . Not on file.   Social History Main Topics  . Smoking status: Never Smoker  . Smokeless tobacco: Never Used  . Alcohol use No  . Drug use: No  . Sexual activity: Not on file     Comment: declined condoms   Other Topics Concern  . Not on file   Social History Narrative   Lives in [place] in her house.   Has support from her Dad for doctor visits.   Didn't work ever. Not working now.   Patient currently lives at home with her father. She is moving into her own place next month. Her 38 year old son will reside with her. Ambulates without assistive device Allergies  Allergen Reactions  . Shellfish-Derived Products Swelling    Facial swelling  . Vancomycin Rash    Family History  Problem Relation Age of Onset  . Lung disease Mother  passed away at 37 with lung disease  . Hypertension Father    . Prior to Admission medications   Medication Sig Start Date End Date Taking? Authorizing Provider  Darbepoetin Alfa (ARANESP) 100 MCG/0.5ML SOSY injection Inject 0.5 mLs (100 mcg total) into the vein every Thursday with hemodialysis. 03/17/15  Yes Iline Oven, MD  dolutegravir (TIVICAY) 50 MG tablet Take 1 tablet (50 mg total) by mouth 2 (two) times daily. 08/09/15  Yes Campbell Riches, MD  doxercalciferol (HECTOROL) 4 MCG/2ML injection Inject 1 mL (2 mcg total) into the vein Every Tuesday,Thursday,and  Saturday with dialysis. 03/17/15  Yes Iline Oven, MD  feeding supplement (BOOST / RESOURCE BREEZE) LIQD Take 1 Container by mouth 3 (three) times daily between meals. 03/17/15  Yes Iline Oven, MD  ibuprofen (ADVIL,MOTRIN) 200 MG tablet Take 200 mg by mouth every 6 (six) hours as needed for moderate pain.   Yes Historical Provider, MD  lopinavir-ritonavir Vevelyn Francois) 400-100 MG/5ML solution Take 10 mLs (800 mg total) by mouth daily with breakfast. 08/09/15  Yes Campbell Riches, MD  VIREAD 300 MG tablet TAKE 1 TABLET BY MOUTH ONCE A WEEK 09/03/15  Yes Campbell Riches, MD  zidovudine (RETROVIR) 100 MG capsule TAKE 3 CAPSULES(300 MG) BY MOUTH DAILY WITH BREAKFAST 09/03/15  Yes Campbell Riches, MD  alum & mag hydroxide-simeth (MAALOX/MYLANTA) 200-200-20 MG/5ML suspension Take 30 mLs by mouth every 6 (six) hours as needed for indigestion, heartburn or flatulence. Patient not taking: Reported on 08/09/2015 02/11/15   Burgess Estelle, MD  gabapentin (NEURONTIN) 300 MG capsule TAKE 1 CAPSULE BY MOUTH THREE TIMES DAILY Patient not taking: Reported on 08/09/2015 12/28/14   Campbell Riches, MD  hydrOXYzine (ATARAX/VISTARIL) 25 MG tablet Take 1 tablet (25 mg total) by mouth every 6 (six) hours. Patient not taking: Reported on 08/09/2015 11/27/14   Ashley Murrain, NP  multivitamin (RENA-VIT) TABS tablet Take 1 tablet by mouth at bedtime. Patient not taking: Reported on 09/29/2015 08/09/15   Campbell Riches, MD  sulfamethoxazole-trimethoprim (BACTRIM) 400-80 MG/5ML injection Inject 10 mLs (160 mg of trimethoprim total) into the vein every Monday, Wednesday, and Friday. Patient not taking: Reported on 08/09/2015 06/09/15   Campbell Riches, MD  valACYclovir (VALTREX) 500 MG tablet Take 1 tablet (500 mg total) by mouth daily. Patient not taking: Reported on 09/29/2015 08/09/15   Campbell Riches, MD    Physical Exam: Vitals:   09/29/15 1326 09/29/15 1526 09/29/15 1530  BP: 125/90 133/91 127/91  Pulse: 111  102 104  Resp: 18    Temp: 98.9 F (37.2 C)    TempSrc: Oral    SpO2: 100% 96% 90%    Constitutional:  NAD, calm, comfortable Vitals:   09/29/15 1326 09/29/15 1526 09/29/15 1530  BP: 125/90 133/91 127/91  Pulse: 111 102 104  Resp: 18    Temp: 98.9 F (37.2 C)    TempSrc: Oral    SpO2: 100% 96% 90%   Eyes: PER, lids and conjunctivae normal ENMT: Mucous membranes are moist. Posterior pharynx clear of any exudate or lesions..  Neck: normal, supple, no masses Respiratory: clear to auscultation bilaterally, no wheezing, no crackles. Normal respiratory effort. No accessory muscle use.  Cardiovascular: Regular rate and rhythm, no murmurs / rubs / gallops. No extremity edema. 2+ dorsal pedis pulses.   Abdomen: no tenderness, no masses palpated. No hepatomegaly. Bowel sounds positive.  Musculoskeletal: no clubbing / cyanosis. No joint deformity upper and lower extremities. Good ROM,  no contractures. Normal muscle tone.  Skin: bilateral upper buttocks and coccyx with a few scattered, open lesions draining scant amount of bloody material.   Neurologic: CN 2-12 grossly intact. Sensation intact, Strength 5/5 in all 4.  Psychiatric: Normal judgment and insight. Alert and oriented x 3. Normal mood.   Labs on Admission: I have personally reviewed following labs and imaging studies  Radiological Exams on Admission: Dg Chest 2 View  Result Date: 09/29/2015 CLINICAL DATA:  Cough productive of green sputum for 1 month, history shingles, AIDS, necrotizing pneumonia EXAM: CHEST  2 VIEW COMPARISON:  03/16/2015 FINDINGS: Normal heart size, mediastinal contours and pulmonary vascularity. Slight rotation of the chest to the RIGHT. Infiltrates at the lung bases bilaterally with mild volume loss and basilar atelectasis. Central peribronchial thickening. Upper lungs grossly clear. No definite pleural effusion or pneumothorax. Bones unremarkable. IMPRESSION: Bronchitic changes with bibasilar infiltrates and  atelectasis. Electronically Signed   By: Lavonia Dana M.D.   On: 09/29/2015 15:02    Assessment/Plan   Active Problems:   CAP (community acquired pneumonia)      Productive cough, bibasilar infiltrates on CXR. She has HIV / AIDS and hx of necrotizing PNA and PCP.  -admit to medical bed for treatment of PNA -CAP order set utilized.  -EDP spoke with pharmacy who started Levaquin. Will continue Levaquin but  I have consulted ID to see if she needs additional coverage given pulmonary history.  -follow up on sputum gram stain, culture, strep pneumo urinary ag -prn 02   HIV/AIDS, multidrug resistant virus, long history of poor adherence/compliance with medications. Currently not taking her ART treatment.            -CD4, HIV quant -ID to see in am. Hopefully she will agree to resume HIV treatment  History of shingles. Apparently had an outbreak a couple of months ago on upper buttocks but couldn't tolerate treatment. Current buttock lesions don't look like shingles rather skin tears. Will ask wound care nurse to see. Duoderm for now  ESRD on HD Tu / Thu / Sat. Dialyzed yesterday. Will need to contact Nephrology in am for treatment.    DVT prophylaxis:     SQ Heparin    Code Status:     Full code   Family Communication: none Disposition Plan:   Discharge home in 3-4 days             Consults called:  Infectious Disease - Spoke with Dr. Linus Salmons  Admission status:   Admission -  Medical bed   Tye Savoy NP [facility] Pager [phone]  If 7PM-7AM, please contact night-coverage [email or web address] Password [email or web address]  09/29/2015, 5:16 PM

## 2015-09-30 ENCOUNTER — Telehealth: Payer: Self-pay | Admitting: *Deleted

## 2015-09-30 DIAGNOSIS — Z992 Dependence on renal dialysis: Secondary | ICD-10-CM

## 2015-09-30 DIAGNOSIS — N186 End stage renal disease: Secondary | ICD-10-CM

## 2015-09-30 DIAGNOSIS — A609 Anogenital herpesviral infection, unspecified: Secondary | ICD-10-CM

## 2015-09-30 DIAGNOSIS — Z9114 Patient's other noncompliance with medication regimen: Secondary | ICD-10-CM

## 2015-09-30 DIAGNOSIS — B20 Human immunodeficiency virus [HIV] disease: Secondary | ICD-10-CM

## 2015-09-30 DIAGNOSIS — J189 Pneumonia, unspecified organism: Principal | ICD-10-CM

## 2015-09-30 DIAGNOSIS — N041 Nephrotic syndrome with focal and segmental glomerular lesions: Secondary | ICD-10-CM | POA: Diagnosis not present

## 2015-09-30 LAB — CBC
HCT: 27.3 % — ABNORMAL LOW (ref 36.0–46.0)
Hemoglobin: 7.4 g/dL — ABNORMAL LOW (ref 12.0–15.0)
MCH: 24.7 pg — AB (ref 26.0–34.0)
MCHC: 27.1 g/dL — AB (ref 30.0–36.0)
MCV: 91.3 fL (ref 78.0–100.0)
PLATELETS: 136 10*3/uL — AB (ref 150–400)
RBC: 2.99 MIL/uL — ABNORMAL LOW (ref 3.87–5.11)
RDW: 17.6 % — ABNORMAL HIGH (ref 11.5–15.5)
WBC: 3.5 10*3/uL — ABNORMAL LOW (ref 4.0–10.5)

## 2015-09-30 LAB — T-HELPER CELLS (CD4) COUNT (NOT AT ARMC)
CD4 T CELL HELPER: 4 % — AB (ref 33–55)
CD4 T Cell Abs: 20 /uL — ABNORMAL LOW (ref 400–2700)

## 2015-09-30 LAB — HIV 1/2 AB DIFFERENTIATION
HIV 1 AB: POSITIVE — AB
HIV 2 AB: NEGATIVE

## 2015-09-30 LAB — BASIC METABOLIC PANEL
Anion gap: 11 (ref 5–15)
BUN: 28 mg/dL — AB (ref 6–20)
CO2: 29 mmol/L (ref 22–32)
CREATININE: 7.31 mg/dL — AB (ref 0.44–1.00)
Calcium: 8 mg/dL — ABNORMAL LOW (ref 8.9–10.3)
Chloride: 100 mmol/L — ABNORMAL LOW (ref 101–111)
GFR calc Af Amer: 7 mL/min — ABNORMAL LOW (ref >60)
GFR, EST NON AFRICAN AMERICAN: 6 mL/min — AB (ref >60)
GLUCOSE: 95 mg/dL (ref 65–99)
Potassium: 3.9 mmol/L (ref 3.5–5.1)
SODIUM: 140 mmol/L (ref 135–145)

## 2015-09-30 LAB — HIV ANTIBODY (ROUTINE TESTING W REFLEX)

## 2015-09-30 MED ORDER — DOXERCALCIFEROL 4 MCG/2ML IV SOLN: 3.0000 ug | INTRAVENOUS | Status: DC

## 2015-09-30 MED ORDER — DARBEPOETIN ALFA 200 MCG/0.4ML IJ SOSY
PREFILLED_SYRINGE | INTRAMUSCULAR | Status: AC
  Administered 2015-09-30: 200 ug via INTRAVENOUS
  Filled 2015-09-30: qty 0.4

## 2015-09-30 MED ORDER — HEPARIN SODIUM (PORCINE) 1000 UNIT/ML DIALYSIS: 1000.0000 [IU] | INTRAMUSCULAR | Status: DC | PRN

## 2015-09-30 MED ORDER — DIPHENHYDRAMINE HCL 25 MG PO CAPS: 25.0000 mg | ORAL_CAPSULE | Freq: Once | ORAL | Status: DC

## 2015-09-30 MED ORDER — RENA-VITE PO TABS
1.0000 | ORAL_TABLET | Freq: Every day | ORAL | Status: DC
  Filled 2015-09-30 (×2): qty 1

## 2015-09-30 MED ORDER — SODIUM CHLORIDE 0.9 % IV SOLN: 100.0000 mL | INTRAVENOUS | Status: DC | PRN

## 2015-09-30 MED ORDER — HEPARIN SODIUM (PORCINE) 1000 UNIT/ML DIALYSIS
20.0000 [IU]/kg | INTRAMUSCULAR | Status: DC | PRN
Start: 2015-09-30 — End: 2015-10-01

## 2015-09-30 MED ORDER — DIPHENHYDRAMINE HCL 50 MG/ML IJ SOLN
25.0000 mg | Freq: Once | INTRAMUSCULAR | Status: AC
Start: 2015-09-30 — End: 2015-09-30
  Administered 2015-09-30: 25 mg via INTRAVENOUS
  Filled 2015-09-30: qty 1

## 2015-09-30 MED ORDER — LIDOCAINE-PRILOCAINE 2.5-2.5 % EX CREA: 1.0000 "application " | TOPICAL_CREAM | CUTANEOUS | Status: DC | PRN

## 2015-09-30 MED ORDER — DARBEPOETIN ALFA 200 MCG/0.4ML IJ SOSY
200.0000 ug | PREFILLED_SYRINGE | INTRAMUSCULAR | Status: DC
  Administered 2015-09-30: 200 ug via INTRAVENOUS
  Filled 2015-09-30: qty 0.4

## 2015-09-30 MED ORDER — VANCOMYCIN HCL IN DEXTROSE 1-5 GM/200ML-% IV SOLN
INTRAVENOUS | Status: AC
  Filled 2015-09-30: qty 200

## 2015-09-30 MED ORDER — VANCOMYCIN HCL IN DEXTROSE 1-5 GM/200ML-% IV SOLN
1000.0000 mg | Freq: Once | INTRAVENOUS | Status: AC
  Administered 2015-09-30: 1000 mg via INTRAVENOUS
  Filled 2015-09-30: qty 200

## 2015-09-30 MED ORDER — LIDOCAINE HCL (PF) 1 % IJ SOLN: 5.0000 mL | INTRAMUSCULAR | Status: DC | PRN

## 2015-09-30 MED ORDER — NEPRO/CARBSTEADY PO LIQD: 237.0000 mL | Freq: Two times a day (BID) | ORAL | Status: DC

## 2015-09-30 MED ORDER — DEXTROSE 5 % IV SOLN
2.0000 g | Freq: Once | INTRAVENOUS | Status: AC
  Administered 2015-09-30: 2 g via INTRAVENOUS
  Filled 2015-09-30: qty 2

## 2015-09-30 MED ORDER — DIPHENHYDRAMINE HCL 25 MG PO CAPS
ORAL_CAPSULE | ORAL | Status: AC
  Filled 2015-09-30: qty 1

## 2015-09-30 MED ORDER — PENTAFLUOROPROP-TETRAFLUOROETH EX AERO: 1.0000 "application " | INHALATION_SPRAY | CUTANEOUS | Status: DC | PRN

## 2015-09-30 NOTE — Consult Note (Signed)
[facility] Renal Consultation Note    Indication for Consultation:  Management of ESRD/hemodialysis; anemia, hypertension/volume and secondary hyperparathyroidism  HPI: Paula Becker is a 40 y.o. 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 Her medical history is also significant for HIV/AIDS with poor ART compliance, h/o HSV anogential infection, recurrent MRSA infections, h/o necrotizing pneumonia, protein calorie malnutrition. She presented to ED yesterday with progressive cough and rash to buttocks. She reported to ED provider that she had not taken antiretroviral meds for over 2 months. ED course was significant for CXR with bronchitic changes with bibasilar infiltrates and atelectasis. She is admitted for further treatment.  She is seen at bedside and reports worsening cough x 4 weeks occasionally with green sputum. She reports having chills starting 2-3 days ago. She also reports painful rash to buttocks. Rash has been present for "months" becoming more painful and makes it difficult to sit. She was initially treated for shingles but reports the medicines upset her stomach. She also reports that she has not taken her HAART because of stomach upset/nausea. She reports poor appetite for several months. She denies SOB, chest pain, vomiting/diarrhea.  She receives HD at [facility] on [facility]. Her last treatment was on 8/29 for 3 hours. She left treatment 1kg above her EDW of 50.5kg. She is frequently non-compliant with treatments with several shortened and missed treatments. She reports some itching in her feet during treatment.    Past Medical History:  Diagnosis Date  . Anemia of chronic disease   . ESRD on dialysis ([place])   . Herpes   . History of noncompliance with medical treatment   . HIV (human immunodeficiency virus infection) ([place])   . MRSA (methicillin resistant staph aureus) culture positive   . Necrotizing pneumonia ([place])  07/2010  . Pneumonia 06/23/11   RLL patchy, nodular lung disease  . Renal disorder    Past Surgical History:  Procedure Laterality Date  . BASCILIC VEIN TRANSPOSITION Left 03/16/2015   Procedure: BASCILIC VEIN TRANSPOSITION LEFT UPPER ARM;  Surgeon: Angelia Mould, MD;  Location: [place];  Service: Vascular;  Laterality: Left;  . BREAST SURGERY  03/2010   right; "don't know what they did"  . CESAREAN SECTION  1998  . INSERTION OF DIALYSIS CATHETER N/A 03/16/2015   Procedure: INSERTION OF DIALYSIS CATHETER RIGHT INTERNAL JUGULAR;  Surgeon: Angelia Mould, MD;  Location: [place];  Service: Vascular;  Laterality: N/A;  . VIDEO BRONCHOSCOPY  06/28/2011   Procedure: VIDEO BRONCHOSCOPY WITH FLUORO;  Surgeon: Chesley Mires, MD;  Location: [place];  Service: Cardiopulmonary;  Laterality: Bilateral;   Family History  Problem Relation Age of Onset  . Lung disease Mother     passed away at 25 with lung disease  . Hypertension Father    Social History:  reports that she has never smoked. She has never used smokeless tobacco. She reports that she does not drink alcohol or use drugs. Allergies  Allergen Reactions  . Shellfish-Derived Products Swelling    Facial swelling  . Vancomycin Rash   Prior to Admission medications   Medication Sig Start Date End Date Taking? Authorizing Provider  Darbepoetin Alfa (ARANESP) 100 MCG/0.5ML SOSY injection Inject 0.5 mLs (100 mcg total) into the vein every Thursday with hemodialysis. 03/17/15  Yes Iline Oven, MD  dolutegravir (TIVICAY) 50 MG tablet Take 1 tablet (50 mg total) by mouth 2 (two) times daily. 08/09/15  Yes Campbell Riches, MD  doxercalciferol (  HECTOROL) 4 MCG/2ML injection Inject 1 mL (2 mcg total) into the vein Every Tuesday,Thursday,and Saturday with dialysis. 03/17/15  Yes Iline Oven, MD  feeding supplement (BOOST / RESOURCE BREEZE) LIQD Take 1 Container by mouth 3 (three) times daily between meals. 03/17/15  Yes Iline Oven, MD  ibuprofen (ADVIL,MOTRIN) 200 MG tablet Take 200 mg by mouth every 6 (six) hours as needed for moderate pain.   Yes Historical Provider, MD  lopinavir-ritonavir Vevelyn Francois) 400-100 MG/5ML solution Take 10 mLs (800 mg total) by mouth daily with breakfast. 08/09/15  Yes Campbell Riches, MD  VIREAD 300 MG tablet TAKE 1 TABLET BY MOUTH ONCE A WEEK 09/03/15  Yes Campbell Riches, MD  zidovudine (RETROVIR) 100 MG capsule TAKE 3 CAPSULES(300 MG) BY MOUTH DAILY WITH BREAKFAST 09/03/15  Yes Campbell Riches, MD  alum & mag hydroxide-simeth (MAALOX/MYLANTA) 200-200-20 MG/5ML suspension Take 30 mLs by mouth every 6 (six) hours as needed for indigestion, heartburn or flatulence. Patient not taking: Reported on 08/09/2015 02/11/15   Burgess Estelle, MD  gabapentin (NEURONTIN) 300 MG capsule TAKE 1 CAPSULE BY MOUTH THREE TIMES DAILY Patient not taking: Reported on 08/09/2015 12/28/14   Campbell Riches, MD  hydrOXYzine (ATARAX/VISTARIL) 25 MG tablet Take 1 tablet (25 mg total) by mouth every 6 (six) hours. Patient not taking: Reported on 08/09/2015 11/27/14   Ashley Murrain, NP  multivitamin (RENA-VIT) TABS tablet Take 1 tablet by mouth at bedtime. Patient not taking: Reported on 09/29/2015 08/09/15   Campbell Riches, MD  sulfamethoxazole-trimethoprim (BACTRIM) 400-80 MG/5ML injection Inject 10 mLs (160 mg of trimethoprim total) into the vein every Monday, Wednesday, and Friday. Patient not taking: Reported on 08/09/2015 06/09/15   Campbell Riches, MD  valACYclovir (VALTREX) 500 MG tablet Take 1 tablet (500 mg total) by mouth daily. Patient not taking: Reported on 09/29/2015 08/09/15   Campbell Riches, MD   Current Facility-Administered Medications  Medication Dose Route Frequency Provider Last Rate Last Dose  . 0.9 %  sodium chloride infusion   Intravenous Continuous Willia Craze, NP 75 mL/hr at 09/30/15 0911    . acetaminophen (TYLENOL) tablet 650 mg  650 mg Oral Q6H PRN Willia Craze, NP       Or   . acetaminophen (TYLENOL) suppository 650 mg  650 mg Rectal Q6H PRN Willia Craze, NP      . albuterol (PROVENTIL) (2.5 MG/3ML) 0.083% nebulizer solution 2.5 mg  2.5 mg Nebulization Q2H PRN Willia Craze, NP      . bisacodyl (DULCOLAX) EC tablet 5 mg  5 mg Oral Daily PRN Willia Craze, NP      . ceFEPIme (MAXIPIME) 2 g in dextrose 5 % 50 mL IVPB  2 g Intravenous Once Michel Bickers, MD      . diphenhydrAMINE (BENADRYL) capsule 25 mg  25 mg Oral Once Michel Bickers, MD       And  . vancomycin (VANCOCIN) IVPB 1000 mg/200 mL premix  1,000 mg Intravenous Once Michel Bickers, MD      . feeding supplement (BOOST / RESOURCE BREEZE) liquid 1 Container  1 Container Oral TID BM Willia Craze, NP   1 Container at 09/30/15 1218  . guaiFENesin (MUCINEX) 12 hr tablet 600 mg  600 mg Oral BID Willia Craze, NP   600 mg at 09/30/15 1217  . heparin injection 5,000 Units  5,000 Units Subcutaneous Q8H Willia Craze, NP      .  ondansetron (ZOFRAN) tablet 4 mg  4 mg Oral Q6H PRN Willia Craze, NP       Or  . ondansetron [facility]) injection 4 mg  4 mg Intravenous Q6H PRN Willia Craze, NP      . polyethylene glycol (MIRALAX / GLYCOLAX) packet 17 g  17 g Oral Daily PRN Willia Craze, NP      . traZODone (DESYREL) tablet 25 mg  25 mg Oral QHS PRN Willia Craze, NP       Labs: Basic Metabolic Panel:  Recent Labs Lab 09/29/15 1412 09/30/15 0407  NA 139 140  K 3.9 3.9  CL 100* 100*  CO2 31 29  GLUCOSE 80 95  BUN 21* 28*  CREATININE 5.95* 7.31*  CALCIUM 8.2* 8.0*   Liver Function Tests: No results for input(s): AST, ALT, ALKPHOS, BILITOT, PROT, ALBUMIN in the last 168 hours. No results for input(s): LIPASE, AMYLASE in the last 168 hours. No results for input(s): AMMONIA in the last 168 hours. CBC:  Recent Labs Lab 09/29/15 1412 09/30/15 0407  WBC 3.0* 3.5*  NEUTROABS 1.8  --   HGB 8.0* 7.4*  HCT 28.9* 27.3*  MCV 92.0 91.3  PLT 127* 136*   Cardiac Enzymes: No results  for input(s): CKTOTAL, CKMB, CKMBINDEX, TROPONINI in the last 168 hours. CBG: No results for input(s): GLUCAP in the last 168 hours. Iron Studies: No results for input(s): IRON, TIBC, TRANSFERRIN, FERRITIN in the last 72 hours. Studies/Results: Dg Chest 2 View  Result Date: 09/29/2015 CLINICAL DATA:  Cough productive of green sputum for 1 month, history shingles, AIDS, necrotizing pneumonia EXAM: CHEST  2 VIEW COMPARISON:  03/16/2015 FINDINGS: Normal heart size, mediastinal contours and pulmonary vascularity. Slight rotation of the chest to the RIGHT. Infiltrates at the lung bases bilaterally with mild volume loss and basilar atelectasis. Central peribronchial thickening. Upper lungs grossly clear. No definite pleural effusion or pneumothorax. Bones unremarkable. IMPRESSION: Bronchitic changes with bibasilar infiltrates and atelectasis. Electronically Signed   By: Lavonia Dana M.D.   On: 09/29/2015 15:02    ROS: As per HPI otherwise negative.  Physical Exam: Vitals:   09/29/15 1838 09/29/15 2126 09/30/15 0600 09/30/15 0927  BP: 126/83 133/73 135/85 137/75  Pulse: 100 99 (!) 107 94  Resp: 16 18 16 18   Temp: 99.2 F (37.3 C) 99 F (37.2 C) 98.9 F (37.2 C) 99.1 F (37.3 C)  TempSrc: Oral Oral Oral Oral  SpO2: 100% 98% 99% 98%  Weight: 51.1 kg (112 lb 10.5 oz) 52.1 kg (114 lb 13.8 oz)    Height: 5\' 8"  (1.727 m)        General: Cachectic chronically ill appearing female NAD Head: NCAT sclera not icteric MMM Neck: Supple.  Lungs: CTA bilaterally without wheezes, rales, or rhonchi. Breathing is unlabored. Heart: RRR with S1 S2.  Abdomen: soft NT + BS Lower extremities:without edema or ischemic changes, no open wounds  Neuro: A & O  X 3.  Skin: scattered ulcerative lesions to buttocks - no dermatomal distribution Psych:  Responds to questions appropriately with a normal affect. Dialysis Access: LUE AVF +thrill   Dialysis Orders:  [facility] 4 h 180 F 350/A 1.5x  2K/2Ca EDW 50.5kg LUE  AVF 1700 U Heparin Hectorol 3 mcg IV q treatment Mircera 225 mcg IV q 2 weeks (last dose 8/19) OP Labs: Hgb 7.9  Tsat 26% K 5.2 Ca 8.4 P 6.0 iPTH 329  Assessment/Plan: 1.  Cough CXR with bibasilar infiltrates -  per primary. ID following - on IV Vanc/cefepime(? vanc allergy give with Benadryl) - pneumocystis DFA, sputum cx pending 2.  ESRD -  TTS for HD today on schedule K+ 3.9 - run 4K bath 3.  Hypertension/volume  - BP controlled - OP BP readings usually 120s no edema today - for HD on schedule UF goal 2L 4.  Anemia  - Hgb 7.4  - recent ESA ^ after not receiving for 3 weeks - due for next ESA dose on 9/2 - will place orders to give today with HD 5.  Metabolic bone disease -  Ca 8.0 - on VDRA/ trouble swallowing pills had Renvela packets - check P with renal panel tomorrow 6.  Nutrition - Alb 2.5 poor PO intake - renal diet/vitamins/protein supplement 7. HIV/AIDS - ID following poor OP compliance with HAART 8. Rash - per primary h/o HSV anogenital infection   Lynnda Child, PA-C [facility] 09/30/2015, 12:36 PM    Renal Attending: I agree with note as articulated above by Ms Lorine Bears.  Pt has LRI in setting of HIV and ESRD.  Will support with maintenance HD and attention to renal related issues. Kenlie Seki C

## 2015-09-30 NOTE — Consult Note (Addendum)
[facility] for Infectious Disease    Date of Admission:  09/29/2015          Reason for Consult: Healthcare associated pneumonia in the setting of advanced, untreated HIV infection    Referring Physician: Dr. Linna Darner  Principal Problem:   HCAP (healthcare-associated pneumonia) Active Problems:   HIV disease ([place])   ESRD on dialysis ([place])   HSV (herpes simplex virus) anogenital infection   Noncompliance with medication treatment due to difficulty with route of medication administration   Secondary hyperparathyroidism of renal origin ([place])   . Darbepoetin Alfa  100 mcg Intravenous Q Thu-HD  . doxercalciferol  2 mcg Intravenous Q T,Th,Sa-HD  . feeding supplement  1 Container Oral TID BM  . guaiFENesin  600 mg Oral BID  . heparin  5,000 Units Subcutaneous Q8H    Recommendations: 1. Start vancomycin and cefepime 2. Attempt to get expectorated sputum for Gram stain, routine culture and pneumocystis stain 3. Serum LDH 4. I will ask our infectious disease pharmacist to talk to her about her antiretroviral regimen  5. Discontinue airborne precautions  Assessment: Paula Becker probably has healthcare associated pneumonia. Her O2 sats on room air at rest are normal. She is not describing progressive shortness of breath suggesting pneumocystis but I will see if we can get sputum for routine staining cultures and pneumocystis DFA. I do not think she is likely to have tuberculosis and will take her out of airborne precautions. Her past QuantiFERON was indeterminate and her clinical and radiographic illness is not typical of tuberculosis. If she had had tuberculosis for the past month she would be much sicker and her chest x-ray would look much worse. She has not been febrile here but I think pneumonia is more likely than simple volume overload. She appears to be at dry weight.  The bottom line here though is that she still is having great difficulty taking her  antiretroviral therapy. I will ask our infectious disease pharmacist to review her current regimen to see if there is anything we can do to make it more tolerable for her.  I do not think that the lesions on her buttocks are shingles. Given the bilateral nature it is more likely they're due to a recurrence of her general herpes. At this point I do not think therapy will be very beneficial.   HPI: Paula Becker is a 40 y.o. female with HIV and end-stage renal disease. She has had a great deal of difficulty staying on her antiretroviral medications and her last CD4 count in January was 40 and her HIV viral load was 20,700. She is supposed to be taking a salvage regimen consisting of liquid Retrovir, Viread and Kaletra along with a crushed Tivicay tablet. She has not been taking her medication. She tells me that the Kaletra makes her sick on her stomach. She states that she has had a loose nonproductive cough for the past month. She has felt hot but has not taken her temperature. She denies feeling short of breath but says that she feels very weak. It seems like she is at her dry weight. She denies missing any of her hemodialysis sessions.   Review of Systems: Review of Systems  Constitutional: Positive for fever and malaise/fatigue. Negative for chills, diaphoresis and weight loss.  HENT: Negative for sore throat.   Respiratory: Positive for cough. Negative for sputum production and shortness of breath.   Cardiovascular: Negative for chest pain.  Gastrointestinal: Negative for abdominal pain, diarrhea, heartburn, nausea and vomiting.  Musculoskeletal: Negative for joint pain and myalgias.  Skin: Positive for rash.       She says that she recently had shingles with sores on both buttocks.  Neurological: Negative for dizziness and headaches.    Past Medical History:  Diagnosis Date  . Anemia of chronic disease   . ESRD on dialysis ([place])   . Herpes   . History of noncompliance with medical  treatment   . HIV (human immunodeficiency virus infection) ([place])   . MRSA (methicillin resistant staph aureus) culture positive   . Necrotizing pneumonia ([place]) 07/2010  . Pneumonia 06/23/11   RLL patchy, nodular lung disease  . Renal disorder     Social History  Substance Use Topics  . Smoking status: Never Smoker  . Smokeless tobacco: Never Used  . Alcohol use No    Family History  Problem Relation Age of Onset  . Lung disease Mother     passed away at 55 with lung disease  . Hypertension Father    Allergies  Allergen Reactions  . Shellfish-Derived Products Swelling    Facial swelling  . Vancomycin Rash    OBJECTIVE: Blood pressure 137/75, pulse 94, temperature 99.1 F (37.3 C), temperature source Oral, resp. rate 18, height 5\' 8"  (1.727 m), weight 114 lb 13.8 oz (52.1 kg), last menstrual period 01/16/2015, SpO2 98 %.  Physical Exam  Constitutional: She is oriented to person, place, and time.  She is resting quietly in bed. She has thin and speaks very softly.  HENT:  Mouth/Throat: No oropharyngeal exudate.  Eyes: Conjunctivae are normal.  Neck: Neck supple.  Cardiovascular: Normal rate and regular rhythm.   No murmur heard. Pulmonary/Chest: Effort normal. She has rales.  Few crackles in lung bases posteriorly. She has a frequent loose cough during the exam.  Abdominal: Soft. She exhibits no mass. There is no tenderness.  Musculoskeletal: Normal range of motion. She exhibits no edema or tenderness.  Neurological: She is alert and oriented to person, place, and time.  Skin: Rash noted.  Scattered ulcers, some scabbing over on buttocks.  Psychiatric: Mood and affect normal.    Lab Results Lab Results  Component Value Date   WBC 3.5 (L) 09/30/2015   HGB 7.4 (L) 09/30/2015   HCT 27.3 (L) 09/30/2015   MCV 91.3 09/30/2015   PLT 136 (L) 09/30/2015    Lab Results  Component Value Date   CREATININE 7.31 (H) 09/30/2015   BUN 28 (H) 09/30/2015   NA 140 09/30/2015     K 3.9 09/30/2015   CL 100 (L) 09/30/2015   CO2 29 09/30/2015    Lab Results  Component Value Date   ALT 8 (L) 03/12/2015   AST 17 03/12/2015   ALKPHOS 113 03/12/2015   BILITOT 0.3 03/12/2015    HIV 1 RNA Quant (copies/mL)  Date Value  02/07/2015 20,700  09/30/2014 103 (H)  10/22/2013 1,105 (H)   CD4 T Cell Abs (/uL)  Date Value  02/07/2015 40 (L)  09/30/2014 60 (L)  05/19/2013 60 (L)   Microbiology: No results found for this or any previous visit (from the past 240 hour(s)).  Chest x-ray 09/29/2015  IMPRESSION: Bronchitic changes with bibasilar infiltrates and atelectasis.    By: Lavonia Dana M.D.   On: 09/29/2015 15:02  Michel Bickers, [place] [phone] pager   [phone] cell 09/30/2015, 10:12 AM

## 2015-09-30 NOTE — Procedures (Signed)
I was present at this dialysis session. I have reviewed the session itself and made appropriate changes.   Filed Weights   09/29/15 1838 09/29/15 2126 09/30/15 1419  Weight: 51.1 kg (112 lb 10.5 oz) 52.1 kg (114 lb 13.8 oz) 55.3 kg (122 lb)     Recent Labs Lab 09/30/15 0407  NA 140  K 3.9  CL 100*  CO2 29  GLUCOSE 95  BUN 28*  CREATININE 7.31*  CALCIUM 8.0*     Recent Labs Lab 09/29/15 1412 09/30/15 0407  WBC 3.0* 3.5*  NEUTROABS 1.8  --   HGB 8.0* 7.4*  HCT 28.9* 27.3*  MCV 92.0 91.3  PLT 127* 136*    Scheduled Meds: . ceFEPime (MAXIPIME) IV  2 g Intravenous Once  . darbepoetin (ARANESP) injection - DIALYSIS  200 mcg Intravenous Q Thu-HD  . diphenhydrAMINE  25 mg Oral Once   And  . vancomycin  1,000 mg Intravenous Once  . diphenhydrAMINE  25 mg Intravenous Once  . [START ON 10/02/2015] doxercalciferol  3 mcg Intravenous Q T,Th,Sa-HD  . feeding supplement  1 Container Oral TID BM  . feeding supplement (NEPRO CARB STEADY)  237 mL Oral BID BM  . guaiFENesin  600 mg Oral BID  . heparin  5,000 Units Subcutaneous Q8H  . multivitamin  1 tablet Oral QHS  . vancomycin       Continuous Infusions: . sodium chloride 75 mL/hr at 09/30/15 0911   PRN Meds:.sodium chloride, sodium chloride, acetaminophen **OR** acetaminophen, albuterol, bisacodyl, heparin, heparin, lidocaine (PF), lidocaine-prilocaine, ondansetron **OR** ondansetron (ZOFRAN) IV, pentafluoroprop-tetrafluoroeth, polyethylene glycol, traZODone   Donetta Potts,  MD 09/30/2015, 4:28 PM

## 2015-09-30 NOTE — Progress Notes (Signed)
[facility]                                                                              Patient Demographics  Paula Becker, is a 40 y.o. female, DOB - Apr 08, 1975, [id number]  Admit date - 09/29/2015   Admitting Physician Waldemar Dickens, MD  Outpatient Primary MD for the patient is Bobby Rumpf, MD  Outpatient specialists:   LOS - 1  days    Chief Complaint  Patient presents with  . Cough  . Rash       Brief summary    Paula Becker is a 40 y.o. female with a medical history significant for, but not  limited to, HIV / AIDS, depression, necrotizing PNA, MRSA furuncles. She had CKD with baseline Cr less than 2 until January of this year when she presented with acute kidney failure requiring dialysis. She is now dialyzing T / Th / Sat. Patient presented to the emergency department today with a three-week history of cough productive of green sputum, pleuritic chest pain. Bibasilar infiltrates on chest x-ray. Patient has been off HIV meds over the last few months. States she hasn't had much food to eat at home to take the medications with.  She was apparently diagnosed with shingles of her buttocks a couple of months ago but did not take the Acyclovir because it upset her stomach.  ED Course:  Afebrile, normotensive, heart rate of 111. Normal oxygen saturation on room air. Pancytopenic.  Last CD4 count in January was 40   Assessment & Plan   HCAP/ healthcare associated pneumonia  HIV / AIDS and hx of necrotizing PNA and PCP.  -ID consulted, recommended to start vancomycin, cefepime -  obtain sputum cultures, pneumocystis stain, serum LDH   HIV/AIDS, multidrug resistant virus, long history of poor adherence/compliance with medications. Currently not taking her ART treatment.            -CD4, HIV quant -ID consult obtained, management per ID  History of shingles. Apparently had an outbreak a couple of months ago on upper buttocks but couldn't tolerate  treatment. Current buttock lesions don't look like shingles rather skin tears. - wound care - DC airborne precautions  ESRD on HD Tu / Thu / Sat.  - Nephrology consulted  Code Status: full DVT Prophylaxis:  heparin Family Communication: Discussed in detail with the patient, all imaging results, lab results explained to the patient   Disposition Plan:   Time Spent in minutes 25 minutes  Procedures:  none  Consultants:   Infectious disease Nephrology  Antimicrobials:   IV cefepime 8/31>  IV vancomycin 8/31>  Medications  Scheduled Meds: . ceFEPime (MAXIPIME) IV  2 g Intravenous Once  . darbepoetin (ARANESP) injection - DIALYSIS  200 mcg Intravenous Q Thu-HD  . diphenhydrAMINE  25 mg Oral Once   And  . vancomycin  1,000 mg Intravenous Once  . [START ON 10/02/2015] doxercalciferol  3 mcg Intravenous Q T,Th,Sa-HD  . feeding supplement  1 Container Oral TID BM  . guaiFENesin  600 mg Oral BID  . heparin  5,000 Units Subcutaneous Q8H   Continuous Infusions: . sodium  chloride 75 mL/hr at 09/30/15 0911   PRN Meds:.acetaminophen **OR** acetaminophen, albuterol, bisacodyl, ondansetron **OR** ondansetron (ZOFRAN) IV, polyethylene glycol, traZODone   Antibiotics   Anti-infectives    Start     Dose/Rate Route Frequency Ordered Stop   10/01/15 1600  levofloxacin (LEVAQUIN) IVPB 500 mg  Status:  Discontinued     500 mg 100 mL/hr over 60 Minutes Intravenous Every 48 hours 09/29/15 1523 09/30/15 1011   09/30/15 1600  vancomycin (VANCOCIN) IVPB 1000 mg/200 mL premix     1,000 mg 200 mL/hr over 60 Minutes Intravenous  Once 09/30/15 1157     09/30/15 1130  ceFEPIme (MAXIPIME) 2 g in dextrose 5 % 50 mL IVPB     2 g 100 mL/hr over 30 Minutes Intravenous  Once 09/30/15 1036     09/29/15 1530  levofloxacin (LEVAQUIN) IVPB 750 mg     750 mg 100 mL/hr over 90 Minutes Intravenous  Once 09/29/15 1522 09/29/15 1735        Subjective:   Annslee Plotts was seen and examined today.  Feeling tired and fatigued, weak otherwise no fevers or chills. Patient denies dizziness, chest pain, shortness of breath, abdominal pain, N/V/D/C. No acute events overnight.    Objective:   Vitals:   09/29/15 1838 09/29/15 2126 09/30/15 0600 09/30/15 0927  BP: 126/83 133/73 135/85 137/75  Pulse: 100 99 (!) 107 94  Resp: 16 18 16 18   Temp: 99.2 F (37.3 C) 99 F (37.2 C) 98.9 F (37.2 C) 99.1 F (37.3 C)  TempSrc: Oral Oral Oral Oral  SpO2: 100% 98% 99% 98%  Weight: 51.1 kg (112 lb 10.5 oz) 52.1 kg (114 lb 13.8 oz)    Height: 5\' 8"  (1.727 m)       Intake/Output Summary (Last 24 hours) at 09/30/15 1356 Last data filed at 09/30/15 1323  Gross per 24 hour  Intake             1060 ml  Output                0 ml  Net             1060 ml     Wt Readings from Last 3 Encounters:  09/29/15 52.1 kg (114 lb 13.8 oz)  08/18/15 52.2 kg (115 lb)  08/09/15 51.7 kg (114 lb)     Exam  General: Alert and oriented x 3, NAD  HEENT:    Neck: Supple, no JVD  Cardiovascular: S1 S2 clear, RRR  Respiratory: Decreased breath sound at the bases  Gastrointestinal: Soft, nontender, nondistended, + bowel sounds  Ext: no cyanosis clubbing or edema  Neuro: No new deficits  Skin: No rashes  Psych: Normal affect and demeanor, alert and oriented x3    Data Reviewed:  I have personally reviewed following labs and imaging studies  Micro Results No results found for this or any previous visit (from the past 240 hour(s)).  Radiology Reports Dg Chest 2 View  Result Date: 09/29/2015 CLINICAL DATA:  Cough productive of green sputum for 1 month, history shingles, AIDS, necrotizing pneumonia EXAM: CHEST  2 VIEW COMPARISON:  03/16/2015 FINDINGS: Normal heart size, mediastinal contours and pulmonary vascularity. Slight rotation of the chest to the RIGHT. Infiltrates at the lung bases bilaterally with mild volume loss and basilar atelectasis. Central peribronchial thickening. Upper lungs grossly  clear. No definite pleural effusion or pneumothorax. Bones unremarkable. IMPRESSION: Bronchitic changes with bibasilar infiltrates and atelectasis. Electronically Signed   By: Elta Guadeloupe  Thornton Papas M.D.   On: 09/29/2015 15:02    Lab Data:  CBC:  Recent Labs Lab 09/29/15 1412 09/30/15 0407  WBC 3.0* 3.5*  NEUTROABS 1.8  --   HGB 8.0* 7.4*  HCT 28.9* 27.3*  MCV 92.0 91.3  PLT 127* [id number]*   Basic Metabolic Panel:  Recent Labs Lab 09/29/15 1412 09/30/15 0407  NA 139 140  K 3.9 3.9  CL 100* 100*  CO2 31 29  GLUCOSE 80 95  BUN 21* 28*  CREATININE 5.95* 7.31*  CALCIUM 8.2* 8.0*   GFR: Estimated Creatinine Clearance: 8.3 mL/min (by C-G formula based on SCr of 7.31 mg/dL). Liver Function Tests: No results for input(s): AST, ALT, ALKPHOS, BILITOT, PROT, ALBUMIN in the last 168 hours. No results for input(s): LIPASE, AMYLASE in the last 168 hours. No results for input(s): AMMONIA in the last 168 hours. Coagulation Profile: No results for input(s): INR, PROTIME in the last 168 hours. Cardiac Enzymes: No results for input(s): CKTOTAL, CKMB, CKMBINDEX, TROPONINI in the last 168 hours. BNP (last 3 results) No results for input(s): PROBNP in the last 8760 hours. HbA1C: No results for input(s): HGBA1C in the last 72 hours. CBG: No results for input(s): GLUCAP in the last 168 hours. Lipid Profile: No results for input(s): CHOL, HDL, LDLCALC, TRIG, CHOLHDL, LDLDIRECT in the last 72 hours. Thyroid Function Tests: No results for input(s): TSH, T4TOTAL, FREET4, T3FREE, THYROIDAB in the last 72 hours. Anemia Panel: No results for input(s): VITAMINB12, FOLATE, FERRITIN, TIBC, IRON, RETICCTPCT in the last 72 hours. Urine analysis:    Component Value Date/Time   COLORURINE YELLOW 02/06/2015 2326   APPEARANCEUR CLOUDY (A) 02/06/2015 2326   LABSPEC 1.017 02/06/2015 2326   PHURINE 7.5 02/06/2015 2326   GLUCOSEU 100 (A) 02/06/2015 2326   GLUCOSEU NEG mg/dL 04/18/2010 2036   HGBUR TRACE (A)  02/06/2015 2326   BILIRUBINUR NEGATIVE 02/06/2015 2326   KETONESUR NEGATIVE 02/06/2015 2326   PROTEINUR >300 (A) 02/06/2015 2326   UROBILINOGEN 2.0 (H) 06/24/2011 0307   NITRITE NEGATIVE 02/06/2015 2326   LEUKOCYTESUR TRACE (A) 02/06/2015 2326     RAI,RIPUDEEP M.D. [facility] 09/30/2015, 1:56 PM  Pager: [phone] Between 7am to 7pm - call Pager - [phone]  After 7pm go to [email or web address] - password [email or web address]  Call night coverage person covering after 7pm

## 2015-09-30 NOTE — Consult Note (Addendum)
[facility] Nurse wound consult note Reason for Consult: Consult requested for buttocks.  Pt had shingles last week and has several partial thickness lesions scattered across bilat buttocks, which are beginning to dry and scab over.  Some have evolved into patchy areas of partial thickness skin loss, and have declined related to the additional combination of shear and pressure across buttocks. Pressure Ulcer POA: These are NOT pressure injuries Measurement: Left buttock lesions: .5X.5X.1cm and 1X1X.1cm, both are pink and dry, no odor or drainage Right buttock with dry scabbed area, darker brown in color and slightly raised; 2.5X2.5cm, no odor or drainage Dressing procedure/placement/frequency: It is best practice to leave shingles lesions open to air without a dressing. Air mattress ordered to increase airflow and promote drying and healing of the affected areas and decrease discomfort. Healing is usually directed towards systemic coverage of this diagnosis. Discussed plan of care with patient and she verbalized understanding. Please re-consult if further assistance is needed.  Thank-you,  Julien Girt MSN, [place], [place], [place], [place]

## 2015-09-30 NOTE — Progress Notes (Addendum)
Pharmacy Antibiotic Note Paula Becker is a 39 y.o. female admitted on 09/29/2015 with concern for PNA in setting of HIV, with long hx of noncompliance with medications, and ESRD. She has a medical history significant for, but not  limited to, HIV / AIDS, depression, necrotizing PNA, MRSA furuncles.  HD qTTS,last dialyzed 8/29   ID consulted-disconitinued levofloxacine and pharmacy consulted for vancomycin and cefepime dosing for HCAP.  Tc 99.1, Tm 99.2, WBC 3.5k ESRD, no HD orders noted yet.  Noted to have h/o of allergy (rash) to vancomycin (allergy dated 03/3010.  Medication review indicates patient received vancomycin IV 01/2015 x1.   Plan: Cefepime 2 gm IV x1 , f/u HD schedule Premedicate with diphenhydramine 25mg  po x1, give 15-30 minutes prior to IV Vancomycin 1gm IV x1 today.  Will f/u on hemodialysis plans/schedule, then I will  order further doses.    Temp (24hrs), Avg:99 F (37.2 C), Min:98.9 F (37.2 C), Max:99.2 F (37.3 C)   Recent Labs Lab 09/29/15 1412 09/30/15 0407  WBC 3.0* 3.5*  CREATININE 5.95* 7.31*    Estimated Creatinine Clearance: 8.3 mL/min (by C-G formula based on SCr of 7.31 mg/dL).    Allergies  Allergen Reactions  . Shellfish-Derived Products Swelling    Facial swelling  . Vancomycin Rash    Antimicrobials this admission: Levaquin 8/30 >> 8/31  Vancomycin 8/31>> Cefepime 8/31>>  Microbiology results: 8/30 BCx: sent    Thank you for allowing pharmacy to be a part of this patient's care. Nicole Cella, RPh Clinical Pharmacist Pager: [phone] 09/30/2015, 10:20 AM

## 2015-09-30 NOTE — Progress Notes (Signed)
Pt refused her 2200 medications. On call NP notified. RN will continue to monitor patient.   Ermalinda Memos, RN

## 2015-10-01 DIAGNOSIS — B029 Zoster without complications: Secondary | ICD-10-CM

## 2015-10-01 DIAGNOSIS — Z9119 Patient's noncompliance with other medical treatment and regimen: Secondary | ICD-10-CM

## 2015-10-01 LAB — CBC
HCT: 28.3 % — ABNORMAL LOW (ref 36.0–46.0)
HEMOGLOBIN: 7.6 g/dL — AB (ref 12.0–15.0)
MCH: 24.8 pg — AB (ref 26.0–34.0)
MCHC: 26.9 g/dL — ABNORMAL LOW (ref 30.0–36.0)
MCV: 92.2 fL (ref 78.0–100.0)
Platelets: 137 10*3/uL — ABNORMAL LOW (ref 150–400)
RBC: 3.07 MIL/uL — AB (ref 3.87–5.11)
RDW: 17.5 % — ABNORMAL HIGH (ref 11.5–15.5)
WBC: 3.5 10*3/uL — ABNORMAL LOW (ref 4.0–10.5)

## 2015-10-01 LAB — RENAL FUNCTION PANEL
ANION GAP: 5 (ref 5–15)
Albumin: 1.7 g/dL — ABNORMAL LOW (ref 3.5–5.0)
BUN: 12 mg/dL (ref 6–20)
CALCIUM: 8.2 mg/dL — AB (ref 8.9–10.3)
CO2: 31 mmol/L (ref 22–32)
Chloride: 102 mmol/L (ref 101–111)
Creatinine, Ser: 4.21 mg/dL — ABNORMAL HIGH (ref 0.44–1.00)
GFR calc non Af Amer: 12 mL/min — ABNORMAL LOW (ref >60)
GFR, EST AFRICAN AMERICAN: 14 mL/min — AB (ref >60)
Glucose, Bld: 86 mg/dL (ref 65–99)
PHOSPHORUS: 3.3 mg/dL (ref 2.5–4.6)
POTASSIUM: 4.8 mmol/L (ref 3.5–5.1)
SODIUM: 138 mmol/L (ref 135–145)

## 2015-10-01 LAB — HIV-1 RNA QUANT-NO REFLEX-BLD
HIV 1 RNA QUANT: 33300 {copies}/mL
LOG10 HIV-1 RNA: 4.522 log10copy/mL

## 2015-10-01 MED ORDER — DEXTROSE 5 % IV SOLN
2.0000 g | INTRAVENOUS | Status: DC
  Administered 2015-10-02: 2 g via INTRAVENOUS
  Filled 2015-10-01 (×2): qty 2

## 2015-10-01 MED ORDER — VANCOMYCIN HCL 500 MG IV SOLR
500.0000 mg | INTRAVENOUS | Status: DC
  Filled 2015-10-01: qty 500

## 2015-10-01 MED ORDER — DIPHENHYDRAMINE HCL 25 MG PO CAPS: 25.0000 mg | ORAL_CAPSULE | ORAL | Status: DC

## 2015-10-01 MED ORDER — DOXERCALCIFEROL 4 MCG/2ML IV SOLN
1.0000 ug | INTRAVENOUS | Status: DC
  Administered 2015-10-02 (×2): 1 ug via INTRAVENOUS
  Filled 2015-10-01: qty 2

## 2015-10-01 NOTE — Progress Notes (Signed)
[facility] for [facility]ious [facility]   Reason for visit: Follow up on possible pneumonia  Interval History: coughing is better, CD 4 noted and is 20, viral load at her baseline 33,300.  No fever, no hypoxia.    CXR independently reviewed from 8/30 and no clear opacity, some atelectasis.   Day 2 vancomycin and cefepime  Physical Exam: Constitutional:  Vitals:   10/01/15 0639 10/01/15 0922  BP: 130/74 (!) 141/65  Pulse: 100 99  Resp: 18 18  Temp: 98.5 F (36.9 C) 99.5 F (37.5 C)   patient appears in NAD; chronically ill appearing Eyes: anicteric HENT: no thrush Respiratory: Normal respiratory effort; CTA B Cardiovascular: RRR GI: soft, nt, nd  Review of Systems: Constitutional: negative for anorexia Musculoskeletal: negative for myalgias and arthralgias  Lab Results  Component Value Date   WBC 3.5 (L) 10/01/2015   HGB 7.6 (L) 10/01/2015   HCT 28.3 (L) 10/01/2015   MCV 92.2 10/01/2015   PLT 137 (L) 10/01/2015    Lab Results  Component Value Date   CREATININE 4.21 (H) 10/01/2015   BUN 12 10/01/2015   NA 138 10/01/2015   K 4.8 10/01/2015   CL 102 10/01/2015   CO2 31 10/01/2015    Lab Results  Component Value Date   ALT 8 (L) 03/12/2015   AST 17 03/12/2015   ALKPHOS 113 03/12/2015     Microbiology: Recent Results (from the past 240 hour(s))  Culture, blood (routine x 2) Call MD if unable to obtain prior to antibiotics being given     Status: None (Preliminary result)   Collection Time: 09/29/15  6:00 PM  Result Value Ref Range Status   Specimen Description BLOOD RIGHT ANTECUBITAL  Final   Special Requests   Final    BOTTLES DRAWN AEROBIC AND ANAEROBIC BLUE 10CC RED 5CC   Culture NO GROWTH 2 DAYS  Final   Report Status PENDING  Incomplete  Culture, blood (routine x 2) Call MD if unable to obtain prior to antibiotics being given     Status: None (Preliminary result)   Collection Time: 09/29/15  6:10 PM  Result Value Ref Range Status   Specimen  Description BLOOD RIGHT HAND  Final   Special Requests BOTTLES DRAWN AEROBIC ONLY 5CC  Final   Culture NO GROWTH 2 DAYS  Final   Report Status PENDING  Incomplete    Impression/Plan:  1. Possible pneumonia - cxr does not appear c/w pneumonia, has had mild hypoxia but on RA now with no SOB.  If pneumonia, is resolving. Can continue with oral augmentin for 3 more days, oral suspension is available or cefazolin x 2 doses with dialysis (already has cefepime on board for now).   I will d/c vancomycin and cefepime  2. Shingles - can't tolerate acyclovir 3. HIV/AIDS - she has a long history of poor compliance and multiple attempts at restarting regimens that are fleeting.  She again tells me she is interested in starting something but unable to tolerate Kaletra solution.  She is extremely limited in her options with her lack of tolerance of pills and resistance developed over years of poor compliance.  The best plan for her will be to have her seen in the clinic next week with our pharmacist to consider yet another regimen though I am not sure anything else is available that she will take.   She has not been on her ARVs for over 1 month which means she took her regimen for a  very short time.  She understands the consequences of poor compliance.   I will arrange follow up for next week.    I will sign off, thanks

## 2015-10-01 NOTE — Progress Notes (Signed)
[facility]                                                                              Patient Demographics  Paula Becker, is a 40 y.o. female, DOB - 08/28/1975, [id number]  Admit date - 09/29/2015   Admitting Physician Waldemar Dickens, MD  Outpatient Primary MD for the patient is Bobby Rumpf, MD  Outpatient specialists:   LOS - 2  days    Chief Complaint  Patient presents with  . Cough  . Rash       Brief summary    Paula Becker is a 40 y.o. female with a medical history significant for, but not  limited to, HIV / AIDS, depression, necrotizing PNA, MRSA furuncles. She had CKD with baseline Cr less than 2 until January of this year when she presented with acute kidney failure requiring dialysis. She is now dialyzing T / Th / Sat. Patient presented to the emergency department today with a three-week history of cough productive of green sputum, pleuritic chest pain. Bibasilar infiltrates on chest x-ray. Patient has been off HIV meds over the last few months. States she hasn't had much food to eat at home to take the medications with.  She was apparently diagnosed with shingles of her buttocks a couple of months ago but did not take the Acyclovir because it upset her stomach.  ED Course:  Afebrile, normotensive, heart rate of 111. Normal oxygen saturation on room air. Pancytopenic.  Last CD4 count in January was 40   Assessment & Plan   HCAP/ healthcare associated pneumonia  HIV / AIDS and hx of necrotizing PNA and PCP.  -ID consulted, recommended to start vancomycin, cefepime -  obtain sputum cultures, pneumocystis stain, serum LDH   HIV/AIDS, multidrug resistant virus, long history of poor adherence/compliance with medications. Currently not taking her ART treatment.            -CD4 ct 20, HIV viral load 33,300 -ID consult obtained, management per ID - continue vancomycin and cefepime for HCAP  History of shingles. Apparently had an outbreak a  couple of months ago on upper buttocks but couldn't tolerate treatment. Current buttock lesions don't look like shingles rather skin tears. - wound care - DC airborne precautions  ESRD on HD Tu / Thu / Sat.  - Nephrology consulted  Code Status: full DVT Prophylaxis:  heparin Family Communication: Discussed in detail with the patient, all imaging results, lab results explained to the patient, son in the room   Disposition Plan:   Time Spent in minutes 15 minutes  Procedures:  none  Consultants:   Infectious disease Nephrology  Antimicrobials:   IV cefepime 8/31>  IV vancomycin 8/31>  Medications  Scheduled Meds: . darbepoetin (ARANESP) injection - DIALYSIS  200 mcg Intravenous Q Thu-HD  . diphenhydrAMINE  25 mg Oral Once  . [START ON 10/02/2015] doxercalciferol  1 mcg Intravenous Q T,Th,Sa-HD  . feeding supplement  1 Container Oral TID BM  . feeding supplement (NEPRO CARB STEADY)  237 mL Oral BID BM  . guaiFENesin  600 mg Oral BID  . heparin  5,000 Units Subcutaneous  Q8H  . multivitamin  1 tablet Oral QHS   Continuous Infusions:   PRN Meds:.sodium chloride, sodium chloride, acetaminophen **OR** acetaminophen, albuterol, bisacodyl, lidocaine (PF), lidocaine-prilocaine, ondansetron **OR** ondansetron (ZOFRAN) IV, pentafluoroprop-tetrafluoroeth, polyethylene glycol, traZODone   Antibiotics   Anti-infectives    Start     Dose/Rate Route Frequency Ordered Stop   10/01/15 1600  levofloxacin (LEVAQUIN) IVPB 500 mg  Status:  Discontinued     500 mg 100 mL/hr over 60 Minutes Intravenous Every 48 hours 09/29/15 1523 09/30/15 1011   09/30/15 1600  vancomycin (VANCOCIN) IVPB 1000 mg/200 mL premix     1,000 mg 200 mL/hr over 60 Minutes Intravenous  Once 09/30/15 1157 09/30/15 1833   09/30/15 1130  ceFEPIme (MAXIPIME) 2 g in dextrose 5 % 50 mL IVPB     2 g 100 mL/hr over 30 Minutes Intravenous  Once 09/30/15 1036 09/30/15 2202   09/29/15 1530  levofloxacin (LEVAQUIN) IVPB  750 mg     750 mg 100 mL/hr over 90 Minutes Intravenous  Once 09/29/15 1522 09/29/15 1735        Subjective:   Nelani Hochstetler was seen and examined today. Feeling better, no fevers or chills. Patient denies dizziness, chest pain, shortness of breath, abdominal pain, N/V/D/C. No acute events overnight.    Objective:   Vitals:   09/30/15 2107 09/30/15 2132 10/01/15 0639 10/01/15 0922  BP: 139/76  130/74 (!) 141/65  Pulse: 100  100 99  Resp: 16  18 18   Temp: 99.8 F (37.7 C) 99.6 F (37.6 C) 98.5 F (36.9 C) 99.5 F (37.5 C)  TempSrc: Oral Oral Oral Oral  SpO2: 99%  98% 97%  Weight:      Height:        Intake/Output Summary (Last 24 hours) at 10/01/15 1212 Last data filed at 10/01/15 0900  Gross per 24 hour  Intake              740 ml  Output             2000 ml  Net            -1260 ml     Wt Readings from Last 3 Encounters:  09/30/15 55.3 kg (121 lb 14.6 oz)  08/18/15 52.2 kg (115 lb)  08/09/15 51.7 kg (114 lb)     Exam  General: Alert and oriented x 3, NAD  HEENT:    Neck: Supple, no JVD  Cardiovascular: S1 S2 clear, RRR  Respiratory: Decreased breath sound at the bases  Gastrointestinal: Soft, NT, NBS  Ext: no cyanosis clubbing or edema b/l  Neuro: No new deficits  Skin: No rashes  Psych: Normal affect and demeanor, alert and oriented x3    Data Reviewed:  I have personally reviewed following labs and imaging studies  Micro Results Recent Results (from the past 240 hour(s))  Culture, blood (routine x 2) Call MD if unable to obtain prior to antibiotics being given     Status: None (Preliminary result)   Collection Time: 09/29/15  6:00 PM  Result Value Ref Range Status   Specimen Description BLOOD RIGHT ANTECUBITAL  Final   Special Requests   Final    BOTTLES DRAWN AEROBIC AND ANAEROBIC BLUE 10CC RED 5CC   Culture NO GROWTH 2 DAYS  Final   Report Status PENDING  Incomplete  Culture, blood (routine x 2) Call MD if unable to obtain prior to  antibiotics being given     Status: None (Preliminary result)  Collection Time: 09/29/15  6:10 PM  Result Value Ref Range Status   Specimen Description BLOOD RIGHT HAND  Final   Special Requests BOTTLES DRAWN AEROBIC ONLY 5CC  Final   Culture NO GROWTH 2 DAYS  Final   Report Status PENDING  Incomplete    Radiology Reports Dg Chest 2 View  Result Date: 09/29/2015 CLINICAL DATA:  Cough productive of green sputum for 1 month, history shingles, AIDS, necrotizing pneumonia EXAM: CHEST  2 VIEW COMPARISON:  03/16/2015 FINDINGS: Normal heart size, mediastinal contours and pulmonary vascularity. Slight rotation of the chest to the RIGHT. Infiltrates at the lung bases bilaterally with mild volume loss and basilar atelectasis. Central peribronchial thickening. Upper lungs grossly clear. No definite pleural effusion or pneumothorax. Bones unremarkable. IMPRESSION: Bronchitic changes with bibasilar infiltrates and atelectasis. Electronically Signed   By: Lavonia Dana M.D.   On: 09/29/2015 15:02    Lab Data:  CBC:  Recent Labs Lab 09/29/15 1412 09/30/15 0407 10/01/15 0701  WBC 3.0* 3.5* 3.5*  NEUTROABS 1.8  --   --   HGB 8.0* 7.4* 7.6*  HCT 28.9* 27.3* 28.3*  MCV 92.0 91.3 92.2  PLT 127* 136* 0000000*   Basic Metabolic Panel:  Recent Labs Lab 09/29/15 1412 09/30/15 0407 10/01/15 0701  NA 139 140 138  K 3.9 3.9 4.8  CL 100* 100* 102  CO2 31 29 31   GLUCOSE 80 95 86  BUN 21* 28* 12  CREATININE 5.95* 7.31* 4.21*  CALCIUM 8.2* 8.0* 8.2*  PHOS  --   --  3.3   GFR: Estimated Creatinine Clearance: 15.4 mL/min (by C-G formula based on SCr of 4.21 mg/dL). Liver Function Tests:  Recent Labs Lab 10/01/15 0701  ALBUMIN 1.7*   No results for input(s): LIPASE, AMYLASE in the last 168 hours. No results for input(s): AMMONIA in the last 168 hours. Coagulation Profile: No results for input(s): INR, PROTIME in the last 168 hours. Cardiac Enzymes: No results for input(s): CKTOTAL, CKMB,  CKMBINDEX, TROPONINI in the last 168 hours. BNP (last 3 results) No results for input(s): PROBNP in the last 8760 hours. HbA1C: No results for input(s): HGBA1C in the last 72 hours. CBG: No results for input(s): GLUCAP in the last 168 hours. Lipid Profile: No results for input(s): CHOL, HDL, LDLCALC, TRIG, CHOLHDL, LDLDIRECT in the last 72 hours. Thyroid Function Tests: No results for input(s): TSH, T4TOTAL, FREET4, T3FREE, THYROIDAB in the last 72 hours. Anemia Panel: No results for input(s): VITAMINB12, FOLATE, FERRITIN, TIBC, IRON, RETICCTPCT in the last 72 hours. Urine analysis:    Component Value Date/Time   COLORURINE YELLOW 02/06/2015 2326   APPEARANCEUR CLOUDY (A) 02/06/2015 2326   LABSPEC 1.017 02/06/2015 2326   PHURINE 7.5 02/06/2015 2326   GLUCOSEU 100 (A) 02/06/2015 2326   GLUCOSEU NEG mg/dL 04/18/2010 2036   HGBUR TRACE (A) 02/06/2015 2326   BILIRUBINUR NEGATIVE 02/06/2015 2326   KETONESUR NEGATIVE 02/06/2015 2326   PROTEINUR >300 (A) 02/06/2015 2326   UROBILINOGEN 2.0 (H) 06/24/2011 0307   NITRITE NEGATIVE 02/06/2015 2326   LEUKOCYTESUR TRACE (A) 02/06/2015 2326     RAI,RIPUDEEP M.D. [facility] 10/01/2015, 12:12 PM  Pager: [phone] Between 7am to 7pm - call Pager - [phone]  After 7pm go to [email or web address] - password [email or web address]  Call night coverage person covering after 7pm

## 2015-10-01 NOTE — Progress Notes (Signed)
[facility] Progress Note  Assessment/Plan: 1. HCAP/ CXR with bibasilar infiltrates - per primary. ID following - on IV Vanc/cefepime(? vanc allergy give with Benadryl) - pneumocystis DFA, sputum cx pending 2. ESRD -  TTS on schedule K+ 4.8, for HD tomorrow  3. Hypertension/volume  - BP controlled - OP BP readings usually 120s no edema today - HD yesterday net UF 2L post weight 55.3 kg? HD tomorrow UF goal 3-4L  4. Anemia  - Hgb 7.4  - recent ESA ^ after not receiving for 3 weeks -  - Aranesp 200 mcg IV  given 8/31 5. Metabolic bone disease -  Ca 8.2 - on VDRA decrease to 1 mcg for ^ corr Ca/ trouble swallowing pills had Renvela packets - Phos 3.3 hold binders for now 6. Nutrition - Alb 1.7   - liberalize diet - regular diet/vitamins/protein supplement 7. HIV/AIDS - ID following poor OP compliance with HAART 8. Rash - per primary h/o HSV anogenital infection   Lynnda Child, PA-C [facility] 10/01/2015,10:45 AM  LOS: 2 days    Renal Attending: I agree with note above by PA Ogechi.  We plan for HD in AM to keep on schedule. Brennon Otterness C   Subjective:  Feels better than yesterday. Still weak. Says cough improved some. Still c/o painful rash to buttocks. Tolerated HD yesterday with no c/os  Objective Vitals:   09/30/15 2107 09/30/15 2132 10/01/15 0639 10/01/15 0922  BP: 139/76  130/74 (!) 141/65  Pulse: 100  100 99  Resp: 16  18 18   Temp: 99.8 F (37.7 C) 99.6 F (37.6 C) 98.5 F (36.9 C) 99.5 F (37.5 C)  TempSrc: Oral Oral Oral Oral  SpO2: 99%  98% 97%  Weight:      Height:       Physical Exam General: Cachectic chronically ill appearing female NAD  Heart: RRR S1 S2 Lungs: Breathing unlabored CTAB  Abdomen: soft NT ND Extremities: No LE edema  Skin: scattered ulcerative lesions to buttocks - no dermatomal distribution Dialysis Access: LUE AVF +thrill   Dialysis Orders: [facility] 4 h 180 F 350/A 1.5x  2K/2Ca EDW 50.5kg  LUE AVF 1700  U Heparin Hectorol 3 mcg IV q treatment Mircera 225 mcg IV q 2 weeks (last dose 8/19) OP Labs: Hgb 7.9  Tsat 26% K 5.2 Ca 8.4 P 6.0 iPTH 329  Additional Objective Labs: Basic Metabolic Panel:  Recent Labs Lab 09/29/15 1412 09/30/15 0407 10/01/15 0701  NA 139 140 138  K 3.9 3.9 4.8  CL 100* 100* 102  CO2 31 29 31   GLUCOSE 80 95 86  BUN 21* 28* 12  CREATININE 5.95* 7.31* 4.21*  CALCIUM 8.2* 8.0* 8.2*  PHOS  --   --  3.3   Liver Function Tests:  Recent Labs Lab 10/01/15 0701  ALBUMIN 1.7*   No results for input(s): LIPASE, AMYLASE in the last 168 hours. CBC:  Recent Labs Lab 09/29/15 1412 09/30/15 0407 10/01/15 0701  WBC 3.0* 3.5* 3.5*  NEUTROABS 1.8  --   --   HGB 8.0* 7.4* 7.6*  HCT 28.9* 27.3* 28.3*  MCV 92.0 91.3 92.2  PLT 127* 136* 137*   Blood Culture    Component Value Date/Time   SDES BLOOD RIGHT HAND 09/29/2015 1810   SPECREQUEST BOTTLES DRAWN AEROBIC ONLY 5CC 09/29/2015 1810   CULT NO GROWTH < 24 HOURS 09/29/2015 1810   REPTSTATUS PENDING 09/29/2015 1810    Cardiac Enzymes: No results for input(s):  CKTOTAL, CKMB, CKMBINDEX, TROPONINI in the last 168 hours. CBG: No results for input(s): GLUCAP in the last 168 hours. Iron Studies: No results for input(s): IRON, TIBC, TRANSFERRIN, FERRITIN in the last 72 hours. Lab Results  Component Value Date   INR 1.37 03/11/2015   Studies/Results: Dg Chest 2 View  Result Date: 09/29/2015 CLINICAL DATA:  Cough productive of green sputum for 1 month, history shingles, AIDS, necrotizing pneumonia EXAM: CHEST  2 VIEW COMPARISON:  03/16/2015 FINDINGS: Normal heart size, mediastinal contours and pulmonary vascularity. Slight rotation of the chest to the RIGHT. Infiltrates at the lung bases bilaterally with mild volume loss and basilar atelectasis. Central peribronchial thickening. Upper lungs grossly clear. No definite pleural effusion or pneumothorax. Bones unremarkable. IMPRESSION: Bronchitic changes with  bibasilar infiltrates and atelectasis. Electronically Signed   By: Lavonia Dana M.D.   On: 09/29/2015 15:02   Medications:   . darbepoetin (ARANESP) injection - DIALYSIS  200 mcg Intravenous Q Thu-HD  . diphenhydrAMINE  25 mg Oral Once  . [START ON 10/02/2015] doxercalciferol  3 mcg Intravenous Q T,Th,Sa-HD  . feeding supplement  1 Container Oral TID BM  . feeding supplement (NEPRO CARB STEADY)  237 mL Oral BID BM  . guaiFENesin  600 mg Oral BID  . heparin  5,000 Units Subcutaneous Q8H  . multivitamin  1 tablet Oral QHS

## 2015-10-02 LAB — RENAL FUNCTION PANEL
Albumin: 1.7 g/dL — ABNORMAL LOW (ref 3.5–5.0)
Anion gap: 8 (ref 5–15)
BUN: 21 mg/dL — ABNORMAL HIGH (ref 6–20)
CALCIUM: 8.4 mg/dL — AB (ref 8.9–10.3)
CO2: 28 mmol/L (ref 22–32)
CREATININE: 6.32 mg/dL — AB (ref 0.44–1.00)
Chloride: 102 mmol/L (ref 101–111)
GFR, EST AFRICAN AMERICAN: 9 mL/min — AB (ref >60)
GFR, EST NON AFRICAN AMERICAN: 7 mL/min — AB (ref >60)
Glucose, Bld: 78 mg/dL (ref 65–99)
Phosphorus: 4.1 mg/dL (ref 2.5–4.6)
Potassium: 4.5 mmol/L (ref 3.5–5.1)
SODIUM: 138 mmol/L (ref 135–145)

## 2015-10-02 LAB — HEPATITIS B SURFACE ANTIGEN: HEP B S AG: NEGATIVE

## 2015-10-02 LAB — CBC
HCT: 26.9 % — ABNORMAL LOW (ref 36.0–46.0)
Hemoglobin: 7.4 g/dL — ABNORMAL LOW (ref 12.0–15.0)
MCH: 25.1 pg — AB (ref 26.0–34.0)
MCHC: 27.5 g/dL — ABNORMAL LOW (ref 30.0–36.0)
MCV: 91.2 fL (ref 78.0–100.0)
PLATELETS: 152 10*3/uL (ref 150–400)
RBC: 2.95 MIL/uL — AB (ref 3.87–5.11)
RDW: 17.3 % — AB (ref 11.5–15.5)
WBC: 3 10*3/uL — ABNORMAL LOW (ref 4.0–10.5)

## 2015-10-02 MED ORDER — ACETAMINOPHEN 325 MG PO TABS
ORAL_TABLET | ORAL | Status: AC
  Administered 2015-10-02: 650 mg via ORAL
  Filled 2015-10-02: qty 2

## 2015-10-02 MED ORDER — VANCOMYCIN HCL IN DEXTROSE 500-5 MG/100ML-% IV SOLN
INTRAVENOUS | Status: AC
  Administered 2015-10-02: 500 mg
  Filled 2015-10-02: qty 100

## 2015-10-02 MED ORDER — SODIUM CHLORIDE 0.9 % IV SOLN: 100.0000 mL | INTRAVENOUS | Status: DC | PRN

## 2015-10-02 MED ORDER — HEPARIN SODIUM (PORCINE) 1000 UNIT/ML DIALYSIS: 20.0000 [IU]/kg | INTRAMUSCULAR | Status: DC | PRN

## 2015-10-02 MED ORDER — DOXERCALCIFEROL 4 MCG/2ML IV SOLN
INTRAVENOUS | Status: AC
  Administered 2015-10-02: 1 ug via INTRAVENOUS
  Filled 2015-10-02: qty 2

## 2015-10-02 NOTE — Procedures (Signed)
EDW was 50.5.  Now lowered due to pulmonary congestion.  Intradialytic hypotension in hospital so probably have achieved new EDW with improvement in lung symptoms.  Pre weight today 51.3kg Net goal 3000cc.  Will see what post weight is. Paula Becker C

## 2015-10-02 NOTE — Progress Notes (Signed)
[facility]                                                                              Patient Demographics  Paula Becker, is a 40 y.o. female, DOB - 1975-10-01, [id number]  Admit date - 09/29/2015   Admitting Physician Waldemar Dickens, MD  Outpatient Primary MD for the patient is Bobby Rumpf, MD  Outpatient specialists:   LOS - 3  days    Chief Complaint  Patient presents with  . Cough  . Rash       Brief summary    Paula Becker is a 40 y.o. female with a medical history significant for, but not  limited to, HIV / AIDS, depression, necrotizing PNA, MRSA furuncles. She had CKD with baseline Cr less than 2 until January of this year when she presented with acute kidney failure requiring dialysis. She is now dialyzing T / Th / Sat. Patient presented to the emergency department today with a three-week history of cough productive of green sputum, pleuritic chest pain. Bibasilar infiltrates on chest x-ray. Patient has been off HIV meds over the last few months. States she hasn't had much food to eat at home to take the medications with.  She was apparently diagnosed with shingles of her buttocks a couple of months ago but did not take the Acyclovir because it upset her stomach.  ED Course:  Afebrile, normotensive, heart rate of 111. Normal oxygen saturation on room air. Pancytopenic.  Last CD4 count in January was 40   Assessment & Plan   HCAP/ healthcare associated pneumonia  HIV / AIDS and hx of necrotizing PNA and PCP.  -ID consulted, Patient was placed on vancomycin, cefepime -  obtain sputum cultures, pneumocystis stain, serum LDH - Per ID, continue oral Augmentin for 3 more days or cefazolin with the dialysis, DC vancomycin and cefepime   HIV/AIDS, multidrug resistant virus, long history of poor adherence/compliance with medications. Currently not taking her ART treatment.            -CD4 ct 20, HIV viral load 33,300 -ID consult obtained,  management per ID - Patient has been very noncompliant with her HIV, limited in her options with lack of tolerance and resistance, ID recommended follow-up in the clinic next week outpatient for further recommendations.  History of shingles. Apparently had an outbreak a couple of months ago on upper buttocks but couldn't tolerate treatment. Current buttock lesions don't look like shingles rather skin tears. - wound care - DC airborne precautions  ESRD on HD Tu / Thu / Sat.  - Nephrology consulted, patient undergoing hemodialysis today  Code Status: full DVT Prophylaxis:  heparin Family Communication: Discussed in detail with the patient, all imaging results, lab results explained to the patient  Disposition Plan: Likely DC home in a.m., undergoing HD today  Time Spent in minutes 15 minutes  Procedures:  none  Consultants:   Infectious disease Nephrology  Antimicrobials:   IV cefepime 8/31> 9/2  IV vancomycin 8/31> 9/2  Medications  Scheduled Meds: . ceFEPime (MAXIPIME) IV  2 g Intravenous Q T,Th,Sa-HD  . darbepoetin (ARANESP) injection - DIALYSIS  200 mcg Intravenous Q Thu-HD  . diphenhydrAMINE  25 mg Oral Q T,Th,Sa-HD   And  . vancomycin  500 mg Intravenous Q T,Th,Sa-HD  . doxercalciferol  1 mcg Intravenous Q T,Th,Sa-HD  . feeding supplement  1 Container Oral TID BM  . feeding supplement (NEPRO CARB STEADY)  237 mL Oral BID BM  . guaiFENesin  600 mg Oral BID  . heparin  5,000 Units Subcutaneous Q8H  . multivitamin  1 tablet Oral QHS   Continuous Infusions:   PRN Meds:.acetaminophen **OR** acetaminophen, albuterol, bisacodyl, ondansetron **OR** ondansetron (ZOFRAN) IV, polyethylene glycol, traZODone   Antibiotics   Anti-infectives    Start     Dose/Rate Route Frequency Ordered Stop   10/02/15 1200  vancomycin (VANCOCIN) 500 mg in sodium chloride 0.9 % 100 mL IVPB     500 mg 100 mL/hr over 60 Minutes Intravenous Every T-Th-Sa (Hemodialysis) 10/01/15 1755       10/02/15 1200  ceFEPIme (MAXIPIME) 2 g in dextrose 5 % 50 mL IVPB     2 g 100 mL/hr over 30 Minutes Intravenous Every T-Th-Sa (Hemodialysis) 10/01/15 1755     10/02/15 0856  vancomycin (VANCOCIN) 500-5 MG/100ML-% IVPB    Comments:  Paula Becker, Paula Becker   : cabinet override      10/02/15 0856 10/02/15 0952   10/01/15 1600  levofloxacin (LEVAQUIN) IVPB 500 mg  Status:  Discontinued     500 mg 100 mL/hr over 60 Minutes Intravenous Every 48 hours 09/29/15 1523 09/30/15 1011   09/30/15 1600  vancomycin (VANCOCIN) IVPB 1000 mg/200 mL premix     1,000 mg 200 mL/hr over 60 Minutes Intravenous  Once 09/30/15 1157 09/30/15 1833   09/30/15 1130  ceFEPIme (MAXIPIME) 2 g in dextrose 5 % 50 mL IVPB     2 g 100 mL/hr over 30 Minutes Intravenous  Once 09/30/15 1036 09/30/15 2202   09/29/15 1530  levofloxacin (LEVAQUIN) IVPB 750 mg     750 mg 100 mL/hr over 90 Minutes Intravenous  Once 09/29/15 1522 09/29/15 1735        Subjective:   Paula Becker was seen and examined today in HD, no complaints. No fevers or chills. Patient denies dizziness, chest pain, shortness of breath, abdominal pain, N/V/D/C. No acute events overnight.    Objective:   Vitals:   10/02/15 1000 10/02/15 1030 10/02/15 1059 10/02/15 1150  BP: 107/60 (!) 85/53 102/61 (!) 106/54  Pulse: (!) 104 93 96 93  Resp:   20 20  Temp:   98.8 F (37.1 C) 98 F (36.7 C)  TempSrc:   Oral Oral  SpO2:   98% 98%  Weight:   49.7 kg (109 lb 9.1 oz)   Height:        Intake/Output Summary (Last 24 hours) at 10/02/15 1231 Last data filed at 10/02/15 1059  Gross per 24 hour  Intake              360 ml  Output             1813 ml  Net            -1453 ml     Wt Readings from Last 3 Encounters:  10/02/15 49.7 kg (109 lb 9.1 oz)  08/18/15 52.2 kg (115 lb)  08/09/15 51.7 kg (114 lb)     Exam  General: Alert and oriented x 3, NAD  HEENT:    Neck: Supple, no JVD  Cardiovascular: S1 S2 clear, RRR  Respiratory: Decreased  breath  sound at the bases  Gastrointestinal: Soft, NT, NBS  Ext: no cyanosis clubbing or edema b/l  Neuro: No new deficits  Skin: No rashes  Psych: Normal affect and demeanor, alert and oriented x3    Data Reviewed:  I have personally reviewed following labs and imaging studies  Micro Results Recent Results (from the past 240 hour(s))  Culture, blood (routine x 2) Call MD if unable to obtain prior to antibiotics being given     Status: None (Preliminary result)   Collection Time: 09/29/15  6:00 PM  Result Value Ref Range Status   Specimen Description BLOOD RIGHT ANTECUBITAL  Final   Special Requests   Final    BOTTLES DRAWN AEROBIC AND ANAEROBIC BLUE 10CC RED 5CC   Culture NO GROWTH 2 DAYS  Final   Report Status PENDING  Incomplete  Culture, blood (routine x 2) Call MD if unable to obtain prior to antibiotics being given     Status: None (Preliminary result)   Collection Time: 09/29/15  6:10 PM  Result Value Ref Range Status   Specimen Description BLOOD RIGHT HAND  Final   Special Requests BOTTLES DRAWN AEROBIC ONLY 5CC  Final   Culture NO GROWTH 2 DAYS  Final   Report Status PENDING  Incomplete    Radiology Reports Dg Chest 2 View  Result Date: 09/29/2015 CLINICAL DATA:  Cough productive of green sputum for 1 month, history shingles, AIDS, necrotizing pneumonia EXAM: CHEST  2 VIEW COMPARISON:  03/16/2015 FINDINGS: Normal heart size, mediastinal contours and pulmonary vascularity. Slight rotation of the chest to the RIGHT. Infiltrates at the lung bases bilaterally with mild volume loss and basilar atelectasis. Central peribronchial thickening. Upper lungs grossly clear. No definite pleural effusion or pneumothorax. Bones unremarkable. IMPRESSION: Bronchitic changes with bibasilar infiltrates and atelectasis. Electronically Signed   By: Lavonia Dana M.D.   On: 09/29/2015 15:02    Lab Data:  CBC:  Recent Labs Lab 09/29/15 1412 09/30/15 0407 10/01/15 0701 10/02/15 0709  WBC  3.0* 3.5* 3.5* 3.0*  NEUTROABS 1.8  --   --   --   HGB 8.0* 7.4* 7.6* 7.4*  HCT 28.9* 27.3* 28.3* 26.9*  MCV 92.0 91.3 92.2 91.2  PLT 127* 136* 137* 0000000   Basic Metabolic Panel:  Recent Labs Lab 09/29/15 1412 09/30/15 0407 10/01/15 0701 10/02/15 0709  NA 139 140 138 138  K 3.9 3.9 4.8 4.5  CL 100* 100* 102 102  CO2 31 29 31 28   GLUCOSE 80 95 86 78  BUN 21* 28* 12 21*  CREATININE 5.95* 7.31* 4.21* 6.32*  CALCIUM 8.2* 8.0* 8.2* 8.4*  PHOS  --   --  3.3 4.1   GFR: Estimated Creatinine Clearance: 9.2 mL/min (by C-G formula based on SCr of 6.32 mg/dL). Liver Function Tests:  Recent Labs Lab 10/01/15 0701 10/02/15 0709  ALBUMIN 1.7* 1.7*   No results for input(s): LIPASE, AMYLASE in the last 168 hours. No results for input(s): AMMONIA in the last 168 hours. Coagulation Profile: No results for input(s): INR, PROTIME in the last 168 hours. Cardiac Enzymes: No results for input(s): CKTOTAL, CKMB, CKMBINDEX, TROPONINI in the last 168 hours. BNP (last 3 results) No results for input(s): PROBNP in the last 8760 hours. HbA1C: No results for input(s): HGBA1C in the last 72 hours. CBG: No results for input(s): GLUCAP in the last 168 hours. Lipid Profile: No results for input(s): CHOL, HDL, LDLCALC, TRIG, CHOLHDL, LDLDIRECT in the last 72 hours. Thyroid Function  Tests: No results for input(s): TSH, T4TOTAL, FREET4, T3FREE, THYROIDAB in the last 72 hours. Anemia Panel: No results for input(s): VITAMINB12, FOLATE, FERRITIN, TIBC, IRON, RETICCTPCT in the last 72 hours. Urine analysis:    Component Value Date/Time   COLORURINE YELLOW 02/06/2015 2326   APPEARANCEUR CLOUDY (A) 02/06/2015 2326   LABSPEC 1.017 02/06/2015 2326   PHURINE 7.5 02/06/2015 2326   GLUCOSEU 100 (A) 02/06/2015 2326   GLUCOSEU NEG mg/dL 04/18/2010 2036   HGBUR TRACE (A) 02/06/2015 2326   BILIRUBINUR NEGATIVE 02/06/2015 2326   KETONESUR NEGATIVE 02/06/2015 2326   PROTEINUR >300 (A) 02/06/2015 2326    UROBILINOGEN 2.0 (H) 06/24/2011 0307   NITRITE NEGATIVE 02/06/2015 2326   LEUKOCYTESUR TRACE (A) 02/06/2015 2326     Erion Weightman M.D. [facility] 10/02/2015, 12:31 PM  Pager: [phone] Between 7am to 7pm - call Pager - [phone]  After 7pm go to [email or web address] - password [email or web address]  Call night coverage person covering after 7pm

## 2015-10-03 MED ORDER — GUAIFENESIN ER 600 MG PO TB12: 600.0000 mg | ORAL_TABLET | Freq: Two times a day (BID) | ORAL | 3 refills | Status: DC

## 2015-10-03 MED ORDER — BENZONATATE 100 MG PO CAPS: 100.0000 mg | ORAL_CAPSULE | Freq: Three times a day (TID) | ORAL | 0 refills | Status: DC | PRN

## 2015-10-03 MED ORDER — CEFAZOLIN SODIUM-DEXTROSE 2-4 GM/100ML-% IV SOLN: 2.0000 g | INTRAVENOUS | Status: DC

## 2015-10-03 MED ORDER — ALBUTEROL SULFATE HFA 108 (90 BASE) MCG/ACT IN AERS: 2.0000 | INHALATION_SPRAY | Freq: Four times a day (QID) | RESPIRATORY_TRACT | 2 refills | Status: DC | PRN

## 2015-10-03 MED ORDER — HYDROXYZINE HCL 25 MG PO TABS: 25.0000 mg | ORAL_TABLET | Freq: Four times a day (QID) | ORAL | 0 refills | Status: DC | PRN

## 2015-10-03 NOTE — Discharge Summary (Signed)
Physician Discharge Summary   Patient ID: Paula Becker MRN: [id number] DOB/AGE: 02-24-1975 40 y.o.  Admit date: 09/29/2015 Discharge date: 10/03/2015  Primary Care Physician:  Johny Sax, MD  Discharge Diagnoses:    . HIV disease (HCC) . HCAP (healthcare-associated pneumonia) . Secondary hyperparathyroidism of renal origin (HCC) . HSV (herpes simplex virus) anogenital infection   Severe noncompliance   Consults:  Infectious disease, Dr. Luciana Axe Nephrology  Recommendations for Outpatient Follow-up:  1. Continue Ancef 2 g for 2 more doses after discharge with dialysis 2. Please repeat CBC/BMET at next visit 3. Patient recommended strongly to follow up with infectious disease she has a follow-up appointment with Dr. Ninetta Lights next month however recommended to call on Tuesday for an earlier appointment for HAART regimen   DIET: Heart healthy diet    Allergies:   Allergies  Allergen Reactions  . Shellfish-Derived Products Swelling    Facial swelling  . Vancomycin Rash     DISCHARGE MEDICATIONS: Current Discharge Medication List    START taking these medications   Details  albuterol (PROVENTIL HFA;VENTOLIN HFA) 108 (90 Base) MCG/ACT inhaler Inhale 2 puffs into the lungs every 6 (six) hours as needed for wheezing or shortness of breath. Qty: 1 Inhaler, Refills: 2    benzonatate (TESSALON PERLES) 100 MG capsule Take 1 capsule (100 mg total) by mouth 3 (three) times daily as needed for cough. For cough Qty: 30 capsule, Refills: 0    ceFAZolin (ANCEF) 2-4 GM/100ML-% IVPB Inject 100 mLs (2 g total) into the vein Every Tuesday,Thursday,and Saturday with dialysis. X 2 doses Qty: 1 each    guaiFENesin (MUCINEX) 600 MG 12 hr tablet Take 1 tablet (600 mg total) by mouth 2 (two) times daily. Qty: 30 tablet, Refills: 3      CONTINUE these medications which have CHANGED   Details  hydrOXYzine (ATARAX/VISTARIL) 25 MG tablet Take 1 tablet (25 mg total) by mouth every 6 (six)  hours as needed for itching. Qty: 30 tablet, Refills: 0      CONTINUE these medications which have NOT CHANGED   Details  Darbepoetin Alfa (ARANESP) 100 MCG/0.5ML SOSY injection Inject 0.5 mLs (100 mcg total) into the vein every Thursday with hemodialysis. Qty: 4.2 mL    dolutegravir (TIVICAY) 50 MG tablet Take 1 tablet (50 mg total) by mouth 2 (two) times daily. Qty: 60 tablet, Refills: 0   Associated Diagnoses: Human immunodeficiency virus (HIV) disease (HCC)    doxercalciferol (HECTOROL) 4 MCG/2ML injection Inject 1 mL (2 mcg total) into the vein Every Tuesday,Thursday,and Saturday with dialysis. Qty: 2 mL    feeding supplement (BOOST / RESOURCE BREEZE) LIQD Take 1 Container by mouth 3 (three) times daily between meals. Qty: 237 mL, Refills: 0    ibuprofen (ADVIL,MOTRIN) 200 MG tablet Take 200 mg by mouth every 6 (six) hours as needed for moderate pain.    lopinavir-ritonavir (KALETRA) 400-100 MG/5ML solution Take 10 mLs (800 mg total) by mouth daily with breakfast. Qty: 3320 mL, Refills: 3   Associated Diagnoses: Human immunodeficiency virus (HIV) disease (HCC)    VIREAD 300 MG tablet TAKE 1 TABLET BY MOUTH ONCE A WEEK Qty: 30 tablet, Refills: 4   Associated Diagnoses: Human immunodeficiency virus (HIV) disease (HCC)    zidovudine (RETROVIR) 100 MG capsule TAKE 3 CAPSULES(300 MG) BY MOUTH DAILY WITH BREAKFAST Qty: 90 capsule, Refills: 0   Associated Diagnoses: Human immunodeficiency virus (HIV) disease (HCC)    alum & mag hydroxide-simeth (MAALOX/MYLANTA) 200-200-20 MG/5ML suspension Take 30 mLs by  mouth every 6 (six) hours as needed for indigestion, heartburn or flatulence. Qty: 355 mL, Refills: 0    gabapentin (NEURONTIN) 300 MG capsule TAKE 1 CAPSULE BY MOUTH THREE TIMES DAILY Qty: 60 capsule, Refills: 2    multivitamin (RENA-VIT) TABS tablet Take 1 tablet by mouth at bedtime. Refills: 0   Associated Diagnoses: Human immunodeficiency virus (HIV) disease (HCC)     sulfamethoxazole-trimethoprim (BACTRIM) 400-80 MG/5ML injection Inject 10 mLs (160 mg of trimethoprim total) into the vein every Monday, Wednesday, and Friday. Qty: 300 mL, Refills: 3   Associated Diagnoses: Human immunodeficiency virus (HIV) disease (HCC)    valACYclovir (VALTREX) 500 MG tablet Take 1 tablet (500 mg total) by mouth daily. Qty: 21 tablet, Refills: 0   Associated Diagnoses: CKD (chronic kidney disease) stage V requiring chronic dialysis (HCC)         Brief H and P: For complete details please refer to admission H and P, but in brief Paula Becker a 40 y.o.femalewith a medical history significant for, but not limited to,HIV / AIDS, depression, necrotizing PNA, MRSA furuncles. She had CKD with baseline Cr less than 2 until January of this year when she presented with acute kidney failure requiring dialysis. She is now dialyzing T / Th / Sat. Patient presented to the emergency department today with a three-week history of cough productive of green sputum, pleuritic chest pain. Bibasilar infiltrates on chest x-ray. Patient has been off HIV meds over the last few months. States she hasn't had much food to eat at home to take the medications with. She was apparently diagnosed with shingles of her buttocks a couple of months ago but did not take the Acyclovirbecause it upset her stomach.  ED Course: Afebrile, normotensive, heart rate of 111. Normal oxygen saturation on room air. Pancytopenic. Last CD4 count in January was [facility] Course:   HCAP/ healthcare associated pneumonia  HIV / AIDS and hx of necrotizing PNA and PCP.  -ID consulted, Patient was placed on vancomycin, cefepime. Blood cultures remain negative to date - Per ID, continue oral Augmentin for 3 more days or cefazolin with the dialysis, DC vancomycin and cefepime. Given patient's known history of severe noncompliance, discussed with nephrology for 2 more doses of cefazolin with dialysis outpatient. Renal  service will contact outpatient dialysis center.  HIV/AIDS, multidrug resistant virus, long history of poor adherence/compliance with medications. Currently not taking her ART treatment.  -CD4 ct 20, HIV viral load 33,300. - Patient has been very noncompliant with her HIV, limited in her options with lack of tolerance and resistance, ID recommended follow-up in the clinic next week outpatient for further recommendations. She has a follow-up appointment however in next month. Recommended patient to call ID office on Tuesday for early appointment.  History of shingles. Apparently had anoutbreak a couple of months ago on upper buttocks but couldn't tolerate treatment. Current buttock lesions don't look like shingles ratherskin tears.  ESRD on HDTu / Thu / Sat.  - Nephrology consulted, patient underwent hemodialysis per her schedule.   Day of Discharge BP 127/69 (BP Location: Right Leg)   Pulse (!) 109   Temp 99.4 F (37.4 C) (Oral)   Resp 18   Ht 5\' 8"  (1.727 m)   Wt 49.9 kg (110 lb)   LMP 01/16/2015   SpO2 100%   BMI 16.73 kg/m   Physical Exam: General: Alert and awake oriented x3 not in any acute distress. HEENT: anicteric sclera, pupils reactive to light and accommodation CVS:  S1-S2 clear no murmur rubs or gallops Chest: clear to auscultation bilaterally, no wheezing rales or rhonchi Abdomen: soft nontender, nondistended, normal bowel sounds Extremities: no cyanosis, clubbing or edema noted bilaterally Neuro: Cranial nerves II-XII intact, no focal neurological deficits   The results of significant diagnostics from this hospitalization (including imaging, microbiology, ancillary and laboratory) are listed below for reference.    LAB RESULTS: Basic Metabolic Panel:  Recent Labs Lab 10/01/15 0701 10/02/15 0709  NA 138 138  K 4.8 4.5  CL 102 102  CO2 31 28  GLUCOSE 86 78  BUN 12 21*  CREATININE 4.21* 6.32*  CALCIUM 8.2* 8.4*  PHOS 3.3 4.1   Liver  Function Tests:  Recent Labs Lab 10/01/15 0701 10/02/15 0709  ALBUMIN 1.7* 1.7*   No results for input(s): LIPASE, AMYLASE in the last 168 hours. No results for input(s): AMMONIA in the last 168 hours. CBC:  Recent Labs Lab 09/29/15 1412  10/01/15 0701 10/02/15 0709  WBC 3.0*  < > 3.5* 3.0*  NEUTROABS 1.8  --   --   --   HGB 8.0*  < > 7.6* 7.4*  HCT 28.9*  < > 28.3* 26.9*  MCV 92.0  < > 92.2 91.2  PLT 127*  < > 137* 152  < > = values in this interval not displayed. Cardiac Enzymes: No results for input(s): CKTOTAL, CKMB, CKMBINDEX, TROPONINI in the last 168 hours. BNP: Invalid input(s): POCBNP CBG: No results for input(s): GLUCAP in the last 168 hours.  Significant Diagnostic Studies:  Dg Chest 2 View  Result Date: 09/29/2015 CLINICAL DATA:  Cough productive of green sputum for 1 month, history shingles, AIDS, necrotizing pneumonia EXAM: CHEST  2 VIEW COMPARISON:  03/16/2015 FINDINGS: Normal heart size, mediastinal contours and pulmonary vascularity. Slight rotation of the chest to the RIGHT. Infiltrates at the lung bases bilaterally with mild volume loss and basilar atelectasis. Central peribronchial thickening. Upper lungs grossly clear. No definite pleural effusion or pneumothorax. Bones unremarkable. IMPRESSION: Bronchitic changes with bibasilar infiltrates and atelectasis. Electronically Signed   By: Lavonia Dana M.D.   On: 09/29/2015 15:02    2D ECHO:   Disposition and Follow-up: Discharge Instructions    Diet - low sodium heart healthy    Complete by:  As directed   Increase activity slowly    Complete by:  As directed       DISPOSITION: [employer]    Bobby Rumpf, MD Follow up on 11/08/2015.   Specialty:  Infectious Diseases Why:  at 11:15AM for hospital follow-up. Please call on Tuesday for an earlier appointment. Contact information: [place] [address] [phone]             Time spent on Discharge: 25 minute  Signed:   Aneli Zara M.D. [facility] 10/03/2015, 11:03 AM Pager: [phone]

## 2015-10-03 NOTE — Progress Notes (Signed)
[facility] Progress Note   Dialysis Orders: [facility] TTS4 h 180 F 350/A 1.5x 2K/2Ca EDW 50.5kg  LUE AVF 1700 U Heparin Hectorol 3 mcg IV q treatment Mircera 225 mcg IV q 2 weeks (last dose 8/19) OP Labs: Hgb 7.9 Tsat 26% K 5.2 Ca 8.4 P 6.0 iPTH 329  Assessment/Plan: 1. HCAP/ CXR with bibasilar infiltrates - changing to Ancef for 2 more doses after discharge; primary felt this was the better options given compliance issues 2. ESRD- TTS -next HD Tuesday - moving to [place] - advised her to ask HD unit about transfer to another unit closer to her new apartment. 3. Hypertension/volume- BP controlled - net UF 1.8 on Sat with post weight 49.7 slightly below edw- lower EDW for discharge 4. Anemia- Hgb 7.4 - recent ESA ^ after not receiving for 3 weeks -  - Aranesp 200 mcg IV  given 8/31- follow trends after d/c - evaluate for needing course of Fe- will check outpt records 5. Metabolic bone disease- Ca 8.4 - corrected Ca 10.2   - on VDRA decrease to 1 mcg for ^ corr Ca/ trouble swallowing pills had Renvela packets - Phos 3.3  Binders held  for now P up to 4.1- needs f/u after d/c- will refer to outpt unit 6. Nutrition- Alb 1.7   - liberalize diet - regular diet/vitamins/protein supplement 7. HIV/AIDS- ID following poor OP compliance with HAART 8. Rash - per primary h/o HSV anogenital infection   Myriam Jacobson, PA-C [place] [phone] 10/03/2015,10:05 AM  LOS: 4 days   Renal Attending: I agree with note as articulated above.  She is weak but plans are for discharge. Kitana Gage C   Subjective:   Her dad is moving her things today to a new apartment.  Objective Vitals:   10/02/15 1858 10/02/15 2036 10/03/15 0521 10/03/15 0946  BP: 131/73 123/69 123/83 127/69  Pulse: 93 90 94 (!) 109  Resp: 18 19 20 18   Temp: 98.5 F (36.9 C) 98.5 F (36.9 C) 98.8 F (37.1 C) 99.4 F (37.4 C)  TempSrc: Oral Oral Oral Oral  SpO2: 99%  100% 100% 100%  Weight:  49.9 kg (110 lb)    Height:       Physical Exam General: NAD, very thin Heart: tachy ~100 reg Lungs: no rales Abdomen: soft NT Extremities: no edema Dialysis Access: left upper AVF + bruit   Additional Objective Labs: Basic Metabolic Panel:  Recent Labs Lab 09/30/15 0407 10/01/15 0701 10/02/15 0709  NA 140 138 138  K 3.9 4.8 4.5  CL 100* 102 102  CO2 29 31 28   GLUCOSE 95 86 78  BUN 28* 12 21*  CREATININE 7.31* 4.21* 6.32*  CALCIUM 8.0* 8.2* 8.4*  PHOS  --  3.3 4.1   Liver Function Tests:  Recent Labs Lab 10/01/15 0701 10/02/15 0709  ALBUMIN 1.7* 1.7*   CBC:  Recent Labs Lab 09/29/15 1412 09/30/15 0407 10/01/15 0701 10/02/15 0709  WBC 3.0* 3.5* 3.5* 3.0*  NEUTROABS 1.8  --   --   --   HGB 8.0* 7.4* 7.6* 7.4*  HCT 28.9* 27.3* 28.3* 26.9*  MCV 92.0 91.3 92.2 91.2  PLT 127* 136* 137* 152   Blood Culture    Component Value Date/Time   SDES BLOOD RIGHT HAND 09/29/2015 1810   SPECREQUEST BOTTLES DRAWN AEROBIC ONLY 5CC 09/29/2015 1810   CULT NO GROWTH 3 DAYS 09/29/2015 1810   REPTSTATUS PENDING 09/29/2015 1810  Medications:   Marland Kitchen [  START ON 10/05/2015]  ceFAZolin (ANCEF) IV  2 g Intravenous Q T,Th,Sa-HD  . darbepoetin (ARANESP) injection - DIALYSIS  200 mcg Intravenous Q Thu-HD  . doxercalciferol  1 mcg Intravenous Q T,Th,Sa-HD  . feeding supplement  1 Container Oral TID BM  . feeding supplement (NEPRO CARB STEADY)  237 mL Oral BID BM  . guaiFENesin  600 mg Oral BID  . heparin  5,000 Units Subcutaneous Q8H  . multivitamin  1 tablet Oral QHS

## 2015-10-04 LAB — CULTURE, BLOOD (ROUTINE X 2)
CULTURE: NO GROWTH
CULTURE: NO GROWTH

## 2015-10-07 ENCOUNTER — Other Ambulatory Visit: Payer: Self-pay | Admitting: Infectious Diseases

## 2015-10-07 DIAGNOSIS — B20 Human immunodeficiency virus [HIV] disease: Secondary | ICD-10-CM

## 2015-10-07 DIAGNOSIS — D631 Anemia in chronic kidney disease: Secondary | ICD-10-CM | POA: Diagnosis not present

## 2015-10-07 DIAGNOSIS — N186 End stage renal disease: Secondary | ICD-10-CM | POA: Diagnosis not present

## 2015-10-07 DIAGNOSIS — N2581 Secondary hyperparathyroidism of renal origin: Secondary | ICD-10-CM | POA: Diagnosis not present

## 2015-10-09 DIAGNOSIS — N2581 Secondary hyperparathyroidism of renal origin: Secondary | ICD-10-CM | POA: Diagnosis not present

## 2015-10-09 DIAGNOSIS — D631 Anemia in chronic kidney disease: Secondary | ICD-10-CM | POA: Diagnosis not present

## 2015-10-09 DIAGNOSIS — N186 End stage renal disease: Secondary | ICD-10-CM | POA: Diagnosis not present

## 2015-10-12 DIAGNOSIS — D631 Anemia in chronic kidney disease: Secondary | ICD-10-CM | POA: Diagnosis not present

## 2015-10-12 DIAGNOSIS — N2581 Secondary hyperparathyroidism of renal origin: Secondary | ICD-10-CM | POA: Diagnosis not present

## 2015-10-12 DIAGNOSIS — N186 End stage renal disease: Secondary | ICD-10-CM | POA: Diagnosis not present

## 2015-10-14 DIAGNOSIS — N2581 Secondary hyperparathyroidism of renal origin: Secondary | ICD-10-CM | POA: Diagnosis not present

## 2015-10-14 DIAGNOSIS — D631 Anemia in chronic kidney disease: Secondary | ICD-10-CM | POA: Diagnosis not present

## 2015-10-14 DIAGNOSIS — N186 End stage renal disease: Secondary | ICD-10-CM | POA: Diagnosis not present

## 2015-10-15 ENCOUNTER — Ambulatory Visit: Payer: Self-pay | Admitting: *Deleted

## 2015-10-15 DIAGNOSIS — B20 Human immunodeficiency virus [HIV] disease: Secondary | ICD-10-CM

## 2015-10-15 DIAGNOSIS — Z9114 Patient's other noncompliance with medication regimen: Secondary | ICD-10-CM

## 2015-10-16 DIAGNOSIS — N186 End stage renal disease: Secondary | ICD-10-CM | POA: Diagnosis not present

## 2015-10-16 DIAGNOSIS — N2581 Secondary hyperparathyroidism of renal origin: Secondary | ICD-10-CM | POA: Diagnosis not present

## 2015-10-16 DIAGNOSIS — D631 Anemia in chronic kidney disease: Secondary | ICD-10-CM | POA: Diagnosis not present

## 2015-10-19 DIAGNOSIS — D631 Anemia in chronic kidney disease: Secondary | ICD-10-CM | POA: Diagnosis not present

## 2015-10-19 DIAGNOSIS — N2581 Secondary hyperparathyroidism of renal origin: Secondary | ICD-10-CM | POA: Diagnosis not present

## 2015-10-19 DIAGNOSIS — N186 End stage renal disease: Secondary | ICD-10-CM | POA: Diagnosis not present

## 2015-10-21 NOTE — Telephone Encounter (Signed)
Visit opened in ERROR

## 2015-10-23 DIAGNOSIS — N186 End stage renal disease: Secondary | ICD-10-CM | POA: Diagnosis not present

## 2015-10-23 DIAGNOSIS — N2581 Secondary hyperparathyroidism of renal origin: Secondary | ICD-10-CM | POA: Diagnosis not present

## 2015-10-23 DIAGNOSIS — D631 Anemia in chronic kidney disease: Secondary | ICD-10-CM | POA: Diagnosis not present

## 2015-10-26 DIAGNOSIS — N186 End stage renal disease: Secondary | ICD-10-CM | POA: Diagnosis not present

## 2015-10-26 DIAGNOSIS — N2581 Secondary hyperparathyroidism of renal origin: Secondary | ICD-10-CM | POA: Diagnosis not present

## 2015-10-26 DIAGNOSIS — D631 Anemia in chronic kidney disease: Secondary | ICD-10-CM | POA: Diagnosis not present

## 2015-10-27 NOTE — Progress Notes (Addendum)
Verbal order received from Dr hatcher approving this one visit for evaluation of the patient current needs to see if a recertification or Discharge is needed.  RN received a call from Paula Becker stating she now has her own apartment and really needs to see me today. RN arranged a home visit for today. Patient stated she is glad to have a apartment and it was a emergency for me to come. Paula Becker stated the emergency was that she does not have any washing powder and really needs 5 dollars. Paula Becker stated the sores on her bottom are very painful and makes it hard for her to sit at times. RN offered to go to the clinic to see if they have any donated washing power or samples. RN offered a understanding ear and informed Paula Becker that the best way to management her outbreaks is to take her medication. Paula Becker verbalized understanding stated she knows she needs to take her medication and then stated she really needs 5 dollars and she will walk to [employer](which is over a mile walk one way)and back with the washing powder. RN informed Paula Becker that I do not have any funding on me to do that. After a lengthy conversation Paula Becker continued to state she wanted to wash her clothes and take them to her dad's to dry them. It was evident that Paula Becker is not interested in discussing medication adherence at this time. RN informed Paula Becker that I would love to help her with medication adherence and barriers that keep her from taking her medication but at this time it does not appear that she is ready to have those types of conversations. Paula Becker stated she understood. RN informed Paula Becker that I will be here for her when she is ready.  Based on the above information I do not feel a recertification is needed at this time. Over the last year and a half RN has attempted to persuade the patient to stay in care with me and adhere to her medications without success. Once Paula Becker is ready I would love to engage with her and assist with medication adherence.   The intent of this communication is to inform the Health Care Team that this patient will be discharged from [facility]).  Greater than 3 attempts have been made to re-engage the patient along with home visits without any success. Based on this information the services will be terminated since the patient refuses to honor client responsibilities as set forth in Client Rights and Responsibilities statement.  Moving forward, the [facility] will be willing to reopen the patient to services if and when the patient is ready to discuss medication adherence and HIV disease management. Effective 07/07/2014 patient will be discharged and removed from [facility] active patient listing.

## 2015-10-30 DIAGNOSIS — D631 Anemia in chronic kidney disease: Secondary | ICD-10-CM | POA: Diagnosis not present

## 2015-10-30 DIAGNOSIS — N2581 Secondary hyperparathyroidism of renal origin: Secondary | ICD-10-CM | POA: Diagnosis not present

## 2015-10-30 DIAGNOSIS — I120 Hypertensive chronic kidney disease with stage 5 chronic kidney disease or end stage renal disease: Secondary | ICD-10-CM | POA: Diagnosis not present

## 2015-10-30 DIAGNOSIS — Z992 Dependence on renal dialysis: Secondary | ICD-10-CM | POA: Diagnosis not present

## 2015-10-30 DIAGNOSIS — N186 End stage renal disease: Secondary | ICD-10-CM | POA: Diagnosis not present

## 2015-11-02 DIAGNOSIS — Z23 Encounter for immunization: Secondary | ICD-10-CM | POA: Diagnosis not present

## 2015-11-02 DIAGNOSIS — N2581 Secondary hyperparathyroidism of renal origin: Secondary | ICD-10-CM | POA: Diagnosis not present

## 2015-11-02 DIAGNOSIS — N186 End stage renal disease: Secondary | ICD-10-CM | POA: Diagnosis not present

## 2015-11-04 ENCOUNTER — Ambulatory Visit: Payer: Medicare Other

## 2015-11-04 DIAGNOSIS — N2581 Secondary hyperparathyroidism of renal origin: Secondary | ICD-10-CM | POA: Diagnosis not present

## 2015-11-04 DIAGNOSIS — N186 End stage renal disease: Secondary | ICD-10-CM | POA: Diagnosis not present

## 2015-11-04 DIAGNOSIS — Z23 Encounter for immunization: Secondary | ICD-10-CM | POA: Diagnosis not present

## 2015-11-06 DIAGNOSIS — N186 End stage renal disease: Secondary | ICD-10-CM | POA: Diagnosis not present

## 2015-11-06 DIAGNOSIS — Z23 Encounter for immunization: Secondary | ICD-10-CM | POA: Diagnosis not present

## 2015-11-06 DIAGNOSIS — N2581 Secondary hyperparathyroidism of renal origin: Secondary | ICD-10-CM | POA: Diagnosis not present

## 2015-11-08 ENCOUNTER — Ambulatory Visit: Payer: Self-pay | Admitting: Infectious Diseases

## 2015-11-08 DIAGNOSIS — N186 End stage renal disease: Secondary | ICD-10-CM | POA: Diagnosis not present

## 2015-11-08 DIAGNOSIS — D509 Iron deficiency anemia, unspecified: Secondary | ICD-10-CM | POA: Diagnosis not present

## 2015-11-08 DIAGNOSIS — D631 Anemia in chronic kidney disease: Secondary | ICD-10-CM | POA: Diagnosis not present

## 2015-11-08 DIAGNOSIS — N2581 Secondary hyperparathyroidism of renal origin: Secondary | ICD-10-CM | POA: Diagnosis not present

## 2015-11-09 ENCOUNTER — Ambulatory Visit: Payer: Self-pay | Admitting: Infectious Diseases

## 2015-11-10 DIAGNOSIS — D509 Iron deficiency anemia, unspecified: Secondary | ICD-10-CM | POA: Diagnosis not present

## 2015-11-10 DIAGNOSIS — N186 End stage renal disease: Secondary | ICD-10-CM | POA: Diagnosis not present

## 2015-11-10 DIAGNOSIS — N2581 Secondary hyperparathyroidism of renal origin: Secondary | ICD-10-CM | POA: Diagnosis not present

## 2015-11-10 DIAGNOSIS — D631 Anemia in chronic kidney disease: Secondary | ICD-10-CM | POA: Diagnosis not present

## 2015-11-12 DIAGNOSIS — N2581 Secondary hyperparathyroidism of renal origin: Secondary | ICD-10-CM | POA: Diagnosis not present

## 2015-11-12 DIAGNOSIS — N186 End stage renal disease: Secondary | ICD-10-CM | POA: Diagnosis not present

## 2015-11-12 DIAGNOSIS — D631 Anemia in chronic kidney disease: Secondary | ICD-10-CM | POA: Diagnosis not present

## 2015-11-12 DIAGNOSIS — D509 Iron deficiency anemia, unspecified: Secondary | ICD-10-CM | POA: Diagnosis not present

## 2015-11-15 DIAGNOSIS — D509 Iron deficiency anemia, unspecified: Secondary | ICD-10-CM | POA: Diagnosis not present

## 2015-11-15 DIAGNOSIS — N2581 Secondary hyperparathyroidism of renal origin: Secondary | ICD-10-CM | POA: Diagnosis not present

## 2015-11-15 DIAGNOSIS — N186 End stage renal disease: Secondary | ICD-10-CM | POA: Diagnosis not present

## 2015-11-15 DIAGNOSIS — D631 Anemia in chronic kidney disease: Secondary | ICD-10-CM | POA: Diagnosis not present

## 2015-11-17 DIAGNOSIS — D631 Anemia in chronic kidney disease: Secondary | ICD-10-CM | POA: Diagnosis not present

## 2015-11-17 DIAGNOSIS — N186 End stage renal disease: Secondary | ICD-10-CM | POA: Diagnosis not present

## 2015-11-17 DIAGNOSIS — N2581 Secondary hyperparathyroidism of renal origin: Secondary | ICD-10-CM | POA: Diagnosis not present

## 2015-11-17 DIAGNOSIS — D509 Iron deficiency anemia, unspecified: Secondary | ICD-10-CM | POA: Diagnosis not present

## 2015-11-18 ENCOUNTER — Telehealth: Payer: Self-pay | Admitting: *Deleted

## 2015-11-18 NOTE — Telephone Encounter (Signed)
Patient called c/o all over body itching. Stated she thought it might be coming from her dialysis. Advised patient to let them know when she goes there tomorrow. She was also advised to try benadryl over the counter and to not drive, as this may cause drowsiness. She agreed to this plan.

## 2015-11-19 DIAGNOSIS — D631 Anemia in chronic kidney disease: Secondary | ICD-10-CM | POA: Diagnosis not present

## 2015-11-19 DIAGNOSIS — N186 End stage renal disease: Secondary | ICD-10-CM | POA: Diagnosis not present

## 2015-11-19 DIAGNOSIS — D509 Iron deficiency anemia, unspecified: Secondary | ICD-10-CM | POA: Diagnosis not present

## 2015-11-19 DIAGNOSIS — N2581 Secondary hyperparathyroidism of renal origin: Secondary | ICD-10-CM | POA: Diagnosis not present

## 2015-11-22 DIAGNOSIS — D509 Iron deficiency anemia, unspecified: Secondary | ICD-10-CM | POA: Diagnosis not present

## 2015-11-22 DIAGNOSIS — N186 End stage renal disease: Secondary | ICD-10-CM | POA: Diagnosis not present

## 2015-11-22 DIAGNOSIS — D631 Anemia in chronic kidney disease: Secondary | ICD-10-CM | POA: Diagnosis not present

## 2015-11-22 DIAGNOSIS — N2581 Secondary hyperparathyroidism of renal origin: Secondary | ICD-10-CM | POA: Diagnosis not present

## 2015-11-24 ENCOUNTER — Telehealth: Payer: Self-pay | Admitting: *Deleted

## 2015-11-24 ENCOUNTER — Other Ambulatory Visit: Payer: Self-pay | Admitting: *Deleted

## 2015-11-24 DIAGNOSIS — N2581 Secondary hyperparathyroidism of renal origin: Secondary | ICD-10-CM | POA: Diagnosis not present

## 2015-11-24 DIAGNOSIS — D631 Anemia in chronic kidney disease: Secondary | ICD-10-CM | POA: Diagnosis not present

## 2015-11-24 DIAGNOSIS — D509 Iron deficiency anemia, unspecified: Secondary | ICD-10-CM | POA: Diagnosis not present

## 2015-11-24 DIAGNOSIS — N186 End stage renal disease: Secondary | ICD-10-CM | POA: Diagnosis not present

## 2015-11-24 NOTE — Telephone Encounter (Signed)
Purpose of this communication is to relay that that the set frequency for home visits this week has been changed due to a missed visit. Communication has been made with the patient to ensure needs have been met and to offer a home visit. At this time a return call has not been received before the business week is out. Next week the [facility] Based Health Care Nurse plans to continue contact with the patient to offer any services or attempt to address any needs the patient expresses that is reasonable.  

## 2015-11-24 NOTE — Telephone Encounter (Signed)
RN received a call from Paula Becker stating she now has her own apartment and really needs to see me today. RN arranged a home visit for today. Patient stated she is glad to have a apartment and it was a emergency for me to come. Paula Becker stated the emergency was that she does not have any washing powder and really needs 5 dollars. Paula Becker stated the sores on her bottom are very painful and makes it hard for her to sit at times. RN offered to go to the clinic to see if they have any donated washing power or samples. RN offered a understanding ear and informed Paula Becker that the best way to management her outbreaks is to take her medication. Paula Becker verbalized understanding stated she knows she needs to take her medication and then stated she really needs 5 dollars and she will walk to [employer](which is over a mile walk one way)and back with the washing powder. RN informed Paula Becker that I do not have any funding on me to do that. After a lengthy conversation Paula Becker continued to state she wanted to wash her clothes and take them to her dad's to dry them. It was evident that Paula Becker is not interested in discussing medication adherence at this time. RN informed Paula Becker that I would love to help her with medication adherence and barriers that keep her from taking her medication but at this time it does not appear that she is ready to have those types of conversations. Paula Becker stated she understood. RN informed Paula Becker that I will be here for her when she is ready.  Based on the above information I do not feel a recertification is needed at this time. Over the last year and a half RN has attempted to persuade the patient to stay in care with me and adhere to her medications without success. Once Paula Becker is ready I would love to engage with her and assist with medication adherence.  The intent of this communication is to inform the Health Care Team that this patient will be discharged from [facility]  Greater than 3 attempts have been made to re-engage the patient along with home visits without any success. Based on this information the services will be terminated since the patient refuses to honor client responsibilities as set forth in Client Rights and Responsibilities statement.  Moving forward, the [facility] will be willing to reopen the patient to services if and when the patient is ready to discuss medication adherence and HIV disease management. Effective 07/07/2014 patient will be discharged and removed from [facility] active patient listing.

## 2015-11-26 DIAGNOSIS — N2581 Secondary hyperparathyroidism of renal origin: Secondary | ICD-10-CM | POA: Diagnosis not present

## 2015-11-26 DIAGNOSIS — N186 End stage renal disease: Secondary | ICD-10-CM | POA: Diagnosis not present

## 2015-11-26 DIAGNOSIS — D631 Anemia in chronic kidney disease: Secondary | ICD-10-CM | POA: Diagnosis not present

## 2015-11-26 DIAGNOSIS — D509 Iron deficiency anemia, unspecified: Secondary | ICD-10-CM | POA: Diagnosis not present

## 2015-11-29 DIAGNOSIS — N186 End stage renal disease: Secondary | ICD-10-CM | POA: Diagnosis not present

## 2015-11-29 DIAGNOSIS — D631 Anemia in chronic kidney disease: Secondary | ICD-10-CM | POA: Diagnosis not present

## 2015-11-29 DIAGNOSIS — D509 Iron deficiency anemia, unspecified: Secondary | ICD-10-CM | POA: Diagnosis not present

## 2015-11-29 DIAGNOSIS — N2581 Secondary hyperparathyroidism of renal origin: Secondary | ICD-10-CM | POA: Diagnosis not present

## 2015-11-30 DIAGNOSIS — N186 End stage renal disease: Secondary | ICD-10-CM | POA: Diagnosis not present

## 2015-11-30 DIAGNOSIS — Z992 Dependence on renal dialysis: Secondary | ICD-10-CM | POA: Diagnosis not present

## 2015-11-30 DIAGNOSIS — I120 Hypertensive chronic kidney disease with stage 5 chronic kidney disease or end stage renal disease: Secondary | ICD-10-CM | POA: Diagnosis not present

## 2015-12-03 DIAGNOSIS — D631 Anemia in chronic kidney disease: Secondary | ICD-10-CM | POA: Diagnosis not present

## 2015-12-03 DIAGNOSIS — N2581 Secondary hyperparathyroidism of renal origin: Secondary | ICD-10-CM | POA: Diagnosis not present

## 2015-12-03 DIAGNOSIS — N186 End stage renal disease: Secondary | ICD-10-CM | POA: Diagnosis not present

## 2015-12-04 ENCOUNTER — Other Ambulatory Visit: Payer: Self-pay | Admitting: Infectious Diseases

## 2015-12-04 DIAGNOSIS — B20 Human immunodeficiency virus [HIV] disease: Secondary | ICD-10-CM

## 2015-12-06 DIAGNOSIS — D631 Anemia in chronic kidney disease: Secondary | ICD-10-CM | POA: Diagnosis not present

## 2015-12-06 DIAGNOSIS — N2581 Secondary hyperparathyroidism of renal origin: Secondary | ICD-10-CM | POA: Diagnosis not present

## 2015-12-06 DIAGNOSIS — N186 End stage renal disease: Secondary | ICD-10-CM | POA: Diagnosis not present

## 2015-12-07 ENCOUNTER — Telehealth: Payer: Self-pay

## 2015-12-07 NOTE — Telephone Encounter (Signed)
Patient called with complaints of vaginal irritation that seems very similar to a previous herpes outbreak but worse. She is requesting office visit. Patient was advised if she is having a herpes outbreak we could refill her valtrex.  She refused the valtrex refill stating she has medication at home.  I advised she start medication and call back if symptoms worsen or do not improve within 5 days.    She was very vague about symptoms and did not seem to be interested in taking the Valtrex.   Laverle Patter, RN  I contacted [facility] Nurse) and shared information with hopes she could check in with Suleyma this week to see if she started the Valtrex or if office visit was needed.

## 2015-12-07 NOTE — Telephone Encounter (Signed)
Received message from Tammy stating the above concerns. RN plans to f/u with [facility] tomorrow to she has began her valtrex and if she plans to continue treatment for her ourbreaks.

## 2015-12-08 ENCOUNTER — Telehealth: Payer: Self-pay | Admitting: *Deleted

## 2015-12-08 DIAGNOSIS — N2581 Secondary hyperparathyroidism of renal origin: Secondary | ICD-10-CM | POA: Diagnosis not present

## 2015-12-08 DIAGNOSIS — N186 End stage renal disease: Secondary | ICD-10-CM | POA: Diagnosis not present

## 2015-12-08 DIAGNOSIS — D631 Anemia in chronic kidney disease: Secondary | ICD-10-CM | POA: Diagnosis not present

## 2015-12-08 NOTE — Telephone Encounter (Signed)
Yesterday, per Orland Mustard: "Patient called with complaints of vaginal irritation that seems very similar to a previous herpes outbreak but worse. She is requesting office visit. Patient was advised if she is having a herpes outbreak we could refill her valtrex.  She refused the valtrex refill stating she has medication at home.  I advised she start medication and call back if symptoms worsen or do not improve within 5 days.    She was very vague about symptoms and did not seem to be interested in taking the Valtrex.   Laverle Patter, RN  I contacted [facility] Nurse) and shared information with hopes she could check in with Johnanna this week to see if she started the Valtrex or if office visit was needed."  Today, RN contacted [facility] and received a message stating "the number or code you dialed is incorrect". RN attempted several calls with the same results. Plan at this time is to try again at a later date

## 2015-12-10 DIAGNOSIS — D631 Anemia in chronic kidney disease: Secondary | ICD-10-CM | POA: Diagnosis not present

## 2015-12-10 DIAGNOSIS — N186 End stage renal disease: Secondary | ICD-10-CM | POA: Diagnosis not present

## 2015-12-10 DIAGNOSIS — N2581 Secondary hyperparathyroidism of renal origin: Secondary | ICD-10-CM | POA: Diagnosis not present

## 2015-12-11 ENCOUNTER — Other Ambulatory Visit: Payer: Self-pay | Admitting: Infectious Diseases

## 2015-12-11 DIAGNOSIS — B20 Human immunodeficiency virus [HIV] disease: Secondary | ICD-10-CM

## 2015-12-15 DIAGNOSIS — D631 Anemia in chronic kidney disease: Secondary | ICD-10-CM | POA: Diagnosis not present

## 2015-12-15 DIAGNOSIS — N2581 Secondary hyperparathyroidism of renal origin: Secondary | ICD-10-CM | POA: Diagnosis not present

## 2015-12-15 DIAGNOSIS — N186 End stage renal disease: Secondary | ICD-10-CM | POA: Diagnosis not present

## 2015-12-17 DIAGNOSIS — N186 End stage renal disease: Secondary | ICD-10-CM | POA: Diagnosis not present

## 2015-12-17 DIAGNOSIS — D631 Anemia in chronic kidney disease: Secondary | ICD-10-CM | POA: Diagnosis not present

## 2015-12-17 DIAGNOSIS — N2581 Secondary hyperparathyroidism of renal origin: Secondary | ICD-10-CM | POA: Diagnosis not present

## 2015-12-21 DIAGNOSIS — N2581 Secondary hyperparathyroidism of renal origin: Secondary | ICD-10-CM | POA: Diagnosis not present

## 2015-12-21 DIAGNOSIS — N186 End stage renal disease: Secondary | ICD-10-CM | POA: Diagnosis not present

## 2015-12-21 DIAGNOSIS — D631 Anemia in chronic kidney disease: Secondary | ICD-10-CM | POA: Diagnosis not present

## 2015-12-24 DIAGNOSIS — N186 End stage renal disease: Secondary | ICD-10-CM | POA: Diagnosis not present

## 2015-12-24 DIAGNOSIS — N2581 Secondary hyperparathyroidism of renal origin: Secondary | ICD-10-CM | POA: Diagnosis not present

## 2015-12-24 DIAGNOSIS — D631 Anemia in chronic kidney disease: Secondary | ICD-10-CM | POA: Diagnosis not present

## 2015-12-27 ENCOUNTER — Emergency Department (HOSPITAL_COMMUNITY)
Admission: EM | Admit: 2015-12-27 | Discharge: 2015-12-27 | Disposition: A | Discharge status: Home / Self Care | Payer: Medicare Other | Attending: Emergency Medicine | Admitting: Emergency Medicine

## 2015-12-27 ENCOUNTER — Other Ambulatory Visit: Payer: Self-pay

## 2015-12-27 ENCOUNTER — Encounter (HOSPITAL_COMMUNITY): Payer: Self-pay

## 2015-12-27 DIAGNOSIS — Z79899 Other long term (current) drug therapy: Secondary | ICD-10-CM | POA: Diagnosis not present

## 2015-12-27 DIAGNOSIS — N186 End stage renal disease: Secondary | ICD-10-CM | POA: Insufficient documentation

## 2015-12-27 DIAGNOSIS — Z5189 Encounter for other specified aftercare: Secondary | ICD-10-CM

## 2015-12-27 DIAGNOSIS — Z992 Dependence on renal dialysis: Secondary | ICD-10-CM | POA: Diagnosis not present

## 2015-12-27 DIAGNOSIS — E875 Hyperkalemia: Secondary | ICD-10-CM | POA: Diagnosis not present

## 2015-12-27 DIAGNOSIS — Z4801 Encounter for change or removal of surgical wound dressing: Secondary | ICD-10-CM | POA: Diagnosis not present

## 2015-12-27 DIAGNOSIS — Z4800 Encounter for change or removal of nonsurgical wound dressing: Secondary | ICD-10-CM | POA: Diagnosis not present

## 2015-12-27 LAB — COMPREHENSIVE METABOLIC PANEL
ALT: 34 U/L (ref 14–54)
AST: 45 U/L — ABNORMAL HIGH (ref 15–41)
Albumin: 2.2 g/dL — ABNORMAL LOW (ref 3.5–5.0)
Alkaline Phosphatase: 169 U/L — ABNORMAL HIGH (ref 38–126)
Anion gap: 16 — ABNORMAL HIGH (ref 5–15)
BUN: 75 mg/dL — ABNORMAL HIGH (ref 6–20)
CO2: 23 mmol/L (ref 22–32)
Calcium: 8.5 mg/dL — ABNORMAL LOW (ref 8.9–10.3)
Chloride: 110 mmol/L (ref 101–111)
Creatinine, Ser: 10.03 mg/dL — ABNORMAL HIGH (ref 0.44–1.00)
GFR calc Af Amer: 5 mL/min — ABNORMAL LOW (ref >60)
GFR calc non Af Amer: 4 mL/min — ABNORMAL LOW (ref >60)
Glucose, Bld: 139 mg/dL — ABNORMAL HIGH (ref 65–99)
Potassium: 5.6 mmol/L — ABNORMAL HIGH (ref 3.5–5.1)
Sodium: 149 mmol/L — ABNORMAL HIGH (ref 135–145)
Total Bilirubin: 0.2 mg/dL — ABNORMAL LOW (ref 0.3–1.2)
Total Protein: 7.7 g/dL (ref 6.5–8.1)

## 2015-12-27 LAB — CBC WITH DIFFERENTIAL/PLATELET
Basophils Absolute: 0 10*3/uL (ref 0.0–0.1)
Basophils Relative: 0 %
Eosinophils Absolute: 0.3 10*3/uL (ref 0.0–0.7)
Eosinophils Relative: 5 %
HCT: 32.8 % — ABNORMAL LOW (ref 36.0–46.0)
Hemoglobin: 9.8 g/dL — ABNORMAL LOW (ref 12.0–15.0)
Lymphocytes Relative: 10 %
Lymphs Abs: 0.6 10*3/uL — ABNORMAL LOW (ref 0.7–4.0)
MCH: 26.1 pg (ref 26.0–34.0)
MCHC: 29.9 g/dL — ABNORMAL LOW (ref 30.0–36.0)
MCV: 87.5 fL (ref 78.0–100.0)
Monocytes Absolute: 0.4 10*3/uL (ref 0.1–1.0)
Monocytes Relative: 6 %
Neutro Abs: 4.9 10*3/uL (ref 1.7–7.7)
Neutrophils Relative %: 79 %
Platelets: 127 10*3/uL — ABNORMAL LOW (ref 150–400)
RBC: 3.75 MIL/uL — ABNORMAL LOW (ref 3.87–5.11)
RDW: 15.8 % — ABNORMAL HIGH (ref 11.5–15.5)
WBC: 6.2 10*3/uL (ref 4.0–10.5)

## 2015-12-27 LAB — I-STAT CG4 LACTIC ACID, ED: Lactic Acid, Venous: 1.95 mmol/L (ref 0.5–1.9)

## 2015-12-27 MED ORDER — SODIUM POLYSTYRENE SULFONATE 15 GM/60ML PO SUSP
30.0000 g | Freq: Once | ORAL | Status: AC
  Administered 2015-12-27: 30 g via ORAL
  Filled 2015-12-27: qty 120

## 2015-12-27 MED ORDER — HYDROCODONE-ACETAMINOPHEN 5-325 MG PO TABS: 1.0000 | ORAL_TABLET | ORAL | 0 refills | Status: DC | PRN

## 2015-12-27 MED ORDER — OXYCODONE-ACETAMINOPHEN 5-325 MG PO TABS
1.0000 | ORAL_TABLET | Freq: Once | ORAL | Status: AC
  Administered 2015-12-27: 1 via ORAL
  Filled 2015-12-27: qty 1

## 2015-12-27 NOTE — ED Triage Notes (Addendum)
Pt reports pain on her bottom in between her buttocks cheeks. She has a bandage above her sacral region. Pt is a MWF dialysis pt. Last dialyzed on Friday. Pt has two wounds to her bottom.

## 2015-12-27 NOTE — Discharge Instructions (Signed)
The wounds on your lower back/buttocks may be herpes. When Infectious disease previously saw you, then did not recommend specific therapy beyond symptom control. Try to keep pressure off these areas the best you can. You are being prescribed pain medicine to take as needed. Use nonadherent bandages so your dressings and clothes do no stick to the wounds. Follow-up with infectious disease.

## 2015-12-27 NOTE — ED Provider Notes (Signed)
[facility] DEPT Provider Note   CSN: [id number] Arrival date & time: 12/27/15  1202   By signing my name below, I, Delton Prairie, attest that this documentation has been prepared under the direction and in the presence of Virgel Manifold, MD  Electronically Signed: Delton Prairie, ED Scribe. 12/27/15. 2:24 PM.   History   Chief Complaint Chief Complaint  Patient presents with  . Wound Check    The history is provided by the patient. No language interpreter was used.   HPI Comments:  Paula Becker is a 40 y.o. female who presents to the Emergency Department complaining of multiple wounds with associated pain to her bilateral upper buttocks x several months. She notes associated abdominal pain and intermittent discharge from the wounds. She notes she is laying down and sitting, daily, for an extended amount of time. Pt is on dialysis and goes on Mondays/Wednesdays/Fridays. She was not able to got to dialysis today and notes her last full session was on 12/24/15. No alleviating factors noted. Pt denies fevers, chills, any other associated symptoms and modifying factors at this time. Her PCP is Dr. Bobby Rumpf who she sees for HIV.     Past Medical History:  Diagnosis Date  . Anemia of chronic disease   . ESRD on dialysis ([place])   . Herpes   . History of noncompliance with medical treatment   . HIV (human immunodeficiency virus infection) ([place])   . MRSA (methicillin resistant staph aureus) culture positive   . Necrotizing pneumonia ([place]) 07/2010  . Pneumonia 06/23/11   RLL patchy, nodular lung disease  . Renal disorder     Patient Active Problem List   Diagnosis Date Noted  . HIV disease ([place])   . HCAP (healthcare-associated pneumonia)   . ESRD on dialysis ([place])   . 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 Pressure ulcer 03/10/2015  . Secondary hyperparathyroidism of renal origin ([place]) 03/10/2015  . Diarrhea 02/06/2015  . 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  Noncompliance with medication treatment due to difficulty with route of medication administration 03/15/2011  . Folliculitis [id number]  . HSV (herpes simplex virus) anogenital infection 08/19/2010  . Anemia of chronic disease 05/31/2010  . Severe protein-calorie malnutrition ([place]) 05/31/2010  . Unspecified episodic mood disorder 05/31/2010    Past Surgical History:  Procedure Laterality Date  . BASCILIC VEIN TRANSPOSITION Left 03/16/2015   Procedure: BASCILIC VEIN TRANSPOSITION LEFT UPPER ARM;  Surgeon: Angelia Mould, MD;  Location: [facility];  Service: Vascular;  Laterality: Left;  . BREAST SURGERY  03/2010   right; "don't know what they did"  . CESAREAN SECTION  1998  . INSERTION OF DIALYSIS CATHETER N/A 03/16/2015   Procedure: INSERTION OF DIALYSIS CATHETER RIGHT INTERNAL JUGULAR;  Surgeon: Angelia Mould, MD;  Location: [facility];  Service: Vascular;  Laterality: N/A;  . VIDEO BRONCHOSCOPY  06/28/2011   Procedure: VIDEO BRONCHOSCOPY WITH FLUORO;  Surgeon: Chesley Mires, MD;  Location: [facility];  Service: Cardiopulmonary;  Laterality: Bilateral;    OB History    No data available       Home Medications    Prior to Admission medications   Medication Sig Start Date End Date Taking? Authorizing Provider  albuterol (PROVENTIL HFA;VENTOLIN HFA) 108 (90 Base) MCG/ACT inhaler Inhale 2 puffs into the lungs every 6 (six) hours as needed for wheezing or shortness of breath. 10/03/15   Ripudeep Krystal Eaton, MD  alum & mag hydroxide-simeth (MAALOX/MYLANTA) 200-200-20 MG/5ML suspension Take 30 mLs by mouth every 6 (  six) hours as needed for indigestion, heartburn or flatulence. Patient not taking: Reported on 08/09/2015 02/11/15   Burgess Estelle, MD  benzonatate (TESSALON PERLES) 100 MG capsule Take 1 capsule (100 mg total) by mouth 3 (three) times daily as needed for cough. For cough 10/03/15   Ripudeep Krystal Eaton, MD  ceFAZolin (ANCEF) 2-4 GM/100ML-% IVPB Inject 100 mLs (2 g total) into the vein Every  Tuesday,Thursday,and Saturday with dialysis. X 2 doses 10/05/15   Ripudeep Krystal Eaton, MD  Darbepoetin Alfa (ARANESP) 100 MCG/0.5ML SOSY injection Inject 0.5 mLs (100 mcg total) into the vein every Thursday with hemodialysis. 03/17/15   Iline Oven, MD  dolutegravir (TIVICAY) 50 MG tablet Take 1 tablet (50 mg total) by mouth 2 (two) times daily. 08/09/15   Campbell Riches, MD  doxercalciferol (HECTOROL) 4 MCG/2ML injection Inject 1 mL (2 mcg total) into the vein Every Tuesday,Thursday,and Saturday with dialysis. 03/17/15   Iline Oven, MD  feeding supplement (BOOST / RESOURCE BREEZE) LIQD Take 1 Container by mouth 3 (three) times daily between meals. 03/17/15   Iline Oven, MD  gabapentin (NEURONTIN) 300 MG capsule TAKE 1 CAPSULE BY MOUTH THREE TIMES DAILY Patient not taking: Reported on 08/09/2015 12/28/14   Campbell Riches, MD  guaiFENesin (MUCINEX) 600 MG 12 hr tablet Take 1 tablet (600 mg total) by mouth 2 (two) times daily. 10/03/15   Ripudeep Krystal Eaton, MD  hydrOXYzine (ATARAX/VISTARIL) 25 MG tablet Take 1 tablet (25 mg total) by mouth every 6 (six) hours as needed for itching. 10/03/15   Ripudeep Krystal Eaton, MD  ibuprofen (ADVIL,MOTRIN) 200 MG tablet Take 200 mg by mouth every 6 (six) hours as needed for moderate pain.    Historical Provider, MD  lopinavir-ritonavir Vevelyn Francois) 400-100 MG/5ML solution Take 10 mLs (800 mg total) by mouth daily with breakfast. 08/09/15   Campbell Riches, MD  multivitamin (RENA-VIT) TABS tablet Take 1 tablet by mouth at bedtime. Patient not taking: Reported on 09/29/2015 08/09/15   Campbell Riches, MD  sulfamethoxazole-trimethoprim (BACTRIM) 400-80 MG/5ML injection Inject 10 mLs (160 mg of trimethoprim total) into the vein every Monday, Wednesday, and Friday. Patient not taking: Reported on 08/09/2015 06/09/15   Campbell Riches, MD  valACYclovir (VALTREX) 500 MG tablet Take 1 tablet (500 mg total) by mouth daily. Patient not taking: Reported on 09/29/2015 08/09/15    Campbell Riches, MD  VIREAD 300 MG tablet TAKE 1 TABLET BY MOUTH ONCE A WEEK 09/03/15   Campbell Riches, MD  zidovudine (RETROVIR) 100 MG capsule TAKE 3 CAPSULES(300 MG) BY MOUTH DAILY WITH BREAKFAST 10/08/15   Campbell Riches, MD    Family History Family History  Problem Relation Age of Onset  . Lung disease Mother     passed away at 96 with lung disease  . Hypertension Father     Social History Social History  Substance Use Topics  . Smoking status: Never Smoker  . Smokeless tobacco: Never Used  . Alcohol use No     Allergies   Shellfish-derived products and Vancomycin   Review of Systems Review of Systems  Constitutional: Negative for chills and fever.  Gastrointestinal: Positive for abdominal pain.  Skin: Positive for wound.  All other systems reviewed and are negative.    Physical Exam Updated Vital Signs BP 111/88 (BP Location: Right Arm)   Pulse (!) 122   Temp 97.8 F (36.6 C) (Oral)   Resp 20   LMP 01/16/2015   SpO2 100%  Physical Exam  Constitutional: She is oriented to person, place, and time. She appears well-developed and well-nourished. No distress.  Frail appearing but in no acute distress.   HENT:  Head: Normocephalic and atraumatic.  Eyes: Conjunctivae are normal.  Cardiovascular: Normal rate.   Pulmonary/Chest: Effort normal.  Abdominal: She exhibits no distension.  Neurological: She is alert and oriented to person, place, and time.  Skin: Skin is warm and dry.  Multiple areas of skin breakdown over the sacrum, upper buttocks and gluteal cleft. No abscess, no drainage. Some mild bleeding from the wouds.   Psychiatric: She has a normal mood and affect.  Nursing note and vitals reviewed.    ED Treatments / Results  DIAGNOSTIC STUDIES:  Oxygen Saturation is 10% on RA, normal by my interpretation.    COORDINATION OF CARE:  2:22 PM Discussed treatment plan with pt at bedside and pt agreed to plan.  Labs (all labs ordered are  listed, but only abnormal results are displayed) Labs Reviewed  COMPREHENSIVE METABOLIC PANEL - Abnormal; Notable for the following:       Result Value   Sodium 149 (*)    Potassium 5.6 (*)    Glucose, Bld 139 (*)    BUN 75 (*)    Creatinine, Ser 10.03 (*)    Calcium 8.5 (*)    Albumin 2.2 (*)    AST 45 (*)    Alkaline Phosphatase 169 (*)    Total Bilirubin 0.2 (*)    GFR calc non Af Amer 4 (*)    GFR calc Af Amer 5 (*)    Anion gap 16 (*)    All other components within normal limits  CBC WITH DIFFERENTIAL/PLATELET - Abnormal; Notable for the following:    RBC 3.75 (*)    Hemoglobin 9.8 (*)    HCT 32.8 (*)    MCHC 29.9 (*)    RDW 15.8 (*)    Platelets 127 (*)    Lymphs Abs 0.6 (*)    All other components within normal limits  I-STAT CG4 LACTIC ACID, ED - Abnormal; Notable for the following:    Lactic Acid, Venous 1.95 (*)    All other components within normal limits    EKG  EKG Interpretation  Date/Time:  Monday December 27 2015 14:37:29 EST Ventricular Rate:  98 PR Interval:  166 QRS Duration: 68 QT Interval:  380 QTC Calculation: 485 R Axis:   41 Text Interpretation:  Normal sinus rhythm T wave abnormality, consider inferolateral ischemia Prolonged QT Abnormal ECG Confirmed by Wilson Singer  MD, Annie Main [id number]) on 12/27/2015 2:56:39 PM       Radiology No results found.  Procedures Procedures (including critical care time)  Medications Ordered in ED Medications - No data to display   Initial Impression / Assessment and Plan / ED Course  I have reviewed the triage vital signs and the nursing notes.  Pertinent labs & imaging results that were available during my care of the patient were reviewed by me and considered in my medical decision making (see chart for details).  Clinical Course     40yF with multiple wounds/shallow ulcers to buttocks/sacral region. Noted during last hospitalization and felt to possibly be from genital herpes. Not characterisitic  appearance of shingles and has b/l. No abscess. No cellulitic skin changes. ID consulted during last hospitalization and did not recommend specific therapy.   Will provide with nonadherent dressings. Missed dialysis today and is hyperkalemic. EKG is abnormal. She has no symptoms which  remotely seem like anginal equivalent. May be related to hyperkalemia? Intervals ok aside from QTc being mildly prolonged. Will give kayexalate. She does not appear volume overloaded and has no respiratory complaints. Unless EKG shows significant changes, I think she should be fine for discharge from a renal perspective.   Tachycardia noted, but this appears to be chronic to some degree.   Vitals - 1 value per visit 12/27/2015 10/03/2015 10/02/2015 09/29/2015 08/18/2015  Pulse 122 109   102   Vitals - 1 value per visit 08/13/2015 08/09/2015 06/09/2015 05/26/2015  Pulse 120 109 92 88   Vitals - 1 value per visit 03/22/2015  Pulse 114    Wound care discuss. Stressed importance of compliance with medications and ID follow-up.   Final Clinical Impressions(s) / ED Diagnoses   Final diagnoses:  Visit for wound check  Hyperkalemia  ESRD (end stage renal disease) ([place])    New Prescriptions New Prescriptions   No medications on file   I personally preformed the services scribed in my presence. The recorded information has been reviewed is accurate. Virgel Manifold, MD.     Virgel Manifold, MD 12/27/15 [phone]

## 2015-12-29 DIAGNOSIS — N186 End stage renal disease: Secondary | ICD-10-CM | POA: Diagnosis not present

## 2015-12-29 DIAGNOSIS — D631 Anemia in chronic kidney disease: Secondary | ICD-10-CM | POA: Diagnosis not present

## 2015-12-29 DIAGNOSIS — N2581 Secondary hyperparathyroidism of renal origin: Secondary | ICD-10-CM | POA: Diagnosis not present

## 2015-12-30 DIAGNOSIS — N186 End stage renal disease: Secondary | ICD-10-CM | POA: Diagnosis not present

## 2015-12-30 DIAGNOSIS — N041 Nephrotic syndrome with focal and segmental glomerular lesions: Secondary | ICD-10-CM | POA: Diagnosis not present

## 2015-12-30 DIAGNOSIS — Z992 Dependence on renal dialysis: Secondary | ICD-10-CM | POA: Diagnosis not present

## 2015-12-31 DIAGNOSIS — D631 Anemia in chronic kidney disease: Secondary | ICD-10-CM | POA: Diagnosis not present

## 2015-12-31 DIAGNOSIS — N186 End stage renal disease: Secondary | ICD-10-CM | POA: Diagnosis not present

## 2015-12-31 DIAGNOSIS — N2581 Secondary hyperparathyroidism of renal origin: Secondary | ICD-10-CM | POA: Diagnosis not present

## 2016-01-03 DIAGNOSIS — N186 End stage renal disease: Secondary | ICD-10-CM | POA: Diagnosis not present

## 2016-01-03 DIAGNOSIS — N2581 Secondary hyperparathyroidism of renal origin: Secondary | ICD-10-CM | POA: Diagnosis not present

## 2016-01-03 DIAGNOSIS — D631 Anemia in chronic kidney disease: Secondary | ICD-10-CM | POA: Diagnosis not present

## 2016-01-05 DIAGNOSIS — D631 Anemia in chronic kidney disease: Secondary | ICD-10-CM | POA: Diagnosis not present

## 2016-01-05 DIAGNOSIS — N186 End stage renal disease: Secondary | ICD-10-CM | POA: Diagnosis not present

## 2016-01-05 DIAGNOSIS — N2581 Secondary hyperparathyroidism of renal origin: Secondary | ICD-10-CM | POA: Diagnosis not present

## 2016-01-07 DIAGNOSIS — N186 End stage renal disease: Secondary | ICD-10-CM | POA: Diagnosis not present

## 2016-01-07 DIAGNOSIS — D631 Anemia in chronic kidney disease: Secondary | ICD-10-CM | POA: Diagnosis not present

## 2016-01-07 DIAGNOSIS — N2581 Secondary hyperparathyroidism of renal origin: Secondary | ICD-10-CM | POA: Diagnosis not present

## 2016-01-10 DIAGNOSIS — D631 Anemia in chronic kidney disease: Secondary | ICD-10-CM | POA: Diagnosis not present

## 2016-01-10 DIAGNOSIS — N186 End stage renal disease: Secondary | ICD-10-CM | POA: Diagnosis not present

## 2016-01-10 DIAGNOSIS — N2581 Secondary hyperparathyroidism of renal origin: Secondary | ICD-10-CM | POA: Diagnosis not present

## 2016-01-12 DIAGNOSIS — D631 Anemia in chronic kidney disease: Secondary | ICD-10-CM | POA: Diagnosis not present

## 2016-01-12 DIAGNOSIS — N2581 Secondary hyperparathyroidism of renal origin: Secondary | ICD-10-CM | POA: Diagnosis not present

## 2016-01-12 DIAGNOSIS — N186 End stage renal disease: Secondary | ICD-10-CM | POA: Diagnosis not present

## 2016-01-14 DIAGNOSIS — D631 Anemia in chronic kidney disease: Secondary | ICD-10-CM | POA: Diagnosis not present

## 2016-01-14 DIAGNOSIS — N2581 Secondary hyperparathyroidism of renal origin: Secondary | ICD-10-CM | POA: Diagnosis not present

## 2016-01-14 DIAGNOSIS — N186 End stage renal disease: Secondary | ICD-10-CM | POA: Diagnosis not present

## 2016-01-17 DIAGNOSIS — N2581 Secondary hyperparathyroidism of renal origin: Secondary | ICD-10-CM | POA: Diagnosis not present

## 2016-01-17 DIAGNOSIS — D631 Anemia in chronic kidney disease: Secondary | ICD-10-CM | POA: Diagnosis not present

## 2016-01-17 DIAGNOSIS — N186 End stage renal disease: Secondary | ICD-10-CM | POA: Diagnosis not present

## 2016-01-19 DIAGNOSIS — D631 Anemia in chronic kidney disease: Secondary | ICD-10-CM | POA: Diagnosis not present

## 2016-01-19 DIAGNOSIS — N2581 Secondary hyperparathyroidism of renal origin: Secondary | ICD-10-CM | POA: Diagnosis not present

## 2016-01-19 DIAGNOSIS — N186 End stage renal disease: Secondary | ICD-10-CM | POA: Diagnosis not present

## 2016-01-21 DIAGNOSIS — N2581 Secondary hyperparathyroidism of renal origin: Secondary | ICD-10-CM | POA: Diagnosis not present

## 2016-01-21 DIAGNOSIS — N186 End stage renal disease: Secondary | ICD-10-CM | POA: Diagnosis not present

## 2016-01-21 DIAGNOSIS — D631 Anemia in chronic kidney disease: Secondary | ICD-10-CM | POA: Diagnosis not present

## 2016-01-23 DIAGNOSIS — D631 Anemia in chronic kidney disease: Secondary | ICD-10-CM | POA: Diagnosis not present

## 2016-01-23 DIAGNOSIS — N186 End stage renal disease: Secondary | ICD-10-CM | POA: Diagnosis not present

## 2016-01-23 DIAGNOSIS — N2581 Secondary hyperparathyroidism of renal origin: Secondary | ICD-10-CM | POA: Diagnosis not present

## 2016-01-26 DIAGNOSIS — D631 Anemia in chronic kidney disease: Secondary | ICD-10-CM | POA: Diagnosis not present

## 2016-01-26 DIAGNOSIS — N2581 Secondary hyperparathyroidism of renal origin: Secondary | ICD-10-CM | POA: Diagnosis not present

## 2016-01-26 DIAGNOSIS — N186 End stage renal disease: Secondary | ICD-10-CM | POA: Diagnosis not present

## 2016-01-27 DIAGNOSIS — T82858A Stenosis of vascular prosthetic devices, implants and grafts, initial encounter: Secondary | ICD-10-CM | POA: Diagnosis not present

## 2016-01-27 DIAGNOSIS — Z992 Dependence on renal dialysis: Secondary | ICD-10-CM | POA: Diagnosis not present

## 2016-01-27 DIAGNOSIS — N186 End stage renal disease: Secondary | ICD-10-CM | POA: Diagnosis not present

## 2016-01-27 DIAGNOSIS — I871 Compression of vein: Secondary | ICD-10-CM | POA: Diagnosis not present

## 2016-01-28 DIAGNOSIS — D631 Anemia in chronic kidney disease: Secondary | ICD-10-CM | POA: Diagnosis not present

## 2016-01-28 DIAGNOSIS — N186 End stage renal disease: Secondary | ICD-10-CM | POA: Diagnosis not present

## 2016-01-28 DIAGNOSIS — N2581 Secondary hyperparathyroidism of renal origin: Secondary | ICD-10-CM | POA: Diagnosis not present

## 2016-01-30 DIAGNOSIS — N041 Nephrotic syndrome with focal and segmental glomerular lesions: Secondary | ICD-10-CM | POA: Diagnosis not present

## 2016-01-30 DIAGNOSIS — Z992 Dependence on renal dialysis: Secondary | ICD-10-CM | POA: Diagnosis not present

## 2016-01-30 DIAGNOSIS — N186 End stage renal disease: Secondary | ICD-10-CM | POA: Diagnosis not present

## 2016-02-02 ENCOUNTER — Emergency Department (HOSPITAL_COMMUNITY)
Admission: EM | Admit: 2016-02-02 | Discharge: 2016-02-02 | Disposition: A | Discharge status: Home / Self Care | Payer: Medicare Other | Source: Home / Self Care | Attending: Emergency Medicine | Admitting: Emergency Medicine

## 2016-02-02 ENCOUNTER — Emergency Department (HOSPITAL_COMMUNITY): Payer: Medicare Other

## 2016-02-02 DIAGNOSIS — A419 Sepsis, unspecified organism: Secondary | ICD-10-CM | POA: Diagnosis not present

## 2016-02-02 DIAGNOSIS — R059 Cough, unspecified: Secondary | ICD-10-CM

## 2016-02-02 DIAGNOSIS — J189 Pneumonia, unspecified organism: Secondary | ICD-10-CM

## 2016-02-02 DIAGNOSIS — Z79899 Other long term (current) drug therapy: Secondary | ICD-10-CM

## 2016-02-02 DIAGNOSIS — Z992 Dependence on renal dialysis: Secondary | ICD-10-CM | POA: Insufficient documentation

## 2016-02-02 DIAGNOSIS — R05 Cough: Secondary | ICD-10-CM

## 2016-02-02 DIAGNOSIS — N186 End stage renal disease: Secondary | ICD-10-CM

## 2016-02-02 DIAGNOSIS — I776 Arteritis, unspecified: Secondary | ICD-10-CM | POA: Diagnosis not present

## 2016-02-02 DIAGNOSIS — D696 Thrombocytopenia, unspecified: Secondary | ICD-10-CM | POA: Diagnosis not present

## 2016-02-02 DIAGNOSIS — D649 Anemia, unspecified: Secondary | ICD-10-CM | POA: Diagnosis not present

## 2016-02-02 DIAGNOSIS — E43 Unspecified severe protein-calorie malnutrition: Secondary | ICD-10-CM | POA: Diagnosis not present

## 2016-02-02 DIAGNOSIS — G9340 Encephalopathy, unspecified: Secondary | ICD-10-CM | POA: Diagnosis not present

## 2016-02-02 DIAGNOSIS — T68XXXA Hypothermia, initial encounter: Secondary | ICD-10-CM | POA: Diagnosis not present

## 2016-02-02 DIAGNOSIS — B20 Human immunodeficiency virus [HIV] disease: Secondary | ICD-10-CM | POA: Diagnosis not present

## 2016-02-02 DIAGNOSIS — A0472 Enterocolitis due to Clostridium difficile, not specified as recurrent: Secondary | ICD-10-CM | POA: Diagnosis not present

## 2016-02-02 DIAGNOSIS — G9349 Other encephalopathy: Secondary | ICD-10-CM | POA: Diagnosis not present

## 2016-02-02 LAB — CBC
HEMATOCRIT: 31.2 % — AB (ref 36.0–46.0)
HEMOGLOBIN: 9.7 g/dL — AB (ref 12.0–15.0)
MCH: 26.4 pg (ref 26.0–34.0)
MCHC: 31.1 g/dL (ref 30.0–36.0)
MCV: 84.8 fL (ref 78.0–100.0)
Platelets: 55 10*3/uL — ABNORMAL LOW (ref 150–400)
RBC: 3.68 MIL/uL — ABNORMAL LOW (ref 3.87–5.11)
RDW: 17.5 % — AB (ref 11.5–15.5)
WBC: 7 10*3/uL (ref 4.0–10.5)

## 2016-02-02 LAB — BASIC METABOLIC PANEL
ANION GAP: 18 — AB (ref 5–15)
BUN: 78 mg/dL — AB (ref 6–20)
CO2: 21 mmol/L — ABNORMAL LOW (ref 22–32)
Calcium: 9.3 mg/dL (ref 8.9–10.3)
Chloride: 109 mmol/L (ref 101–111)
Creatinine, Ser: 8.81 mg/dL — ABNORMAL HIGH (ref 0.44–1.00)
GFR calc Af Amer: 6 mL/min — ABNORMAL LOW (ref >60)
GFR, EST NON AFRICAN AMERICAN: 5 mL/min — AB (ref >60)
Glucose, Bld: 112 mg/dL — ABNORMAL HIGH (ref 65–99)
POTASSIUM: 5.5 mmol/L — AB (ref 3.5–5.1)
SODIUM: 148 mmol/L — AB (ref 135–145)

## 2016-02-02 LAB — CBG MONITORING, ED: GLUCOSE-CAPILLARY: 150 mg/dL — AB (ref 65–99)

## 2016-02-02 MED ORDER — SODIUM POLYSTYRENE SULFONATE 15 GM/60ML PO SUSP
30.0000 g | Freq: Once | ORAL | Status: AC
  Administered 2016-02-02: 30 g via ORAL
  Filled 2016-02-02: qty 120

## 2016-02-02 MED ORDER — LEVOFLOXACIN 500 MG PO TABS: 500.0000 mg | ORAL_TABLET | ORAL | 0 refills | Status: DC

## 2016-02-02 MED ORDER — ACYCLOVIR 5 % EX OINT: 1.0000 "application " | TOPICAL_OINTMENT | CUTANEOUS | 0 refills | Status: DC | PRN

## 2016-02-02 MED ORDER — LEVOFLOXACIN 750 MG PO TABS
750.0000 mg | ORAL_TABLET | Freq: Once | ORAL | Status: AC
  Administered 2016-02-02: 750 mg via ORAL
  Filled 2016-02-02: qty 1

## 2016-02-02 NOTE — ED Provider Notes (Signed)
[facility] DEPT Provider Note   CSN: [id number] Arrival date & time: 02/02/16  1112     History   Chief Complaint Chief Complaint  Patient presents with  . Weakness    HPI Paula Becker is a 41 y.o. female.  The history is provided by the patient, medical records and a relative. No language interpreter was used.  Weakness  Primary symptoms include no dizziness. Pertinent negatives include no shortness of breath, no vomiting and no headaches.   Paula Becker is a 41 y.o. female  with a PMH of ESRD on dialysis MWF,  HIV with last CD4 count of 20 four months ago who presents to the Emergency Department complaining of generalized weakness for the last 3-4 days. Associated symptoms include chest congestion and nonproductive cough for the last two days. She missed dialysis today and Monday, with last dialysis on Friday (5 days ago). She has not taken her daily meds yesterday or today because she felt so weak. She denies fever, nausea, vomiting, abdominal pain, shortness of breath or dysuria.   Past Medical History:  Diagnosis Date  . Anemia of chronic disease   . ESRD on dialysis ([place])   . Herpes   . History of noncompliance with medical treatment   . HIV (human immunodeficiency virus infection) ([place])   . MRSA (methicillin resistant staph aureus) culture positive   . Necrotizing pneumonia ([place]) 07/2010  . Pneumonia 06/23/11   RLL patchy, nodular lung disease  . Renal disorder     Patient Active Problem List   Diagnosis Date Noted  . HIV disease ([place])   . HCAP (healthcare-associated pneumonia)   . ESRD on dialysis ([place])   . 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 Pressure ulcer 03/10/2015  . Secondary hyperparathyroidism of renal origin ([place]) 03/10/2015  . Diarrhea 02/06/2015  . 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 Noncompliance with medication treatment due to difficulty with route of medication administration 03/15/2011  . Folliculitis [id number]  . HSV  (herpes simplex virus) anogenital infection 08/19/2010  . Anemia of chronic disease 05/31/2010  . Severe protein-calorie malnutrition ([place]) 05/31/2010  . Unspecified episodic mood disorder 05/31/2010    Past Surgical History:  Procedure Laterality Date  . BASCILIC VEIN TRANSPOSITION Left 03/16/2015   Procedure: BASCILIC VEIN TRANSPOSITION LEFT UPPER ARM;  Surgeon: Angelia Mould, MD;  Location: [facility];  Service: Vascular;  Laterality: Left;  . BREAST SURGERY  03/2010   right; "don't know what they did"  . CESAREAN SECTION  1998  . INSERTION OF DIALYSIS CATHETER N/A 03/16/2015   Procedure: INSERTION OF DIALYSIS CATHETER RIGHT INTERNAL JUGULAR;  Surgeon: Angelia Mould, MD;  Location: [facility];  Service: Vascular;  Laterality: N/A;  . VIDEO BRONCHOSCOPY  06/28/2011   Procedure: VIDEO BRONCHOSCOPY WITH FLUORO;  Surgeon: Chesley Mires, MD;  Location: [facility];  Service: Cardiopulmonary;  Laterality: Bilateral;    OB History    No data available       Home Medications    Prior to Admission medications   Medication Sig Start Date End Date Taking? Authorizing Provider  albuterol (PROVENTIL HFA;VENTOLIN HFA) 108 (90 Base) MCG/ACT inhaler Inhale 2 puffs into the lungs every 6 (six) hours as needed for wheezing or shortness of breath. 10/03/15  Yes Ripudeep Krystal Eaton, MD  alum & mag hydroxide-simeth (MAALOX/MYLANTA) 200-200-20 MG/5ML suspension Take 30 mLs by mouth every 6 (six) hours as needed for indigestion, heartburn or flatulence. 02/11/15  Yes Burgess Estelle, MD  Darbepoetin Alfa (ARANESP) 100 MCG/0.5ML  SOSY injection Inject 0.5 mLs (100 mcg total) into the vein every Thursday with hemodialysis. 03/17/15  Yes Iline Oven, MD  dolutegravir (TIVICAY) 50 MG tablet Take 1 tablet (50 mg total) by mouth 2 (two) times daily. 08/09/15  Yes Campbell Riches, MD  doxercalciferol (HECTOROL) 4 MCG/2ML injection Inject 1 mL (2 mcg total) into the vein Every Tuesday,Thursday,and Saturday with  dialysis. 03/17/15  Yes Iline Oven, MD  feeding supplement (BOOST / RESOURCE BREEZE) LIQD Take 1 Container by mouth 3 (three) times daily between meals. 03/17/15  Yes Iline Oven, MD  guaiFENesin (MUCINEX) 600 MG 12 hr tablet Take 1 tablet (600 mg total) by mouth 2 (two) times daily. 10/03/15  Yes Ripudeep Krystal Eaton, MD  HYDROcodone-acetaminophen (NORCO/VICODIN) 5-325 MG tablet Take 1 tablet by mouth every 4 (four) hours as needed. 12/27/15  Yes Virgel Manifold, MD  hydrOXYzine (ATARAX/VISTARIL) 25 MG tablet Take 1 tablet (25 mg total) by mouth every 6 (six) hours as needed for itching. 10/03/15  Yes Ripudeep Krystal Eaton, MD  ibuprofen (ADVIL,MOTRIN) 200 MG tablet Take 200 mg by mouth every 6 (six) hours as needed for moderate pain.   Yes Historical Provider, MD  lopinavir-ritonavir Vevelyn Francois) 400-100 MG/5ML solution Take 10 mLs (800 mg total) by mouth daily with breakfast. 08/09/15  Yes Campbell Riches, MD  zidovudine (RETROVIR) 100 MG capsule TAKE 3 CAPSULES(300 MG) BY MOUTH DAILY WITH BREAKFAST 10/08/15  Yes Campbell Riches, MD  acyclovir ointment (ZOVIRAX) 5 % Apply 1 application topically every 4 (four) hours as needed. 02/02/16   Jaime Pilcher Ward, PA-C  benzonatate (TESSALON PERLES) 100 MG capsule Take 1 capsule (100 mg total) by mouth 3 (three) times daily as needed for cough. For cough Patient not taking: Reported on 02/02/2016 10/03/15   Ripudeep Krystal Eaton, MD  ceFAZolin (ANCEF) 2-4 GM/100ML-% IVPB Inject 100 mLs (2 g total) into the vein Every Tuesday,Thursday,and Saturday with dialysis. X 2 doses Patient not taking: Reported on 02/02/2016 10/05/15   Ripudeep Krystal Eaton, MD  gabapentin (NEURONTIN) 300 MG capsule TAKE 1 CAPSULE BY MOUTH THREE TIMES DAILY Patient not taking: Reported on 02/02/2016 12/28/14   Campbell Riches, MD  levofloxacin (LEVAQUIN) 500 MG tablet Take 1 tablet (500 mg total) by mouth every other day. 02/02/16   Ozella Almond Ward, PA-C  multivitamin (RENA-VIT) TABS tablet Take 1 tablet by mouth  at bedtime. Patient not taking: Reported on 09/29/2015 08/09/15   Campbell Riches, MD  sulfamethoxazole-trimethoprim (BACTRIM) 400-80 MG/5ML injection Inject 10 mLs (160 mg of trimethoprim total) into the vein every Monday, Wednesday, and Friday. Patient not taking: Reported on 08/09/2015 06/09/15   Campbell Riches, MD  valACYclovir (VALTREX) 500 MG tablet Take 1 tablet (500 mg total) by mouth daily. Patient not taking: Reported on 09/29/2015 08/09/15   Campbell Riches, MD  VIREAD 300 MG tablet TAKE 1 TABLET BY MOUTH ONCE A WEEK 09/03/15   Campbell Riches, MD    Family History Family History  Problem Relation Age of Onset  . Lung disease Mother     passed away at 48 with lung disease  . Hypertension Father     Social History Social History  Substance Use Topics  . Smoking status: Never Smoker  . Smokeless tobacco: Never Used  . Alcohol use No     Allergies   Shellfish-derived products and Vancomycin   Review of Systems Review of Systems  Constitutional: Negative for chills and fever.  HENT: Positive  for congestion.   Eyes: Negative for visual disturbance.  Respiratory: Positive for cough. Negative for shortness of breath.   Cardiovascular: Negative.   Gastrointestinal: Negative for abdominal pain, nausea and vomiting.  Genitourinary: Negative for dysuria.  Musculoskeletal: Negative for back pain and neck pain.  Skin: Negative for rash.  Neurological: Positive for weakness. Negative for dizziness and headaches.     Physical Exam Updated Vital Signs BP 131/98   Pulse 81   Temp 98.4 F (36.9 C) (Oral)   Resp 19   LMP 01/16/2015   SpO2 100%   Physical Exam  Constitutional: She is oriented to person, place, and time. No distress.  Chronically ill-appearing, calm and comfortable.   HENT:  Head: Normocephalic and atraumatic.  Cardiovascular: Normal rate, regular rhythm and normal heart sounds.   No murmur heard. Pulmonary/Chest: Effort normal and breath sounds  normal. No respiratory distress. She has no wheezes.  Abdominal: Soft. Bowel sounds are normal. She exhibits no distension. There is no tenderness.  Musculoskeletal: She exhibits no edema.  Neurological: She is alert and oriented to person, place, and time.  Skin: Skin is warm and dry.  Nursing note and vitals reviewed.    ED Treatments / Results  Labs (all labs ordered are listed, but only abnormal results are displayed) Labs Reviewed  BASIC METABOLIC PANEL - Abnormal; Notable for the following:       Result Value   Sodium 148 (*)    Potassium 5.5 (*)    CO2 21 (*)    Glucose, Bld 112 (*)    BUN 78 (*)    Creatinine, Ser 8.81 (*)    GFR calc non Af Amer 5 (*)    GFR calc Af Amer 6 (*)    Anion gap 18 (*)    All other components within normal limits  CBC - Abnormal; Notable for the following:    RBC 3.68 (*)    Hemoglobin 9.7 (*)    HCT 31.2 (*)    RDW 17.5 (*)    Platelets 55 (*)    All other components within normal limits  CBG MONITORING, ED - Abnormal; Notable for the following:    Glucose-Capillary 150 (*)    All other components within normal limits    EKG  EKG Interpretation  Date/Time:  Wednesday February 02 2016 11:45:08 EST Ventricular Rate:  68 PR Interval:  232 QRS Duration: 82 QT Interval:  466 QTC Calculation: 495 R Axis:   55 Text Interpretation:  Sinus rhythm with 1st degree A-V block Prolonged QT Abnormal ECG since last tracing no significant change Confirmed by Eulis Foster  MD, ELLIOTT [phone]) on 02/02/2016 2:13:18 PM       Radiology Dg Chest 2 View  Result Date: 02/02/2016 CLINICAL DATA:  Productive cough and nausea for 1 day, history end-stage renal disease, HIV, pneumonia EXAM: CHEST  2 VIEW COMPARISON:  09/29/2015 FINDINGS: Normal heart size, mediastinal contours, and pulmonary vascularity. Atherosclerotic calcification aorta. Hyperinflated lungs with bibasilar bronchiectasis. Bibasilar infiltrates versus atelectasis, increased on RIGHT. Tiny RIGHT  pleural effusion, new. Mild diffuse interstitial prominence unchanged. No pneumothorax. Bones unremarkable. IMPRESSION: Emphysematous changes with bibasilar bronchiectasis, bibasilar infiltrates versus atelectasis increased on RIGHT, and tiny RIGHT pleural effusion. Electronically Signed   By: Lavonia Dana M.D.   On: 02/02/2016 13:21    Procedures Procedures (including critical care time)  Medications Ordered in ED Medications  sodium polystyrene (KAYEXALATE) 15 GM/60ML suspension 30 g (30 g Oral Given 02/02/16 1505)  levofloxacin (LEVAQUIN)  tablet 750 mg (750 mg Oral Given 02/02/16 1505)     Initial Impression / Assessment and Plan / ED Course  I have reviewed the triage vital signs and the nursing notes.  Pertinent labs & imaging results that were available during my care of the patient were reviewed by me and considered in my medical decision making (see chart for details).  Clinical Course    Paula Becker is a 41 y.o. female who presents to ED for cough, congestion and weakness. Hx of HIV with last CD4 count of 20 and ESRD on dialysis MWF. Appears to have difficulties with medication compliance. She also missed dialysis Monday and today as she did not feel well enough to attend. On exam, patient is afebrile with reassuring vital signs. CXR Shows bibasilar infiltrate versus atelectasis on the right and very small pleural effusion on the right as well. Given her immune compromised state, will treat as healthcare acquired pneumonia. Labs were reviewed and patient noted to have potassium of 5.5. EKG reviewed with attending - no significant changes. Case discussed with nephrology who recommends dose of Kayexalate in ED and strongly encourage patient to make it to dialysis on Friday. Also discussed recommended antibiotic dosing for pneumonia with nephrology. Will treat with Levaquin, loading dose of 750 in ED today then 500 mg every other day. Patient's family requesting information about home health.  Case management consulted who spoke with patient. Bedside commode and walker recommended to help with home needs which I ordered. Evaluation does not show pathology that would require ongoing emergent intervention or inpatient treatment. Reasons to return to ER were discussed and all questions were answered.    Patient seen by and discussed with Dr. Eulis Foster who agrees with treatment plan.   Final Clinical Impressions(s) / ED Diagnoses   Final diagnoses:  Cough  HCAP (healthcare-associated pneumonia)    New Prescriptions Discharge Medication List as of 02/02/2016  3:27 PM    START taking these medications   Details  levofloxacin (LEVAQUIN) 500 MG tablet Take 1 tablet (500 mg total) by mouth every other day., Starting Wed 02/02/2016, Print         [employer] Ward, PA-C 02/02/16 [place], MD 02/03/16 2111

## 2016-02-02 NOTE — ED Notes (Signed)
Axillary temp will not register.  Pt currently eating ice.

## 2016-02-02 NOTE — ED Notes (Signed)
Still awaiting case management.  Pt dressed and ready for discharge after case management consult.

## 2016-02-02 NOTE — ED Notes (Signed)
Pt does not make urine - dialysis patient

## 2016-02-02 NOTE — ED Notes (Signed)
Attempted blood draw with no success; patient stated IV team usually is called; patient states she is ok with waiting until she is in a room for IV team to see her

## 2016-02-02 NOTE — ED Notes (Signed)
Pt returned to room from imaging department.  

## 2016-02-02 NOTE — ED Notes (Addendum)
Pt incontinent of urine and loose stool.  RN and EMT in pt room for approx 45 min cleaning pt.  Pt would say that she was finished having BM and would have multiple episodes of diarrhea to follow.  Pt cleaned multiple times and adult diaper placed on pt.  Pt has large sores on vaginal and rectal area.  Barrier cream placed.  Pt in paper scrub pants.  PA notified.

## 2016-02-02 NOTE — ED Notes (Addendum)
Pt was given discharge instructions and said she would get changed into her shirt.  RN went back to check on pt and she is still lying in gown and eating a [employer] cheeseburger and chewing on ice.  Family at bedside and states pt needs a home health nurse to come help her.  PA notified and at bedside.

## 2016-02-02 NOTE — ED Triage Notes (Signed)
Has been weak x several days missed dialysis Monday and today, she cannot walk  Well and per family has not been taking her meds

## 2016-02-02 NOTE — Discharge Instructions (Signed)
It is very important that you take your medication as directed. It is also very important that you go to dialysis on Friday. Please call your primary physician to schedule a follow up appointment for discussion of today's visit. Return to ER for new or worsening symptoms, any additional concerns.

## 2016-02-02 NOTE — Care Management Note (Addendum)
Case Management Note  Patient Details  Name: Paula Becker MRN: [id number] Date of Birth: 12/03/1975  Subjective/Objective:      Patient presented to [facility] ED with c/o of weakness and diarrhea, having missed HD today.  Patient on HD M/W/F at [facility]. CM noted that patient has had 3 ED visits in the  Past 6 months. Patient is HIV + and is followed at the [facility] clinic As per record patient has been non-compliant with her antiviral drugs.           Action/Plan: CM met with patient at bedside,patient confirmed information. Patient states, she is has missed HD due to generalized weakness. Patient's family concerned about patient weakness and verbalized she may need a nurse in the home to help with chores. This CM explained that [facility] are skilled services which are covered by insurance vs Custodial care which are  private paid services, patient and family verbalized understanding Patient states she has a Designer, television/film set with [facility] clinic occasionally. Family states, patient is having difficulty ambulating, Patient would benefit from a walker and bedside commode, patient agrees. Discussed [facility], patient is agreeable, referral placed to [facility] for evaluation. Offered to contact [facility] clinic CM to schedule follow up appointment as well. Patient verbalized understanding and appreciation  For the assistance teach back done. DME delivered to patient in ED room prior to discharge Updated EDP on care transition  Plan. No further ED CM needs identified.  Expected Discharge Date:       02/02/16           Expected Discharge Plan:  Home/Self Care  In-House Referral:     Discharge planning Services  CM Consult ([place])  Post Acute Care Choice:    Choice offered to:     DME Arranged:  3-N-1, Walker rolling DME Agency:  [place]:    [facility] Agency:     Status of Service:  Completed, signed off  If discussed at [place] of  Stay Meetings, dates discussed:    Additional CommentsLaurena Slimmer, RN 02/02/2016, 6:40 PM

## 2016-02-02 NOTE — ED Provider Notes (Signed)
  Face-to-face evaluation   History: She presents for evaluation of weakness. She states that she has chronic difficulty swallowing pills.  Physical exam: Under appearing patient who is alert and cooperative. There is no respiratory distress. She was not coughing when I examined her.  Medical screening examination/treatment/procedure(s) were conducted as a shared visit with non-physician practitioner(s) and myself.  I personally evaluated the patient during the encounter   Daleen Bo, MD 02/03/16 2111

## 2016-02-02 NOTE — ED Notes (Signed)
Case manager at bedside 

## 2016-02-02 NOTE — ED Notes (Addendum)
RN went back to patient's room to assist in getting her to car and pt and family were gone.  Discharge instructions were given earlier.

## 2016-02-03 ENCOUNTER — Encounter (HOSPITAL_COMMUNITY): Payer: Self-pay | Admitting: Emergency Medicine

## 2016-02-03 ENCOUNTER — Emergency Department (HOSPITAL_COMMUNITY): Payer: Medicare Other

## 2016-02-03 ENCOUNTER — Inpatient Hospital Stay (HOSPITAL_COMMUNITY)
Admission: EM | Admit: 2016-02-03 | Discharge: 2016-02-09 | DRG: 974 | Disposition: A | Discharge status: Hospice | Payer: Medicare Other | Attending: Internal Medicine | Admitting: Internal Medicine

## 2016-02-03 ENCOUNTER — Other Ambulatory Visit: Payer: Self-pay

## 2016-02-03 DIAGNOSIS — D696 Thrombocytopenia, unspecified: Secondary | ICD-10-CM | POA: Diagnosis present

## 2016-02-03 DIAGNOSIS — Z8614 Personal history of Methicillin resistant Staphylococcus aureus infection: Secondary | ICD-10-CM

## 2016-02-03 DIAGNOSIS — C8589 Other specified types of non-Hodgkin lymphoma, extranodal and solid organ sites: Secondary | ICD-10-CM | POA: Diagnosis present

## 2016-02-03 DIAGNOSIS — L8990 Pressure ulcer of unspecified site, unspecified stage: Secondary | ICD-10-CM | POA: Diagnosis present

## 2016-02-03 DIAGNOSIS — Z8249 Family history of ischemic heart disease and other diseases of the circulatory system: Secondary | ICD-10-CM

## 2016-02-03 DIAGNOSIS — Z992 Dependence on renal dialysis: Secondary | ICD-10-CM

## 2016-02-03 DIAGNOSIS — B0089 Other herpesviral infection: Secondary | ICD-10-CM | POA: Diagnosis present

## 2016-02-03 DIAGNOSIS — J470 Bronchiectasis with acute lower respiratory infection: Secondary | ICD-10-CM | POA: Diagnosis present

## 2016-02-03 DIAGNOSIS — Z79899 Other long term (current) drug therapy: Secondary | ICD-10-CM

## 2016-02-03 DIAGNOSIS — B20 Human immunodeficiency virus [HIV] disease: Secondary | ICD-10-CM | POA: Diagnosis present

## 2016-02-03 DIAGNOSIS — Z66 Do not resuscitate: Secondary | ICD-10-CM | POA: Diagnosis present

## 2016-02-03 DIAGNOSIS — J984 Other disorders of lung: Secondary | ICD-10-CM | POA: Diagnosis present

## 2016-02-03 DIAGNOSIS — E871 Hypo-osmolality and hyponatremia: Secondary | ICD-10-CM | POA: Diagnosis present

## 2016-02-03 DIAGNOSIS — E43 Unspecified severe protein-calorie malnutrition: Secondary | ICD-10-CM | POA: Diagnosis present

## 2016-02-03 DIAGNOSIS — I776 Arteritis, unspecified: Secondary | ICD-10-CM | POA: Diagnosis present

## 2016-02-03 DIAGNOSIS — Z515 Encounter for palliative care: Secondary | ICD-10-CM | POA: Diagnosis present

## 2016-02-03 DIAGNOSIS — I120 Hypertensive chronic kidney disease with stage 5 chronic kidney disease or end stage renal disease: Secondary | ICD-10-CM | POA: Diagnosis present

## 2016-02-03 DIAGNOSIS — A419 Sepsis, unspecified organism: Principal | ICD-10-CM | POA: Diagnosis present

## 2016-02-03 DIAGNOSIS — G9340 Encephalopathy, unspecified: Secondary | ICD-10-CM | POA: Diagnosis present

## 2016-02-03 DIAGNOSIS — R0602 Shortness of breath: Secondary | ICD-10-CM

## 2016-02-03 DIAGNOSIS — Y95 Nosocomial condition: Secondary | ICD-10-CM | POA: Diagnosis present

## 2016-02-03 DIAGNOSIS — I6782 Cerebral ischemia: Secondary | ICD-10-CM | POA: Diagnosis present

## 2016-02-03 DIAGNOSIS — T68XXXA Hypothermia, initial encounter: Secondary | ICD-10-CM

## 2016-02-03 DIAGNOSIS — Z7189 Other specified counseling: Secondary | ICD-10-CM

## 2016-02-03 DIAGNOSIS — G9349 Other encephalopathy: Secondary | ICD-10-CM | POA: Diagnosis present

## 2016-02-03 DIAGNOSIS — L98411 Non-pressure chronic ulcer of buttock limited to breakdown of skin: Secondary | ICD-10-CM | POA: Diagnosis present

## 2016-02-03 DIAGNOSIS — D631 Anemia in chronic kidney disease: Secondary | ICD-10-CM | POA: Diagnosis present

## 2016-02-03 DIAGNOSIS — N186 End stage renal disease: Secondary | ICD-10-CM

## 2016-02-03 DIAGNOSIS — Z836 Family history of other diseases of the respiratory system: Secondary | ICD-10-CM

## 2016-02-03 DIAGNOSIS — D638 Anemia in other chronic diseases classified elsewhere: Secondary | ICD-10-CM | POA: Diagnosis present

## 2016-02-03 DIAGNOSIS — Z9114 Patient's other noncompliance with medication regimen: Secondary | ICD-10-CM

## 2016-02-03 DIAGNOSIS — R680 Hypothermia, not associated with low environmental temperature: Secondary | ICD-10-CM | POA: Diagnosis present

## 2016-02-03 DIAGNOSIS — Z6822 Body mass index (BMI) 22.0-22.9, adult: Secondary | ICD-10-CM

## 2016-02-03 DIAGNOSIS — J189 Pneumonia, unspecified organism: Secondary | ICD-10-CM | POA: Diagnosis present

## 2016-02-03 DIAGNOSIS — Z881 Allergy status to other antibiotic agents status: Secondary | ICD-10-CM

## 2016-02-03 DIAGNOSIS — Z91013 Allergy to seafood: Secondary | ICD-10-CM

## 2016-02-03 DIAGNOSIS — N2581 Secondary hyperparathyroidism of renal origin: Secondary | ICD-10-CM | POA: Diagnosis present

## 2016-02-03 DIAGNOSIS — N051 Unspecified nephritic syndrome with focal and segmental glomerular lesions: Secondary | ICD-10-CM | POA: Diagnosis present

## 2016-02-03 DIAGNOSIS — A609 Anogenital herpesviral infection, unspecified: Secondary | ICD-10-CM | POA: Diagnosis present

## 2016-02-03 DIAGNOSIS — R1310 Dysphagia, unspecified: Secondary | ICD-10-CM | POA: Diagnosis present

## 2016-02-03 DIAGNOSIS — A0472 Enterocolitis due to Clostridium difficile, not specified as recurrent: Secondary | ICD-10-CM | POA: Diagnosis present

## 2016-02-03 DIAGNOSIS — D649 Anemia, unspecified: Secondary | ICD-10-CM | POA: Diagnosis not present

## 2016-02-03 HISTORY — DX: Disorder of kidney and ureter, unspecified: N28.9

## 2016-02-03 LAB — COMPREHENSIVE METABOLIC PANEL
ALK PHOS: 277 U/L — AB (ref 38–126)
ALT: 54 U/L (ref 14–54)
AST: 50 U/L — ABNORMAL HIGH (ref 15–41)
Albumin: 2.5 g/dL — ABNORMAL LOW (ref 3.5–5.0)
Anion gap: 13 (ref 5–15)
BILIRUBIN TOTAL: 0.1 mg/dL — AB (ref 0.3–1.2)
BUN: 26 mg/dL — ABNORMAL HIGH (ref 6–20)
CALCIUM: 8.6 mg/dL — AB (ref 8.9–10.3)
CO2: 29 mmol/L (ref 22–32)
CREATININE: 4.07 mg/dL — AB (ref 0.44–1.00)
Chloride: 98 mmol/L — ABNORMAL LOW (ref 101–111)
GFR calc non Af Amer: 13 mL/min — ABNORMAL LOW (ref >60)
GFR, EST AFRICAN AMERICAN: 15 mL/min — AB (ref >60)
Glucose, Bld: 83 mg/dL (ref 65–99)
Potassium: 4.1 mmol/L (ref 3.5–5.1)
SODIUM: 140 mmol/L (ref 135–145)
Total Protein: 8.4 g/dL — ABNORMAL HIGH (ref 6.5–8.1)

## 2016-02-03 LAB — CBC WITH DIFFERENTIAL/PLATELET
Basophils Absolute: 0 10*3/uL (ref 0.0–0.1)
Basophils Relative: 0 %
EOS ABS: 0.4 10*3/uL (ref 0.0–0.7)
Eosinophils Relative: 6 %
HEMATOCRIT: 28.2 % — AB (ref 36.0–46.0)
HEMOGLOBIN: 8.9 g/dL — AB (ref 12.0–15.0)
LYMPHS ABS: 0.5 10*3/uL — AB (ref 0.7–4.0)
LYMPHS PCT: 9 %
MCH: 26.8 pg (ref 26.0–34.0)
MCHC: 31.6 g/dL (ref 30.0–36.0)
MCV: 84.9 fL (ref 78.0–100.0)
Monocytes Absolute: 0.4 10*3/uL (ref 0.1–1.0)
Monocytes Relative: 7 %
NEUTROS PCT: 78 %
Neutro Abs: 4.7 10*3/uL (ref 1.7–7.7)
Platelets: 51 10*3/uL — ABNORMAL LOW (ref 150–400)
RBC: 3.32 MIL/uL — AB (ref 3.87–5.11)
RDW: 17.6 % — ABNORMAL HIGH (ref 11.5–15.5)
WBC: 6 10*3/uL (ref 4.0–10.5)

## 2016-02-03 LAB — I-STAT CG4 LACTIC ACID, ED: Lactic Acid, Venous: 1.46 mmol/L (ref 0.5–1.9)

## 2016-02-03 MED ORDER — SODIUM CHLORIDE 0.9 % IV BOLUS (SEPSIS)
1000.0000 mL | Freq: Once | INTRAVENOUS | Status: AC
  Administered 2016-02-03: 1000 mL via INTRAVENOUS

## 2016-02-03 NOTE — ED Notes (Signed)
Tried to obtain temp. On pt, oral and axillary on two different monitors. Nurse was notified.

## 2016-02-03 NOTE — ED Provider Notes (Signed)
[facility] DEPT Provider Note   CSN: [id number] Arrival date & time: 02/03/16  1735    History   Chief Complaint Chief Complaint  Patient presents with  . Weakness    LEVEL 5 CAVEAT 2/2 AMS; INTERMITTENT CONFUSION  HPI Paula Becker is a 41 y.o. female.  41 y/o female with hx of HIV (known hx of medication noncompliance with last CD4 count of 20), ESRD on dialysis (last dialyzed today), necrotizing PNA, anemia of chronic disease, and MRSA presents to the emergency department for further evaluation of generalized weakness. She was seen yesterday for similar symptoms and discharged with a course of Levaquin. Patient has had diarrhea over the past few days. She does endorse having diarrhea today, though only reports having one episode. This has been nonbloody and patient denies associated abdominal pain. She further denies chest pain and shortness of breath. Son reports that patient has seemed intermittently confused over the past 3 days. This is new for her. She was dialyzed today and, son reports, that he was notified that the patient was "unresponsive" during dialysis. The patient does not recall being dialyzed today. Son denies any fevers recently. Patient is hypothermic in the emergency department. History limited by intermittent confusion/altered mental status and acuity of condition.  PCP - Dr. Johnnye Sima     Past Medical History:  Diagnosis Date  . Anemia of chronic disease   . ESRD on dialysis ([place])   . Herpes   . History of noncompliance with medical treatment   . HIV (human immunodeficiency virus infection) ([place])   . MRSA (methicillin resistant staph aureus) culture positive   . Necrotizing pneumonia ([place]) 07/2010  . Pneumonia 06/23/11   RLL patchy, nodular lung disease  . Renal disorder     Patient Active Problem List   Diagnosis Date Noted  . Acute encephalopathy 02/04/2016  . HIV disease ([place])   . HCAP (healthcare-associated pneumonia)   . ESRD on dialysis ([place])    . 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 Pressure ulcer 03/10/2015  . Secondary hyperparathyroidism of renal origin ([place]) 03/10/2015  . Diarrhea 02/06/2015  . 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 Noncompliance with medication treatment due to difficulty with route of medication administration 03/15/2011  . Folliculitis [id number]  . HSV (herpes simplex virus) anogenital infection 08/19/2010  . Anemia of chronic disease 05/31/2010  . Severe protein-calorie malnutrition ([place]) 05/31/2010  . Unspecified episodic mood disorder 05/31/2010    Past Surgical History:  Procedure Laterality Date  . BASCILIC VEIN TRANSPOSITION Left 03/16/2015   Procedure: BASCILIC VEIN TRANSPOSITION LEFT UPPER ARM;  Surgeon: Angelia Mould, MD;  Location: [facility];  Service: Vascular;  Laterality: Left;  . BREAST SURGERY  03/2010   right; "don't know what they did"  . CESAREAN SECTION  1998  . INSERTION OF DIALYSIS CATHETER N/A 03/16/2015   Procedure: INSERTION OF DIALYSIS CATHETER RIGHT INTERNAL JUGULAR;  Surgeon: Angelia Mould, MD;  Location: [facility];  Service: Vascular;  Laterality: N/A;  . VIDEO BRONCHOSCOPY  06/28/2011   Procedure: VIDEO BRONCHOSCOPY WITH FLUORO;  Surgeon: Chesley Mires, MD;  Location: [facility];  Service: Cardiopulmonary;  Laterality: Bilateral;    OB History    No data available       Home Medications    Prior to Admission medications   Medication Sig Start Date End Date Taking? Authorizing Provider  acyclovir ointment (ZOVIRAX) 5 % Apply 1 application topically every 4 (four) hours as needed. 02/02/16   [place], PA-C  albuterol (PROVENTIL HFA;VENTOLIN HFA) 108 (90 Base) MCG/ACT inhaler Inhale 2 puffs into the lungs every 6 (six) hours as needed for wheezing or shortness of breath. 10/03/15   Ripudeep Krystal Eaton, MD  alum & mag hydroxide-simeth (MAALOX/MYLANTA) 200-200-20 MG/5ML suspension Take 30 mLs by mouth every 6 (six) hours as needed for indigestion,  heartburn or flatulence. 02/11/15   Burgess Estelle, MD  benzonatate (TESSALON PERLES) 100 MG capsule Take 1 capsule (100 mg total) by mouth 3 (three) times daily as needed for cough. For cough Patient not taking: Reported on 02/02/2016 10/03/15   Ripudeep Krystal Eaton, MD  ceFAZolin (ANCEF) 2-4 GM/100ML-% IVPB Inject 100 mLs (2 g total) into the vein Every Tuesday,Thursday,and Saturday with dialysis. X 2 doses Patient not taking: Reported on 02/02/2016 10/05/15   Ripudeep Krystal Eaton, MD  Darbepoetin Alfa (ARANESP) 100 MCG/0.5ML SOSY injection Inject 0.5 mLs (100 mcg total) into the vein every Thursday with hemodialysis. 03/17/15   Iline Oven, MD  dolutegravir (TIVICAY) 50 MG tablet Take 1 tablet (50 mg total) by mouth 2 (two) times daily. 08/09/15   Campbell Riches, MD  doxercalciferol (HECTOROL) 4 MCG/2ML injection Inject 1 mL (2 mcg total) into the vein Every Tuesday,Thursday,and Saturday with dialysis. 03/17/15   Iline Oven, MD  feeding supplement (BOOST / RESOURCE BREEZE) LIQD Take 1 Container by mouth 3 (three) times daily between meals. 03/17/15   Iline Oven, MD  gabapentin (NEURONTIN) 300 MG capsule TAKE 1 CAPSULE BY MOUTH THREE TIMES DAILY Patient not taking: Reported on 02/02/2016 12/28/14   Campbell Riches, MD  guaiFENesin (MUCINEX) 600 MG 12 hr tablet Take 1 tablet (600 mg total) by mouth 2 (two) times daily. 10/03/15   Ripudeep Krystal Eaton, MD  HYDROcodone-acetaminophen (NORCO/VICODIN) 5-325 MG tablet Take 1 tablet by mouth every 4 (four) hours as needed. 12/27/15   Virgel Manifold, MD  hydrOXYzine (ATARAX/VISTARIL) 25 MG tablet Take 1 tablet (25 mg total) by mouth every 6 (six) hours as needed for itching. 10/03/15   Ripudeep Krystal Eaton, MD  ibuprofen (ADVIL,MOTRIN) 200 MG tablet Take 200 mg by mouth every 6 (six) hours as needed for moderate pain.    Historical Provider, MD  levofloxacin (LEVAQUIN) 500 MG tablet Take 1 tablet (500 mg total) by mouth every other day. 02/02/16   Jaime Pilcher Ward, PA-C    lopinavir-ritonavir Vevelyn Francois) 400-100 MG/5ML solution Take 10 mLs (800 mg total) by mouth daily with breakfast. 08/09/15   Campbell Riches, MD  multivitamin (RENA-VIT) TABS tablet Take 1 tablet by mouth at bedtime. Patient not taking: Reported on 09/29/2015 08/09/15   Campbell Riches, MD  sulfamethoxazole-trimethoprim (BACTRIM) 400-80 MG/5ML injection Inject 10 mLs (160 mg of trimethoprim total) into the vein every Monday, Wednesday, and Friday. Patient not taking: Reported on 08/09/2015 06/09/15   Campbell Riches, MD  valACYclovir (VALTREX) 500 MG tablet Take 1 tablet (500 mg total) by mouth daily. Patient not taking: Reported on 09/29/2015 08/09/15   Campbell Riches, MD  VIREAD 300 MG tablet TAKE 1 TABLET BY MOUTH ONCE A WEEK 09/03/15   Campbell Riches, MD  zidovudine (RETROVIR) 100 MG capsule TAKE 3 CAPSULES(300 MG) BY MOUTH DAILY WITH BREAKFAST 10/08/15   Campbell Riches, MD    Family History Family History  Problem Relation Age of Onset  . Lung disease Mother     passed away at 39 with lung disease  . Hypertension Father     Social History Social History  Substance Use Topics  . Smoking status: Never Smoker  . Smokeless tobacco: Never Used  . Alcohol use No     Allergies   Shellfish-derived products and Vancomycin   Review of Systems Review of Systems Ten systems reviewed and are negative for acute change, except as noted in the HPI.    Physical Exam Updated Vital Signs BP 108/90   Pulse 83   Temp (!) 91.2 F (32.9 C) (Rectal)   Resp (!) 38   LMP 01/16/2015   SpO2 94%   Physical Exam  Constitutional: She is oriented to person, place, and time. She appears well-developed and well-nourished. No distress.  Cachectic in appearance; chronically ill.  HENT:  Head: Normocephalic and atraumatic.  Eyes: Conjunctivae and EOM are normal. No scleral icterus.  Neck: Normal range of motion.  Cardiovascular: Normal rate, regular rhythm and intact distal pulses.    Pulmonary/Chest: Effort normal. No respiratory distress. She has no wheezes. She has no rales.  Lungs grossly clear bilaterally  Abdominal: Soft. She exhibits no distension and no mass. There is no tenderness.  Soft, nontender abdomen.  Musculoskeletal: Normal range of motion.  Neurological: She is alert and oriented to person, place, and time. No cranial nerve deficit. She exhibits normal muscle tone. Coordination normal.  GCS 15. A&Ox4. Speech is goal oriented. No focal deficits noted on exam.   Skin: Skin is dry. No rash noted. She is not diaphoretic. No erythema. No pallor.  Cool to the touch.  Psychiatric: She has a normal mood and affect. She is slowed. She exhibits abnormal recent memory.  Intermittent confusion. Patient denies attending dialysis today, but son (at bedside) states that she went. Also reports taking Levaquin prescribed to her yesterday. Son denies getting this Rx filled.  Nursing note and vitals reviewed.    ED Treatments / Results  Labs (all labs ordered are listed, but only abnormal results are displayed) Labs Reviewed  CBC WITH DIFFERENTIAL/PLATELET - Abnormal; Notable for the following:       Result Value   RBC 3.32 (*)    Hemoglobin 8.9 (*)    HCT 28.2 (*)    RDW 17.6 (*)    Platelets 51 (*)    Lymphs Abs 0.5 (*)    All other components within normal limits  COMPREHENSIVE METABOLIC PANEL - Abnormal; Notable for the following:    Chloride 98 (*)    BUN 26 (*)    Creatinine, Ser 4.07 (*)    Calcium 8.6 (*)    Total Protein 8.4 (*)    Albumin 2.5 (*)    AST 50 (*)    Alkaline Phosphatase 277 (*)    Total Bilirubin 0.1 (*)    GFR calc non Af Amer 13 (*)    GFR calc Af Amer 15 (*)    All other components within normal limits  APTT - Abnormal; Notable for the following:    aPTT 40 (*)    All other components within normal limits  GASTROINTESTINAL PANEL BY PCR, STOOL (REPLACES STOOL CULTURE)  C DIFFICILE QUICK SCREEN W PCR REFLEX  CULTURE, BLOOD  (ROUTINE X 2)  CULTURE, BLOOD (ROUTINE X 2)  PROTIME-INR  RAPID URINE DRUG SCREEN, HOSP PERFORMED  BLOOD GAS, ARTERIAL  I-STAT CG4 LACTIC ACID, ED  I-STAT CG4 LACTIC ACID, ED    EKG  EKG Interpretation None       Radiology Dg Chest 2 View  Result Date: 02/03/2016 CLINICAL DATA:  Sepsis EXAM: CHEST  2 VIEW COMPARISON:  02/02/2016, 09/29/2015 FINDINGS: Hyperinflation is present. Bibasilar bronchiectasis and bronchial thickening as before. No change in bibasilar infiltrates. Probable tiny effusions. Stable mild cardiomegaly without overt failure. No pneumothorax. Surgical clips in the left breast. IMPRESSION: No significant interval change in bibasilar bronchiectasis and bibasilar infiltrates with probable tiny effusions. Electronically Signed   By: Donavan Foil M.D.   On: 02/03/2016 23:02   Dg Chest 2 View  Result Date: 02/02/2016 CLINICAL DATA:  Productive cough and nausea for 1 day, history end-stage renal disease, HIV, pneumonia EXAM: CHEST  2 VIEW COMPARISON:  09/29/2015 FINDINGS: Normal heart size, mediastinal contours, and pulmonary vascularity. Atherosclerotic calcification aorta. Hyperinflated lungs with bibasilar bronchiectasis. Bibasilar infiltrates versus atelectasis, increased on RIGHT. Tiny RIGHT pleural effusion, new. Mild diffuse interstitial prominence unchanged. No pneumothorax. Bones unremarkable. IMPRESSION: Emphysematous changes with bibasilar bronchiectasis, bibasilar infiltrates versus atelectasis increased on RIGHT, and tiny RIGHT pleural effusion. Electronically Signed   By: Lavonia Dana M.D.   On: 02/02/2016 13:21   Ct Head W Or Wo Contrast  Result Date: 02/04/2016 CLINICAL DATA:  Generalized weakness and diarrhea at dialysis today. Followup encephalopathy. AIDS. EXAM: CT HEAD WITHOUT AND WITH CONTRAST TECHNIQUE: Contiguous axial images were obtained from the base of the skull through the vertex without and with intravenous contrast CONTRAST:  75 cc ISOVUE-300 IOPAMIDOL  (ISOVUE-300) INJECTION 61% COMPARISON:  CT HEAD April 05, 2010 FINDINGS: BRAIN: The ventricles and sulci are normal. No intraparenchymal hemorrhage, mass effect nor midline shift. No acute large vascular territory infarcts. Punctate RIGHT frontal parenchymal calcification. No abnormal extra-axial fluid collections. Basal cisterns are patent. VASCULAR: Mild calcific atherosclerosis of the carotid siphons. SKULL/SOFT TISSUES: No skull fracture. No significant soft tissue swelling. ORBITS/SINUSES: The included ocular globes and orbital contents are normal.Mild paranasal sinusitis. Mastoid air cells are well aerated. Soft tissue within the external auditory canals compatible with cerumen. OTHER: None. IMPRESSION: No acute intracranial process. RIGHT frontal lobe punctate calcification most compatible with prior infection. Chronic sinusitis. Mild atherosclerosis. Electronically Signed   By: Elon Alas M.D.   On: 02/04/2016 01:05    Procedures Procedures (including critical care time)  Medications Ordered in ED Medications  vancomycin (VANCOCIN) IVPB 1000 mg/200 mL premix (1,000 mg Intravenous New Bag/Given 02/04/16 0243)  ceFEPIme (MAXIPIME) 1 g in dextrose 5 % 50 mL IVPB (not administered)  diphenhydrAMINE (BENADRYL) capsule 25 mg (not administered)  acyclovir (ZOVIRAX) 250 mg in dextrose 5 % 100 mL IVPB (not administered)  ampicillin (OMNIPEN) 2 g in sodium chloride 0.9 % 50 mL IVPB (not administered)  diphenhydrAMINE (BENADRYL) injection 12.5 mg (not administered)  sodium chloride 0.9 % bolus 1,000 mL (0 mLs Intravenous Stopped 02/04/16 0105)  iopamidol (ISOVUE-300) 61 % injection (75 mLs  Contrast Given 02/04/16 0024)    CRITICAL CARE Performed by: Antonietta Breach   Total critical care time: 45 minutes  Critical care time was exclusive of separately billable procedures and treating other patients.  Critical care was necessary to treat or prevent imminent or life-threatening  deterioration.  Critical care was time spent personally by me on the following activities: development of treatment plan with patient and/or surrogate as well as nursing, discussions with consultants, evaluation of patient's response to treatment, examination of patient, obtaining history from patient or surrogate, ordering and performing treatments and interventions, ordering and review of laboratory studies, ordering and review of radiographic studies, pulse oximetry and re-evaluation of patient's condition.   Initial Impression / Assessment and Plan / ED Course  I have reviewed the triage  vital signs and the nursing notes.  Pertinent labs & imaging results that were available during my care of the patient were reviewed by me and considered in my medical decision making (see chart for details).  Clinical Course     41 year old female presents to the emergency department for complaints of weakness; seen for same yesterday. Patient with episodes of acute confusion, worsening x 3 days, especially as related to recent events. She is alert and oriented to person, place, and time during my assessment. No focal deficits appreciated on exam. 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 She is also on hemodialysis for chronic kidney disease. The patient was dialyzed today, per son. She has reportedly been having multiple episodes of diarrhea, though she has not had profuse diarrhea since arrival. GI pathogen panel ordered.  Patient was found to be hypothermic on initial presentation. It is possible that this is contributing to the patient's encephalopathy. CXR stable compared to August 2017. Given her AIDS history, a CT head was obtained to evaluate for abscesses or other lesions. CT head is negative for acute findings. Unable to fully rule out meningitis, though suspicion for this is lower given lack of meningismus, abnormal lactate, and presence of leukocytosis. Patient  does not fully meet sepsis criteria, but was started on empiric antibiotics. LP withheld in ED given thrombocytopenia of 51k. Blood cultures pending.  Hypothermia has mildly improved from 91.17F to 92.43F with bear hugger and infusion of warmed fluids. Plan to admit to [facility] for further management and monitoring. Case discussed with Dr. Hal Hope who will admit.   Final Clinical Impressions(s) / ED Diagnoses   Final diagnoses:  Hypothermia, initial encounter  Acute encephalopathy    New Prescriptions New Prescriptions   No medications on file     Antonietta Breach, PA-C 02/04/16 [place], PA-C 02/04/16 Grace Liu, MD 02/04/16 1319

## 2016-02-03 NOTE — Patient Outreach (Signed)
[facility]) Care Management  02/03/2016  Paula Becker [birth year] [id number]   Telephone call to patient for ED referral screening.  Patient number disconnected.  Called number for father HIPAA compliant voice message left.    Plan: RN Health Coach will attempt patient again in within 10 business days.    Jone Baseman, RN, MSN [place] [phone]

## 2016-02-03 NOTE — ED Notes (Signed)
Enteric precautions placed on pt's door

## 2016-02-03 NOTE — ED Notes (Signed)
X-ray notified about pt being ready for x-ray

## 2016-02-03 NOTE — ED Triage Notes (Signed)
Pt here from dialysis today with generalized weakness and diarrhea; pt seen yesterday for same and is non compliant with her HIV meds

## 2016-02-04 ENCOUNTER — Observation Stay (HOSPITAL_COMMUNITY): Payer: Medicare Other

## 2016-02-04 ENCOUNTER — Emergency Department (HOSPITAL_COMMUNITY): Payer: Medicare Other

## 2016-02-04 ENCOUNTER — Encounter (HOSPITAL_COMMUNITY): Payer: Self-pay | Admitting: Radiology

## 2016-02-04 DIAGNOSIS — R680 Hypothermia, not associated with low environmental temperature: Secondary | ICD-10-CM | POA: Diagnosis present

## 2016-02-04 DIAGNOSIS — N186 End stage renal disease: Secondary | ICD-10-CM

## 2016-02-04 DIAGNOSIS — B0089 Other herpesviral infection: Secondary | ICD-10-CM | POA: Diagnosis present

## 2016-02-04 DIAGNOSIS — N2581 Secondary hyperparathyroidism of renal origin: Secondary | ICD-10-CM | POA: Diagnosis present

## 2016-02-04 DIAGNOSIS — Z836 Family history of other diseases of the respiratory system: Secondary | ICD-10-CM

## 2016-02-04 DIAGNOSIS — D696 Thrombocytopenia, unspecified: Secondary | ICD-10-CM | POA: Diagnosis present

## 2016-02-04 DIAGNOSIS — G9340 Encephalopathy, unspecified: Secondary | ICD-10-CM | POA: Diagnosis not present

## 2016-02-04 DIAGNOSIS — Z515 Encounter for palliative care: Secondary | ICD-10-CM | POA: Diagnosis present

## 2016-02-04 DIAGNOSIS — E43 Unspecified severe protein-calorie malnutrition: Secondary | ICD-10-CM | POA: Diagnosis present

## 2016-02-04 DIAGNOSIS — D631 Anemia in chronic kidney disease: Secondary | ICD-10-CM | POA: Diagnosis present

## 2016-02-04 DIAGNOSIS — A0472 Enterocolitis due to Clostridium difficile, not specified as recurrent: Secondary | ICD-10-CM | POA: Diagnosis present

## 2016-02-04 DIAGNOSIS — E871 Hypo-osmolality and hyponatremia: Secondary | ICD-10-CM | POA: Diagnosis present

## 2016-02-04 DIAGNOSIS — Z881 Allergy status to other antibiotic agents status: Secondary | ICD-10-CM | POA: Diagnosis not present

## 2016-02-04 DIAGNOSIS — R9089 Other abnormal findings on diagnostic imaging of central nervous system: Secondary | ICD-10-CM | POA: Insufficient documentation

## 2016-02-04 DIAGNOSIS — Z7189 Other specified counseling: Secondary | ICD-10-CM | POA: Diagnosis not present

## 2016-02-04 DIAGNOSIS — B20 Human immunodeficiency virus [HIV] disease: Secondary | ICD-10-CM | POA: Diagnosis present

## 2016-02-04 DIAGNOSIS — Z6822 Body mass index (BMI) 22.0-22.9, adult: Secondary | ICD-10-CM | POA: Diagnosis not present

## 2016-02-04 DIAGNOSIS — T68XXXA Hypothermia, initial encounter: Secondary | ICD-10-CM | POA: Diagnosis not present

## 2016-02-04 DIAGNOSIS — L98411 Non-pressure chronic ulcer of buttock limited to breakdown of skin: Secondary | ICD-10-CM | POA: Diagnosis present

## 2016-02-04 DIAGNOSIS — Z91013 Allergy to seafood: Secondary | ICD-10-CM

## 2016-02-04 DIAGNOSIS — Z992 Dependence on renal dialysis: Secondary | ICD-10-CM | POA: Diagnosis not present

## 2016-02-04 DIAGNOSIS — J470 Bronchiectasis with acute lower respiratory infection: Secondary | ICD-10-CM | POA: Diagnosis present

## 2016-02-04 DIAGNOSIS — J984 Other disorders of lung: Secondary | ICD-10-CM | POA: Diagnosis present

## 2016-02-04 DIAGNOSIS — J189 Pneumonia, unspecified organism: Secondary | ICD-10-CM | POA: Diagnosis present

## 2016-02-04 DIAGNOSIS — I120 Hypertensive chronic kidney disease with stage 5 chronic kidney disease or end stage renal disease: Secondary | ICD-10-CM | POA: Diagnosis present

## 2016-02-04 DIAGNOSIS — I6782 Cerebral ischemia: Secondary | ICD-10-CM | POA: Diagnosis present

## 2016-02-04 DIAGNOSIS — Z8249 Family history of ischemic heart disease and other diseases of the circulatory system: Secondary | ICD-10-CM

## 2016-02-04 DIAGNOSIS — R197 Diarrhea, unspecified: Secondary | ICD-10-CM | POA: Diagnosis not present

## 2016-02-04 DIAGNOSIS — A419 Sepsis, unspecified organism: Secondary | ICD-10-CM | POA: Diagnosis present

## 2016-02-04 DIAGNOSIS — Z66 Do not resuscitate: Secondary | ICD-10-CM | POA: Diagnosis present

## 2016-02-04 DIAGNOSIS — I776 Arteritis, unspecified: Secondary | ICD-10-CM | POA: Diagnosis present

## 2016-02-04 DIAGNOSIS — Y95 Nosocomial condition: Secondary | ICD-10-CM | POA: Diagnosis present

## 2016-02-04 DIAGNOSIS — G9349 Other encephalopathy: Secondary | ICD-10-CM | POA: Diagnosis present

## 2016-02-04 DIAGNOSIS — C8589 Other specified types of non-Hodgkin lymphoma, extranodal and solid organ sites: Secondary | ICD-10-CM | POA: Diagnosis present

## 2016-02-04 DIAGNOSIS — R0602 Shortness of breath: Secondary | ICD-10-CM | POA: Diagnosis not present

## 2016-02-04 DIAGNOSIS — R6520 Severe sepsis without septic shock: Secondary | ICD-10-CM | POA: Diagnosis not present

## 2016-02-04 DIAGNOSIS — R51 Headache: Secondary | ICD-10-CM | POA: Diagnosis not present

## 2016-02-04 LAB — COMPREHENSIVE METABOLIC PANEL
ALBUMIN: 1.9 g/dL — AB (ref 3.5–5.0)
ALK PHOS: 249 U/L — AB (ref 38–126)
ALT: 47 U/L (ref 14–54)
AST: 46 U/L — AB (ref 15–41)
Anion gap: 11 (ref 5–15)
BILIRUBIN TOTAL: 0.4 mg/dL (ref 0.3–1.2)
BUN: 27 mg/dL — AB (ref 6–20)
CO2: 25 mmol/L (ref 22–32)
Calcium: 8 mg/dL — ABNORMAL LOW (ref 8.9–10.3)
Chloride: 102 mmol/L (ref 101–111)
Creatinine, Ser: 4.16 mg/dL — ABNORMAL HIGH (ref 0.44–1.00)
GFR calc Af Amer: 14 mL/min — ABNORMAL LOW (ref >60)
GFR calc non Af Amer: 12 mL/min — ABNORMAL LOW (ref >60)
Glucose, Bld: 63 mg/dL — ABNORMAL LOW (ref 65–99)
POTASSIUM: 4.2 mmol/L (ref 3.5–5.1)
Sodium: 138 mmol/L (ref 135–145)
TOTAL PROTEIN: 6.8 g/dL (ref 6.5–8.1)

## 2016-02-04 LAB — CBG MONITORING, ED
GLUCOSE-CAPILLARY: 68 mg/dL (ref 65–99)
Glucose-Capillary: 71 mg/dL (ref 65–99)

## 2016-02-04 LAB — CBC WITH DIFFERENTIAL/PLATELET
BASOS ABS: 0 10*3/uL (ref 0.0–0.1)
Basophils Relative: 0 %
EOS PCT: 9 %
Eosinophils Absolute: 0.3 10*3/uL (ref 0.0–0.7)
HCT: 25.9 % — ABNORMAL LOW (ref 36.0–46.0)
Hemoglobin: 7.9 g/dL — ABNORMAL LOW (ref 12.0–15.0)
LYMPHS PCT: 11 %
Lymphs Abs: 0.4 10*3/uL — ABNORMAL LOW (ref 0.7–4.0)
MCH: 25.8 pg — AB (ref 26.0–34.0)
MCHC: 30.5 g/dL (ref 30.0–36.0)
MCV: 84.6 fL (ref 78.0–100.0)
MONO ABS: 0.3 10*3/uL (ref 0.1–1.0)
Monocytes Relative: 7 %
Neutro Abs: 2.8 10*3/uL (ref 1.7–7.7)
Neutrophils Relative %: 73 %
PLATELETS: 47 10*3/uL — AB (ref 150–400)
RBC: 3.06 MIL/uL — ABNORMAL LOW (ref 3.87–5.11)
RDW: 17.7 % — AB (ref 11.5–15.5)
WBC: 3.8 10*3/uL — ABNORMAL LOW (ref 4.0–10.5)

## 2016-02-04 LAB — TYPE AND SCREEN
ABO/RH(D): B POS
Antibody Screen: NEGATIVE

## 2016-02-04 LAB — I-STAT ARTERIAL BLOOD GAS, ED
Acid-Base Excess: 3 mmol/L — ABNORMAL HIGH (ref 0.0–2.0)
BICARBONATE: 27.7 mmol/L (ref 20.0–28.0)
O2 Saturation: 97 %
PCO2 ART: 38.5 mmHg (ref 32.0–48.0)
PO2 ART: 80 mmHg — AB (ref 83.0–108.0)
Patient temperature: 93.5
TCO2: 29 mmol/L (ref 0–100)
pH, Arterial: 7.453 — ABNORMAL HIGH (ref 7.350–7.450)

## 2016-02-04 LAB — POCT I-STAT 3, ART BLOOD GAS (G3+)
Acid-Base Excess: 7 mmol/L — ABNORMAL HIGH (ref 0.0–2.0)
Bicarbonate: 30.7 mmol/L — ABNORMAL HIGH (ref 20.0–28.0)
O2 Saturation: 93 %
Patient temperature: 98.6
TCO2: 32 mmol/L (ref 0–100)
pCO2 arterial: 38.2 mmHg (ref 32.0–48.0)
pH, Arterial: 7.513 — ABNORMAL HIGH (ref 7.350–7.450)
pO2, Arterial: 61 mmHg — ABNORMAL LOW (ref 83.0–108.0)

## 2016-02-04 LAB — INFLUENZA PANEL BY PCR (TYPE A & B)
INFLBPCR: NEGATIVE
Influenza A By PCR: NEGATIVE

## 2016-02-04 LAB — LACTATE DEHYDROGENASE: LDH: 132 U/L (ref 98–192)

## 2016-02-04 LAB — CRYPTOCOCCAL ANTIGEN: Crypto Ag: NEGATIVE

## 2016-02-04 LAB — CBC
HCT: 27.8 % — ABNORMAL LOW (ref 36.0–46.0)
Hemoglobin: 8.8 g/dL — ABNORMAL LOW (ref 12.0–15.0)
MCH: 26.8 pg (ref 26.0–34.0)
MCHC: 31.7 g/dL (ref 30.0–36.0)
MCV: 84.8 fL (ref 78.0–100.0)
PLATELETS: 36 10*3/uL — AB (ref 150–400)
RBC: 3.28 MIL/uL — ABNORMAL LOW (ref 3.87–5.11)
RDW: 18.1 % — AB (ref 11.5–15.5)
WBC: 8.7 10*3/uL (ref 4.0–10.5)

## 2016-02-04 LAB — CRYPTOCOCCAL ANTIGEN, CSF: Crypto Ag: NEGATIVE

## 2016-02-04 LAB — CSF CELL COUNT WITH DIFFERENTIAL
RBC Count, CSF: 7 /mm3 — ABNORMAL HIGH
TUBE #: 1
WBC, CSF: 8 /mm3 — ABNORMAL HIGH (ref 0–5)

## 2016-02-04 LAB — CORTISOL: Cortisol, Plasma: 13.9 ug/dL

## 2016-02-04 LAB — DIRECT ANTIGLOBULIN TEST (NOT AT ARMC)
DAT, IgG: NEGATIVE
DAT, complement: NEGATIVE

## 2016-02-04 LAB — I-STAT CG4 LACTIC ACID, ED: Lactic Acid, Venous: 1.03 mmol/L (ref 0.5–1.9)

## 2016-02-04 LAB — AMMONIA: Ammonia: 28 umol/L (ref 9–35)

## 2016-02-04 LAB — TSH: TSH: 5.379 u[IU]/mL — ABNORMAL HIGH (ref 0.350–4.500)

## 2016-02-04 LAB — PROTIME-INR
INR: 1.1
Prothrombin Time: 14.3 seconds (ref 11.4–15.2)

## 2016-02-04 LAB — T-HELPER CELLS (CD4) COUNT (NOT AT ARMC)
CD4 T CELL HELPER: 5 % — AB (ref 33–55)
CD4 T Cell Abs: 20 /uL — ABNORMAL LOW (ref 400–2700)

## 2016-02-04 LAB — FIBRINOGEN: Fibrinogen: 649 mg/dL — ABNORMAL HIGH (ref 210–475)

## 2016-02-04 LAB — TECHNOLOGIST SMEAR REVIEW

## 2016-02-04 LAB — VITAMIN B12: VITAMIN B 12: 437 pg/mL (ref 180–914)

## 2016-02-04 LAB — PROCALCITONIN: Procalcitonin: 0.61 ng/mL

## 2016-02-04 LAB — GLUCOSE, CSF: GLUCOSE CSF: 38 mg/dL — AB (ref 40–70)

## 2016-02-04 LAB — APTT: aPTT: 40 seconds — ABNORMAL HIGH (ref 24–36)

## 2016-02-04 LAB — PROTEIN, CSF: TOTAL PROTEIN, CSF: 57 mg/dL — AB (ref 15–45)

## 2016-02-04 MED ORDER — DEXTROSE 5 % IV SOLN
1.0000 g | INTRAVENOUS | Status: DC
  Administered 2016-02-04 – 2016-02-06 (×3): 1 g via INTRAVENOUS
  Filled 2016-02-04 (×4): qty 1

## 2016-02-04 MED ORDER — DOXERCALCIFEROL 4 MCG/2ML IV SOLN
INTRAVENOUS | Status: AC
  Filled 2016-02-04: qty 4

## 2016-02-04 MED ORDER — VANCOMYCIN HCL IN DEXTROSE 1-5 GM/200ML-% IV SOLN
1000.0000 mg | Freq: Once | INTRAVENOUS | Status: AC
  Administered 2016-02-04: 1000 mg via INTRAVENOUS
  Filled 2016-02-04: qty 200

## 2016-02-04 MED ORDER — OSELTAMIVIR PHOSPHATE 30 MG PO CAPS
30.0000 mg | ORAL_CAPSULE | Freq: Every day | ORAL | Status: DC
  Filled 2016-02-04: qty 1

## 2016-02-04 MED ORDER — SODIUM CHLORIDE 0.9 % IV SOLN
Freq: Once | INTRAVENOUS | Status: AC
  Administered 2016-02-05: 12:00:00 via INTRAVENOUS

## 2016-02-04 MED ORDER — DIPHENHYDRAMINE HCL 25 MG PO CAPS
25.0000 mg | ORAL_CAPSULE | Freq: Once | ORAL | Status: DC
  Filled 2016-02-04: qty 1

## 2016-02-04 MED ORDER — VANCOMYCIN HCL IN DEXTROSE 500-5 MG/100ML-% IV SOLN
INTRAVENOUS | Status: AC
  Administered 2016-02-04: 500 mg
  Filled 2016-02-04: qty 100

## 2016-02-04 MED ORDER — DEXTROSE 5 % IV SOLN
1.0000 g | INTRAVENOUS | Status: DC
  Filled 2016-02-04: qty 1

## 2016-02-04 MED ORDER — IOPAMIDOL (ISOVUE-300) INJECTION 61%
INTRAVENOUS | Status: AC
  Administered 2016-02-04: 75 mL
  Filled 2016-02-04: qty 75

## 2016-02-04 MED ORDER — LIDOCAINE HCL (PF) 1 % IJ SOLN
2.0000 mL | Freq: Once | INTRAMUSCULAR | Status: AC
  Administered 2016-02-04: 2 mL via INTRADERMAL

## 2016-02-04 MED ORDER — ACETAMINOPHEN 325 MG PO TABS
650.0000 mg | ORAL_TABLET | Freq: Four times a day (QID) | ORAL | Status: DC | PRN
  Administered 2016-02-07 – 2016-02-08 (×2): 650 mg via ORAL
  Filled 2016-02-04 (×2): qty 2

## 2016-02-04 MED ORDER — SULFAMETHOXAZOLE-TRIMETHOPRIM 400-80 MG/5ML IV SOLN
480.0000 mg | INTRAVENOUS | Status: DC
  Filled 2016-02-04: qty 30

## 2016-02-04 MED ORDER — ONDANSETRON HCL 4 MG/2ML IJ SOLN
4.0000 mg | Freq: Four times a day (QID) | INTRAMUSCULAR | Status: DC | PRN
  Filled 2016-02-04: qty 2

## 2016-02-04 MED ORDER — DEXTROSE 5 % IV SOLN
5.0000 mg/kg | INTRAVENOUS | Status: DC
  Administered 2016-02-04: 250 mg via INTRAVENOUS
  Filled 2016-02-04: qty 5

## 2016-02-04 MED ORDER — ONDANSETRON HCL 4 MG PO TABS: 4.0000 mg | ORAL_TABLET | Freq: Four times a day (QID) | ORAL | Status: DC | PRN

## 2016-02-04 MED ORDER — DOXERCALCIFEROL 4 MCG/2ML IV SOLN
5.0000 ug | INTRAVENOUS | Status: DC
  Administered 2016-02-04 – 2016-02-07 (×2): 5 ug via INTRAVENOUS
  Filled 2016-02-04: qty 4

## 2016-02-04 MED ORDER — VANCOMYCIN HCL 500 MG IV SOLR
500.0000 mg | INTRAVENOUS | Status: DC
  Filled 2016-02-04 (×2): qty 500

## 2016-02-04 MED ORDER — DEXTROSE 5 % IV SOLN
2.0000 g | Freq: Two times a day (BID) | INTRAVENOUS | Status: DC
  Filled 2016-02-04: qty 2

## 2016-02-04 MED ORDER — AMPICILLIN SODIUM 2 G IJ SOLR
2.0000 g | Freq: Two times a day (BID) | INTRAMUSCULAR | Status: DC
  Administered 2016-02-04: 2 g via INTRAVENOUS
  Filled 2016-02-04 (×2): qty 2000

## 2016-02-04 MED ORDER — FLUTICASONE PROPIONATE 50 MCG/ACT NA SUSP
2.0000 | Freq: Every day | NASAL | Status: DC
  Administered 2016-02-06 – 2016-02-08 (×2): 2 via NASAL
  Filled 2016-02-04 (×3): qty 16

## 2016-02-04 MED ORDER — DOXERCALCIFEROL 4 MCG/2ML IV SOLN
INTRAVENOUS | Status: AC
  Administered 2016-02-04: 5 ug via INTRAVENOUS
  Filled 2016-02-04: qty 2

## 2016-02-04 MED ORDER — ALBUTEROL SULFATE (2.5 MG/3ML) 0.083% IN NEBU: 2.5000 mg | INHALATION_SOLUTION | Freq: Four times a day (QID) | RESPIRATORY_TRACT | Status: DC | PRN

## 2016-02-04 MED ORDER — OSELTAMIVIR PHOSPHATE 75 MG PO CAPS
75.0000 mg | ORAL_CAPSULE | Freq: Every day | ORAL | Status: DC
  Filled 2016-02-04: qty 1

## 2016-02-04 MED ORDER — ACETAMINOPHEN 650 MG RE SUPP: 650.0000 mg | Freq: Four times a day (QID) | RECTAL | Status: DC | PRN

## 2016-02-04 MED ORDER — DIPHENHYDRAMINE HCL 50 MG/ML IJ SOLN
12.5000 mg | Freq: Once | INTRAMUSCULAR | Status: AC
  Administered 2016-02-04: 12.5 mg via INTRAVENOUS
  Filled 2016-02-04: qty 1

## 2016-02-04 NOTE — Care Management Note (Signed)
Case Management Note  Patient Details  Name: Paula Becker MRN: [id number] Date of Birth: January 22, 1976  Subjective/Objective:                  From home. /40 y.o. female with history of multiresistant drug HIV noncompliant with medication, ESRD and hemodialysis, history of herpes genitalis, chronic anemia and thrombocytopenia was brought to the ER by patient's son. Patient's son found that patient has been getting increasingly confused with unresponsive episodes.   Action/Plan: Follow for disposition needs. /Admit to OBSERVATION (acute encephalopathy with hypothermia); anticipate discharge [place].   Expected Discharge Date:  02/05/16               Expected Discharge Plan:  [place]  In-House Referral:  NA  Discharge planning Services  CM Consult  Post Acute Care Choice:  NA Choice offered to:  NA  DME Arranged:  N/A DME Agency:  NA  HH Arranged:  NA HH Agency:  NA  Status of Service:  In process, will continue to follow  If discussed at Long Length of Stay Meetings, dates discussed:    Additional Comments: HD patient, please notify [facility] [phone]) when pt ready for discharge (receives HD at [facility] clinic); also followed by [facility].  Fuller Mandril, RN 02/04/2016, 9:49 AM

## 2016-02-04 NOTE — ED Notes (Signed)
Paged Dr. Hal Hope regarding order for lumbar puncture. Radiology stated that LP must be attempted in ED first, MD states that platelets are too low for attempt in ED. Also, stated that LP needed to be approved by radiologist after attempted in ED. MD aware.

## 2016-02-04 NOTE — ED Notes (Signed)
Bear hugger placed back on pt.

## 2016-02-04 NOTE — Consult Note (Signed)
[facility] Renal Consultation Note  Indication for Consultation:  Management of ESRD/hemodialysis; anemia, hypertension/volume and secondary hyperparathyroidism  HPI: Paula Becker is a 41 y.o. female admitted with Hypothermia and acute encephalopathy in the setting of advanced, untreated HIV infection. She has ESRD HD MWF ( [facility]) last on 02/03/16 sec to missing her last 2 HDs'= 12/31. And 02/02/16 HD ("trans[portation "issues per pt.) She was brought to the ER by her 10 yr old  son. Patient's son found that patient has been getting increasingly confused with unresponsive episodes. Her CXR shows bronchiectasis and bibasal infiltrates not changed from previous admit in September 2017 with  healthcare associated pneumonia. She is not able to give me any history remains confused, now alert and   aware at [facility] but thinks Today is wed / not sure of month or year.CT head was showing nothing acute    Past Medical History:  Diagnosis Date  . Anemia of chronic disease   . ESRD on dialysis ([place])   . Herpes   . History of noncompliance with medical treatment   . HIV (human immunodeficiency virus infection) ([place])   . MRSA (methicillin resistant staph aureus) culture positive   . Necrotizing pneumonia ([place]) 07/2010  . Pneumonia 06/23/11   RLL patchy, nodular lung disease  . Renal disorder     Past Surgical History:  Procedure Laterality Date  . BASCILIC VEIN TRANSPOSITION Left 03/16/2015   Procedure: BASCILIC VEIN TRANSPOSITION LEFT UPPER ARM;  Surgeon: Angelia Mould, MD;  Location: [place];  Service: Vascular;  Laterality: Left;  . BREAST SURGERY  03/2010   right; "don't know what they did"  . CESAREAN SECTION  1998  . INSERTION OF DIALYSIS CATHETER N/A 03/16/2015   Procedure: INSERTION OF DIALYSIS CATHETER RIGHT INTERNAL JUGULAR;  Surgeon: Angelia Mould, MD;  Location: [place];  Service: Vascular;  Laterality: N/A;  . VIDEO BRONCHOSCOPY  06/28/2011   Procedure:  VIDEO BRONCHOSCOPY WITH FLUORO;  Surgeon: Chesley Mires, MD;  Location: [place];  Service: Cardiopulmonary;  Laterality: Bilateral;      Family History  Problem Relation Age of Onset  . Lung disease Mother     passed away at 26 with lung disease  . Hypertension Father       reports that she has never smoked. She has never used smokeless tobacco. She reports that she does not drink alcohol or use drugs.   Allergies  Allergen Reactions  . Shellfish-Derived Products Swelling    Facial swelling  . Vancomycin Rash    Prior to Admission medications   Medication Sig Start Date End Date Taking? Authorizing Provider  albuterol (PROVENTIL HFA;VENTOLIN HFA) 108 (90 Base) MCG/ACT inhaler Inhale 2 puffs into the lungs every 6 (six) hours as needed for wheezing or shortness of breath. 10/03/15  Yes Ripudeep Krystal Eaton, MD  dolutegravir (TIVICAY) 50 MG tablet Take 1 tablet (50 mg total) by mouth 2 (two) times daily. 08/09/15  Yes Campbell Riches, MD  hydrOXYzine (ATARAX/VISTARIL) 25 MG tablet Take 1 tablet (25 mg total) by mouth every 6 (six) hours as needed for itching. 10/03/15  Yes Ripudeep Krystal Eaton, MD  lopinavir-ritonavir Vevelyn Francois) 400-100 MG/5ML solution Take 10 mLs (800 mg total) by mouth daily with breakfast. 08/09/15  Yes Campbell Riches, MD  VIREAD 300 MG tablet TAKE 1 TABLET BY MOUTH ONCE A WEEK 09/03/15  Yes Campbell Riches, MD  zidovudine (RETROVIR) 100 MG capsule TAKE 3 CAPSULES(300 MG) BY MOUTH DAILY  WITH BREAKFAST 10/08/15  Yes Campbell Riches, MD  acyclovir ointment (ZOVIRAX) 5 % Apply 1 application topically every 4 (four) hours as needed. 02/02/16   Ozella Almond Ward, PA-C  alum & mag hydroxide-simeth (MAALOX/MYLANTA) 200-200-20 MG/5ML suspension Take 30 mLs by mouth every 6 (six) hours as needed for indigestion, heartburn or flatulence. Patient not taking: Reported on 02/04/2016 02/11/15   Burgess Estelle, MD  Darbepoetin Alfa (ARANESP) 100 MCG/0.5ML SOSY injection Inject 0.5 mLs (100 mcg  total) into the vein every Thursday with hemodialysis. Patient not taking: Reported on 02/04/2016 03/17/15   Iline Oven, MD  doxercalciferol (HECTOROL) 4 MCG/2ML injection Inject 1 mL (2 mcg total) into the vein Every Tuesday,Thursday,and Saturday with dialysis. Patient taking differently: Inject 2 mcg into the vein every Monday, Wednesday, and Friday with hemodialysis.  03/17/15   Iline Oven, MD  feeding supplement (BOOST / RESOURCE BREEZE) LIQD Take 1 Container by mouth 3 (three) times daily between meals. Patient not taking: Reported on 02/04/2016 03/17/15   Iline Oven, MD  gabapentin (NEURONTIN) 300 MG capsule TAKE 1 CAPSULE BY MOUTH THREE TIMES DAILY Patient not taking: Reported on 02/02/2016 12/28/14   Campbell Riches, MD  HYDROcodone-acetaminophen (NORCO/VICODIN) 5-325 MG tablet Take 1 tablet by mouth every 4 (four) hours as needed. Patient not taking: Reported on 02/04/2016 12/27/15   Virgel Manifold, MD  multivitamin (RENA-VIT) TABS tablet Take 1 tablet by mouth at bedtime. Patient not taking: Reported on 09/29/2015 08/09/15   Campbell Riches, MD  valACYclovir (VALTREX) 500 MG tablet Take 1 tablet (500 mg total) by mouth daily. Patient not taking: Reported on 09/29/2015 08/09/15   Campbell Riches, MD    TDD:[id number] **OR** acetaminophen, albuterol, ondansetron **OR** ondansetron [facility]) IV  Results for orders placed or performed during the hospital encounter of 02/03/16 (from the past 48 hour(s))  I-Stat CG4 Lactic Acid, ED     Status: None   Collection Time: 02/03/16 10:01 PM  Result Value Ref Range   Lactic Acid, Venous 1.46 0.5 - 1.9 mmol/L  CBC with Differential     Status: Abnormal   Collection Time: 02/03/16 10:14 PM  Result Value Ref Range   WBC 6.0 4.0 - 10.5 K/uL   RBC 3.32 (L) 3.87 - 5.11 MIL/uL   Hemoglobin 8.9 (L) 12.0 - 15.0 g/dL   HCT 28.2 (L) 36.0 - 46.0 %   MCV 84.9 78.0 - 100.0 fL   MCH 26.8 26.0 - 34.0 pg   MCHC 31.6 30.0 - 36.0 g/dL   RDW  17.6 (H) 11.5 - 15.5 %   Platelets 51 (L) 150 - 400 K/uL    Comment: PLATELET COUNT CONFIRMED BY SMEAR LARGE PLATELETS PRESENT    Neutrophils Relative % 78 %   Neutro Abs 4.7 1.7 - 7.7 K/uL   Lymphocytes Relative 9 %   Lymphs Abs 0.5 (L) 0.7 - 4.0 K/uL   Monocytes Relative 7 %   Monocytes Absolute 0.4 0.1 - 1.0 K/uL   Eosinophils Relative 6 %   Eosinophils Absolute 0.4 0.0 - 0.7 K/uL   Basophils Relative 0 %   Basophils Absolute 0.0 0.0 - 0.1 K/uL  Comprehensive metabolic panel     Status: Abnormal   Collection Time: 02/03/16 10:14 PM  Result Value Ref Range   Sodium 140 135 - 145 mmol/L    Comment: DELTA CHECK NOTED   Potassium 4.1 3.5 - 5.1 mmol/L    Comment: DELTA CHECK NOTED   Chloride 98 (L)  101 - 111 mmol/L   CO2 29 22 - 32 mmol/L   Glucose, Bld 83 65 - 99 mg/dL   BUN 26 (H) 6 - 20 mg/dL   Creatinine, Ser 4.07 (H) 0.44 - 1.00 mg/dL    Comment: DELTA CHECK NOTED   Calcium 8.6 (L) 8.9 - 10.3 mg/dL   Total Protein 8.4 (H) 6.5 - 8.1 g/dL   Albumin 2.5 (L) 3.5 - 5.0 g/dL   AST 50 (H) 15 - 41 U/L   ALT 54 14 - 54 U/L   Alkaline Phosphatase 277 (H) 38 - 126 U/L   Total Bilirubin 0.1 (L) 0.3 - 1.2 mg/dL   GFR calc non Af Amer 13 (L) >60 mL/min   GFR calc Af Amer 15 (L) >60 mL/min    Comment: (NOTE) The eGFR has been calculated using the CKD EPI equation. This calculation has not been validated in all clinical situations. eGFR's persistently <60 mL/min signify possible Chronic Kidney Disease.    Anion gap 13 5 - 15  Protime-INR     Status: None   Collection Time: 02/04/16  1:15 AM  Result Value Ref Range   Prothrombin Time 14.3 11.4 - 15.2 seconds   INR 1.10   APTT     Status: Abnormal   Collection Time: 02/04/16  1:15 AM  Result Value Ref Range   aPTT 40 (H) 24 - 36 seconds    Comment:        IF BASELINE aPTT IS ELEVATED, SUGGEST PATIENT RISK ASSESSMENT BE USED TO DETERMINE APPROPRIATE ANTICOAGULANT THERAPY.   I-Stat CG4 Lactic Acid, ED     Status: None    Collection Time: 02/04/16  1:31 AM  Result Value Ref Range   Lactic Acid, Venous 1.03 0.5 - 1.9 mmol/L  I-Stat arterial blood gas, ED     Status: Abnormal   Collection Time: 02/04/16  3:05 AM  Result Value Ref Range   pH, Arterial 7.453 (H) 7.350 - 7.450   pCO2 arterial 38.5 32.0 - 48.0 mmHg   pO2, Arterial 80.0 (L) 83.0 - 108.0 mmHg   Bicarbonate 27.7 20.0 - 28.0 mmol/L   TCO2 29 0 - 100 mmol/L   O2 Saturation 97.0 %   Acid-Base Excess 3.0 (H) 0.0 - 2.0 mmol/L   Patient temperature 93.5 F    Collection site RADIAL, ALLEN'S TEST ACCEPTABLE    Drawn by Operator    Sample type ARTERIAL   T-helper cells (CD4) count (not at [facility])     Status: Abnormal   Collection Time: 02/04/16  5:26 AM  Result Value Ref Range   CD4 T Cell Abs 20 (L) 400 - 2,700 /uL   CD4 % Helper T Cell 5 (L) 33 - 55 %    Comment: Performed at [facility]  Comprehensive metabolic panel     Status: Abnormal   Collection Time: 02/04/16  5:26 AM  Result Value Ref Range   Sodium 138 135 - 145 mmol/L   Potassium 4.2 3.5 - 5.1 mmol/L   Chloride 102 101 - 111 mmol/L   CO2 25 22 - 32 mmol/L   Glucose, Bld 63 (L) 65 - 99 mg/dL   BUN 27 (H) 6 - 20 mg/dL   Creatinine, Ser 4.16 (H) 0.44 - 1.00 mg/dL   Calcium 8.0 (L) 8.9 - 10.3 mg/dL   Total Protein 6.8 6.5 - 8.1 g/dL   Albumin 1.9 (L) 3.5 - 5.0 g/dL   AST 46 (H) 15 - 41 U/L  ALT 47 14 - 54 U/L   Alkaline Phosphatase 249 (H) 38 - 126 U/L   Total Bilirubin 0.4 0.3 - 1.2 mg/dL   GFR calc non Af Amer 12 (L) >60 mL/min   GFR calc Af Amer 14 (L) >60 mL/min    Comment: (NOTE) The eGFR has been calculated using the CKD EPI equation. This calculation has not been validated in all clinical situations. eGFR's persistently <60 mL/min signify possible Chronic Kidney Disease.    Anion gap 11 5 - 15  CBC with Differential/Platelet     Status: Abnormal   Collection Time: 02/04/16  5:26 AM  Result Value Ref Range   WBC 3.8 (L) 4.0 - 10.5 K/uL    Comment:  REPEATED TO VERIFY   RBC 3.06 (L) 3.87 - 5.11 MIL/uL   Hemoglobin 7.9 (L) 12.0 - 15.0 g/dL    Comment: REPEATED TO VERIFY   HCT 25.9 (L) 36.0 - 46.0 %   MCV 84.6 78.0 - 100.0 fL   MCH 25.8 (L) 26.0 - 34.0 pg   MCHC 30.5 30.0 - 36.0 g/dL   RDW 17.7 (H) 11.5 - 15.5 %   Platelets 47 (L) 150 - 400 K/uL    Comment: SPECIMEN CHECKED FOR CLOTS REPEATED TO VERIFY PLATELET COUNT CONFIRMED BY SMEAR    Neutrophils Relative % 73 %   Neutro Abs 2.8 1.7 - 7.7 K/uL   Lymphocytes Relative 11 %   Lymphs Abs 0.4 (L) 0.7 - 4.0 K/uL   Monocytes Relative 7 %   Monocytes Absolute 0.3 0.1 - 1.0 K/uL   Eosinophils Relative 9 %   Eosinophils Absolute 0.3 0.0 - 0.7 K/uL   Basophils Relative 0 %   Basophils Absolute 0.0 0.0 - 0.1 K/uL  Ammonia     Status: None   Collection Time: 02/04/16  5:27 AM  Result Value Ref Range   Ammonia 28 9 - 35 umol/L  TSH     Status: Abnormal   Collection Time: 02/04/16  5:27 AM  Result Value Ref Range   TSH 5.379 (H) 0.350 - 4.500 uIU/mL    Comment: Performed by a 3rd Generation assay with a functional sensitivity of <=0.01 uIU/mL.  Cortisol     Status: None   Collection Time: 02/04/16  5:27 AM  Result Value Ref Range   Cortisol, Plasma 13.9 ug/dL    Comment: (NOTE) AM    6.7 - 22.6 ug/dL PM   <10.0       ug/dL   Type and screen [place]     Status: None   Collection Time: 02/04/16  5:51 AM  Result Value Ref Range   ABO/RH(D) B POS    Antibody Screen NEG    Sample Expiration 02/07/2016   Direct antiglobulin test (not at [facility])     Status: None   Collection Time: 02/04/16  5:55 AM  Result Value Ref Range   DAT, complement NEG    DAT, IgG NEG   CBG monitoring, ED     Status: None   Collection Time: 02/04/16  6:09 AM  Result Value Ref Range   Glucose-Capillary 71 65 - 99 mg/dL   Comment 1 Notify RN    Comment 2 Document in Chart   Influenza panel by PCR (type A & B, H1N1)     Status: None   Collection Time: 02/04/16  8:16 AM  Result  Value Ref Range   Influenza A By PCR NEGATIVE NEGATIVE   Influenza B By  PCR NEGATIVE NEGATIVE    Comment: (NOTE) The Xpert Xpress Flu assay is intended as an aid in the diagnosis of  influenza and should not be used as a sole basis for treatment.  This  assay is FDA approved for nasopharyngeal swab specimens only. Nasal  washings and aspirates are unacceptable for Xpert Xpress Flu testing.   Fibrinogen     Status: Abnormal   Collection Time: 02/04/16  8:53 AM  Result Value Ref Range   Fibrinogen 649 (H) 210 - 475 mg/dL  Technologist smear review     Status: None   Collection Time: 02/04/16  8:53 AM  Result Value Ref Range   Tech Review MILD LEFT SHIFT (1-5% METAS, OCC MYELO, OCC BANDS)     Comment: LARGE PLATELETS PRESENT  Vitamin B12     Status: None   Collection Time: 02/04/16  8:53 AM  Result Value Ref Range   Vitamin B-12 437 180 - 914 pg/mL    Comment: (NOTE) This assay is not validated for testing neonatal or myeloproliferative syndrome specimens for Vitamin B12 levels.   Procalcitonin - Baseline     Status: None   Collection Time: 02/04/16  8:53 AM  Result Value Ref Range   Procalcitonin 0.61 ng/mL    Comment:        Interpretation: PCT > 0.5 ng/mL and <= 2 ng/mL: Systemic infection (sepsis) is possible, but other conditions are known to elevate PCT as well. (NOTE)         ICU PCT Algorithm               Non ICU PCT Algorithm    ----------------------------     ------------------------------         PCT < 0.25 ng/mL                 PCT < 0.1 ng/mL     Stopping of antibiotics            Stopping of antibiotics       strongly encouraged.               strongly encouraged.    ----------------------------     ------------------------------       PCT level decrease by               PCT < 0.25 ng/mL       >= 80% from peak PCT       OR PCT 0.25 - 0.5 ng/mL          Stopping of antibiotics                                             encouraged.     Stopping of  antibiotics           encouraged.    ----------------------------     ------------------------------       PCT level decrease by              PCT >= 0.25 ng/mL       < 80% from peak PCT        AND PCT >= 0.5 ng/mL             Continuing antibiotics  encouraged.       Continuing antibiotics            encouraged.    ----------------------------     ------------------------------     PCT level increase compared          PCT > 0.5 ng/mL         with peak PCT AND          PCT >= 0.5 ng/mL             Escalation of antibiotics                                          strongly encouraged.      Escalation of antibiotics        strongly encouraged.   Lactate dehydrogenase     Status: None   Collection Time: 02/04/16  9:42 AM  Result Value Ref Range   LDH 132 98 - 192 U/L  Prepare Pheresed Platelets     Status: None (Preliminary result)   Collection Time: 02/04/16 11:14 AM  Result Value Ref Range   ISSUE DATE / TIME [id number]    Blood Product Unit Number [id number]    PRODUCT CODE [id number]    Unit Type and Rh 7300    Blood Product Expiration Date [id number]   CBG monitoring, ED     Status: None   Collection Time: 02/04/16 12:39 PM  Result Value Ref Range   Glucose-Capillary 68 65 - 99 mg/dL     ROS:  Not able to give me history   Physical Exam: Vitals:   02/04/16 1421 02/04/16 1430  BP: 109/83 115/80  Pulse: 100 100  Resp: 24 (!) 28  Temp: 97.2 F (36.2 C)      General: alert thin chronically ill  AAF oriented to place only, NAD, HEENT: [place]  , Anicteric,MM dry   Neck: supple, no jvd Heart: RRR no mur, gallop, or rub Lungs: Faint Left base crackles, otherwise decr bas at bases Abdomen: BS pos, soft , nd,nt Extremities:  Pedal edema R>L 1+ to trace L Skin: no overtrash  Of pedal ulcers Neuro: Encephalopathic /does move extrem  independently  Dialysis Access: POs bruit LUA AVF  Dialysis Orders: [facility]  on MWF  . EDW 51 HD Bath 2k, 2ca  Time 4hrs Heparin 1700. Access LUA AVF     Hec 5 mcg IV/HD   Mircera 189mg  Last on 01/26/16  q 2wks hd     Other op labs  hgb 9.5  Ca 10.1 phos 8.9  PTH 380  Assessment/Plan  1. ESRD - HD On MWF  2. Acute encephalopathy with hypothermia / suspected PNA - ID antibio./ admit team wu 3. HIV/ AIDS- ID seeing  4. Hypertension/volume  - BP stable / Pedal edema  uf to edw  5. Anemia  - hgb 7.9  Aranesp on hd 6. Metabolic bone disease -  Vit d / binders 7. Nutrition - protein supplement  With alb 1.9 / renal diet   DErnest Haber PA-C [phone]/05/2016, 2:41 PM

## 2016-02-04 NOTE — ED Notes (Signed)
Lab contacted about add ons

## 2016-02-04 NOTE — Procedures (Signed)
I was present at this dialysis session. I have reviewed the session itself and made appropriate changes.   2K bath.  UF goal 3.6L to achieve EDW using standing weight.  Pt AAO to self, lcation, month, year.  Says came in 2/2 diarrhea.  Using AVF, 16g, Qb 350. Next HD due 1/8.    Recent Labs Lab 02/04/16 0526  NA 138  K 4.2  CL 102  CO2 25  GLUCOSE 63*  BUN 27*  CREATININE 4.16*  CALCIUM 8.0*     Recent Labs Lab 02/02/16 1236 02/03/16 2214 02/04/16 0526  WBC 7.0 6.0 3.8*  NEUTROABS  --  4.7 2.8  HGB 9.7* 8.9* 7.9*  HCT 31.2* 28.2* 25.9*  MCV 84.8 84.9 84.6  PLT 55* 51* 47*    Scheduled Meds: . [START ON 02/07/2016] doxercalciferol  5 mcg Intravenous Q M,W,F-HD  . fluticasone  2 spray Each Nare Daily  . vancomycin  500 mg Intravenous Q M,W,F-HD   Continuous Infusions: . sodium chloride    . ceFEPime (MAXIPIME) IV Stopped (02/04/16 1146)   PRN Meds:.acetaminophen **OR** acetaminophen, albuterol, ondansetron **OR** ondansetron (ZOFRAN) IV   Pearson Grippe  MD 02/04/2016, 4:07 PM

## 2016-02-04 NOTE — ED Notes (Signed)
IR brought pt back to room. Unable to do LP due to platelets being low.

## 2016-02-04 NOTE — ED Notes (Signed)
Lab called and they cannot use gold top tube for Drug screen, needs to be 3 red tops. Will notify lab.

## 2016-02-04 NOTE — Progress Notes (Signed)
Pharmacy Antibiotic Note  Paula Becker is a 41 y.o. female admitted on 02/03/2016 with pneumonia.  Pharmacy has been consulted for Vancomycin and Cefepime dosing for HIV related HCAP. Pt has ESRD on HD MWF with last session 02/02/16.  Plan: Vancomycin 1g IV load followed by 500mg  qHD Cefepime 1g IV q24h Trend WBC, temp, HD schedule Drug levels as indicated   Temp (24hrs), Avg:94 F (34.4 C), Min:91.2 F (32.9 C), Max:98.3 F (36.8 C)   Recent Labs Lab 02/02/16 1236 02/03/16 2201 02/03/16 2214 02/04/16 0131 02/04/16 0526  WBC 7.0  --  6.0  --  3.8*  CREATININE 8.81*  --  4.07*  --  4.16*  LATICACIDVEN  --  1.46  --  1.03  --     CrCl cannot be calculated (Unknown ideal weight.).    Allergies  Allergen Reactions  . Shellfish-Derived Products Swelling    Facial swelling  . Vancomycin Rash    Andrey Cota. Diona Foley, PharmD, [place] Clinical Pharmacist Pager [phone] 02/04/2016 10:42 AM

## 2016-02-04 NOTE — ED Notes (Addendum)
Bare hugger removed. Rectal temp 98.3.

## 2016-02-04 NOTE — ED Notes (Signed)
Patient transported to MRI 

## 2016-02-04 NOTE — H&P (Addendum)
History and Physical    Paula Becker [id number] DOB: 04-Jan-1976 DOA: 02/03/2016  PCP: Bobby Rumpf, MD  Patient coming from: Home.  History obtained from ER physician as patient is encephalopathic and patient's family is unable to be reached.  Chief Complaint: Confusion and unresponsive episodes.  HPI: Paula Becker is a 41 y.o. female with history of multiresistant drug HIV noncompliant with medication, ESRD and hemodialysis, history of herpes genitalis, chronic anemia and thrombocytopenia was brought to the ER by patient's son. Patient's son found that patient has been getting increasingly confused with unresponsive episodes. Patient has had her dialysis yesterday after missing the last 2 because of weakness. In the ER patient is only minimally responsive to calling her name. On trying to open her eyes she is trying to ressist. Patient was found to be hypothermic. Chest x-ray shows bronchiectasis and bibasal infiltrates not changed from previous. Patient was admitted in September last year for healthcare associated pneumonia. CT head does not show anything acute except for calcification. Patient is being admitted for acute encephalopathy with hypothermia. On my exam patient is not in respiratory distress.   ED Course: CT head was showing nothing acute. Chest x-ray shows bibasilar infiltrates and bronchiectasis. Lab work showed thrombocytopenia and anemia.  Review of Systems: As per HPI, rest all negative.   Past Medical History:  Diagnosis Date  . Anemia of chronic disease   . ESRD on dialysis ([place])   . Herpes   . History of noncompliance with medical treatment   . HIV (human immunodeficiency virus infection) ([place])   . MRSA (methicillin resistant staph aureus) culture positive   . Necrotizing pneumonia ([place]) 07/2010  . Pneumonia 06/23/11   RLL patchy, nodular lung disease  . Renal disorder     Past Surgical History:  Procedure Laterality Date  . BASCILIC VEIN TRANSPOSITION  Left 03/16/2015   Procedure: BASCILIC VEIN TRANSPOSITION LEFT UPPER ARM;  Surgeon: Angelia Mould, MD;  Location: [facility];  Service: Vascular;  Laterality: Left;  . BREAST SURGERY  03/2010   right; "don't know what they did"  . CESAREAN SECTION  1998  . INSERTION OF DIALYSIS CATHETER N/A 03/16/2015   Procedure: INSERTION OF DIALYSIS CATHETER RIGHT INTERNAL JUGULAR;  Surgeon: Angelia Mould, MD;  Location: [facility];  Service: Vascular;  Laterality: N/A;  . VIDEO BRONCHOSCOPY  06/28/2011   Procedure: VIDEO BRONCHOSCOPY WITH FLUORO;  Surgeon: Chesley Mires, MD;  Location: [facility];  Service: Cardiopulmonary;  Laterality: Bilateral;     reports that she has never smoked. She has never used smokeless tobacco. She reports that she does not drink alcohol or use drugs.  Allergies  Allergen Reactions  . Shellfish-Derived Products Swelling    Facial swelling  . Vancomycin Rash    Family History  Problem Relation Age of Onset  . Lung disease Mother     passed away at 61 with lung disease  . Hypertension Father     Prior to Admission medications   Medication Sig Start Date End Date Taking? Authorizing Provider  acyclovir ointment (ZOVIRAX) 5 % Apply 1 application topically every 4 (four) hours as needed. 02/02/16   Ozella Almond Ward, PA-C  albuterol (PROVENTIL HFA;VENTOLIN HFA) 108 (90 Base) MCG/ACT inhaler Inhale 2 puffs into the lungs every 6 (six) hours as needed for wheezing or shortness of breath. 10/03/15   Ripudeep Krystal Eaton, MD  alum & mag hydroxide-simeth (MAALOX/MYLANTA) 200-200-20 MG/5ML suspension Take 30 mLs by mouth every 6 (six) hours as needed  for indigestion, heartburn or flatulence. 02/11/15   Burgess Estelle, MD  benzonatate (TESSALON PERLES) 100 MG capsule Take 1 capsule (100 mg total) by mouth 3 (three) times daily as needed for cough. For cough Patient not taking: Reported on 02/02/2016 10/03/15   Ripudeep Krystal Eaton, MD  ceFAZolin (ANCEF) 2-4 GM/100ML-% IVPB Inject 100 mLs (2 g  total) into the vein Every Tuesday,Thursday,and Saturday with dialysis. X 2 doses Patient not taking: Reported on 02/02/2016 10/05/15   Ripudeep Krystal Eaton, MD  Darbepoetin Alfa (ARANESP) 100 MCG/0.5ML SOSY injection Inject 0.5 mLs (100 mcg total) into the vein every Thursday with hemodialysis. 03/17/15   Iline Oven, MD  dolutegravir (TIVICAY) 50 MG tablet Take 1 tablet (50 mg total) by mouth 2 (two) times daily. 08/09/15   Campbell Riches, MD  doxercalciferol (HECTOROL) 4 MCG/2ML injection Inject 1 mL (2 mcg total) into the vein Every Tuesday,Thursday,and Saturday with dialysis. 03/17/15   Iline Oven, MD  feeding supplement (BOOST / RESOURCE BREEZE) LIQD Take 1 Container by mouth 3 (three) times daily between meals. 03/17/15   Iline Oven, MD  gabapentin (NEURONTIN) 300 MG capsule TAKE 1 CAPSULE BY MOUTH THREE TIMES DAILY Patient not taking: Reported on 02/02/2016 12/28/14   Campbell Riches, MD  guaiFENesin (MUCINEX) 600 MG 12 hr tablet Take 1 tablet (600 mg total) by mouth 2 (two) times daily. 10/03/15   Ripudeep Krystal Eaton, MD  HYDROcodone-acetaminophen (NORCO/VICODIN) 5-325 MG tablet Take 1 tablet by mouth every 4 (four) hours as needed. 12/27/15   Virgel Manifold, MD  hydrOXYzine (ATARAX/VISTARIL) 25 MG tablet Take 1 tablet (25 mg total) by mouth every 6 (six) hours as needed for itching. 10/03/15   Ripudeep Krystal Eaton, MD  ibuprofen (ADVIL,MOTRIN) 200 MG tablet Take 200 mg by mouth every 6 (six) hours as needed for moderate pain.    Historical Provider, MD  levofloxacin (LEVAQUIN) 500 MG tablet Take 1 tablet (500 mg total) by mouth every other day. 02/02/16   Jaime Pilcher Ward, PA-C  lopinavir-ritonavir Vevelyn Francois) 400-100 MG/5ML solution Take 10 mLs (800 mg total) by mouth daily with breakfast. 08/09/15   Campbell Riches, MD  multivitamin (RENA-VIT) TABS tablet Take 1 tablet by mouth at bedtime. Patient not taking: Reported on 09/29/2015 08/09/15   Campbell Riches, MD  sulfamethoxazole-trimethoprim  (BACTRIM) 400-80 MG/5ML injection Inject 10 mLs (160 mg of trimethoprim total) into the vein every Monday, Wednesday, and Friday. Patient not taking: Reported on 08/09/2015 06/09/15   Campbell Riches, MD  valACYclovir (VALTREX) 500 MG tablet Take 1 tablet (500 mg total) by mouth daily. Patient not taking: Reported on 09/29/2015 08/09/15   Campbell Riches, MD  VIREAD 300 MG tablet TAKE 1 TABLET BY MOUTH ONCE A WEEK 09/03/15   Campbell Riches, MD  zidovudine (RETROVIR) 100 MG capsule TAKE 3 CAPSULES(300 MG) BY MOUTH DAILY WITH BREAKFAST 10/08/15   Campbell Riches, MD    Physical Exam: Vitals:   02/04/16 0315 02/04/16 0318 02/04/16 0330 02/04/16 0345  BP: 113/81  107/76 112/79  Pulse: 88  90 90  Resp: 15  (!) 9 10  Temp:  (!) 92.9 F (33.8 C)    TempSrc:  Rectal    SpO2: 97%  92% 96%      Constitutional: Poorly built and nourished. Vitals:   02/04/16 0315 02/04/16 0318 02/04/16 0330 02/04/16 0345  BP: 113/81  107/76 112/79  Pulse: 88  90 90  Resp: 15  (!) 9 10  Temp:  (!) 92.9 F (33.8 C)    TempSrc:  Rectal    SpO2: 97%  92% 96%   Eyes: Anicteric no pallor. ENMT: No discharge from the ears eyes nose and mouth. Neck: No neck rigidity no mass felt. Respiratory: No rhonchi or crepitations. Cardiovascular: S1 and S2 heard. Abdomen: Soft nontender bowel sounds present. Musculoskeletal: No edema. Skin: No obvious rash. Neurologic: Patient is encephalopathic and does not follow commands. Pupils reacting. Psychiatric: Patient is encephalopathic and does not follow commands.   Labs on Admission: I have personally reviewed following labs and imaging studies  CBC:  Recent Labs Lab 02/02/16 1236 02/03/16 2214  WBC 7.0 6.0  NEUTROABS  --  4.7  HGB 9.7* 8.9*  HCT 31.2* 28.2*  MCV 84.8 84.9  PLT 55* 51*   Basic Metabolic Panel:  Recent Labs Lab 02/02/16 1236 02/03/16 2214  NA 148* 140  K 5.5* 4.1  CL 109 98*  CO2 21* 29  GLUCOSE 112* 83  BUN 78* 26*  CREATININE  8.81* 4.07*  CALCIUM 9.3 8.6*   GFR: CrCl cannot be calculated (Unknown ideal weight.). Liver Function Tests:  Recent Labs Lab 02/03/16 2214  AST 50*  ALT 54  ALKPHOS 277*  BILITOT 0.1*  PROT 8.4*  ALBUMIN 2.5*   No results for input(s): LIPASE, AMYLASE in the last 168 hours. No results for input(s): AMMONIA in the last 168 hours. Coagulation Profile:  Recent Labs Lab 02/04/16 0115  INR 1.10   Cardiac Enzymes: No results for input(s): CKTOTAL, CKMB, CKMBINDEX, TROPONINI in the last 168 hours. BNP (last 3 results) No results for input(s): PROBNP in the last 8760 hours. HbA1C: No results for input(s): HGBA1C in the last 72 hours. CBG:  Recent Labs Lab 02/02/16 1302  GLUCAP 150*   Lipid Profile: No results for input(s): CHOL, HDL, LDLCALC, TRIG, CHOLHDL, LDLDIRECT in the last 72 hours. Thyroid Function Tests: No results for input(s): TSH, T4TOTAL, FREET4, T3FREE, THYROIDAB in the last 72 hours. Anemia Panel: No results for input(s): VITAMINB12, FOLATE, FERRITIN, TIBC, IRON, RETICCTPCT in the last 72 hours. Urine analysis:    Component Value Date/Time   COLORURINE YELLOW 02/06/2015 2326   APPEARANCEUR CLOUDY (A) 02/06/2015 2326   LABSPEC 1.017 02/06/2015 2326   PHURINE 7.5 02/06/2015 2326   GLUCOSEU 100 (A) 02/06/2015 2326   GLUCOSEU NEG mg/dL 04/18/2010 2036   HGBUR TRACE (A) 02/06/2015 2326   BILIRUBINUR NEGATIVE 02/06/2015 2326   KETONESUR NEGATIVE 02/06/2015 2326   PROTEINUR >300 (A) 02/06/2015 2326   UROBILINOGEN 2.0 (H) 06/24/2011 0307   NITRITE NEGATIVE 02/06/2015 2326   LEUKOCYTESUR TRACE (A) 02/06/2015 2326   Sepsis Labs: @LABRCNTIP (procalcitonin:4,lacticidven:4) )No results found for this or any previous visit (from the past 240 hour(s)).   Radiological Exams on Admission: Dg Chest 2 View  Result Date: 02/03/2016 CLINICAL DATA:  Sepsis EXAM: CHEST  2 VIEW COMPARISON:  02/02/2016, 09/29/2015 FINDINGS: Hyperinflation is present. Bibasilar  bronchiectasis and bronchial thickening as before. No change in bibasilar infiltrates. Probable tiny effusions. Stable mild cardiomegaly without overt failure. No pneumothorax. Surgical clips in the left breast. IMPRESSION: No significant interval change in bibasilar bronchiectasis and bibasilar infiltrates with probable tiny effusions. Electronically Signed   By: Donavan Foil M.D.   On: 02/03/2016 23:02   Dg Chest 2 View  Result Date: 02/02/2016 CLINICAL DATA:  Productive cough and nausea for 1 day, history end-stage renal disease, HIV, pneumonia EXAM: CHEST  2 VIEW COMPARISON:  09/29/2015 FINDINGS: Normal heart size, mediastinal  contours, and pulmonary vascularity. Atherosclerotic calcification aorta. Hyperinflated lungs with bibasilar bronchiectasis. Bibasilar infiltrates versus atelectasis, increased on RIGHT. Tiny RIGHT pleural effusion, new. Mild diffuse interstitial prominence unchanged. No pneumothorax. Bones unremarkable. IMPRESSION: Emphysematous changes with bibasilar bronchiectasis, bibasilar infiltrates versus atelectasis increased on RIGHT, and tiny RIGHT pleural effusion. Electronically Signed   By: Lavonia Dana M.D.   On: 02/02/2016 13:21   Ct Head W Or Wo Contrast  Result Date: 02/04/2016 CLINICAL DATA:  Generalized weakness and diarrhea at dialysis today. Followup encephalopathy. AIDS. EXAM: CT HEAD WITHOUT AND WITH CONTRAST TECHNIQUE: Contiguous axial images were obtained from the base of the skull through the vertex without and with intravenous contrast CONTRAST:  75 cc ISOVUE-300 IOPAMIDOL (ISOVUE-300) INJECTION 61% COMPARISON:  CT HEAD April 05, 2010 FINDINGS: BRAIN: The ventricles and sulci are normal. No intraparenchymal hemorrhage, mass effect nor midline shift. No acute large vascular territory infarcts. Punctate RIGHT frontal parenchymal calcification. No abnormal extra-axial fluid collections. Basal cisterns are patent. VASCULAR: Mild calcific atherosclerosis of the carotid siphons.  SKULL/SOFT TISSUES: No skull fracture. No significant soft tissue swelling. ORBITS/SINUSES: The included ocular globes and orbital contents are normal.Mild paranasal sinusitis. Mastoid air cells are well aerated. Soft tissue within the external auditory canals compatible with cerumen. OTHER: None. IMPRESSION: No acute intracranial process. RIGHT frontal lobe punctate calcification most compatible with prior infection. Chronic sinusitis. Mild atherosclerosis. Electronically Signed   By: Elon Alas M.D.   On: 02/04/2016 01:05    EKG: Independently reviewed. Normal sinus rhythm.  Assessment/Plan Principal Problem:   Acute encephalopathy Active Problems:   Anemia of chronic disease   HIV disease ([place])   ESRD on dialysis ([place])   Hypothermia    1. Acute encephalopathy with hypothermia - primary suspect sepsis as a cause. I have ordered MRI to rule out any stroke. Check EEG. I do think patient is having active seizures since patient is responding to her name (and I have also discussed this with the neurologist). For possible sepsis and encephalopathy we are covering with empiric antibiotics for meningitis. Chest x-ray does show possible infiltrates and bronchiectasis which is chronic. However patient is on antibiotics. Since patient has thrombocytopenia with platelet counts of around 30 ER physician is hesitant to do lumbar puncture. Will type and screen and keep platelets ready and request fluoroscopic guided lumbar puncture during which transfuse platelets. Since patient is also hypothermic I have ordered TSH level cortisol level and check CBG. Until patient's temperature gets above 97.78F patient will be on warm blankets. Follow blood cultures. Follow blood drug screen. Check ammonia levels. 2. ESRD on hemodialysis on Monday Wednesday and Friday - has had dialysis yesterday. Please consult nephrology for dialysis. 3. HIV/AIDS - noncompliant with medications. Has history of multidrug-resistant  HIV. Check CD4 count and viral load. Consult infectious disease in a.m. 4. Chronic anemia and thrombocytopenia - follow CBC. Type and screen for platelets which may be needed during lumbar puncture.  Unable to reach patient's family.   DVT prophylaxis: SCDs. Code Status: Full code.  Family Communication: Unable to reach family.  Disposition Plan: To be determined.  Consults called: Discussed with neurologist.  Admission status: Inpatient.    Rise Patience MD [facility] Pager [phone].  If 7PM-7AM, please contact night-coverage [email or web address] Password [email or web address]  02/04/2016, 5:10 AM

## 2016-02-04 NOTE — Procedures (Signed)
Procedure: LP with Fluoro guidance at L2-3. Specimen: CSF, to lab Bleeding: minimal. Complications: None immediate. Patient   -Condition: Stable.  -Disposition:  Return to inpt floor.  Full Radiology Report to follow under IMAGING

## 2016-02-04 NOTE — Consult Note (Signed)
Neurology Consultation Reason for Consult: Encephalopathy Referring Physician: Marlowe Sax  CC: Encephalopathy  History is obtained from: Patient  HPI: Paula Becker is a 41 y.o. female the history of advanced untreated HIV who presents with abnormal MRI and encephalopathy. Family is not there for history when I examined the patient, however per her chart had been unresponsive episodes. She is only minimally responsive on arrival, and was hypothermic.  Infectious disease has evaluated the patient and suspects HCAP.  She had an MRI which demonstrated diffuse T2 changes, there was some question of procedure diffusion in the basal ganglia but I do not think this is definite.    ROS: A 14 point ROS was performed and is negative except as noted in the HPI.   Past Medical History:  Diagnosis Date  . Anemia of chronic disease   . ESRD on dialysis ([place])   . Herpes   . History of noncompliance with medical treatment   . HIV (human immunodeficiency virus infection) ([place])   . MRSA (methicillin resistant staph aureus) culture positive   . Necrotizing pneumonia ([place]) 07/2010  . Pneumonia 06/23/11   RLL patchy, nodular lung disease  . Renal disorder      Family History  Problem Relation Age of Onset  . Lung disease Mother     passed away at 68 with lung disease  . Hypertension Father      Social History:  reports that she has never smoked. She has never used smokeless tobacco. She reports that she does not drink alcohol or use drugs.   Exam: Current vital signs: BP 100/63   Pulse 112   Temp 98 F (36.7 C) (Oral)   Resp 20   Wt 54.6 kg (120 lb 5.9 oz)   LMP 01/16/2015   SpO2 96%   BMI 18.30 kg/m  Vital signs in last 24 hours: Temp:  [91.2 F (32.9 C)-98.3 F (36.8 C)] 98 F (36.7 C) (01/05 1600) Pulse Rate:  [72-112] 112 (01/05 1730) Resp:  [9-49] 20 (01/05 1730) BP: (100-127)/(63-92) 100/63 (01/05 1730) SpO2:  [90 %-100 %] 96 % (01/05 1630) Weight:  [54.6 kg (120 lb 5.9  oz)] 54.6 kg (120 lb 5.9 oz) (01/05 1600)   Physical Exam  Constitutional: Appears well-developed and well-nourished.  Psych: Affect appropriate to situation Eyes: No scleral injection HENT: No OP obstrucion Head: Normocephalic.  Cardiovascular: Normal rate and regular rhythm.  Respiratory: Effort normal and breath sounds normal to anterior ascultation GI: Soft.  No distension. There is no tenderness.  Skin: WDI  Neuro: Mental Status: Patient is awake, alert, oriented to person, place, month, year, but states that she is here due to diarrhea. She is able to name objects, no signs of neglect She does appear somnolent Cranial Nerves: II: Visual Fields are full. Pupils are equal, round, and reactive to light.  III,IV, VI: EOMI without ptosis or diploplia.  V: Facial sensation is symmetric to temperature VII: Facial movement is symmetric.  VIII: hearing is intact to voice X: Uvula elevates symmetrically XI: Shoulder shrug is symmetric. XII: tongue is midline without atrophy or fasciculations.  Motor: Tone is normal. Bulk is normal. 5/5 strength was present in all four extremities.  Sensory: Sensation is symmetric to light touch and temperature in the arms and legs. Deep Tendon Reflexes: 2+ and symmetric in the biceps and patellae.  Cerebellar: No clear ataxia   I have reviewed labs in epic and the results pertinent to this consultation are: CSF cell count 8 WBC  Mild elevated protein Mildly low glucose Crypto antigen is negative Fungal smear is pending  I have reviewed the images obtained: MRI brain-diffuse T2 change throughout the bilateral hemispheres as well as basal ganglia, there is some possible restricted diffusion in the basal ganglia, though I think it may be T2 shine through.  Impression: 41 yo F rapidly improving with antibiotic therapy. If her MRI changes were responsible for her encephalopathy, I would not expect her to be improving this rapidly. I think that  hypothermia and sepsis may been playing a significant role. Her MRI changes are unusual, I do wonder if they could represent a lymphoma. It would not be typical for this. Contrast could be very helpful, but due renal function this is not possible.  Recommendations: 1) CT angiogram of the head 2) EEG 3) neurology will continue to follow   Roland Rack, MD [facility] [phone]  If 7pm- 7am, please page neurology on call as listed in [place].

## 2016-02-04 NOTE — ED Notes (Signed)
Phlebotomy at bedside at this time.

## 2016-02-04 NOTE — Consult Note (Addendum)
[name] for Infectious Disease    Date of Admission:  02/03/2016    Day 1 vancomycin        Day 1 ceftriaxone        Day 1 ampicillin        Day 1 trimethoprim sulfamethoxazole        Day 1 acyclovir        Day 1 oseltamivir  Reason for Consult: Hypothermia and acute encephalopathy in the setting of advanced, untreated HIV infection    Referring Physician: Dr. Berle Mull  Principal Problem:   HCAP (healthcare-associated pneumonia) Active Problems:   Diarrhea   Acute encephalopathy   Hypothermia   HIV disease ([place])   ESRD on dialysis ([place])   Anemia of chronic disease   Severe protein-calorie malnutrition ([place])   HSV (herpes simplex virus) anogenital infection   Secondary hyperparathyroidism of renal origin ([place])   FSGS (focal segmental glomerulosclerosis)   Thrombocytopenia ([place])   . fluticasone  2 spray Each Nare Daily  . oseltamivir  30 mg Oral Daily    Recommendations: 1. Change empiric antibiotic therapy to vancomycin, cefepime and oseltamivir 2. Discontinue ceftriaxone, ampicillin, trimethoprim sulfamethoxazole and acyclovir  3. Await results of sputum and blood cultures and influenza PCRs 4. Check serum LDH and cryptococcal antigen 5. CD4 and HIV viral load 6. Discontinue supplemental oxygen  Assessment: I suspect that Paula Becker has HIV related HCAP causing her hypothermia and transient encephalopathy. Meningoencephalitis is much less likely given that she has had a fairly prompt neurologic recovery. I think it is reasonable to treat her with empiric vancomycin, cefepime and oseltamivir pending diagnostic studies. I do not feel that she needs empiric pneumocystis therapy as she is not hypoxic currently. If she has acute diarrhea while here I will do further testing for C. difficile, cryptosporidium and Mycobacterium avium. I will not restart antiretroviral therapy as there is little reason to believe she will be able to be adherent to this post  discharge. I will follow with you this weekend.    HPI: Paula Becker is a 41 y.o. female with advanced HIV infection and a long history of unwillingness/inability to adhere to her antiretroviral medications. She has end-stage renal disease and started hemodialysis in February of last year. She was brought to the emergency department yesterday by her son, who she lives with because of increasing confusion. She was found to be hypothermic and encephalopathic. She is now awake and alert and talkative. She states that over the past 3 days she has developed cough productive of green sputum with some shortness of breath, headache, diarrhea and some changes in her vision that she has difficulty explaining. She tells me that she is not taking any medications currently. She rates her headache as 7 out of 10. She states that she has intermittent headaches.   Review of Systems: Review of Systems  Constitutional: Positive for malaise/fatigue. Negative for chills, diaphoresis, fever and weight loss.  HENT: Negative for congestion and sore throat.   Eyes:       As noted in history of present illness.  Respiratory: Positive for cough, sputum production and shortness of breath. Negative for wheezing.   Cardiovascular: Negative for chest pain.  Gastrointestinal: Positive for diarrhea. Negative for abdominal pain, heartburn, nausea and vomiting.  Musculoskeletal: Positive for back pain and joint pain. Negative for myalgias.       She notes some recent mild, low back pain and pain in her  feet.  Skin: Negative for rash.  Neurological: Positive for headaches. Negative for dizziness and focal weakness.  Psychiatric/Behavioral: Positive for depression. Negative for substance abuse. The patient is not nervous/anxious.     Past Medical History:  Diagnosis Date  . Anemia of chronic disease   . ESRD on dialysis ([place])   . Herpes   . History of noncompliance with medical treatment   . HIV (human immunodeficiency  virus infection) ([place])   . MRSA (methicillin resistant staph aureus) culture positive   . Necrotizing pneumonia ([place]) 07/2010  . Pneumonia 06/23/11   RLL patchy, nodular lung disease  . Renal disorder     Social History  Substance Use Topics  . Smoking status: Never Smoker  . Smokeless tobacco: Never Used  . Alcohol use No    Family History  Problem Relation Age of Onset  . Lung disease Mother     passed away at 74 with lung disease  . Hypertension Father    Allergies  Allergen Reactions  . Shellfish-Derived Products Swelling    Facial swelling  . Vancomycin Rash    OBJECTIVE: Blood pressure 114/84, pulse 95, temperature 98.3 F (36.8 C), temperature source Rectal, resp. rate (!) 39, last menstrual period 01/16/2015, SpO2 99 %.  Physical Exam  Constitutional: She is oriented to person, place, and time.  She is alert and in no distress resting quietly in bed.  HENT:  Mouth/Throat: No oropharyngeal exudate.  Eyes: Conjunctivae are normal.  Neck: Neck supple.  Cardiovascular: Normal rate and regular rhythm.   No murmur heard. Pulmonary/Chest: Effort normal. She has no wheezes. She has rales.  She has some crackles in her lung bases posteriorly. Her O2 saturation on room air is 95-98%.  Abdominal: Soft. She exhibits no mass. There is no tenderness.  Musculoskeletal: Normal range of motion. She exhibits no edema or tenderness.  Neurological: She is alert and oriented to person, place, and time.  Skin: No rash noted.  She has scattered hyperpigmented scars from previous boils and folliculitis.  Psychiatric: Mood and affect normal.    Lab Results Lab Results  Component Value Date   WBC 3.8 (L) 02/04/2016   HGB 7.9 (L) 02/04/2016   HCT 25.9 (L) 02/04/2016   MCV 84.6 02/04/2016   PLT 47 (L) 02/04/2016    Lab Results  Component Value Date   CREATININE 4.16 (H) 02/04/2016   BUN 27 (H) 02/04/2016   NA 138 02/04/2016   K 4.2 02/04/2016   CL 102 02/04/2016   CO2 25  02/04/2016    Lab Results  Component Value Date   ALT 47 02/04/2016   AST 46 (H) 02/04/2016   ALKPHOS 249 (H) 02/04/2016   BILITOT 0.4 02/04/2016    HIV 1 RNA Quant (copies/mL)  Date Value  09/29/2015 33,300  02/07/2015 20,700  09/30/2014 103 (H)   CD4 T Cell Abs (/uL)  Date Value  09/29/2015 20 (L)  02/07/2015 40 (L)  09/30/2014 60 (L)   Microbiology: No results found for this or any previous visit (from the past 240 hour(s)).  Chest x-ray 02/02/2016  IMPRESSION: Emphysematous changes with bibasilar bronchiectasis, bibasilar infiltrates versus atelectasis increased on RIGHT, and tiny RIGHT pleural effusion.  Electronically Signed   By: Lavonia Dana M.D.   On: 02/02/2016 13:21   Michel Bickers, MD [facility] [phone] pager   [phone] cell 02/04/2016, 10:01 AM

## 2016-02-04 NOTE — ED Notes (Signed)
Report given to HD RN 

## 2016-02-04 NOTE — Progress Notes (Signed)
[facility] PROGRESS NOTE  Patient: Paula Becker [id number]   PCP: Bobby Rumpf, MD DOB: [birth year]   DOA: 02/03/2016   DOS: 02/04/2016    Subjective: Patient was drowsy and lethargic on initial evaluation. On repeat evaluation was much awake and oriented.  Objective:  Vitals:   02/04/16 1445 02/04/16 1500  BP: 113/86 110/82  Pulse: 101 99  Resp: (!) 29 (!) 30  Temp:      Bilateral peritonsillar present, right basal crackles.  Bowel sounds present. S1 and S2 present. Aortic systolic murmur. Trace edema bilaterally.  Assessment and plan: Suspected sepsis. Infectious diseases consulted, antibiotic change HCAP coverage for meningitis. Lumbar puncture performed. We'll follow the results.  Acute encephalopathy. HIV vasculitis. 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 Neurology consulted. Lumbar punch performed. Cytology sent. We'll monitor. EEG performed.  ESRD on hemodialysis Monday Wednesday Friday. Mr. hemodialysis treatment recently. Nephrology consulted.  Author: Berle Mull, MD [facility] Pager: [phone] 02/04/2016 4:29 PM   If 7PM-7AM, please contact night-coverage at [email or web address], password [facility]

## 2016-02-04 NOTE — ED Notes (Signed)
Pt in CT at this time.

## 2016-02-04 NOTE — Procedures (Signed)
ELECTROENCEPHALOGRAM REPORT  Date of Study: 02/04/2016  Patient's Name: Paula Becker MRN: [id number] Date of Birth: 1975/06/26  Referring Provider: Dr. Gean Birchwood  Clinical History: This is a 41 year old woman with increasing confusion with unresponsive episodes.  Medications: acetaminophen (TYLENOL) tablet 650 mg  albuterol (PROVENTIL) (2.5 MG/3ML) 0.083% nebulizer solution 2.5 mg  ceFEPIme (MAXIPIME) 1 g in dextrose 5 % 50 mL IVPB  fluticasone (FLONASE) 50 MCG/ACT nasal spray 2 spray  oseltamivir (TAMIFLU) capsule 30 mg  vancomycin (VANCOCIN) 500 mg in sodium chloride 0.9 % 100 mL IVPB   Technical Summary: A multichannel digital EEG recording measured by the international 10-20 system with electrodes applied with paste and impedances below 5000 ohms performed as portable with EKG monitoring in an awake and asleep patient.  Hyperventilation and photic stimulation were not performed.  The digital EEG was referentially recorded, reformatted, and digitally filtered in a variety of bipolar and referential montages for optimal display.   Description: The patient is awake and asleep during the recording. There is no clear posterior dominant rhythm. The background consists of a large amount of diffuse 4-6 Hz theta and occasional diffuse 2-3 Hz delta slowing.  During drowsiness and sleep, there is an increase in theta and delta slowing of the background with occasional poorly formed vertex waves seen. Hyperventilation and photic stimulation were not performed.  There were no epileptiform discharges or electrographic seizures seen.    EKG lead was unremarkable.  Impression: This awake and asleep EEG is abnormal due to moderate diffuse slowing of the waking background.  Clinical Correlation of the above findings indicates diffuse cerebral dysfunction that is non-specific in etiology and can be seen with hypoxic/ischemic injury, toxic/metabolic encephalopathies, or medication effect.  The  absence of epileptiform discharges does not rule out a clinical diagnosis of epilepsy.  Clinical correlation is advised.   [name], M.D.

## 2016-02-04 NOTE — Progress Notes (Signed)
Portable EEG completed; results pending  

## 2016-02-04 NOTE — Progress Notes (Addendum)
Pharmacy Antibiotic Note  Paula Becker is a 41 y.o. female admitted on 02/03/2016 with sepsis.  Pharmacy has been consulted for Vancomycin/Cefepime/Ampicillin/Acyclovir dosing for rule out meningitis. Pt has ESRD on HD. Also adding IV Bactrim for PJP coverage.  Plan: -Vancomycin 1000 mg IV x 1, then f/u HD schedule (some notes says MWF and some say TTS) -Cefepime 1g IV q24h -Acyclovir 5 mg/kg IV q24h -Ampicillin 2g IV q12h -Bactrim 10 mg/kg IV q24h -Trend WBC, temp, HD schedule -Drug levels as indicated  -F/U meningitis work-up  Temp (24hrs), Avg:92.4 F (33.6 C), Min:91.2 F (32.9 C), Max:93.5 F (34.2 C)   Recent Labs Lab 02/02/16 1236 02/03/16 2201 02/03/16 2214 02/04/16 0131  WBC 7.0  --  6.0  --   CREATININE 8.81*  --  4.07*  --   LATICACIDVEN  --  1.46  --  1.03    CrCl cannot be calculated (Unknown ideal weight.).    Allergies  Allergen Reactions  . Shellfish-Derived Products Swelling    Facial swelling  . Vancomycin Rash    Narda Bonds 02/04/2016 2:26 AM

## 2016-02-04 NOTE — ED Notes (Signed)
Pt only has labored respirations when she is sleep

## 2016-02-04 NOTE — ED Notes (Signed)
IR to come get pt around 8 for lumbar puncture.

## 2016-02-04 NOTE — ED Notes (Signed)
Paged [facility] 

## 2016-02-04 NOTE — ED Notes (Addendum)
Antibiotics and fluids placed on pause while patient is taken to MRI for testing via stretcher.

## 2016-02-04 NOTE — Progress Notes (Signed)
Pt to have Angio of head but needs new IV.  Must have a 20g in [facility] or close to it for this exam.  Cannot use her port

## 2016-02-05 ENCOUNTER — Inpatient Hospital Stay (HOSPITAL_COMMUNITY): Payer: Medicare Other

## 2016-02-05 ENCOUNTER — Encounter (HOSPITAL_COMMUNITY): Payer: Self-pay | Admitting: *Deleted

## 2016-02-05 DIAGNOSIS — L8990 Pressure ulcer of unspecified site, unspecified stage: Secondary | ICD-10-CM | POA: Diagnosis present

## 2016-02-05 DIAGNOSIS — R197 Diarrhea, unspecified: Secondary | ICD-10-CM

## 2016-02-05 DIAGNOSIS — A0472 Enterocolitis due to Clostridium difficile, not specified as recurrent: Secondary | ICD-10-CM | POA: Diagnosis present

## 2016-02-05 LAB — GLUCOSE, CAPILLARY
GLUCOSE-CAPILLARY: 73 mg/dL (ref 65–99)
GLUCOSE-CAPILLARY: 87 mg/dL (ref 65–99)
GLUCOSE-CAPILLARY: 76 mg/dL (ref 65–99)
Glucose-Capillary: 80 mg/dL (ref 65–99)
Glucose-Capillary: 106 mg/dL — ABNORMAL HIGH (ref 65–99)

## 2016-02-05 LAB — PREPARE PLATELET PHERESIS
Blood Product Expiration Date: 201801052359
ISSUE DATE / TIME: 201801051214
UNIT TYPE AND RH: 7300

## 2016-02-05 LAB — COMPREHENSIVE METABOLIC PANEL
ALBUMIN: 2.1 g/dL — AB (ref 3.5–5.0)
ALT: 57 U/L — ABNORMAL HIGH (ref 14–54)
ANION GAP: 11 (ref 5–15)
AST: 65 U/L — ABNORMAL HIGH (ref 15–41)
Alkaline Phosphatase: 250 U/L — ABNORMAL HIGH (ref 38–126)
BILIRUBIN TOTAL: 0.6 mg/dL (ref 0.3–1.2)
BUN: 10 mg/dL (ref 6–20)
CO2: 28 mmol/L (ref 22–32)
Calcium: 8.6 mg/dL — ABNORMAL LOW (ref 8.9–10.3)
Chloride: 99 mmol/L — ABNORMAL LOW (ref 101–111)
Creatinine, Ser: 2.5 mg/dL — ABNORMAL HIGH (ref 0.44–1.00)
GFR calc Af Amer: 26 mL/min — ABNORMAL LOW (ref >60)
GFR, EST NON AFRICAN AMERICAN: 23 mL/min — AB (ref >60)
Glucose, Bld: 69 mg/dL (ref 65–99)
POTASSIUM: 3.6 mmol/L (ref 3.5–5.1)
Sodium: 138 mmol/L (ref 135–145)
TOTAL PROTEIN: 7.4 g/dL (ref 6.5–8.1)

## 2016-02-05 LAB — C DIFFICILE QUICK SCREEN W PCR REFLEX
C DIFFICILE (CDIFF) TOXIN: POSITIVE — AB
C DIFFICLE (CDIFF) ANTIGEN: POSITIVE — AB
C Diff interpretation: DETECTED

## 2016-02-05 LAB — CBC WITH DIFFERENTIAL/PLATELET
BASOS ABS: 0 10*3/uL (ref 0.0–0.1)
Basophils Relative: 0 %
Eosinophils Absolute: 0.3 10*3/uL (ref 0.0–0.7)
Eosinophils Relative: 6 %
HEMATOCRIT: 30.4 % — AB (ref 36.0–46.0)
HEMOGLOBIN: 9.7 g/dL — AB (ref 12.0–15.0)
LYMPHS PCT: 4 %
Lymphs Abs: 0.2 10*3/uL — ABNORMAL LOW (ref 0.7–4.0)
MCH: 27.2 pg (ref 26.0–34.0)
MCHC: 31.9 g/dL (ref 30.0–36.0)
MCV: 85.2 fL (ref 78.0–100.0)
Monocytes Absolute: 0.3 10*3/uL (ref 0.1–1.0)
Monocytes Relative: 7 %
Neutro Abs: 4.1 10*3/uL (ref 1.7–7.7)
Neutrophils Relative %: 84 %
PLATELETS: 29 10*3/uL — AB (ref 150–400)
RBC: 3.57 MIL/uL — AB (ref 3.87–5.11)
RDW: 18.1 % — ABNORMAL HIGH (ref 11.5–15.5)
WBC: 4.9 10*3/uL (ref 4.0–10.5)

## 2016-02-05 LAB — HIV-1 RNA QUANT-NO REFLEX-BLD
HIV 1 RNA QUANT: 220000 {copies}/mL
LOG10 HIV-1 RNA: 5.342 log10copy/mL

## 2016-02-05 LAB — MRSA PCR SCREENING: MRSA by PCR: NEGATIVE

## 2016-02-05 LAB — MAGNESIUM: Magnesium: 1.9 mg/dL (ref 1.7–2.4)

## 2016-02-05 LAB — HEPARIN INDUCED PLATELET AB (HIT ANTIBODY): HEPARIN INDUCED PLT AB: 0.358 {OD_unit} (ref 0.000–0.400)

## 2016-02-05 MED ORDER — VANCOMYCIN 50 MG/ML ORAL SOLUTION
500.0000 mg | Freq: Four times a day (QID) | ORAL | Status: DC
  Administered 2016-02-05 – 2016-02-09 (×14): 500 mg via ORAL
  Filled 2016-02-05 (×18): qty 10

## 2016-02-05 MED ORDER — HYDROCORTISONE 1 % EX CREA
TOPICAL_CREAM | Freq: Two times a day (BID) | CUTANEOUS | Status: DC
  Administered 2016-02-05 – 2016-02-09 (×8): via TOPICAL
  Filled 2016-02-05 (×2): qty 28

## 2016-02-05 MED ORDER — IOPAMIDOL (ISOVUE-370) INJECTION 76%
50.0000 mL | Freq: Once | INTRAVENOUS | Status: AC | PRN
  Administered 2016-02-05: 50 mL via INTRAVENOUS

## 2016-02-05 NOTE — Progress Notes (Signed)
Patient is back from CT.

## 2016-02-05 NOTE — Progress Notes (Signed)
Admit: 02/03/2016 LOS: 1  Paula Becker ESRD, AIDS, admit with AMS, hypthermia, HCAp  Subjective:  Neuro and [facility] following for HCAP and AMS with MRI changes HD yesterday: 2L UF  Some hypoxia on RA needs 2L    01/05 0701 - 01/06 0700 In: 440 [P.O.:240; IV Piggyback:200] Out: 2001 [Stool:1]  Filed Weights   02/04/16 1600 02/04/16 2215 02/05/16 0645  Weight: 54.6 kg (120 lb 5.9 oz) 54.5 kg (120 lb 2.4 oz) 55 kg (121 lb 4.1 oz)    Scheduled Meds: . sodium chloride   Intravenous Once  . ceFEPime (MAXIPIME) IV  1 g Intravenous Q24H  . [START ON 02/07/2016] doxercalciferol  5 mcg Intravenous Q M,W,F-HD  . fluticasone  2 spray Each Nare Daily  . hydrocortisone cream   Topical BID  . vancomycin  500 mg Intravenous Q M,W,F-HD   Continuous Infusions: PRN Meds:.acetaminophen **OR** acetaminophen, albuterol, ondansetron **OR** ondansetron (ZOFRAN) IV  Current Labs: reviewed   Dialysis Orders: [facility]  on MWF . EDW 51 HD [place] 2k, 2ca  Time 4hrs Heparin 1700. Access LUA AVF     Hec 5 mcg IV/HD   Mircera 137mcg  Last on 01/26/16  q 2wks hd     Other op labs  hgb 9.5  Ca 10.1 phos 8.9  PTH 380  Physical Exam:  Blood pressure 115/86, pulse 80, temperature 97.3 F (36.3 C), temperature source Axillary, resp. rate (!) 22, height 5\' 4"  (1.626 m), weight 55 kg (121 lb 4.1 oz), last menstrual period 01/16/2015, SpO2 96 %. Chronically ill appearing RRR no mgr Diminished in bases LUE AVF +B/T S/nt/nd  A 1. ESRD [facility] MWF using AVF 2. AMS / Hypothermia; abnl MRI; neuro and [facility] following 3. HCAP on Vanc Cefepime  4. Anemia on ESA 5. PCM 6. 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 Next HD Monday on schedule   Pearson Grippe MD 02/05/2016, 10:53 AM   Recent Labs Lab 02/03/16 2214 02/04/16 0526 02/05/16 0253  NA 140 138 138  K 4.1 4.2 3.6  CL 98* 102 99*  CO2 29 25 28   GLUCOSE 83 63* 69  BUN 26* 27* 10  CREATININE 4.07* 4.16* 2.50*  CALCIUM 8.6* 8.0* 8.6*    Recent Labs Lab 02/03/16 2214  02/04/16 0526 02/04/16 2212 02/05/16 0253  WBC 6.0 3.8* 8.7 4.9  NEUTROABS 4.7 2.8  --  4.1  HGB 8.9* 7.9* 8.8* 9.7*  HCT 28.2* 25.9* 27.8* 30.4*  MCV 84.9 84.6 84.8 85.2  PLT 51* 47* 36* 29*

## 2016-02-05 NOTE — Progress Notes (Signed)
Subjective: Waxing and waning mental status  Exam: Vitals:   02/05/16 1650 02/05/16 1800  BP: 139/89 138/85  Pulse: 93 87  Resp: (!) 32 (!) 35  Temp:     Gen: In bed, NAD Resp: non-labored breathing, no acute distress Abd: soft, nt  Neuro: MS: Awakens to mild stimuli, follows commands and answers simple questions CN: Pupils equal round and reactive, extraocular movements intact Motor: Follows commands in all 4 extremities Sensory: Endorses sensation symmetrically.  Labs: Positive C-diff   Impression: 41 year old female with history of HIV and noncompliance with HIV medications who has frank aids with abnormal MRI. It is possible that the MRI findings are less related to her encephalopathy then her lung infection. If this does represent a HIV vasculitis,  I think treatment is going to predominantly include haart therapy  Recommendations: 1) CTA of the head 2) will follow  Roland Rack, MD [facility] [phone]  If 7pm- 7am, please page neurology on call as listed in [place].

## 2016-02-05 NOTE — Progress Notes (Signed)
CRITICAL VALUE ALERT  Critical value received: Platelet count  Date of notification: 02/05/2016  Time of notification: [id number]  Critical value read back:Yes.    Nurse who received alert:  A. Johm Pfannenstiel  MD notified (1st page): M. Donnal Debar, NP  Time of first page: [phone]  MD notified (2nd page):  Time of second page:  Responding MD: M.Lynch,NP  Time MD responded: [phone]

## 2016-02-05 NOTE — Progress Notes (Signed)
Md Posey Pronto paged to notify of critical value of positive c.diff. Enteric precautions continued. Will continue to monitor.

## 2016-02-05 NOTE — Progress Notes (Signed)
[facility] for Infectious Disease  Date of Admission:  02/03/2016           Day 2 vancomycin        Day 2 cefepime  Principal Problem:   HCAP (healthcare-associated pneumonia) Active Problems:   Diarrhea   Acute encephalopathy   HIV disease ([place])   ESRD on dialysis ([place])   Anemia of chronic disease   Severe protein-calorie malnutrition ([facility])   HSV (herpes simplex virus) anogenital infection   Secondary hyperparathyroidism of renal origin ([place])   FSGS (focal segmental glomerulosclerosis)   Thrombocytopenia ([facility])   Pressure injury of skin   . ceFEPime (MAXIPIME) IV  1 g Intravenous Q24H  . [START ON 02/07/2016] doxercalciferol  5 mcg Intravenous Q M,W,F-HD  . fluticasone  2 spray Each Nare Daily  . hydrocortisone cream   Topical BID  . vancomycin  500 mg Intravenous Q M,W,F-HD    SUBJECTIVE: She states that she is feeling a little bit better. She still has a cough productive of yellow sputum. She is still having diarrhea. She denies headache.  Review of Systems: Review of Systems  Constitutional: Positive for malaise/fatigue and weight loss. Negative for chills, diaphoresis and fever.  HENT: Negative for sore throat.   Respiratory: Positive for cough and sputum production. Negative for shortness of breath.   Cardiovascular: Negative for chest pain.  Gastrointestinal: Positive for diarrhea. Negative for abdominal pain, heartburn, nausea and vomiting.  Genitourinary: Negative for dysuria and frequency.  Musculoskeletal: Negative for joint pain and myalgias.  Skin: Negative for rash.  Neurological: Positive for weakness. Negative for headaches.    Past Medical History:  Diagnosis Date  . Anemia of chronic disease   . ESRD on dialysis ([place])   . Herpes   . History of noncompliance with medical treatment   . HIV (human immunodeficiency virus infection) ([place])   . MRSA (methicillin resistant staph aureus) culture positive   . Necrotizing pneumonia ([place])  07/2010  . Pneumonia 06/23/11   RLL patchy, nodular lung disease  . Renal disorder     Social History  Substance Use Topics  . Smoking status: Never Smoker  . Smokeless tobacco: Never Used  . Alcohol use No    Family History  Problem Relation Age of Onset  . Lung disease Mother     passed away at 50 with lung disease  . Hypertension Father    Allergies  Allergen Reactions  . Shellfish-Derived Products Swelling    Facial swelling  . Vancomycin Rash    OBJECTIVE: Vitals:   02/05/16 1000 02/05/16 1137 02/05/16 1410 02/05/16 1533  BP: 90/65 112/88 (!) 145/83   Pulse: 83 85 92   Resp: 15 (!) 38 (!) 38   Temp:  97.5 F (36.4 C)  97.6 F (36.4 C)  TempSrc:  Oral  Oral  SpO2: 94% 94% 97%   Weight:      Height:       Body mass index is 20.81 kg/m.  Physical Exam  Constitutional: She is oriented to person, place, and time.  She is alert and resting quietly in bed. She is in good spirits and extends her hand to shake mine when I entered the room.  HENT:  Mouth/Throat: No oropharyngeal exudate.  Eyes: Conjunctivae are normal.  Neck: Neck supple.  Cardiovascular: Normal rate and regular rhythm.   No murmur heard. Pulmonary/Chest: Effort normal and breath sounds normal. She has no wheezes. She has no rales.  Abdominal: Soft. There is no tenderness.  Musculoskeletal: Normal range of motion. She exhibits no edema or tenderness.  Neurological: She is alert and oriented to person, place, and time.  Her nurse reported to me that her mental status has been waxing and waning.  Skin: No rash noted.  Psychiatric: Mood and affect normal.    Lab Results Lab Results  Component Value Date   WBC 4.9 02/05/2016   HGB 9.7 (L) 02/05/2016   HCT 30.4 (L) 02/05/2016   MCV 85.2 02/05/2016   PLT 29 (LL) 02/05/2016    Lab Results  Component Value Date   CREATININE 2.50 (H) 02/05/2016   BUN 10 02/05/2016   NA 138 02/05/2016   K 3.6 02/05/2016   CL 99 (L) 02/05/2016   CO2 28  02/05/2016    Lab Results  Component Value Date   ALT 57 (H) 02/05/2016   AST 65 (H) 02/05/2016   ALKPHOS 250 (H) 02/05/2016   BILITOT 0.6 02/05/2016    CSF analyses: White blood cells 8, protein 57, glucose 38, no organisms on Gram stain, cryptococcal antigen negative  Microbiology: Recent Results (from the past 240 hour(s))  Culture, blood (Routine X 2) w Reflex to ID Panel     Status: None (Preliminary result)   Collection Time: 02/03/16  9:45 PM  Result Value Ref Range Status   Specimen Description BLOOD RIGHT ARM  Final   Special Requests IN PEDIATRIC BOTTLE 5ML  Final   Culture NO GROWTH 2 DAYS  Final   Report Status PENDING  Incomplete  Culture, blood (Routine X 2) w Reflex to ID Panel     Status: None (Preliminary result)   Collection Time: 02/03/16 10:07 PM  Result Value Ref Range Status   Specimen Description BLOOD RIGHT WRIST  Final   Special Requests IN PEDIATRIC BOTTLE 1ML  Final   Culture NO GROWTH 2 DAYS  Final   Report Status PENDING  Incomplete  CSF culture     Status: None (Preliminary result)   Collection Time: 02/04/16  2:04 PM  Result Value Ref Range Status   Specimen Description CSF  Final   Special Requests NONE  Final   Gram Stain   Final    WBC PRESENT, PREDOMINANTLY MONONUCLEAR NO ORGANISMS SEEN CYTOSPIN SMEAR    Culture NO GROWTH 1 DAY  Final   Report Status PENDING  Incomplete  MRSA PCR Screening     Status: None   Collection Time: 02/04/16 10:26 PM  Result Value Ref Range Status   MRSA by PCR NEGATIVE NEGATIVE Final    Comment:        The GeneXpert MRSA Assay (FDA approved for NASAL specimens only), is one component of a comprehensive MRSA colonization surveillance program. It is not intended to diagnose MRSA infection nor to guide or monitor treatment for MRSA infections.      ASSESSMENT: She is doing better today. I still think pneumonia is the most likely cause of her acute presentation yesterday with hypothermia and  encephalopathy but she is also having diarrhea and may very well have Mycobacterium avium infection or other HIV related GI pathogens. Her CSF shows very minor changes. 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  PLAN: 1. Continue vancomycin and cefepime 2. Stool for GI panel and AFB smear and cultures  Michel Bickers, MD [facility] for [place] [phone] pager   [phone] cell 02/05/2016, 3:43 PM

## 2016-02-05 NOTE — Progress Notes (Signed)
[facility] Progress Note  Patient: Paula Becker [id number]   PCP: Bobby Rumpf, MD DOB: 1975/03/13   DOA: 02/03/2016   DOS: 02/05/2016   Date of Service: the patient was seen and examined on 02/05/2016  Brief hospital course: Pt. with PMH of ESRD on HD MWF, HIV noncompliant with medication, herpes, chronic bicytopenia; admitted on 02/03/2016, with complaint of cough and diarrhea, was found to have C. difficile colitis as well as HCAP. Nephrology consulted for continuation of hemodialysis. Neurology consulted for HIV vasculitis as well as acute restrictive disease seen on the MRI. Currently further plan is continue antibiotics.  Assessment and Plan: 1. HCAP (healthcare-associated pneumonia) Appreciate input from ID. Patient is currently on vancomycin and cefepime. We will monitor the course.  2. C. difficile colitis. Present on admission Patient had diarrhea prior to admission and had persistent diarrhea here in the hospital as well. C. difficile was sent out. It was positive. Pharmacy consulted and the patient started on oral vancomycin and high-dose given immunocompromise status.  3. Acute encephalopathy with hypothermia. HIV vasculitis. 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 Suspected meningitis. Meningitis currently ruled out based on the CSF picture. Encephalopathy improved significantly after improvement in hypothermia. Patient had an MRI of the brain which is showing possible acute ischemia associated with HIV vasculitis. Neurology was consulted. Does not appear to be having any acute stroke at present. Patient would still benefit from an CT scan with contrast for HIV vasculitis. Although the treatment for patient's HIV was colitis would be HAART which the patient had adamantly refused multiple times in the past. Possibility of CNS lymphoma cannot be ruled out given HIV status, follow up on cytology. EEG unremarkable. Appreciate nephrology input.   4. ESRD on hemodialysis.   patient missed her hemodialysis treatment Monday and Wednesday. Appreciate nephrology input. Continue regular scheduled HD.  5. acute on chronic thrombocytopenia. Chronic anemia, anemia of chronic kidney disease. Patient has significant drop in her platelet count, does not appear to be TTP HUS or DIC-like picture with normal INR and a normal fibrinogen level. No schistocytes seen on the smear as well. HIT sent. This is likely appears to be acute response to sepsis. We will monitor. Patient is S/P 1 platelet transfusion for LP. No active bleeding currently reported.  6. dysphagia. Speech therapy consulted. Currently on clear liquid diet due to lethargy.  7. History of HIV. Patient with CD4 count in 20. Refused to remain compliant with HIV treatment in the past. We will currently monitor.   8. protein calorie malnutrition. Patient will be started on supplements once diet is advance.  9. stage II left buttock ulcer. Present on admission. Foam dressing. Daily change.  Bowel regimen: last BM 02/05/2016  Diet: Clear liquid diet  DVT Prophylaxis: mechanical compression device  Advance goals of care discussion: Full code  Family Communication: no family was present at bedside, at the time of interview.   Disposition:  Discharge to home. Expected discharge date: 02/09/2016, improvement in symptoms  Consultants: Nephrology, neurology, infectious disease, intervention radiology  Procedures: Hemodialysis, IR guided lumbar puncture, platelet transfusion  Antibiotics: Anti-infectives    Start     Dose/Rate Route Frequency Ordered Stop   02/05/16 1800  vancomycin (VANCOCIN) 50 mg/mL oral solution 500 mg     500 mg Oral Every 6 hours 02/05/16 1711 02/19/16 1759   02/04/16 1801  vancomycin (VANCOCIN) 500-5 MG/100ML-% IVPB    Comments:  Cherylann Banas   : cabinet override      02/04/16 1801  02/04/16 1942   02/04/16 1600  vancomycin (VANCOCIN) 500 mg in sodium chloride 0.9 % 100 mL  IVPB     500 mg 100 mL/hr over 60 Minutes Intravenous Every M-W-F (Hemodialysis) 02/04/16 1051     02/04/16 1100  ceFEPIme (MAXIPIME) 1 g in dextrose 5 % 50 mL IVPB     1 g 100 mL/hr over 30 Minutes Intravenous Every 24 hours 02/04/16 1044     02/04/16 1000  oseltamivir (TAMIFLU) capsule 75 mg  Status:  Discontinued     75 mg Oral Daily 02/04/16 0815 02/04/16 0934   02/04/16 1000  oseltamivir (TAMIFLU) capsule 30 mg  Status:  Discontinued     30 mg Oral Daily 02/04/16 0934 02/04/16 1549   02/04/16 0730  sulfamethoxazole-trimethoprim (BACTRIM) 480 mg in dextrose 5 % 500 mL IVPB  Status:  Discontinued     480 mg 353.3 mL/hr over 90 Minutes Intravenous Every 24 hours 02/04/16 0721 02/04/16 1019   02/04/16 0530  cefTRIAXone (ROCEPHIN) 2 g in dextrose 5 % 50 mL IVPB  Status:  Discontinued     2 g 100 mL/hr over 30 Minutes Intravenous Every 12 hours 02/04/16 0514 02/04/16 1019   02/04/16 0300  acyclovir (ZOVIRAX) 250 mg in dextrose 5 % 100 mL IVPB  Status:  Discontinued     5 mg/kg  49.9 kg (Order-Specific) 105 mL/hr over 60 Minutes Intravenous Every 24 hours 02/04/16 0240 02/04/16 1019   02/04/16 0245  ampicillin (OMNIPEN) 2 g in sodium chloride 0.9 % 50 mL IVPB  Status:  Discontinued     2 g 150 mL/hr over 20 Minutes Intravenous Every 12 hours 02/04/16 0241 02/04/16 1019   02/04/16 0200  vancomycin (VANCOCIN) IVPB 1000 mg/200 mL premix     1,000 mg 200 mL/hr over 60 Minutes Intravenous  Once 02/04/16 0145 02/04/16 0605   02/04/16 0145  ceFEPIme (MAXIPIME) 1 g in dextrose 5 % 50 mL IVPB  Status:  Discontinued     1 g 100 mL/hr over 30 Minutes Intravenous Every 24 hours 02/04/16 0145 02/04/16 0509        Subjective: Feeling better, no acute complaint: Acute events overnight, on and off lethargy  Objective: Physical Exam: Vitals:   02/05/16 1000 02/05/16 1137 02/05/16 1410 02/05/16 1533  BP: 90/65 112/88 (!) 145/83   Pulse: 83 85 92   Resp: 15 (!) 38 (!) 38   Temp:  97.5 F (36.4  C)  97.6 F (36.4 C)  TempSrc:  Oral  Oral  SpO2: 94% 94% 97%   Weight:      Height:        Intake/Output Summary (Last 24 hours) at 02/05/16 1725 Last data filed at 02/05/16 1500  Gross per 24 hour  Intake              360 ml  Output             2001 ml  Net            -1641 ml   Filed Weights   02/04/16 1600 02/04/16 2215 02/05/16 0645  Weight: 54.6 kg (120 lb 5.9 oz) 54.5 kg (120 lb 2.4 oz) 55 kg (121 lb 4.1 oz)    General: Alert, Awake and Oriented to Time, Place and Person. Appear in moderate distress, affect appropriate Eyes: PERRL, Conjunctiva normal ENT: Oral Mucosa clear moist. Neck: difficult to assess JVD, no Abnormal Mass Or lumps Cardiovascular: S1 and S2 Present, aortic systolic Murmur, Respiratory: Bilateral Air  entry equal and Decreased, no use of accessory muscle, bilateral Crackles, no wheezes Abdomen: Bowel Sound present, Soft and no tenderness Skin: no redness, no Rash, no induration Extremities: trace Pedal edema, no calf tenderness Neurologic: Grossly no focal neuro deficit. Bilaterally Equal motor strength  Data Reviewed: CBC:  Recent Labs Lab 02/02/16 1236 02/03/16 2214 02/04/16 0526 02/04/16 2212 02/05/16 0253  WBC 7.0 6.0 3.8* 8.7 4.9  NEUTROABS  --  4.7 2.8  --  4.1  HGB 9.7* 8.9* 7.9* 8.8* 9.7*  HCT 31.2* 28.2* 25.9* 27.8* 30.4*  MCV 84.8 84.9 84.6 84.8 85.2  PLT 55* 51* 47* 36* 29*   Basic Metabolic Panel:  Recent Labs Lab 02/02/16 1236 02/03/16 2214 02/04/16 0526 02/05/16 0253  NA 148* 140 138 138  K 5.5* 4.1 4.2 3.6  CL 109 98* 102 99*  CO2 21* 29 25 28   GLUCOSE 112* 83 63* 69  BUN 78* 26* 27* 10  CREATININE 8.81* 4.07* 4.16* 2.50*  CALCIUM 9.3 8.6* 8.0* 8.6*  MG  --   --   --  1.9    Liver Function Tests:  Recent Labs Lab 02/03/16 2214 02/04/16 0526 02/05/16 0253  AST 50* 46* 65*  ALT 54 47 57*  ALKPHOS 277* 249* 250*  BILITOT 0.1* 0.4 0.6  PROT 8.4* 6.8 7.4  ALBUMIN 2.5* 1.9* 2.1*   No results for  input(s): LIPASE, AMYLASE in the last 168 hours.  Recent Labs Lab 02/04/16 0527  AMMONIA 28   Coagulation Profile:  Recent Labs Lab 02/04/16 0115  INR 1.10   Cardiac Enzymes: No results for input(s): CKTOTAL, CKMB, CKMBINDEX, TROPONINI in the last 168 hours. BNP (last 3 results) No results for input(s): PROBNP in the last 8760 hours.  CBG:  Recent Labs Lab 02/04/16 1239 02/05/16 0104 02/05/16 0745 02/05/16 1135 02/05/16 1527  GLUCAP 68 73 87 80 106*    Studies: No results found.   Scheduled Meds: . ceFEPime (MAXIPIME) IV  1 g Intravenous Q24H  . [START ON 02/07/2016] doxercalciferol  5 mcg Intravenous Q M,W,F-HD  . fluticasone  2 spray Each Nare Daily  . hydrocortisone cream   Topical BID  . vancomycin  500 mg Oral Q6H  . vancomycin  500 mg Intravenous Q M,W,F-HD   Continuous Infusions: PRN Meds: acetaminophen **OR** acetaminophen, albuterol, ondansetron **OR** ondansetron (ZOFRAN) IV  Time spent: 30 minutes  Author: Berle Mull, MD [facility] Pager: [phone] 02/05/2016 5:25 PM  If 7PM-7AM, please contact night-coverage at [email or web address], password [facility]

## 2016-02-05 NOTE — Progress Notes (Signed)
Patient passed bedside swallow study without any complications. No coughing noted while patient drank thin liquids. Patient asked for ice chips and continued to swallow without difficulty.

## 2016-02-05 NOTE — Progress Notes (Signed)
Pharmacy Miscellaneous Consult: CDiff Treatment  83 YOF with hx multiresistant drug HIV and ESRD who presented on 1/5 with AMS and unresponsive episodes. The patient was noted to have diarrhea and a CDiff panel was sent that resulted as both antigen and toxin positive. Pharmacy was consulted to aid in CDiff treatment.   Given the patient's immunocompromised state and some episodes of hypotension this admit - would recommend the higher Vancomycin po dose. The patient's antibiotic LOT should be kept at a minimum and drugs such as laxatives, PPIs, reglan, etc. should be avoided.   Plan 1. Start Vancomycin 500 mg po every 6 hours 2. Will not add Flagyl at this time - but can be considered if not improving on Vanc alone 3. Will monitor for improvement  Thank you for allowing pharmacy to be a part of this patient's care.  Alycia Rossetti, PharmD, BCPS Clinical Pharmacist Pager: [phone] 02/05/2016 5:12 PM

## 2016-02-05 NOTE — Progress Notes (Signed)
Patient transported to CT 

## 2016-02-05 NOTE — Evaluation (Signed)
Clinical/Bedside Swallow Evaluation Patient Details  Name: Paula Becker MRN: [id number] Date of Birth: 11/03/1975  Today's Date: 02/05/2016 Time: SLP Start Time (ACUTE ONLY): 0901 SLP Stop Time (ACUTE ONLY): 0914 SLP Time Calculation (min) (ACUTE ONLY): 13 min  Past Medical History:  Past Medical History:  Diagnosis Date  . Anemia of chronic disease   . ESRD on dialysis ([place])   . Herpes   . History of noncompliance with medical treatment   . HIV (human immunodeficiency virus infection) ([place])   . MRSA (methicillin resistant staph aureus) culture positive   . Necrotizing pneumonia ([place]) 07/2010  . Pneumonia 06/23/11   RLL patchy, nodular lung disease  . Renal disorder    Past Surgical History:  Past Surgical History:  Procedure Laterality Date  . BASCILIC VEIN TRANSPOSITION Left 03/16/2015   Procedure: BASCILIC VEIN TRANSPOSITION LEFT UPPER ARM;  Surgeon: Angelia Mould, MD;  Location: [place];  Service: Vascular;  Laterality: Left;  . BREAST SURGERY  03/2010   right; "don't know what they did"  . CESAREAN SECTION  1998  . INSERTION OF DIALYSIS CATHETER N/A 03/16/2015   Procedure: INSERTION OF DIALYSIS CATHETER RIGHT INTERNAL JUGULAR;  Surgeon: Angelia Mould, MD;  Location: [place];  Service: Vascular;  Laterality: N/A;  . VIDEO BRONCHOSCOPY  06/28/2011   Procedure: VIDEO BRONCHOSCOPY WITH FLUORO;  Surgeon: Chesley Mires, MD;  Location: [place];  Service: Cardiopulmonary;  Laterality: Bilateral;   HPI:  41 y.o. female with history of multiresistant drug HIV noncompliant with medication, ESRD and hemodialysis, history of herpes genitalis, chronic anemia and thrombocytopenia was brought to the ER by patient's son on 02/03/15. Patient's son found that patient has been getting increasingly confused with unresponsive episodes. Patient has had her dialysis on 02/02/15 after missing the last 2 because of weakness. In the ER patient pt minimally responsive to calling her name. On trying  to open her eyes she is trying to ressist. Patient was found to be hypothermic. Chest x-ray shows bronchiectasis and bibasal infiltrates on 02/03/15. Patient was admitted in September last year for healthcare associated pneumonia. CT head does not show anything acute except for calcification on 02/03/15. Patient is being admitted for acute encephalopathy with hypothermia. BSE ordered to r/o aspiration and determine safest diet; Nursing completed a swallowing assessment prior to BSE and did not indicate any difficulty with swallowing liquids.   Assessment / Plan / Recommendation Clinical Impression   Pt exhibited no overt s/s of aspiration, but BSE was limited d/t LOA.  Pt appears to exhibit a normal oropharyngeal swallow, but d/t decreased LOA, a clear liquid diet is recommended until pt is more alert; ST will f/u for diet upgrade if possible pending pt's mentation. Pt may need assistance with meals d/t LOA to increase po intake and assist with self-feeding.    Aspiration Risk  Mild aspiration risk    Diet Recommendation   Thin liquids (clear liquid diet)  Medication Administration: Whole meds with liquid as tolerated    Other  Recommendations Oral Care Recommendations: Oral care BID   Follow up Recommendations None      Frequency and Duration min 1 x/week  1 week       Prognosis Prognosis for Safe Diet Advancement: Good      Swallow Study   General Date of Onset: 02/03/16 HPI: 41 y.o. female with history of multiresistant drug HIV noncompliant with medication, ESRD and hemodialysis, history of herpes genitalis, chronic anemia and thrombocytopenia was brought to the ER  on 02/03/15 by patient's son. Patient's son found that patient has been getting increasingly confused with unresponsive episodes. Patient has had her dialysis yesterday after missing the last 2 because of weakness. In the ER patient was only minimally responsive to calling her name. On trying to open her eyes she was trying to  ressist. Patient was found to be hypothermic. Chest x-ray on 02/03/15 shows bronchiectasis and bibasal infiltrates not changed from previous. Patient was admitted in September last year for healthcare associated pneumonia. CT head does not show anything acute except for calcification. Patient is being admitted for acute encephalopathy with hypothermia. On my exam patient is not in respiratory distress.  Type of Study: Bedside Swallow Evaluation Previous Swallow Assessment: Record of nursing completing swallow eval on 02/04/15 without difficulty Diet Prior to this Study: Thin liquids Temperature Spikes Noted: No Respiratory Status: Room air History of Recent Intubation: No Behavior/Cognition: Cooperative;Lethargic/Drowsy;Requires cueing Oral Cavity Assessment: Within Functional Limits Oral Care Completed by SLP: No Oral Cavity - Dentition: Adequate natural dentition;Poor condition Self-Feeding Abilities: Able to feed self;Needs assist Patient Positioning: Upright in bed Baseline Vocal Quality: Low vocal intensity Volitional Cough: Weak Volitional Swallow: Able to elicit    Oral/Motor/Sensory Function Overall Oral Motor/Sensory Function: Within functional limits   Ice Chips Ice chips: Within functional limits Presentation: Spoon   Thin Liquid Thin Liquid: Within functional limits Presentation: Cup;Straw;Spoon Other Comments: Pt held initially, d/t lethargy, but no overt s/s of aspiration noted    Nectar Thick Nectar Thick Liquid: Not tested   Honey Thick Honey Thick Liquid: Not tested   Puree Other Comments: Pt refused   Solid      Solid: Not tested    Functional Assessment Tool Used: NOMS Functional Limitations: Swallowing Swallow Current Status [id number]): At least 1 percent but less than 20 percent impaired, limited or restricted Swallow Goal Status [phone]): At least 1 percent but less than 20 percent impaired, limited or restricted   Hershey Knauer,PAT, M.S, CCC-SLP 02/05/2016,9:33  AM

## 2016-02-05 NOTE — Progress Notes (Signed)
Upon assessment this am patient noted to have stage II wound on right cheek along with bilateral wounds/lesions in labia accompanied with pain and small amount of bloody drainage. MD notified, wound consult ordered, barrier cream applied. Pt states, "she has had this wounds for quite some time", but unable to get me a quantitative time frame.

## 2016-02-06 DIAGNOSIS — Z9114 Patient's other noncompliance with medication regimen: Secondary | ICD-10-CM

## 2016-02-06 LAB — GLUCOSE, CAPILLARY
GLUCOSE-CAPILLARY: 98 mg/dL (ref 65–99)
GLUCOSE-CAPILLARY: 76 mg/dL (ref 65–99)
GLUCOSE-CAPILLARY: 116 mg/dL — AB (ref 65–99)
GLUCOSE-CAPILLARY: 75 mg/dL (ref 65–99)
Glucose-Capillary: 93 mg/dL (ref 65–99)

## 2016-02-06 LAB — RENAL FUNCTION PANEL
ALBUMIN: 1.9 g/dL — AB (ref 3.5–5.0)
Anion gap: 11 (ref 5–15)
BUN: 22 mg/dL — ABNORMAL HIGH (ref 6–20)
CALCIUM: 8.5 mg/dL — AB (ref 8.9–10.3)
CO2: 26 mmol/L (ref 22–32)
CREATININE: 4.11 mg/dL — AB (ref 0.44–1.00)
Chloride: 94 mmol/L — ABNORMAL LOW (ref 101–111)
GFR, EST AFRICAN AMERICAN: 14 mL/min — AB (ref >60)
GFR, EST NON AFRICAN AMERICAN: 12 mL/min — AB (ref >60)
Glucose, Bld: 95 mg/dL (ref 65–99)
Phosphorus: 7.8 mg/dL — ABNORMAL HIGH (ref 2.5–4.6)
Potassium: 4.3 mmol/L (ref 3.5–5.1)
SODIUM: 131 mmol/L — AB (ref 135–145)

## 2016-02-06 LAB — GASTROINTESTINAL PANEL BY PCR, STOOL (REPLACES STOOL CULTURE)
ASTROVIRUS: NOT DETECTED
Adenovirus F40/41: NOT DETECTED
CAMPYLOBACTER SPECIES: NOT DETECTED
CRYPTOSPORIDIUM: NOT DETECTED
Cyclospora cayetanensis: NOT DETECTED
ENTEROPATHOGENIC E COLI (EPEC): NOT DETECTED
ENTEROTOXIGENIC E COLI (ETEC): NOT DETECTED
Entamoeba histolytica: NOT DETECTED
Enteroaggregative E coli (EAEC): NOT DETECTED
Giardia lamblia: NOT DETECTED
Norovirus GI/GII: NOT DETECTED
PLESIMONAS SHIGELLOIDES: NOT DETECTED
ROTAVIRUS A: NOT DETECTED
Salmonella species: NOT DETECTED
Sapovirus (I, II, IV, and V): NOT DETECTED
Shiga like toxin producing E coli (STEC): NOT DETECTED
Shigella/Enteroinvasive E coli (EIEC): NOT DETECTED
Vibrio cholerae: NOT DETECTED
Vibrio species: NOT DETECTED
YERSINIA ENTEROCOLITICA: NOT DETECTED

## 2016-02-06 LAB — HERPES SIMPLEX VIRUS(HSV) DNA BY PCR
HSV 1 DNA: NEGATIVE
HSV 2 DNA: NEGATIVE

## 2016-02-06 LAB — CBC
HCT: 29.7 % — ABNORMAL LOW (ref 36.0–46.0)
Hemoglobin: 9.6 g/dL — ABNORMAL LOW (ref 12.0–15.0)
MCH: 27.4 pg (ref 26.0–34.0)
MCHC: 32.3 g/dL (ref 30.0–36.0)
MCV: 84.6 fL (ref 78.0–100.0)
PLATELETS: 37 10*3/uL — AB (ref 150–400)
RBC: 3.51 MIL/uL — AB (ref 3.87–5.11)
RDW: 18.1 % — ABNORMAL HIGH (ref 11.5–15.5)
WBC: 5.5 10*3/uL (ref 4.0–10.5)

## 2016-02-06 LAB — PROCALCITONIN: PROCALCITONIN: 2.17 ng/mL

## 2016-02-06 NOTE — Progress Notes (Addendum)
Subjective: Seems improved today. She is awake enough to tell me that she has been having worsening headaches over the past 1 - 2 weeks.   Exam: Vitals:   02/06/16 0800 02/06/16 1000  BP: 130/75 (!) 150/94  Pulse: 85 95  Resp: (!) 36 (!) 33  Temp:     Gen: In bed, NAD Resp: non-labored breathing, no acute distress Abd: soft, nt  Neuro: MS: Awake, follows commands and answers simple questions, oriented to month and year.  CN: Pupils equal round and reactive, slight right hypermetria, suspect left eye that is low.  Motor: symmetric strength, but does not give good effort.  Sensory: Endorses sensation symmetrically.  Impression: 41 year old female with history of HIV and noncompliance with HIV medications who has frank aids with abnormal MRI. 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 I think treatment of this would be predominantly HAART. If this is pursued, then I think that short term steroids may be indicated as well, but if not then I think that I would not pursue it.  Recommendations: 1) Appreciate ID assistance, if HAART started, may need to consider IV steroids as well.  2) will follow  Roland Rack, MD [facility] [phone]  If 7pm- 7am, please page neurology on call as listed in [place].

## 2016-02-06 NOTE — Progress Notes (Signed)
Patient ID: Paula Becker, female   DOB: October 30, 1975, 41 y.o.   MRN: [id number]          [facility]  Date of Admission:  02/03/2016           Day 2 vancomycin        Day 2 cefepime  Principal Problem:   HCAP (healthcare-associated pneumonia) Active Problems:   Acute encephalopathy   C. difficile colitis   HIV disease ([place])   ESRD on dialysis ([place])   Anemia of chronic disease   Severe protein-calorie malnutrition (HCC)   HSV (herpes simplex virus) anogenital infection   Secondary hyperparathyroidism of renal origin ([place])   FSGS (focal segmental glomerulosclerosis)   Thrombocytopenia (HCC)   Pressure injury of skin   . ceFEPime (MAXIPIME) IV  1 g Intravenous Q24H  . [START ON 02/07/2016] doxercalciferol  5 mcg Intravenous Q M,W,F-HD  . fluticasone  2 spray Each Nare Daily  . hydrocortisone cream   Topical BID  . vancomycin  500 mg Oral Q6H  . vancomycin  500 mg Intravenous Q M,W,F-HD    SUBJECTIVE: She states that she is feeling better. She still has a cough productive of yellow sputum. She is having much less diarrhea today. She denies headache. She has not taken her HIV medications for a long time. She can only name 2 of the 4 drugs she should be taking, Kaletra and Tivicay. She is also supposed to be taking Retrovir and Viread. She tells me that the Kaletra burns her mouth. She says that she can tolerate it if she takes it with juice but she simply doesn't do this.  Review of Systems: Review of Systems  Constitutional: Positive for malaise/fatigue and weight loss. Negative for chills, diaphoresis and fever.  HENT: Negative for sore throat.   Respiratory: Positive for cough and sputum production. Negative for shortness of breath.   Cardiovascular: Negative for chest pain.  Gastrointestinal: Positive for diarrhea. Negative for abdominal pain, heartburn, nausea and vomiting.  Genitourinary: Negative for dysuria and frequency.  Musculoskeletal:  Negative for joint pain and myalgias.  Skin: Negative for rash.  Neurological: Positive for weakness. Negative for headaches.    Past Medical History:  Diagnosis Date  . Anemia of chronic disease   . ESRD on dialysis ([place])   . Herpes   . History of noncompliance with medical treatment   . HIV (human immunodeficiency virus infection) ([place])   . MRSA (methicillin resistant staph aureus) culture positive   . Necrotizing pneumonia ([place]) 07/2010  . Pneumonia 06/23/11   RLL patchy, nodular lung disease  . Renal disorder   . Renal insufficiency     Social History  Substance Use Topics  . Smoking status: Never Smoker  . Smokeless tobacco: Never Used  . Alcohol use No    Family History  Problem Relation Age of Onset  . Lung disease Mother     passed away at 68 with lung disease  . Hypertension Father    Allergies  Allergen Reactions  . Shellfish-Derived Products Swelling    Facial swelling  . Vancomycin Rash    OBJECTIVE: Vitals:   02/06/16 0741 02/06/16 0800 02/06/16 1000 02/06/16 1137  BP: (!) 142/86 130/75 (!) 150/94 (!) 140/91  Pulse: 85 85 95 91  Resp: (!) 35 (!) 36 (!) 33 (!) 29  Temp: 98 F (36.7 C)   97.6 F (36.4 C)  TempSrc: Oral   Oral  SpO2: 92% (!) 86% 96% 98%  Weight:      Height:       Body mass index is 21.08 kg/m.  Physical Exam  Constitutional: She is oriented to person, place, and time.  She is calm and in no distress.  HENT:  Mouth/Throat: No oropharyngeal exudate.  Eyes: Conjunctivae are normal.  Neck: Neck supple.  Cardiovascular: Normal rate and regular rhythm.   No murmur heard. Pulmonary/Chest: Effort normal and breath sounds normal. She has no wheezes. She has no rales.  Abdominal: Soft. There is no tenderness.  Musculoskeletal: Normal range of motion. She exhibits no edema or tenderness.  Neurological: She is alert and oriented to person, place, and time.  She is perfectly lucid.  Skin: No rash noted.  Psychiatric: Mood and affect  normal.    Lab Results Lab Results  Component Value Date   WBC 5.5 02/06/2016   HGB 9.6 (L) 02/06/2016   HCT 29.7 (L) 02/06/2016   MCV 84.6 02/06/2016   PLT 37 (L) 02/06/2016    Lab Results  Component Value Date   CREATININE 4.11 (H) 02/06/2016   BUN 22 (H) 02/06/2016   NA 131 (L) 02/06/2016   K 4.3 02/06/2016   CL 94 (L) 02/06/2016   CO2 26 02/06/2016    Lab Results  Component Value Date   ALT 57 (H) 02/05/2016   AST 65 (H) 02/05/2016   ALKPHOS 250 (H) 02/05/2016   BILITOT 0.6 02/05/2016    CSF analyses: White blood cells 8, protein 57, glucose 38, no organisms on Gram stain, cryptococcal antigen negative  Microbiology: Recent Results (from the past 240 hour(s))  Culture, blood (Routine X 2) w Reflex to ID Panel     Status: None (Preliminary result)   Collection Time: 02/03/16  9:45 PM  Result Value Ref Range Status   Specimen Description BLOOD RIGHT ARM  Final   Special Requests IN PEDIATRIC BOTTLE 5ML  Final   Culture NO GROWTH 2 DAYS  Final   Report Status PENDING  Incomplete  Culture, blood (Routine X 2) w Reflex to ID Panel     Status: None (Preliminary result)   Collection Time: 02/03/16 10:07 PM  Result Value Ref Range Status   Specimen Description BLOOD RIGHT WRIST  Final   Special Requests IN PEDIATRIC BOTTLE 1ML  Final   Culture NO GROWTH 2 DAYS  Final   Report Status PENDING  Incomplete  CSF culture     Status: None (Preliminary result)   Collection Time: 02/04/16  2:04 PM  Result Value Ref Range Status   Specimen Description CSF  Final   Special Requests NONE  Final   Gram Stain   Final    WBC PRESENT, PREDOMINANTLY MONONUCLEAR NO ORGANISMS SEEN CYTOSPIN SMEAR    Culture NO GROWTH 2 DAYS  Final   Report Status PENDING  Incomplete  MRSA PCR Screening     Status: None   Collection Time: 02/04/16 10:26 PM  Result Value Ref Range Status   MRSA by PCR NEGATIVE NEGATIVE Final    Comment:        The GeneXpert MRSA Assay (FDA approved for NASAL  specimens only), is one component of a comprehensive MRSA colonization surveillance program. It is not intended to diagnose MRSA infection nor to guide or monitor treatment for MRSA infections.   C difficile quick scan w PCR reflex     Status: Abnormal   Collection Time: 02/05/16  2:37 PM  Result Value Ref Range Status   C Diff antigen POSITIVE (  A) NEGATIVE Final   C Diff toxin POSITIVE (A) NEGATIVE Final   C Diff interpretation Toxin producing C. difficile detected.  Final    Comment: CRITICAL RESULT CALLED TO, READ BACK BY AND VERIFIED WITH: B ROCK,RN AT [id number] 02/05/16 BY L BENFIELD      ASSESSMENT:  I reviewed her records and she has been seen extensively by Kinnie Scales, our community outreach nurse, and all efforts to help her adhere have failed. I asked her what are the consequences of not taking her medications and she responded "I will die". She said that she would like to be able to see her 51 month old granddaughter grow up. I will speak with our infectious disease pharmacists tomorrow morning and we will continue to try to address barriers to adherence.  She is improving on vancomycin and cefepime for recurrent HCAP. I will give her a few more days of therapy.  She has C. difficile colitis and has been started on oral vancomycin. Other stool studies are pending.   PLAN: 1. Continue vancomycin (IV and oral) and cefepime 2. Stool for GI panel and AFB smear and cultures  Michel Bickers, MD [facility] [place] [place] [phone] pager   [phone] cell 02/06/2016, 2:00 PM

## 2016-02-06 NOTE — Progress Notes (Signed)
Patient trasfered from 4N to [phone] via bed; alert and oriented x 2, confused ; no complaints of pain; IV saline locked in RFA and RUA. Orient patient to room and unit; instructed how to use the call bell and  fall risk precautions. Will continue to monitor the patient.

## 2016-02-06 NOTE — Progress Notes (Signed)
Admit: 02/03/2016 LOS: 2  60F ESRD, AIDS, admit with AMS, hypthermia, HCAp  Subjective:  CDI, on vancomycin PO and IV Denies complaints, groggy   01/06 0701 - 01/07 0700 In: 120 [P.O.:120] Out: -   Filed Weights   02/04/16 2215 02/05/16 0645 02/06/16 0341  Weight: 54.5 kg (120 lb 2.4 oz) 55 kg (121 lb 4.1 oz) 55.7 kg (122 lb 12.7 oz)    Scheduled Meds: . ceFEPime (MAXIPIME) IV  1 g Intravenous Q24H  . [START ON 02/07/2016] doxercalciferol  5 mcg Intravenous Q M,W,F-HD  . fluticasone  2 spray Each Nare Daily  . hydrocortisone cream   Topical BID  . vancomycin  500 mg Oral Q6H  . vancomycin  500 mg Intravenous Q M,W,F-HD   Continuous Infusions: PRN Meds:.acetaminophen **OR** acetaminophen, albuterol, ondansetron **OR** ondansetron (ZOFRAN) IV  Current Labs: reviewed   Dialysis Orders: [facility]: [facility]  on MWF . EDW 51 HD [facility] 2k, 2ca  Time 4hrs Heparin 1700. Access LUA AVF     Hec 5 mcg IV/HD   Mircera 132mcg  Last on 01/26/16  q 2wks hd     Other op labs  hgb 9.5  Ca 10.1 phos 8.9  PTH 380  Physical Exam:  Blood pressure (!) 142/86, pulse 85, temperature 98 F (36.7 C), temperature source Oral, resp. rate (!) 35, height 5\' 4"  (1.626 m), weight 55.7 kg (122 lb 12.7 oz), last menstrual period 01/16/2015, SpO2 92 %. Chronically ill appearing RRR no mgr Diminished in bases LUE AVF +B/T S/nt/nd  Dialysis Orders: [facility]: [facility]  on MWF . EDW 51 HD [facility] 2k, 2ca  Time 4hrs Heparin 1700. Access LUA AVF     Hec 5 mcg IV/HD   Mircera 19mcg  Last on 01/26/16  q 2wks hd     Other op labs  hgb 9.5  Ca 10.1 phos 8.9  PTH 380  A 1. ESRD [facility] MWF using AVF 2. AMS / Hypothermia; abnl MRI; neuro and [facility] following 3. HCAP on Vanc Cefepime  4. CDI on PO Vanc 5. Anemia on ESA 6. PCM 7. 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 HD tomorrow on schedule: 3K [facility], AVF, 3L UF, TIght Heparin, 4h,    Pearson Grippe MD 02/06/2016, 8:17 AM   Recent Labs Lab 02/04/16 0526 02/05/16 0253  02/06/16 0457  NA 138 138 131*  K 4.2 3.6 4.3  CL 102 99* 94*  CO2 25 28 26   GLUCOSE 63* 69 95  BUN 27* 10 22*  CREATININE 4.16* 2.50* 4.11*  CALCIUM 8.0* 8.6* 8.5*  PHOS  --   --  7.8*    Recent Labs Lab 02/03/16 2214 02/04/16 0526 02/04/16 2212 02/05/16 0253 02/06/16 0457  WBC 6.0 3.8* 8.7 4.9 5.5  NEUTROABS 4.7 2.8  --  4.1  --   HGB 8.9* 7.9* 8.8* 9.7* 9.6*  HCT 28.2* 25.9* 27.8* 30.4* 29.7*  MCV 84.9 84.6 84.8 85.2 84.6  PLT 51* 47* 36* 29* 37*

## 2016-02-06 NOTE — Progress Notes (Signed)
Rectal tube per order- rectal tube came out. 2 RN's replaced. Patient tolerated well. Tube working well at this time.

## 2016-02-06 NOTE — Progress Notes (Signed)
[facility] Progress Note  Patient: Paula Becker [id number]   PCP: Bobby Rumpf, MD DOB: Dec 09, 1975   DOA: 02/03/2016   DOS: 02/06/2016   Date of Service: the patient was seen and examined on 02/06/2016  Brief hospital course: Pt. with PMH of ESRD on HD MWF, HIV noncompliant with medication, herpes, chronic bicytopenia; admitted on 02/03/2016, with complaint of cough and diarrhea, was found to have C. difficile colitis as well as HCAP. Nephrology consulted for continuation of hemodialysis. Neurology consulted for HIV vasculitis as well as acute restrictive disease seen on the MRI. Currently further plan is continue antibiotics.  Assessment and Plan: 1. Healthcare associated pneumonia with sepsis. Appreciate input from ID. Patient is currently on vancomycin and cefepime. We will monitor the course.  2. C. difficile colitis. Present on admission Patient had diarrhea prior to admission and had persistent diarrhea here in the hospital as well. C. difficile was positive. Pharmacy consulted and the patient started on oral vancomycin and high-dose given immunocompromise status. Rectal tube required due to significant diarrhea.  3. Acute encephalopathy with hypothermia. 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 Suspected meningitis. Meningitis currently ruled out based on the CSF picture. Encephalopathy improved significantly after improvement in hypothermia. Patient had an MRI of the brain which is showing possible acute ischemia associated with HIV vasculitis. Neurology was consulted. Does not appear to be having any acute stroke at present. Patient would still benefit from an CT scan with contrast for HIV vasculitis. Although the treatment for patient's HIV was colitis would be HAART which the patient had adamantly refused multiple times in the past. Possibility of CNS lymphoma cannot be ruled out given HIV status, follow up on cytology. EEG unremarkable. Appreciate nephrology  input.   4. ESRD on hemodialysis.  patient missed her hemodialysis treatment Monday and Wednesday. Appreciate nephrology input. Continue regular scheduled HD.  5. acute on chronic thrombocytopenia. Chronic anemia, anemia of chronic kidney disease. Patient has significant drop in her platelet count, does not appear to be TTP HUS or DIC-like picture with normal INR and a normal fibrinogen level. No schistocytes seen on the smear as well. HIT negative. This is likely appears to be acute response to sepsis. We will monitor. Patient is S/P 1 platelet transfusion for LP. No active bleeding currently reported.  6. dysphagia. Speech therapy consulted. Currently on clear liquid diet due to lethargy.  7. History of HIV. Patient with CD4 count in 20. Refused to remain compliant with HIV treatment in the past. We will currently monitor.   8. protein calorie malnutrition. Patient will be started on supplements once diet is advance.  9. stage II left buttock ulcer. Present on admission. Foam dressing. Daily change.  Bowel regimen: last BM 02/05/2016  Diet: Clear liquid diet  DVT Prophylaxis: mechanical compression device  Advance goals of care discussion: Full code  Family Communication: no family was present at bedside, at the time of interview.   Disposition:  Discharge to home. Expected discharge date: 02/09/2016, improvement in symptoms  Consultants: Nephrology, neurology, infectious disease, intervention radiology  Procedures: Hemodialysis, IR guided lumbar puncture, platelet transfusion  Antibiotics: Anti-infectives    Start     Dose/Rate Route Frequency Ordered Stop   02/05/16 1800  vancomycin (VANCOCIN) 50 mg/mL oral solution 500 mg     500 mg Oral Every 6 hours 02/05/16 1711 02/19/16 1759   02/04/16 1801  vancomycin (VANCOCIN) 500-5 MG/100ML-% IVPB    Comments:  Cherylann Banas   : cabinet override  02/04/16 1801 02/04/16 1942   02/04/16 1600  vancomycin  (VANCOCIN) 500 mg in sodium chloride 0.9 % 100 mL IVPB     500 mg 100 mL/hr over 60 Minutes Intravenous Every M-W-F (Hemodialysis) 02/04/16 1051     02/04/16 1100  ceFEPIme (MAXIPIME) 1 g in dextrose 5 % 50 mL IVPB     1 g 100 mL/hr over 30 Minutes Intravenous Every 24 hours 02/04/16 1044     02/04/16 1000  oseltamivir (TAMIFLU) capsule 75 mg  Status:  Discontinued     75 mg Oral Daily 02/04/16 0815 02/04/16 0934   02/04/16 1000  oseltamivir (TAMIFLU) capsule 30 mg  Status:  Discontinued     30 mg Oral Daily 02/04/16 0934 02/04/16 1549   02/04/16 0730  sulfamethoxazole-trimethoprim (BACTRIM) 480 mg in dextrose 5 % 500 mL IVPB  Status:  Discontinued     480 mg 353.3 mL/hr over 90 Minutes Intravenous Every 24 hours 02/04/16 0721 02/04/16 1019   02/04/16 0530  cefTRIAXone (ROCEPHIN) 2 g in dextrose 5 % 50 mL IVPB  Status:  Discontinued     2 g 100 mL/hr over 30 Minutes Intravenous Every 12 hours 02/04/16 0514 02/04/16 1019   02/04/16 0300  acyclovir (ZOVIRAX) 250 mg in dextrose 5 % 100 mL IVPB  Status:  Discontinued     5 mg/kg  49.9 kg (Order-Specific) 105 mL/hr over 60 Minutes Intravenous Every 24 hours 02/04/16 0240 02/04/16 1019   02/04/16 0245  ampicillin (OMNIPEN) 2 g in sodium chloride 0.9 % 50 mL IVPB  Status:  Discontinued     2 g 150 mL/hr over 20 Minutes Intravenous Every 12 hours 02/04/16 0241 02/04/16 1019   02/04/16 0200  vancomycin (VANCOCIN) IVPB 1000 mg/200 mL premix     1,000 mg 200 mL/hr over 60 Minutes Intravenous  Once 02/04/16 0145 02/04/16 0605   02/04/16 0145  ceFEPIme (MAXIPIME) 1 g in dextrose 5 % 50 mL IVPB  Status:  Discontinued     1 g 100 mL/hr over 30 Minutes Intravenous Every 24 hours 02/04/16 0145 02/04/16 0509      Subjective: Continues to have diarrhea here at present.  Objective: Physical Exam: Vitals:   02/06/16 0800 02/06/16 1000 02/06/16 1137 02/06/16 1500  BP: 130/75 (!) 150/94 (!) 140/91 (!) 141/85  Pulse: 85 95 91 85  Resp: (!) 36 (!) 33  (!) 29 (!) 28  Temp:   97.6 F (36.4 C) 97.5 F (36.4 C)  TempSrc:   Oral Oral  SpO2: (!) 86% 96% 98% 96%  Weight:      Height:        Intake/Output Summary (Last 24 hours) at 02/06/16 1706 Last data filed at 02/06/16 1600  Gross per 24 hour  Intake              580 ml  Output                0 ml  Net              580 ml   Filed Weights   02/04/16 2215 02/05/16 0645 02/06/16 0341  Weight: 54.5 kg (120 lb 2.4 oz) 55 kg (121 lb 4.1 oz) 55.7 kg (122 lb 12.7 oz)    General: Alert, Awake and Oriented to Time, Place and Person. Appear in moderate distress, affect appropriate Eyes: PERRL, Conjunctiva normal ENT: Oral Mucosa clear moist. Neck: difficult to assess JVD, no Abnormal Mass Or lumps Cardiovascular: S1 and S2 Present, aortic  systolic Murmur, Respiratory: Bilateral Air entry equal and Decreased, no use of accessory muscle, bilateral Crackles, no wheezes Abdomen: Bowel Sound present, Soft and no tenderness Skin: no redness, no Rash, no induration Extremities: trace Pedal edema, no calf tenderness Neurologic: Grossly no focal neuro deficit. Bilaterally Equal motor strength  Data Reviewed: CBC:  Recent Labs Lab 02/03/16 2214 02/04/16 0526 02/04/16 2212 02/05/16 0253 02/06/16 0457  WBC 6.0 3.8* 8.7 4.9 5.5  NEUTROABS 4.7 2.8  --  4.1  --   HGB 8.9* 7.9* 8.8* 9.7* 9.6*  HCT 28.2* 25.9* 27.8* 30.4* 29.7*  MCV 84.9 84.6 84.8 85.2 84.6  PLT 51* 47* 36* 29* 37*   Basic Metabolic Panel:  Recent Labs Lab 02/02/16 1236 02/03/16 2214 02/04/16 0526 02/05/16 0253 02/06/16 0457  NA 148* 140 138 138 131*  K 5.5* 4.1 4.2 3.6 4.3  CL 109 98* 102 99* 94*  CO2 21* 29 25 28 26   GLUCOSE 112* 83 63* 69 95  BUN 78* 26* 27* 10 22*  CREATININE 8.81* 4.07* 4.16* 2.50* 4.11*  CALCIUM 9.3 8.6* 8.0* 8.6* 8.5*  MG  --   --   --  1.9  --   PHOS  --   --   --   --  7.8*    Liver Function Tests:  Recent Labs Lab 02/03/16 2214 02/04/16 0526 02/05/16 0253 02/06/16 0457  AST  50* 46* 65*  --   ALT 54 47 57*  --   ALKPHOS 277* 249* 250*  --   BILITOT 0.1* 0.4 0.6  --   PROT 8.4* 6.8 7.4  --   ALBUMIN 2.5* 1.9* 2.1* 1.9*   No results for input(s): LIPASE, AMYLASE in the last 168 hours.  Recent Labs Lab 02/04/16 0527  AMMONIA 28   Coagulation Profile:  Recent Labs Lab 02/04/16 0115  INR 1.10   Cardiac Enzymes: No results for input(s): CKTOTAL, CKMB, CKMBINDEX, TROPONINI in the last 168 hours. BNP (last 3 results) No results for input(s): PROBNP in the last 8760 hours.  CBG:  Recent Labs Lab 02/05/16 1527 02/05/16 2114 02/06/16 0346 02/06/16 0738 02/06/16 1130  GLUCAP 106* 76 98 76 93    Studies: Ct Angio Head W Or Wo Contrast  Result Date: 02/06/2016 CLINICAL DATA:  41 y/o F; persistent frontal headaches, history of HIV, with concern for vasculitis. EXAM: CT ANGIOGRAPHY HEAD TECHNIQUE: Multidetector CT imaging of the head was performed using the standard protocol during bolus administration of intravenous contrast. Multiplanar CT image reconstructions and MIPs were obtained to evaluate the vascular anatomy. CONTRAST:  50 cc Isovue 370 COMPARISON:  02/04/2016 MRI of the head and CT of the head. FINDINGS: CT HEAD Brain: No evidence of acute infarction, hemorrhage, hydrocephalus, extra-axial collection or mass lesion/mass effect. Patchy hypoattenuation of white matter corresponds to T2 FLAIR signal abnormality on the prior MRI. Stable dystrophic calcification within the right frontal periventricular white matter. Vascular: As below. Skull: Normal. Negative for fracture or focal lesion. Sinuses: Normal pneumatization of mastoid air cells. Opacification of external auditory canals is likely cerumen. Moderate diffuse paranasal sinus disease greatest in the right maxillary sinus. The right globe is mildly larger than the left lobe with a diameter of 28 mm compared with 26 mm. Orbits: No acute finding. CTA HEAD Anterior circulation: No proximal occlusion,  aneurysm, or vascular malformation. There is narrowing of the bilateral a 2 segments best seen on the sagittal MIP reconstruction and there is narrowing of the prefrontal left MCA M2 branch (  series 606, image 21). Posterior circulation: No significant stenosis, proximal occlusion, aneurysm, or vascular malformation. Venous sinuses: As permitted by contrast timing, patent. Anatomic variants: Small anterior communicating artery and possible diminutive right posterior communicating artery. No left posterior communicating artery identified, likely hypoplastic or absent. A thin Delayed phase: No abnormal intracranial enhancement. IMPRESSION: 1. Mild narrowing of the bilateral A2 segments and a prefrontal left MCA M2 branch. 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 2. No large vessel occlusion, aneurysm, or vascular malformation. 3. No acute intracranial abnormality is identified. 4. Incidentally noted mild enlargement of the right globe relative to the left globe of uncertain significance, possibly a staphyloma. Clinical correlation is recommended. Electronically Signed   By: Kristine Garbe M.D.   On: 02/06/2016 00:20     Scheduled Meds: . ceFEPime (MAXIPIME) IV  1 g Intravenous Q24H  . [START ON 02/07/2016] doxercalciferol  5 mcg Intravenous Q M,W,F-HD  . fluticasone  2 spray Each Nare Daily  . hydrocortisone cream   Topical BID  . vancomycin  500 mg Oral Q6H  . vancomycin  500 mg Intravenous Q M,W,F-HD   Continuous Infusions: PRN Meds: acetaminophen **OR** acetaminophen, albuterol, ondansetron **OR** ondansetron (ZOFRAN) IV  Time spent: 30 minutes  Author: Berle Mull, MD [facility] Pager: [phone] 02/06/2016 5:06 PM  If 7PM-7AM, please contact night-coverage at [email or web address], password [facility]

## 2016-02-07 ENCOUNTER — Other Ambulatory Visit: Payer: Self-pay

## 2016-02-07 ENCOUNTER — Inpatient Hospital Stay (HOSPITAL_COMMUNITY): Payer: Medicare Other

## 2016-02-07 LAB — CSF CULTURE

## 2016-02-07 LAB — RENAL FUNCTION PANEL
ANION GAP: 13 (ref 5–15)
Albumin: 1.9 g/dL — ABNORMAL LOW (ref 3.5–5.0)
BUN: 29 mg/dL — ABNORMAL HIGH (ref 6–20)
CO2: 22 mmol/L (ref 22–32)
Calcium: 8.6 mg/dL — ABNORMAL LOW (ref 8.9–10.3)
Chloride: 89 mmol/L — ABNORMAL LOW (ref 101–111)
Creatinine, Ser: 5.09 mg/dL — ABNORMAL HIGH (ref 0.44–1.00)
GFR calc non Af Amer: 10 mL/min — ABNORMAL LOW (ref >60)
GFR, EST AFRICAN AMERICAN: 11 mL/min — AB (ref >60)
Glucose, Bld: 98 mg/dL (ref 65–99)
PHOSPHORUS: 8.8 mg/dL — AB (ref 2.5–4.6)
Potassium: 4.7 mmol/L (ref 3.5–5.1)
SODIUM: 124 mmol/L — AB (ref 135–145)

## 2016-02-07 LAB — GLUCOSE, CAPILLARY
GLUCOSE-CAPILLARY: 71 mg/dL (ref 65–99)
GLUCOSE-CAPILLARY: 98 mg/dL (ref 65–99)
Glucose-Capillary: 107 mg/dL — ABNORMAL HIGH (ref 65–99)
Glucose-Capillary: 72 mg/dL (ref 65–99)
Glucose-Capillary: 84 mg/dL (ref 65–99)

## 2016-02-07 LAB — CBC
HEMATOCRIT: 30.1 % — AB (ref 36.0–46.0)
HEMOGLOBIN: 9.7 g/dL — AB (ref 12.0–15.0)
MCH: 26.6 pg (ref 26.0–34.0)
MCHC: 32.2 g/dL (ref 30.0–36.0)
MCV: 82.7 fL (ref 78.0–100.0)
Platelets: 45 10*3/uL — ABNORMAL LOW (ref 150–400)
RBC: 3.64 MIL/uL — ABNORMAL LOW (ref 3.87–5.11)
RDW: 17.8 % — ABNORMAL HIGH (ref 11.5–15.5)
WBC: 5.6 10*3/uL (ref 4.0–10.5)

## 2016-02-07 LAB — DRUG SCREEN 10 W/CONF, SERUM
AMPHETAMINES, IA: NEGATIVE ng/mL
BARBITURATES, IA: NEGATIVE ug/mL
BENZODIAZEPINES, IA: NEGATIVE ng/mL
Cocaine & Metabolite, IA: NEGATIVE ng/mL
METHADONE, IA: NEGATIVE ng/mL
Opiates, IA: NEGATIVE ng/mL
Oxycodones, IA: NEGATIVE ng/mL
PROPOXYPHENE, IA: NEGATIVE ng/mL
Phencyclidine, IA: NEGATIVE ng/mL
THC(Marijuana) Metabolite, IA: NEGATIVE ng/mL

## 2016-02-07 LAB — ACID FAST SMEAR (AFB): ACID FAST SMEAR - AFSCU2: NEGATIVE

## 2016-02-07 LAB — CSF CULTURE W GRAM STAIN: Culture: NO GROWTH

## 2016-02-07 LAB — ACID FAST SMEAR (AFB, MYCOBACTERIA)

## 2016-02-07 LAB — VDRL, CSF: SYPHILIS VDRL QUANT CSF: NONREACTIVE

## 2016-02-07 LAB — HEPATITIS B SURFACE ANTIGEN: HEP B S AG: NEGATIVE

## 2016-02-07 MED ORDER — PRO-STAT SUGAR FREE PO LIQD
30.0000 mL | Freq: Two times a day (BID) | ORAL | Status: DC
  Administered 2016-02-07 – 2016-02-08 (×2): 30 mL via ORAL
  Filled 2016-02-07 (×2): qty 30

## 2016-02-07 MED ORDER — DOXERCALCIFEROL 4 MCG/2ML IV SOLN
INTRAVENOUS | Status: AC
  Administered 2016-02-07: 5 ug via INTRAVENOUS
  Filled 2016-02-07: qty 4

## 2016-02-07 MED ORDER — HEPARIN SODIUM (PORCINE) 1000 UNIT/ML DIALYSIS: 20.0000 [IU]/kg | INTRAMUSCULAR | Status: DC | PRN

## 2016-02-07 MED ORDER — VANCOMYCIN HCL IN DEXTROSE 500-5 MG/100ML-% IV SOLN
INTRAVENOUS | Status: AC
  Administered 2016-02-07: 500 mg
  Filled 2016-02-07: qty 100

## 2016-02-07 MED ORDER — DEXTROSE 5 % IV SOLN
2.0000 g | INTRAVENOUS | Status: DC
  Administered 2016-02-07: 2 g via INTRAVENOUS
  Filled 2016-02-07: qty 2

## 2016-02-07 MED ORDER — RENA-VITE PO TABS
1.0000 | ORAL_TABLET | Freq: Every day | ORAL | Status: DC
  Administered 2016-02-07: 1 via ORAL
  Filled 2016-02-07: qty 1

## 2016-02-07 MED ORDER — DARBEPOETIN ALFA 100 MCG/0.5ML IJ SOSY: 100.0000 ug | PREFILLED_SYRINGE | INTRAMUSCULAR | Status: DC

## 2016-02-07 NOTE — Progress Notes (Signed)
[facility] Progress Note  Dialysis Orders: Adm farm on MWF. EDW 51HD Bath 2k, 2caTime 4hrsHeparin 1700. Access LUA AVF  Hec 13mcg IV/HD Mircera 124mcg Last on 01/26/16 q 2wks hd  Other op labs hgb 9.5 Ca 10.1 phos 8.9 PTH 380  Assessment/Plan: 1. HCAP - per primary on vanc/cefepime 2. C diff+ - on vanc PO and IV  3. 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 ESRD - MWF Cont HD on schedule  5. Anemia - Hgb 9.7 Due for next ESA dose 1/10  6. Secondary hyperparathyroidism - cont VDRA/binders when diet resumes  7. HTN/volume - BP soft but using wrist cuff/No BP meds/ Pre HD wt 61.3kg 8. Nutrition/PCM - renal vita/protein supp when diet resumes  9. HIV/AIDS - not compliant with ART  10. Thrombocytopenia - acute on chronic s/p platelet transfusion  Plan - HD MWF - next 1/10 ESA with next HD,  follow weights   Lynnda Child PA-CVDRA [facility] Pager [phone] 02/07/2016,9:03 AM  LOS: 3 days   Pt seen, examined, agree w assess/plan as above with additions as indicated. Poor prognosis. Kelly Splinter MD [facility] pager [phone]    cell [phone] 02/07/2016, 9:30 AM     Subjective:   Groggy, mumbling. Seen on HD. UF goal 2.5L BP soft but using wrist cuff  K+ 4.7 use 2K bath   Objective Vitals:   02/07/16 0840 02/07/16 0845 02/07/16 0850 02/07/16 0900  BP: (!) 90/54 (!) 84/51 (!) 88/53 (!) 92/56  Pulse: 97 97 94 100  Resp: (!) 40 (!) 34 (!) 29 (!) 36  Temp:      TempSrc:      SpO2:      Weight:      Height:       Physical Exam General: Frail, chronically-ill appearing female NAD Heart: RRR  Lungs: diminished bilat Abdomen: soft ND Extremities: No LE eedema Dialysis Access: LUE AVF cannunlated on HD   Additional Objective Labs: Basic Metabolic Panel:  Recent Labs Lab 02/05/16 0253 02/06/16 0457 02/07/16 0630  NA 138 131* 124*  K 3.6 4.3 4.7  CL 99* 94*  89*  CO2 28 26 22   GLUCOSE 69 95 98  BUN 10 22* 29*  CREATININE 2.50* 4.11* 5.09*  CALCIUM 8.6* 8.5* 8.6*  PHOS  --  7.8* 8.8*   Liver Function Tests:  Recent Labs Lab 02/03/16 2214 02/04/16 0526 02/05/16 0253 02/06/16 0457 02/07/16 0630  AST 50* 46* 65*  --   --   ALT 54 47 57*  --   --   ALKPHOS 277* 249* 250*  --   --   BILITOT 0.1* 0.4 0.6  --   --   PROT 8.4* 6.8 7.4  --   --   ALBUMIN 2.5* 1.9* 2.1* 1.9* 1.9*   No results for input(s): LIPASE, AMYLASE in the last 168 hours. CBC:  Recent Labs Lab 02/03/16 2214 02/04/16 0526 02/04/16 2212 02/05/16 0253 02/06/16 0457 02/07/16 0704  WBC 6.0 3.8* 8.7 4.9 5.5 5.6  NEUTROABS 4.7 2.8  --  4.1  --   --   HGB 8.9* 7.9* 8.8* 9.7* 9.6* 9.7*  HCT 28.2* 25.9* 27.8* 30.4* 29.7* 30.1*  MCV 84.9 84.6 84.8 85.2 84.6 82.7  PLT 51* 47* 36* 29* 37* 45*   Blood Culture    Component Value Date/Time   SDES CSF 02/04/2016 1404   SPECREQUEST NONE 02/04/2016 1404   CULT NO GROWTH 2  DAYS 02/04/2016 1404   REPTSTATUS PENDING 02/04/2016 1404    Cardiac Enzymes: No results for input(s): CKTOTAL, CKMB, CKMBINDEX, TROPONINI in the last 168 hours. CBG:  Recent Labs Lab 02/06/16 1130 02/06/16 1838 02/06/16 2303 02/07/16 0106 02/07/16 0633  GLUCAP 93 116* 75 107* 71   Iron Studies: No results for input(s): IRON, TIBC, TRANSFERRIN, FERRITIN in the last 72 hours. Lab Results  Component Value Date   INR 1.10 02/04/2016   INR 1.37 03/11/2015   Medications:  . ceFEPime (MAXIPIME) IV  2 g Intravenous Q M,W,F-2000  . doxercalciferol  5 mcg Intravenous Q M,W,F-HD  . fluticasone  2 spray Each Nare Daily  . hydrocortisone cream   Topical BID  . vancomycin  500 mg Oral Q6H  . vancomycin  500 mg Intravenous Q M,W,F-HD  . vancomycin

## 2016-02-07 NOTE — Progress Notes (Signed)
Pharmacy Antibiotic Note  Paula Becker is a 41 y.o. female admitted on 02/03/2016 with pneumonia.  Pharmacy has been consulted for Vancomycin and Cefepime dosing for HIV related HCAP.   ESRD on HD- last HD session 1/5, received a total of vancomycin 1500mg  after that session. Currently in HD. Noted allergy of rash to vancomycin, but has been tolerating- no noted reaction at this time.  Plan: Vancomycin 500mg  IV qHD- MWF Cefepime 2g IV qMWF @ 2000 Vancomycin 500mg  PO q6h Trend clinical progression, c/s, ID plans, HD schedule/tolerance Drug levels as indicated   Temp (24hrs), Avg:97.7 F (36.5 C), Min:97.5 F (36.4 C), Max:98.2 F (36.8 C)   Recent Labs Lab 02/03/16 2201 02/03/16 2214 02/04/16 0131 02/04/16 0526 02/04/16 2212 02/05/16 0253 02/06/16 0457 02/07/16 0630 02/07/16 0704  WBC  --  6.0  --  3.8* 8.7 4.9 5.5  --  5.6  CREATININE  --  4.07*  --  4.16*  --  2.50* 4.11* 5.09*  --   LATICACIDVEN 1.46  --  1.03  --   --   --   --   --   --     Estimated Creatinine Clearance: 12.6 mL/min (by C-G formula based on SCr of 5.09 mg/dL (H)).    Allergies  Allergen Reactions  . Shellfish-Derived Products Swelling    Facial swelling  . Vancomycin Rash   Antimicrobials this admission:  Cefepime 1/5 >>  Vancomycin 1/5 >>  Vanc PO 1/6 >  Dose adjustments this admission:  1/8: cefepime changed to 2g qHD days (from 1g q24)  Microbiology results:  1/4 BCx: ngtd 1/5 MRSA PCR: neg 1/6 CDiff antigen/toxin: pos 1/6 GI panel: neg 1/6 ova/parasite: 1/6 AFB stool: sent  1/5 CSF: ngtd 1/5 CSF fungal: sent  Paula Becker, PharmD, BCPS Clinical Pharmacist Pager: [phone] 02/07/2016 8:59 AM

## 2016-02-07 NOTE — Patient Outreach (Signed)
[facility]) Care Management  02/07/2016  Paula Becker [birth year] [id number]   Patient hospitalized.  Hospital liaison made aware of admit in order to engage patient.    Jone Baseman, RN, MSN [place] [phone]

## 2016-02-07 NOTE — Progress Notes (Signed)
SLP Cancellation Note  Patient Details Name: Whittney Marett MRN: [id number] DOB: 12/03/1975   Cancelled treatment:        Pt in procedure.                                                                                                 Nakhia Levitan, Katherene Ponto 02/07/2016, 8:12 AM

## 2016-02-07 NOTE — Consult Note (Addendum)
[facility] Nurse wound consult note Reason for Consult: Consult requested for buttocks and labia.  Pt has multiple wounds to bilat buttocks and perineum and has diagnosis of HIV. Wound type: There are several pale-colored nodules which extend above the skin level near the rectum.  There are partial thickness lesions to the inner groin and labia which are pink and moist.  There are dry scabbed wounds to upper buttocks; largest is 4X5X.1cm, dark red wound bed surrounded by darker-colored dry black scabbed wound edges.  There are multiple areas of darker-colored scar tissue from other previous wounds which have healed to bilat buttocks.  Gluteal fold is moist and macerated.   Pressure Injury POA: These were present on admission but are NOT pressure injuries.  Appearance and locations are consistent with perirectal lesions which are a common side effect of HIV.  Topical treatment is minimally effective to promote wound healing.  Recommend follow up by the ID team.  Pt is not very awake and there are no family members present to discuss the plan of care. Dressing procedure/placement/frequency: Pt has a Flexiseal to attempt to contain stool. Barrier cream to protect and repel moisture. Foam dressing to upper buttocks to promote healing to open wound. Please re-consult if further assistance is needed.  Thank-you,  Julien Girt MSN, [place], [place], [place], [place]

## 2016-02-07 NOTE — Progress Notes (Signed)
[facility] for Infectious Disease  Date of Admission:  02/03/2016    Total days of antibiotics 6        Day 4 IV vancomycin and cefepime        Day 2 oral vancomycin         Principal Problem:   HCAP (healthcare-associated pneumonia) Active Problems:   Acute encephalopathy   C. difficile colitis   HIV disease ([place])   ESRD on dialysis ([place])   Anemia of chronic disease   Severe protein-calorie malnutrition ([place])   HSV (herpes simplex virus) anogenital infection   Secondary hyperparathyroidism of renal origin ([place])   FSGS (focal segmental glomerulosclerosis)   Thrombocytopenia ([facility])   Pressure injury of skin   . ceFEPime (MAXIPIME) IV  2 g Intravenous Q M,W,F-2000  . [START ON 02/09/2016] darbepoetin (ARANESP) injection - DIALYSIS  100 mcg Intravenous Q Wed-HD  . doxercalciferol  5 mcg Intravenous Q M,W,F-HD  . feeding supplement (PRO-STAT SUGAR FREE 64)  30 mL Oral BID  . fluticasone  2 spray Each Nare Daily  . hydrocortisone cream   Topical BID  . multivitamin  1 tablet Oral QHS  . vancomycin  500 mg Oral Q6H  . vancomycin  500 mg Intravenous Q M,W,F-HD   Review of Systems: Review of Systems  Unable to perform ROS: Mental acuity    Past Medical History:  Diagnosis Date  . Anemia of chronic disease   . ESRD on dialysis ([place])   . Herpes   . History of noncompliance with medical treatment   . HIV (human immunodeficiency virus infection) ([place])   . MRSA (methicillin resistant staph aureus) culture positive   . Necrotizing pneumonia ([place]) 07/2010  . Pneumonia 06/23/11   RLL patchy, nodular lung disease  . Renal disorder   . Renal insufficiency     Social History  Substance Use Topics  . Smoking status: Never Smoker  . Smokeless tobacco: Never Used  . Alcohol use No    Family History  Problem Relation Age of Onset  . Lung disease Mother     passed away at 59 with lung disease  . Hypertension Father    Allergies  Allergen Reactions  .  Shellfish-Derived Products Swelling    Facial swelling    OBJECTIVE: Vitals:   02/07/16 1000 02/07/16 1005 02/07/16 1030 02/07/16 1100  BP: (!) 95/51 (!) 100/43 (!) 106/52 (!) 106/50  Pulse: (!) 101 100 (!) 104 99  Resp: (!) 37 (!) 35 (!) 39 (!) 22  Temp:    97.7 F (36.5 C)  TempSrc:    Oral  SpO2:    100%  Weight:    130 lb 11.7 oz (59.3 kg)  Height:       Body mass index is 22.44 kg/m.  Physical Exam  Constitutional:  She is currently on hemodialysis. She is very lethargic and does not respond to questions.  Cardiovascular: Normal rate and regular rhythm.   No murmur heard. Pulmonary/Chest: Breath sounds normal.  Shallow respirations. Normal breath sounds anteriorly.  Abdominal: Soft.  Skin: No rash noted.    Lab Results Lab Results  Component Value Date   WBC 5.6 02/07/2016   HGB 9.7 (L) 02/07/2016   HCT 30.1 (L) 02/07/2016   MCV 82.7 02/07/2016   PLT 45 (L) 02/07/2016    Lab Results  Component Value Date   CREATININE 5.09 (H) 02/07/2016   BUN 29 (H) 02/07/2016   NA 124 (  L) 02/07/2016   K 4.7 02/07/2016   CL 89 (L) 02/07/2016   CO2 22 02/07/2016    Lab Results  Component Value Date   ALT 57 (H) 02/05/2016   AST 65 (H) 02/05/2016   ALKPHOS 250 (H) 02/05/2016   BILITOT 0.6 02/05/2016     Microbiology: Recent Results (from the past 240 hour(s))  Culture, blood (Routine X 2) w Reflex to ID Panel     Status: None (Preliminary result)   Collection Time: 02/03/16  9:45 PM  Result Value Ref Range Status   Specimen Description BLOOD RIGHT ARM  Final   Special Requests IN PEDIATRIC BOTTLE 5ML  Final   Culture NO GROWTH 3 DAYS  Final   Report Status PENDING  Incomplete  Culture, blood (Routine X 2) w Reflex to ID Panel     Status: None (Preliminary result)   Collection Time: 02/03/16 10:07 PM  Result Value Ref Range Status   Specimen Description BLOOD RIGHT WRIST  Final   Special Requests IN PEDIATRIC BOTTLE 1ML  Final   Culture NO GROWTH 3 DAYS  Final     Report Status PENDING  Incomplete  CSF culture     Status: None   Collection Time: 02/04/16  2:04 PM  Result Value Ref Range Status   Specimen Description CSF  Final   Special Requests NONE  Final   Gram Stain   Final    WBC PRESENT, PREDOMINANTLY MONONUCLEAR NO ORGANISMS SEEN CYTOSPIN SMEAR    Culture NO GROWTH 3 DAYS  Final   Report Status 02/07/2016 FINAL  Final  Fungus Culture With Stain     Status: None (Preliminary result)   Collection Time: 02/04/16  2:04 PM  Result Value Ref Range Status   Fungus Stain Final report  Final    Comment: (NOTE) Performed At: [facility] [address] [place], [place] [id number] Lindon Romp MD [id number]    Fungus (Mycology) Culture PENDING  Incomplete   Fungal Source CSF  Final  Fungus Culture Result     Status: None   Collection Time: 02/04/16  2:04 PM  Result Value Ref Range Status   Result 1 Comment  Final    Comment: (NOTE) KOH/Calcofluor preparation:  no fungus observed. Performed At: [facility] [place], [place] [id number] Lindon Romp MD [id number]   MRSA PCR Screening     Status: None   Collection Time: 02/04/16 10:26 PM  Result Value Ref Range Status   MRSA by PCR NEGATIVE NEGATIVE Final    Comment:        The GeneXpert MRSA Assay (FDA approved for NASAL specimens only), is one component of a comprehensive MRSA colonization surveillance program. It is not intended to diagnose MRSA infection nor to guide or monitor treatment for MRSA infections.   C difficile quick scan w PCR reflex     Status: Abnormal   Collection Time: 02/05/16  2:37 PM  Result Value Ref Range Status   C Diff antigen POSITIVE (A) NEGATIVE Final   C Diff toxin POSITIVE (A) NEGATIVE Final   C Diff interpretation Toxin producing C. difficile detected.  Final    Comment: CRITICAL RESULT CALLED TO, READ BACK BY AND VERIFIED WITH: B ROCK,RN AT [id number] 02/05/16 BY L BENFIELD   Gastrointestinal Panel by  PCR , Stool     Status: None   Collection Time: 02/05/16  4:47 PM  Result Value Ref Range Status   Campylobacter species NOT DETECTED NOT  DETECTED Final   Plesimonas shigelloides NOT DETECTED NOT DETECTED Final   Salmonella species NOT DETECTED NOT DETECTED Final   Yersinia enterocolitica NOT DETECTED NOT DETECTED Final   Vibrio species NOT DETECTED NOT DETECTED Final   Vibrio cholerae NOT DETECTED NOT DETECTED Final   Enteroaggregative E coli (EAEC) NOT DETECTED NOT DETECTED Final   Enteropathogenic E coli (EPEC) NOT DETECTED NOT DETECTED Final   Enterotoxigenic E coli (ETEC) NOT DETECTED NOT DETECTED Final   Shiga like toxin producing E coli (STEC) NOT DETECTED NOT DETECTED Final   Shigella/Enteroinvasive E coli (EIEC) NOT DETECTED NOT DETECTED Final   Cryptosporidium NOT DETECTED NOT DETECTED Final   Cyclospora cayetanensis NOT DETECTED NOT DETECTED Final   Entamoeba histolytica NOT DETECTED NOT DETECTED Final   Giardia lamblia NOT DETECTED NOT DETECTED Final   Adenovirus F40/41 NOT DETECTED NOT DETECTED Final   Astrovirus NOT DETECTED NOT DETECTED Final   Norovirus GI/GII NOT DETECTED NOT DETECTED Final   Rotavirus A NOT DETECTED NOT DETECTED Final   Sapovirus (I, II, IV, and V) NOT DETECTED NOT DETECTED Final     ASSESSMENT: She has been afebrile/normothermic since admission. I will plan on stopping IV vancomycin and cefepime tomorrow to lessen the impact on her gut flora and hopefully make it easier to treat her C. difficile diarrhea. I have asked her infectious disease pharmacist, Onnie Boer, to talk with her when she is more alert about her antiretroviral regimen. She has requested liquid antiretroviral medications when possible. She tells me that he liquid Kaletra burns her mouth and that is why she does not take her medications. However, she told me that when she takes it with juice she does not have that problem but still does not take her medications. She has a very long history  of poor adherence and I cannot find evidence that she has ever taken medication consistently and correctly. She does have resistance mutations.  PLAN: 1. Continue IV vancomycin and cefepime 1 more day 2. Continue oral vancomycin 3. Will consider restarting antiretroviral medications after assessed by pharmacy  Michel Bickers, MD [facility] for [place] [phone] pager   [phone] cell 02/07/2016, 12:03 PM

## 2016-02-07 NOTE — Consult Note (Signed)
   [facility] CM Inpatient Consult   02/07/2016  Paula Becker 04-24-1975 [id number]   Notified that the patient is active with [facility] this morning, patient was in Hemodialysis. Patient has been active with [facility]. Patient has returned to the unit but is resting at this time. Chart reviewed for ongoing needs.  Patient admitted with HCAP, Hypothermia with HX of HIV.  Inpatient RNCM notified of patient's relationship with [facility] Care Management team.  Inpatient states the patient has been lethargic today.  Will follow up for discharge planning needs as appropriate.  Will continue to follow up for discharge planning needs and assess for [facility] community care management follow up.  For questions, please contact:  Natividad Brood, RN BSN [facility] Liaison  [phone] business mobile phone Toll free office [phone]

## 2016-02-07 NOTE — Progress Notes (Signed)
[facility] Progress Note  Patient: Paula Becker [id number]   PCP: Bobby Rumpf, MD DOB: August 08, 1975   DOA: 02/03/2016   DOS: 02/07/2016   Date of Service: the patient was seen and examined on 02/07/2016  Brief hospital course: Pt. with PMH of ESRD on HD MWF, HIV noncompliant with medication, herpes, chronic bicytopenia; admitted on 02/03/2016, with complaint of cough and diarrhea, was found to have C. difficile colitis as well as HCAP. Nephrology consulted for continuation of hemodialysis. Neurology consulted for HIV vasculitis as well as acute restrictive disease seen on the MRI. Currently further plan is continue antibiotics.  Assessment and Plan: 1. Healthcare associated pneumonia with sepsis. Appreciate input from ID. Patient is currently on vancomycin and cefepime. Last day tomorrow per ID. We will monitor the course.  2. C. difficile colitis. Present on admission Patient had diarrhea prior to admission and had persistent diarrhea here in the hospital as well. C. difficile was positive. Pharmacy consulted and the patient started on oral vancomycin and high-dose given immunocompromise status. Rectal tube required due to significant diarrhea.  3. Acute encephalopathy with hypothermia. 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 Suspected meningitis. Meningitis currently ruled out based on the CSF picture. Encephalopathy improved significantly after improvement in hypothermia. Patient had an MRI of the brain which is showing possible acute ischemia associated with HIV vasculitis. Neurology was consulted. Does not appear to be having any acute stroke at present. Patient would still benefit from an CT scan with contrast for HIV vasculitis. Although the treatment for patient's HIV was colitis would be HAART which the patient had adamantly refused multiple times in the past. Possibility of CNS lymphoma cannot be ruled out given HIV status, follow up on cytology. EEG  unremarkable. Appreciate nephrology input.   4. ESRD on hemodialysis.  patient missed her hemodialysis treatment Monday and Wednesday. Appreciate nephrology input. Continue regular scheduled HD.  5. acute on chronic thrombocytopenia. Chronic anemia, anemia of chronic kidney disease. Patient has significant drop in her platelet count, does not appear to be TTP HUS or DIC-like picture with normal INR and a normal fibrinogen level. No schistocytes seen on the smear as well. HIT negative. This is likely appears to be acute response to sepsis. Patient is S/P 1 platelet transfusion for LP. No active bleeding currently reported. Getting better at present.   6. dysphagia. Speech therapy consulted. Currently on clear liquid diet due to lethargy.  7. History of HIV. Patient with CD4 count in 20. Refused to remain compliant with HIV treatment in the past. We will currently monitor.  At present ID is considering option for pt with outpatient pharmacist, if the pt continues to refuse HIV treatment then we may initiate discussion regarding goals of care and possible hospice.  8. protein calorie malnutrition. Patient will be started on supplements once diet is advance.  9. stage II left buttock ulcer. Present on admission. Foam dressing. Daily change.  Bowel regimen: last BM 02/05/2016  Diet: Clear liquid diet  DVT Prophylaxis: mechanical compression device  Advance goals of care discussion: Full code  Family Communication: no family was present at bedside, at the time of interview.   Disposition:  Discharge to home. Expected discharge date: 02/09/2016, improvement in symptoms  Consultants: Nephrology, neurology, infectious disease, intervention radiology  Procedures: Hemodialysis, IR guided lumbar puncture, platelet transfusion  Antibiotics: Anti-infectives    Start     Dose/Rate Route Frequency Ordered Stop   02/07/16 2000  ceFEPIme (MAXIPIME) 2 g in dextrose 5 % 50 mL  IVPB      2 g 100 mL/hr over 30 Minutes Intravenous Every M-W-F (2000) 02/07/16 0851     02/07/16 0852  vancomycin (VANCOCIN) 500-5 MG/100ML-% IVPB    Comments:  Celesta Gentile   : cabinet override      02/07/16 0852 02/07/16 0946   02/05/16 1800  vancomycin (VANCOCIN) 50 mg/mL oral solution 500 mg     500 mg Oral Every 6 hours 02/05/16 1711 02/19/16 1759   02/04/16 1801  vancomycin (VANCOCIN) 500-5 MG/100ML-% IVPB    Comments:  Cherylann Banas   : cabinet override      02/04/16 1801 02/04/16 1942   02/04/16 1600  vancomycin (VANCOCIN) 500 mg in sodium chloride 0.9 % 100 mL IVPB     500 mg 100 mL/hr over 60 Minutes Intravenous Every M-W-F (Hemodialysis) 02/04/16 1051     02/04/16 1100  ceFEPIme (MAXIPIME) 1 g in dextrose 5 % 50 mL IVPB  Status:  Discontinued     1 g 100 mL/hr over 30 Minutes Intravenous Every 24 hours 02/04/16 1044 02/07/16 0851   02/04/16 1000  oseltamivir (TAMIFLU) capsule 75 mg  Status:  Discontinued     75 mg Oral Daily 02/04/16 0815 02/04/16 0934   02/04/16 1000  oseltamivir (TAMIFLU) capsule 30 mg  Status:  Discontinued     30 mg Oral Daily 02/04/16 0934 02/04/16 1549   02/04/16 0730  sulfamethoxazole-trimethoprim (BACTRIM) 480 mg in dextrose 5 % 500 mL IVPB  Status:  Discontinued     480 mg 353.3 mL/hr over 90 Minutes Intravenous Every 24 hours 02/04/16 0721 02/04/16 1019   02/04/16 0530  cefTRIAXone (ROCEPHIN) 2 g in dextrose 5 % 50 mL IVPB  Status:  Discontinued     2 g 100 mL/hr over 30 Minutes Intravenous Every 12 hours 02/04/16 0514 02/04/16 1019   02/04/16 0300  acyclovir (ZOVIRAX) 250 mg in dextrose 5 % 100 mL IVPB  Status:  Discontinued     5 mg/kg  49.9 kg (Order-Specific) 105 mL/hr over 60 Minutes Intravenous Every 24 hours 02/04/16 0240 02/04/16 1019   02/04/16 0245  ampicillin (OMNIPEN) 2 g in sodium chloride 0.9 % 50 mL IVPB  Status:  Discontinued     2 g 150 mL/hr over 20 Minutes Intravenous Every 12 hours 02/04/16 0241 02/04/16 1019   02/04/16 0200   vancomycin (VANCOCIN) IVPB 1000 mg/200 mL premix     1,000 mg 200 mL/hr over 60 Minutes Intravenous  Once 02/04/16 0145 02/04/16 0605   02/04/16 0145  ceFEPIme (MAXIPIME) 1 g in dextrose 5 % 50 mL IVPB  Status:  Discontinued     1 g 100 mL/hr over 30 Minutes Intravenous Every 24 hours 02/04/16 0145 02/04/16 0509      Subjective: no acute complains at present.   Objective: Physical Exam: Vitals:   02/07/16 1000 02/07/16 1005 02/07/16 1030 02/07/16 1100  BP: (!) 95/51 (!) 100/43 (!) 106/52 (!) 106/50  Pulse: (!) 101 100 (!) 104 99  Resp: (!) 37 (!) 35 (!) 39 (!) 22  Temp:    97.7 F (36.5 C)  TempSrc:    Oral  SpO2:    100%  Weight:    59.3 kg (130 lb 11.7 oz)  Height:        Intake/Output Summary (Last 24 hours) at 02/07/16 1739 Last data filed at 02/07/16 1100  Gross per 24 hour  Intake               60  ml  Output             1100 ml  Net            -1040 ml   Filed Weights   02/07/16 0449 02/07/16 0645 02/07/16 1100  Weight: 56.1 kg (123 lb 10.9 oz) 61.3 kg (135 lb 2.3 oz) 59.3 kg (130 lb 11.7 oz)    General: Alert, Awake and Oriented to Person. Appear in moderate distress, affect appropriate Eyes: PERRL, Conjunctiva normal ENT: Oral Mucosa clear moist. Neck: difficult to assess JVD, no Abnormal Mass Or lumps Cardiovascular: S1 and S2 Present, aortic systolic Murmur, Respiratory: Bilateral Air entry equal and Decreased, no use of accessory muscle, bilateral Crackles, no wheezes Abdomen: Bowel Sound present, Soft and no tenderness Skin: no redness, no Rash, no induration Extremities: trace Pedal edema, no calf tenderness Neurologic: Grossly no focal neuro deficit. Bilaterally Equal motor strength  Data Reviewed: CBC:  Recent Labs Lab 02/03/16 2214 02/04/16 0526 02/04/16 2212 02/05/16 0253 02/06/16 0457 02/07/16 0704  WBC 6.0 3.8* 8.7 4.9 5.5 5.6  NEUTROABS 4.7 2.8  --  4.1  --   --   HGB 8.9* 7.9* 8.8* 9.7* 9.6* 9.7*  HCT 28.2* 25.9* 27.8* 30.4* 29.7*  30.1*  MCV 84.9 84.6 84.8 85.2 84.6 82.7  PLT 51* 47* 36* 29* 37* 45*   Basic Metabolic Panel:  Recent Labs Lab 02/03/16 2214 02/04/16 0526 02/05/16 0253 02/06/16 0457 02/07/16 0630  NA 140 138 138 131* 124*  K 4.1 4.2 3.6 4.3 4.7  CL 98* 102 99* 94* 89*  CO2 29 25 28 26 22   GLUCOSE 83 63* 69 95 98  BUN 26* 27* 10 22* 29*  CREATININE 4.07* 4.16* 2.50* 4.11* 5.09*  CALCIUM 8.6* 8.0* 8.6* 8.5* 8.6*  MG  --   --  1.9  --   --   PHOS  --   --   --  7.8* 8.8*    Liver Function Tests:  Recent Labs Lab 02/03/16 2214 02/04/16 0526 02/05/16 0253 02/06/16 0457 02/07/16 0630  AST 50* 46* 65*  --   --   ALT 54 47 57*  --   --   ALKPHOS 277* 249* 250*  --   --   BILITOT 0.1* 0.4 0.6  --   --   PROT 8.4* 6.8 7.4  --   --   ALBUMIN 2.5* 1.9* 2.1* 1.9* 1.9*   No results for input(s): LIPASE, AMYLASE in the last 168 hours.  Recent Labs Lab 02/04/16 0527  AMMONIA 28   Coagulation Profile:  Recent Labs Lab 02/04/16 0115  INR 1.10   Cardiac Enzymes: No results for input(s): CKTOTAL, CKMB, CKMBINDEX, TROPONINI in the last 168 hours. BNP (last 3 results) No results for input(s): PROBNP in the last 8760 hours.  CBG:  Recent Labs Lab 02/06/16 1838 02/06/16 2303 02/07/16 0106 02/07/16 0633 02/07/16 1707  GLUCAP 116* 75 107* 71 72    Studies: Dg Chest Port 1 View  Result Date: 02/07/2016 CLINICAL DATA:  Status post dialysis dialysis. Shortness of breath. History of HIV, previous episodes of pneumonia EXAM: PORTABLE CHEST 1 VIEW COMPARISON:  Chest x-ray of February 03, 2016 FINDINGS: The pulmonary interstitial markings are increased bilaterally. There is a large right pleural effusion and smaller left pleural effusion layering posteriorly. The right heart border is obscured. The cardiac silhouette is normal in size. The trachea is midline. IMPRESSION: Pulmonary edema and bilateral pleural effusions consistent with CHF. One cannot exclude underlying  pneumonia in the  appropriate setting. Electronically Signed   By: David  Martinique M.D.   On: 02/07/2016 13:34     Scheduled Meds: . ceFEPime (MAXIPIME) IV  2 g Intravenous Q M,W,F-2000  . [START ON 02/09/2016] darbepoetin (ARANESP) injection - DIALYSIS  100 mcg Intravenous Q Wed-HD  . doxercalciferol  5 mcg Intravenous Q M,W,F-HD  . feeding supplement (PRO-STAT SUGAR FREE 64)  30 mL Oral BID  . fluticasone  2 spray Each Nare Daily  . hydrocortisone cream   Topical BID  . multivitamin  1 tablet Oral QHS  . vancomycin  500 mg Oral Q6H  . vancomycin  500 mg Intravenous Q M,W,F-HD   Continuous Infusions: PRN Meds: acetaminophen **OR** acetaminophen, albuterol, ondansetron **OR** ondansetron (ZOFRAN) IV  Time spent: 30 minutes  Author: Berle Mull, MD [facility] Pager: [phone] 02/07/2016 5:39 PM  If 7PM-7AM, please contact night-coverage at [email or web address], password [facility]

## 2016-02-08 DIAGNOSIS — Z7189 Other specified counseling: Secondary | ICD-10-CM

## 2016-02-08 DIAGNOSIS — Z515 Encounter for palliative care: Secondary | ICD-10-CM

## 2016-02-08 DIAGNOSIS — A0472 Enterocolitis due to Clostridium difficile, not specified as recurrent: Secondary | ICD-10-CM

## 2016-02-08 LAB — RENAL FUNCTION PANEL
Albumin: 1.7 g/dL — ABNORMAL LOW (ref 3.5–5.0)
Anion gap: 9 (ref 5–15)
BUN: 12 mg/dL (ref 6–20)
CHLORIDE: 94 mmol/L — AB (ref 101–111)
CO2: 25 mmol/L (ref 22–32)
CREATININE: 3.25 mg/dL — AB (ref 0.44–1.00)
Calcium: 8.4 mg/dL — ABNORMAL LOW (ref 8.9–10.3)
GFR calc Af Amer: 19 mL/min — ABNORMAL LOW (ref >60)
GFR calc non Af Amer: 17 mL/min — ABNORMAL LOW (ref >60)
Glucose, Bld: 140 mg/dL — ABNORMAL HIGH (ref 65–99)
Phosphorus: 5.5 mg/dL — ABNORMAL HIGH (ref 2.5–4.6)
Potassium: 3.5 mmol/L (ref 3.5–5.1)
Sodium: 128 mmol/L — ABNORMAL LOW (ref 135–145)

## 2016-02-08 LAB — CBC
HCT: 28.8 % — ABNORMAL LOW (ref 36.0–46.0)
Hemoglobin: 9.1 g/dL — ABNORMAL LOW (ref 12.0–15.0)
MCH: 26.5 pg (ref 26.0–34.0)
MCHC: 31.6 g/dL (ref 30.0–36.0)
MCV: 83.7 fL (ref 78.0–100.0)
PLATELETS: 38 10*3/uL — AB (ref 150–400)
RBC: 3.44 MIL/uL — ABNORMAL LOW (ref 3.87–5.11)
RDW: 18.1 % — AB (ref 11.5–15.5)
WBC: 5.1 10*3/uL (ref 4.0–10.5)

## 2016-02-08 LAB — CULTURE, BLOOD (ROUTINE X 2)
CULTURE: NO GROWTH
CULTURE: NO GROWTH

## 2016-02-08 LAB — GLUCOSE, CAPILLARY
Glucose-Capillary: 106 mg/dL — ABNORMAL HIGH (ref 65–99)
Glucose-Capillary: 106 mg/dL — ABNORMAL HIGH (ref 65–99)
Glucose-Capillary: 119 mg/dL — ABNORMAL HIGH (ref 65–99)
Glucose-Capillary: 136 mg/dL — ABNORMAL HIGH (ref 65–99)
Glucose-Capillary: 68 mg/dL (ref 65–99)
Glucose-Capillary: 71 mg/dL (ref 65–99)
Glucose-Capillary: 98 mg/dL (ref 65–99)

## 2016-02-08 LAB — PROCALCITONIN: Procalcitonin: 2.53 ng/mL

## 2016-02-08 LAB — OVA + PARASITE EXAM

## 2016-02-08 LAB — O&P RESULT

## 2016-02-08 MED ORDER — ONDANSETRON HCL 4 MG/2ML IJ SOLN: 4.0000 mg | Freq: Four times a day (QID) | INTRAMUSCULAR | Status: DC | PRN

## 2016-02-08 MED ORDER — IPRATROPIUM-ALBUTEROL 0.5-2.5 (3) MG/3ML IN SOLN
3.0000 mL | Freq: Four times a day (QID) | RESPIRATORY_TRACT | Status: DC
  Administered 2016-02-08: 3 mL via RESPIRATORY_TRACT
  Filled 2016-02-08 (×2): qty 3

## 2016-02-08 MED ORDER — SODIUM CHLORIDE 0.9% FLUSH: 3.0000 mL | INTRAVENOUS | Status: DC | PRN

## 2016-02-08 MED ORDER — BISACODYL 10 MG RE SUPP: 10.0000 mg | Freq: Every day | RECTAL | Status: DC | PRN

## 2016-02-08 MED ORDER — BIOTENE DRY MOUTH MT LIQD: 15.0000 mL | OROMUCOSAL | Status: DC | PRN

## 2016-02-08 MED ORDER — SODIUM CHLORIDE 0.9 % IV SOLN: 250.0000 mL | INTRAVENOUS | Status: DC | PRN

## 2016-02-08 MED ORDER — GLYCOPYRROLATE 0.2 MG/ML IJ SOLN
0.2000 mg | INTRAMUSCULAR | Status: DC | PRN
  Filled 2016-02-08: qty 1

## 2016-02-08 MED ORDER — DEXTROSE 50 % IV SOLN
INTRAVENOUS | Status: AC
  Administered 2016-02-08: 25 mL
  Filled 2016-02-08: qty 50

## 2016-02-08 MED ORDER — POLYVINYL ALCOHOL 1.4 % OP SOLN
1.0000 [drp] | Freq: Four times a day (QID) | OPHTHALMIC | Status: DC | PRN
  Filled 2016-02-08: qty 15

## 2016-02-08 MED ORDER — HALOPERIDOL LACTATE 2 MG/ML PO CONC: 0.5000 mg | ORAL | Status: DC | PRN

## 2016-02-08 MED ORDER — SODIUM CHLORIDE 0.9% FLUSH
3.0000 mL | Freq: Two times a day (BID) | INTRAVENOUS | Status: DC
  Administered 2016-02-08: 3 mL via INTRAVENOUS

## 2016-02-08 MED ORDER — ALUM & MAG HYDROXIDE-SIMETH 200-200-20 MG/5ML PO SUSP: 30.0000 mL | Freq: Four times a day (QID) | ORAL | Status: DC | PRN

## 2016-02-08 MED ORDER — ONDANSETRON 4 MG PO TBDP: 4.0000 mg | ORAL_TABLET | Freq: Four times a day (QID) | ORAL | Status: DC | PRN

## 2016-02-08 MED ORDER — LORAZEPAM 1 MG PO TABS: 1.0000 mg | ORAL_TABLET | ORAL | Status: DC | PRN

## 2016-02-08 MED ORDER — GLYCOPYRROLATE 0.2 MG/ML IJ SOLN
0.4000 mg | Freq: Two times a day (BID) | INTRAMUSCULAR | Status: DC
  Administered 2016-02-08 – 2016-02-09 (×2): 0.4 mg via INTRAVENOUS
  Filled 2016-02-08 (×2): qty 2

## 2016-02-08 MED ORDER — HALOPERIDOL LACTATE 5 MG/ML IJ SOLN: 0.5000 mg | INTRAMUSCULAR | Status: DC | PRN

## 2016-02-08 MED ORDER — FENTANYL BOLUS VIA INFUSION
30.0000 ug | INTRAVENOUS | Status: DC | PRN
  Filled 2016-02-08: qty 30

## 2016-02-08 MED ORDER — HALOPERIDOL 0.5 MG PO TABS
0.5000 mg | ORAL_TABLET | ORAL | Status: DC | PRN
  Filled 2016-02-08: qty 1

## 2016-02-08 MED ORDER — GLYCOPYRROLATE 0.2 MG/ML IJ SOLN: 0.2000 mg | INTRAMUSCULAR | Status: DC | PRN

## 2016-02-08 MED ORDER — GLYCOPYRROLATE 1 MG PO TABS: 1.0000 mg | ORAL_TABLET | ORAL | Status: DC | PRN

## 2016-02-08 MED ORDER — SODIUM CHLORIDE 0.9 % IV SOLN
20.0000 ug/h | INTRAVENOUS | Status: DC
  Administered 2016-02-08: 20 ug/h via INTRAVENOUS
  Filled 2016-02-08: qty 50

## 2016-02-08 MED ORDER — LORAZEPAM 2 MG/ML PO CONC: 1.0000 mg | ORAL | Status: DC | PRN

## 2016-02-08 MED ORDER — FENTANYL CITRATE (PF) 100 MCG/2ML IJ SOLN: 25.0000 ug | INTRAMUSCULAR | Status: DC | PRN

## 2016-02-08 MED ORDER — LORAZEPAM 2 MG/ML IJ SOLN: 1.0000 mg | INTRAMUSCULAR | Status: DC | PRN

## 2016-02-08 NOTE — Progress Notes (Signed)
Hypoglycemic Event  CBG: 68  Treatment: D50 IV 25 mL  Symptoms: None  Follow-up CBG: Time: 5:12am CBG Result:136  Possible Reasons for Event: Inadequate meal intake  Comments/MD notified:  Baltazar Najjar, NP    Sheral Apley

## 2016-02-08 NOTE — Progress Notes (Addendum)
[facility] Progress Note  Dialysis Orders: [facility] MWF. EDW 51HD Bath 2k, 2caTime 4hrsHeparin 1700. Access LUA AVF  Hec 16mcg IV/HD Mircera 188mcg Last on 01/26/16 q 2wks hd  Other op labs hgb 9.5 Ca 10.1 phos 8.9 PTH 380  Assessment: 1. HCAP - per primary on vanc/cefepime 2. C diff+ - on vanc PO and IV  3. 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 ESRD - MWF Cont HD on schedule  5. Anemia - Hgb 9.7 Due for next ESA dose 1/10  6. Secondary hyperparathyroidism - cont VDRA/binders when diet resumes  7. HTN/volume - BP soft but using wrist cuff/No BP meds/ Pre HD wt 61.3kg 8. Nutrition/PCM - renal vita/protein supp when diet resumes  9. HIV/AIDS - not compliant with ART  10. Thrombocytopenia - acute on chronic s/p platelet transfusion 11.  EOL - per Dr Posey Pronto conversation with family resulted in decision for DNR and comfort care. Plan is for no further dialysis.  Pt will transition to comfort care.  No further suggestions, appreciate care of primary team and consultants.  Will sign off.   Kelly Splinter MD [employer] pgr [phone]   02/08/2016, 2:17 PM      Subjective: no new c/o's.  Per primary he spoke with pt's father and decisions were made to move towards conservative care.    Objective Vitals:   02/07/16 1859 02/07/16 2025 02/08/16 0436 02/08/16 0448  BP: 119/79 134/82 136/78   Pulse: 86 90 90   Resp:  18 18   Temp:      TempSrc:      SpO2: 100% 96% 99%   Weight:    60.4 kg (133 lb 2.5 oz)  Height:       Physical Exam General: Frail, chronically-ill appearing female NAD Heart: RRR  Lungs: diminished bilat Abdomen: soft ND Extremities: No LE eedema Dialysis Access: LUE AVF cannunlated on HD   Additional Objective Labs: Basic Metabolic Panel:  Recent Labs Lab 02/06/16 0457 02/07/16 0630 02/08/16 0509  NA 131* 124* 128*  K 4.3 4.7 3.5  CL 94* 89* 94*  CO2 26 22  25   GLUCOSE 95 98 140*  BUN 22* 29* 12  CREATININE 4.11* 5.09* 3.25*  CALCIUM 8.5* 8.6* 8.4*  PHOS 7.8* 8.8* 5.5*   Liver Function Tests:  Recent Labs Lab 02/03/16 2214 02/04/16 0526 02/05/16 0253 02/06/16 0457 02/07/16 0630 02/08/16 0509  AST 50* 46* 65*  --   --   --   ALT 54 47 57*  --   --   --   ALKPHOS 277* 249* 250*  --   --   --   BILITOT 0.1* 0.4 0.6  --   --   --   PROT 8.4* 6.8 7.4  --   --   --   ALBUMIN 2.5* 1.9* 2.1* 1.9* 1.9* 1.7*   No results for input(s): LIPASE, AMYLASE in the last 168 hours. CBC:  Recent Labs Lab 02/03/16 2214 02/04/16 0526 02/04/16 2212 02/05/16 0253 02/06/16 0457 02/07/16 0704 02/08/16 0509  WBC 6.0 3.8* 8.7 4.9 5.5 5.6 5.1  NEUTROABS 4.7 2.8  --  4.1  --   --   --   HGB 8.9* 7.9* 8.8* 9.7* 9.6* 9.7* 9.1*  HCT 28.2* 25.9* 27.8* 30.4* 29.7* 30.1* 28.8*  MCV 84.9 84.6 84.8 85.2 84.6 82.7 83.7  PLT 51* 47* 36* 29* 37* 45* 38*   Blood Culture  Component Value Date/Time   SDES CSF 02/04/2016 1404   SPECREQUEST NONE 02/04/2016 1404   CULT NO GROWTH 3 DAYS 02/04/2016 1404   REPTSTATUS 02/07/2016 FINAL 02/04/2016 1404    Cardiac Enzymes: No results for input(s): CKTOTAL, CKMB, CKMBINDEX, TROPONINI in the last 168 hours. CBG:  Recent Labs Lab 02/08/16 0011 02/08/16 0431 02/08/16 0512 02/08/16 0815 02/08/16 1200  GLUCAP 98 68 136* 71 106*   Iron Studies: No results for input(s): IRON, TIBC, TRANSFERRIN, FERRITIN in the last 72 hours. Lab Results  Component Value Date   INR 1.10 02/04/2016   INR 1.37 03/11/2015   Medications:  . [START ON 02/09/2016] darbepoetin (ARANESP) injection - DIALYSIS  100 mcg Intravenous Q Wed-HD  . doxercalciferol  5 mcg Intravenous Q M,W,F-HD  . feeding supplement (PRO-STAT SUGAR FREE 64)  30 mL Oral BID  . fluticasone  2 spray Each Nare Daily  . hydrocortisone cream   Topical BID  . ipratropium-albuterol  3 mL Nebulization Q6H  . multivitamin  1 tablet Oral QHS  . sodium chloride  flush  3 mL Intravenous Q12H  . vancomycin  500 mg Oral Q6H

## 2016-02-08 NOTE — Progress Notes (Signed)
   02/08/16 1255  Clinical Encounter Type  Visited With Patient and family together  Visit Type Other (Comment) ([place] consult)  Referral From Nurse  Consult/Referral To Chaplain  Spiritual Encounters  Spiritual Needs Emotional  Stress Factors  Patient Stress Factors None identified  Family Stress Factors None identified  Introduction to Pt and family. Offered emotional support and blessing.

## 2016-02-08 NOTE — Consult Note (Signed)
Consultation Note Date: 02/08/2016   Patient Name: Paula Becker  DOB: Apr 08, 1975  MRN: [id number]  Age / Sex: 41 y.o., female  PCP: Campbell Riches, MD Referring Physician: Lavina Hamman, MD  Reason for Consultation: Inpatient hospice referral, Non pain symptom management, Pain control and Terminal Care  HPI/Patient Profile: 41 y.o. female  with past medical history of ESRD, HIV, MRSA, necrotizing pneumonia who was admitted on 02/03/2016 with with episodes of non responsiveness.  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  Further the patient was diagnosed with HCAP and C-Diff.  She has a history of non-compliance and her HIV is treatment resistant.  The attending physician discussed goals of care with the patient and she opted for comfort care.  She wishes to discontinue hemo dialysis at this time.   Clinical Assessment and Goals of Care: PMT was called to assist with hospice evaluation and symptom management.  The patient was too confused to answer questions at this time.  She did complain of hunger and back pain.  We attempted to reach out to her father but had to leave a voice mail with a number to return our call.  Primary Decision Maker:  PATIENT    SUMMARY OF RECOMMENDATIONS    Code Status/Advance Care Planning:  DNR    Symptom Management:   Pain - fentanyl gtt and prn boluses  Anxiety - prn ativan  Secretions - robinul scheduled and PRN  Dysphagia 3 diet and comfort feeds.  Additional Recommendations (Limitations, Scope, Preferences):  Full Comfort Care  Palliative Prophylaxis:   Aspiration, Delirium Protocol, Frequent Pain Assessment and Turn Reposition  Psycho-social/Spiritual:   Desire for further Chaplaincy support:   Prognosis:   Less than two weeks.  Patient with severe/acute illness and addition to HIV vasculitis.  She has stopped hemodialysis.  She  is at high risk for an acute event that would take her life at any time.  Discharge Planning: To Be Determined.  Once she is comfortable she is eligible for hospice house if she remains stable for transport.  I was unable to reach her family on the phone to discuss this with them.      Primary Diagnoses: Present on Admission: . Acute encephalopathy . HIV disease ([place]) . Anemia of chronic disease . HCAP (healthcare-associated pneumonia) . HSV (herpes simplex virus) anogenital infection . FSGS (focal segmental glomerulosclerosis) . Severe protein-calorie malnutrition ([place]) . Thrombocytopenia ([place]) . Secondary hyperparathyroidism of renal origin ([place]) . Pressure injury of skin . C. difficile colitis   I have reviewed the medical record, interviewed the patient and family, and examined the patient. The following aspects are pertinent.  Past Medical History:  Diagnosis Date  . Anemia of chronic disease   . ESRD on dialysis ([place])   . Herpes   . History of noncompliance with medical treatment   . HIV (human immunodeficiency virus infection) ([place])   . MRSA (methicillin resistant staph aureus) culture positive   . Necrotizing pneumonia ([place]) 07/2010  .  Pneumonia 06/23/11   RLL patchy, nodular lung disease  . Renal disorder   . Renal insufficiency    Social History   Social History  . Marital status: Single    Spouse name: N/A  . Number of children: N/A  . Years of education: N/A   Social History Main Topics  . Smoking status: Never Smoker  . Smokeless tobacco: Never Used  . Alcohol use No  . Drug use: No  . Sexual activity: Not Asked     Comment: declined condoms   Other Topics Concern  . None   Social History Narrative   Lives in [place] in her house.   Has support from her Dad for doctor visits.   Didn't work ever. Not working now.   Family History  Problem Relation Age of Onset  . Lung disease Mother     passed away at 19 with lung disease  . Hypertension Father     Scheduled Meds: . glycopyrrolate  0.4 mg Intravenous BID  . hydrocortisone cream   Topical BID  . ipratropium-albuterol  3 mL Nebulization Q6H  . sodium chloride flush  3 mL Intravenous Q12H  . sodium chloride flush  3 mL Intravenous Q12H  . vancomycin  500 mg Oral Q6H   Continuous Infusions: . fentaNYL infusion INTRAVENOUS     PRN Meds:.sodium chloride, sodium chloride, acetaminophen **OR** acetaminophen, albuterol, alum & mag hydroxide-simeth, antiseptic oral rinse, bisacodyl, fentaNYL, fentaNYL (SUBLIMAZE) injection, glycopyrrolate **OR** glycopyrrolate **OR** glycopyrrolate, haloperidol **OR** haloperidol **OR** haloperidol lactate, LORazepam **OR** LORazepam **OR** LORazepam, [DISCONTINUED] ondansetron **OR** ondansetron (ZOFRAN) IV, ondansetron **OR** ondansetron (ZOFRAN) IV, polyvinyl alcohol, sodium chloride flush, sodium chloride flush Allergies  Allergen Reactions  . Shellfish-Derived Products Swelling    Facial swelling   Review of Systems  Unable to perform ROS: Mental status change     Physical Exam  Constitutional: She appears well-developed.  Acutely ill.  HENT:  Head: Normocephalic and atraumatic.  Eyes: No scleral icterus.  Cardiovascular: Normal rate.   Pulmonary/Chest: She has wheezes. She has rales.  Increased work of breathing. Very wet  Neurological:  Awake.  Confused.  Quickly falls asleep  Skin: Skin is warm and dry. Rash noted.  Macular rash across back and chest.     Vital Signs: BP 128/81   Pulse 96   Temp 97.7 F (36.5 C) (Oral)   Resp 18   Ht 5\' 4"  (1.626 m)   Wt 60.4 kg (133 lb 2.5 oz)   LMP 01/16/2015   SpO2 98%   BMI 22.86 kg/m  Pain Assessment: 0-10   Pain Score: 4    SpO2: SpO2: 98 % O2 Device:SpO2: 98 % O2 Flow Rate: .O2 Flow Rate (L/min): 2 L/min  IO: Intake/output summary:  Intake/Output Summary (Last 24 hours) at 02/08/16 1528 Last data filed at 02/08/16 1313  Gross per 24 hour  Intake             1770 ml  Output              1100 ml  Net              670 ml    LBM: Last BM Date: 02/07/16 Baseline Weight: Weight: 54.6 kg (120 lb 5.9 oz) Most recent weight: Weight: 60.4 kg (133 lb 2.5 oz)     Palliative Assessment/Data:   Flowsheet Rows   Flowsheet Row Most Recent Value  Intake Tab  Referral Department  Hospitalist  Palliative Care Primary Diagnosis  Other (Comment)  Date Notified  02/08/16  Palliative Care Type  New Palliative care  Reason for referral  End of [place], Other (Comment)  Date of Admission  02/03/16  Date first seen by Palliative Care  02/08/16  # of days Palliative referral response time  0 Day(s)  # of days IP prior to Palliative referral  5  Clinical Assessment  Palliative Performance Scale Score  30%  Pain Max last 24 hours  6  Pain Min Last 24 hours  3  Dyspnea Max Last 24 Hours  3  Dyspnea Min Last 24 hours  1  Psychosocial & Spiritual Assessment  Palliative Care Outcomes  Patient/Family meeting held?  Yes  Who was at the meeting?  patient.  Attempted to reach father  Palliative Care Outcomes  Improved pain interventions, Improved non-pain symptom therapy, Provided end of life care assistance      Time In: 2:50 Time Out: 3:40 Time Total: 50 min. Greater than 50%  of this time was spent counseling and coordinating care related to the above assessment and plan.  Signed by: Imogene Burn, PA-C Palliative Medicine Pager: [phone]  Please contact Palliative Medicine Team phone at [phone] for questions and concerns.  For individual provider: See Shea Evans

## 2016-02-08 NOTE — Consult Note (Signed)
   [facility] CM Inpatient Consult   02/08/2016  Paula Becker 02-Jun-1975 [id number]   Spoke with inpatient RNCM who states the patient is for comfort care per MD. Met with patient at the bedside.  Patient was alert and oriented to self and birth date.  If patient becomes comfort care [facility] will not follow as she will receive care management services, if she discharges with Hospice.  Will follow up only if appropriate.  For questions, please contact:  Natividad Brood, RN BSN [facility] Liaison  [phone] business mobile phone Toll free office [phone]

## 2016-02-08 NOTE — Progress Notes (Signed)
Patient ID: Paula Becker, female   DOB: [birth year], 41 y.o.   MRN: [id number]          [facility]    Date of Admission:  02/03/2016            Kyari is more alert today but confused. She cannot tell me anything about the conversation she had earlier today with Dr. Posey Pronto and her father or a conversation she had about her medications with our infectious disease pharmacist, Dimitri Ped. I did speak with Dr. Posey Pronto and in his discussions with her father a decision was made to change to comfort measures only and discontinue hemodialysis. I will continue oral vancomycin as this may keep her more comfortable by decreasing diarrhea associated with C. difficile colitis. I have stopped IV vancomycin and cefepime.  Michel Bickers, MD [facility] for [facility] [phone] pager   [phone] cell

## 2016-02-08 NOTE — Progress Notes (Signed)
CM received consult: Comfort care, anticipate hospital death but may need in patient hospice. CM made CSW aware of possible need for in patient hospice.  Whitman Hero RN,BSN,CM

## 2016-02-08 NOTE — Progress Notes (Signed)
[facility] Progress Note  Patient: Paula Becker [id number]   PCP: Bobby Rumpf, MD DOB: 1975/08/24   DOA: 02/03/2016   DOS: 02/08/2016   Date of Service: the patient was seen and examined on 02/08/2016  Brief hospital course: Pt. with PMH of ESRD on HD MWF, HIV noncompliant with medication, herpes, chronic bicytopenia; admitted on 02/03/2016, with complaint of cough and diarrhea, was found to have C. difficile colitis as well as HCAP. Nephrology consulted for continuation of hemodialysis. Neurology consulted for HIV vasculitis as well as acute restrictive disease seen on the MRI. Currently further plan is Complete comfort care  Assessment and Plan: 1. Healthcare associated pneumonia with sepsis. Appreciate input from ID. Patient was started on on vancomycin and cefepime. Last day 02/08/2016 per ID.  2. C. difficile colitis. Present on admission Patient had diarrhea prior to admission and had persistent diarrhea here in the hospital as well. C. difficile was positive. Pharmacy consulted and the patient started on oral vancomycin and high-dose given immunocompromise status. Rectal tube required due to significant diarrhea. Antibiotic course for pneumonia was also shortened due to C. difficile colitis with persistent diarrhea.  3. Acute encephalopathy with hypothermia. 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 Suspected meningitis. Ruled out based on the CSF picture.  Encephalopathy improved significantly after improvement in hypothermia. Patient had an MRI of the brain which is showing possible acute ischemia associated with HIV vasculitis. Neurology was consulted. Does not appear to be having any acute stroke at present.  treatment for patient's HIV vasculitis with acute encephalopathy would be HAART which the patient had adamantly refused multiple times in the past. Possibility of CNS lymphoma cannot be ruled out given HIV status, follow up on cytology.  EEG  unremarkable. Appreciate neurology input.   4. ESRD on hemodialysis.  patient missed her hemodialysis treatment Monday and Wednesday. Appreciate nephrology input.  5. acute on chronic thrombocytopenia. Chronic anemia, anemia of chronic kidney disease. Patient has significant drop in her platelet count, does not appear to be TTP HUS or DIC-like picture with normal INR and a normal fibrinogen level. No schistocytes seen on the smear as well. HIT negative. This is likely appears to be acute response to sepsis. Patient is S/P 1 platelet transfusion for LP. No active bleeding currently reported. Stable at present.   6. dysphagia. Speech therapy consulted. Currently on clear liquid diet due to lethargy.  7. History of HIV. Patient with CD4 count in 20. Refused to remain compliant with HIV treatment in the past.  8. protein calorie malnutrition. Comfort feeds  9. stage II left buttock ulcer. Present on admission. Foam dressing. Daily change.  10. Goals of care discussion. I had extensive discussion with patient's father who was present in patient's room. Patient remained lethargic and unable to advance her questions follow command. Patient's father mentioned that his primary goal is to keep the patient comfortable. I explained patient's disease progress and current presentation. Patient with ESRD, HIV with frank AIDS, protein calorie malnutrition presents with severe healthcare associated pneumonia causing sepsis and thrombocytopenia along with C. difficile colitis. Patient had adamantly refused HIV treatment in the past multiple times. Despite aggressive IV antibiotics patient does not show any significant improvement in her respiratory status as well as encephalopathy. Unable to use IV steroids or improvement in her encephalopathy for CNS vasculitis as well as for respiratory infection, as it will undoubtedly reduce patient's immunity which is orally compromised given her HIV  status. Unable to use prolonged course of antibiotics for pneumonia.  Despite aggressive treatment C. difficile colitis has not responded. Patient remains high risk for aspiration given her encephalopathy therefore speech therapy has recommended to maintain the patient on clear liquid diet until fully awake. Patient with severe protein calorie malnutrition and ESRD. Given all this findings patient's prognosis is significantly poor, and I discussed with patient's father, and after understanding these findings they requested the patient's care to be transferred to comfort care.  palliative care consulted for assistance. Patient may likely benefit from inpatient hospice referral.   Bowel regimen: last BM 02/08/2016  Diet: Comfort feeds DVT Prophylaxis: mechanical compression device  Advance goals of care discussion: DNR/DNI, see above  Family Communication: family was present at bedside, at the time of interview.   Disposition:  Discharge to home. Expected discharge date: 02/09/2016, improvement in symptoms  Consultants: Nephrology, neurology, infectious disease, intervention radiology  Procedures: Hemodialysis, IR guided lumbar puncture, platelet transfusion  Antibiotics: Anti-infectives    Start     Dose/Rate Route Frequency Ordered Stop   02/07/16 2000  ceFEPIme (MAXIPIME) 2 g in dextrose 5 % 50 mL IVPB  Status:  Discontinued     2 g 100 mL/hr over 30 Minutes Intravenous Every M-W-F (2000) 02/07/16 0851 02/08/16 0935   02/07/16 0852  vancomycin (VANCOCIN) 500-5 MG/100ML-% IVPB    Comments:  Celesta Gentile   : cabinet override      02/07/16 0852 02/07/16 0946   02/05/16 1800  vancomycin (VANCOCIN) 50 mg/mL oral solution 500 mg     500 mg Oral Every 6 hours 02/05/16 1711 02/19/16 1759   02/04/16 1801  vancomycin (VANCOCIN) 500-5 MG/100ML-% IVPB    Comments:  Cherylann Banas   : cabinet override      02/04/16 1801 02/04/16 1942   02/04/16 1600  vancomycin (VANCOCIN) 500 mg in sodium  chloride 0.9 % 100 mL IVPB  Status:  Discontinued     500 mg 100 mL/hr over 60 Minutes Intravenous Every M-W-F (Hemodialysis) 02/04/16 1051 02/08/16 0935   02/04/16 1100  ceFEPIme (MAXIPIME) 1 g in dextrose 5 % 50 mL IVPB  Status:  Discontinued     1 g 100 mL/hr over 30 Minutes Intravenous Every 24 hours 02/04/16 1044 02/07/16 0851   02/04/16 1000  oseltamivir (TAMIFLU) capsule 75 mg  Status:  Discontinued     75 mg Oral Daily 02/04/16 0815 02/04/16 0934   02/04/16 1000  oseltamivir (TAMIFLU) capsule 30 mg  Status:  Discontinued     30 mg Oral Daily 02/04/16 0934 02/04/16 1549   02/04/16 0730  sulfamethoxazole-trimethoprim (BACTRIM) 480 mg in dextrose 5 % 500 mL IVPB  Status:  Discontinued     480 mg 353.3 mL/hr over 90 Minutes Intravenous Every 24 hours 02/04/16 0721 02/04/16 1019   02/04/16 0530  cefTRIAXone (ROCEPHIN) 2 g in dextrose 5 % 50 mL IVPB  Status:  Discontinued     2 g 100 mL/hr over 30 Minutes Intravenous Every 12 hours 02/04/16 0514 02/04/16 1019   02/04/16 0300  acyclovir (ZOVIRAX) 250 mg in dextrose 5 % 100 mL IVPB  Status:  Discontinued     5 mg/kg  49.9 kg (Order-Specific) 105 mL/hr over 60 Minutes Intravenous Every 24 hours 02/04/16 0240 02/04/16 1019   02/04/16 0245  ampicillin (OMNIPEN) 2 g in sodium chloride 0.9 % 50 mL IVPB  Status:  Discontinued     2 g 150 mL/hr over 20 Minutes Intravenous Every 12 hours 02/04/16 0241 02/04/16 1019   02/04/16 0200  vancomycin (VANCOCIN)  IVPB 1000 mg/200 mL premix     1,000 mg 200 mL/hr over 60 Minutes Intravenous  Once 02/04/16 0145 02/04/16 0605   02/04/16 0145  ceFEPIme (MAXIPIME) 1 g in dextrose 5 % 50 mL IVPB  Status:  Discontinued     1 g 100 mL/hr over 30 Minutes Intravenous Every 24 hours 02/04/16 0145 02/04/16 0509      Subjective: Remains confused, unable to answer questions unable to follow command.  Objective: Physical Exam: Vitals:   02/08/16 0436 02/08/16 0448 02/08/16 1453 02/08/16 1514  BP: 136/78   128/81   Pulse: 90   96  Resp: 18     Temp:      TempSrc:      SpO2: 99%  98% 98%  Weight:  60.4 kg (133 lb 2.5 oz)    Height:        Intake/Output Summary (Last 24 hours) at 02/08/16 1754 Last data filed at 02/08/16 1500  Gross per 24 hour  Intake             1770 ml  Output             1100 ml  Net              670 ml   Filed Weights   02/07/16 0645 02/07/16 1100 02/08/16 0448  Weight: 61.3 kg (135 lb 2.3 oz) 59.3 kg (130 lb 11.7 oz) 60.4 kg (133 lb 2.5 oz)    General: Confused and lethargic, unable to follow command, Appear in moderate distress, affect appropriate Eyes: PERRL, Conjunctiva normal ENT: Oral Mucosa clear moist. Neck: difficult to assess JVD, no Abnormal Mass Or lumps Cardiovascular: S1 and S2 Present, aortic systolic Murmur, Respiratory: Bilateral Air entry equal and Decreased, positive use of accessory muscle, bilateral Crackles, no wheezes Abdomen: Bowel Sound present, Soft and no tenderness Skin: no redness, no Rash, no induration Extremities: trace Pedal edema, no calf tenderness Neurologic: unable to follow commands  Data Reviewed: CBC:  Recent Labs Lab 02/03/16 2214 02/04/16 0526 02/04/16 2212 02/05/16 0253 02/06/16 0457 02/07/16 0704 02/08/16 0509  WBC 6.0 3.8* 8.7 4.9 5.5 5.6 5.1  NEUTROABS 4.7 2.8  --  4.1  --   --   --   HGB 8.9* 7.9* 8.8* 9.7* 9.6* 9.7* 9.1*  HCT 28.2* 25.9* 27.8* 30.4* 29.7* 30.1* 28.8*  MCV 84.9 84.6 84.8 85.2 84.6 82.7 83.7  PLT 51* 47* 36* 29* 37* 45* 38*   Basic Metabolic Panel:  Recent Labs Lab 02/04/16 0526 02/05/16 0253 02/06/16 0457 02/07/16 0630 02/08/16 0509  NA 138 138 131* 124* 128*  K 4.2 3.6 4.3 4.7 3.5  CL 102 99* 94* 89* 94*  CO2 25 28 26 22 25   GLUCOSE 63* 69 95 98 140*  BUN 27* 10 22* 29* 12  CREATININE 4.16* 2.50* 4.11* 5.09* 3.25*  CALCIUM 8.0* 8.6* 8.5* 8.6* 8.4*  MG  --  1.9  --   --   --   PHOS  --   --  7.8* 8.8* 5.5*    Liver Function Tests:  Recent Labs Lab 02/03/16 2214  02/04/16 0526 02/05/16 0253 02/06/16 0457 02/07/16 0630 02/08/16 0509  AST 50* 46* 65*  --   --   --   ALT 54 47 57*  --   --   --   ALKPHOS 277* 249* 250*  --   --   --   BILITOT 0.1* 0.4 0.6  --   --   --  PROT 8.4* 6.8 7.4  --   --   --   ALBUMIN 2.5* 1.9* 2.1* 1.9* 1.9* 1.7*   No results for input(s): LIPASE, AMYLASE in the last 168 hours.  Recent Labs Lab 02/04/16 0527  AMMONIA 28   Coagulation Profile:  Recent Labs Lab 02/04/16 0115  INR 1.10   Cardiac Enzymes: No results for input(s): CKTOTAL, CKMB, CKMBINDEX, TROPONINI in the last 168 hours. BNP (last 3 results) No results for input(s): PROBNP in the last 8760 hours.  CBG:  Recent Labs Lab 02/08/16 0431 02/08/16 0512 02/08/16 0815 02/08/16 1200 02/08/16 1636  GLUCAP 68 136* 71 106* 106*    Studies: No results found.   Scheduled Meds: . glycopyrrolate  0.4 mg Intravenous BID  . hydrocortisone cream   Topical BID  . ipratropium-albuterol  3 mL Nebulization Q6H  . sodium chloride flush  3 mL Intravenous Q12H  . sodium chloride flush  3 mL Intravenous Q12H  . vancomycin  500 mg Oral Q6H   Continuous Infusions: . fentaNYL infusion INTRAVENOUS     PRN Meds: sodium chloride, sodium chloride, acetaminophen **OR** acetaminophen, albuterol, alum & mag hydroxide-simeth, antiseptic oral rinse, bisacodyl, fentaNYL, glycopyrrolate **OR** glycopyrrolate **OR** glycopyrrolate, haloperidol **OR** haloperidol **OR** haloperidol lactate, LORazepam **OR** LORazepam **OR** LORazepam, [DISCONTINUED] ondansetron **OR** ondansetron (ZOFRAN) IV, ondansetron **OR** ondansetron (ZOFRAN) IV, polyvinyl alcohol, sodium chloride flush, sodium chloride flush  Time spent: 30 minutes  Author: Berle Mull, MD [facility] Pager: [phone] 02/08/2016 5:54 PM  If 7PM-7AM, please contact night-coverage at [email or web address], password [facility]

## 2016-02-08 NOTE — Care Management Note (Signed)
Case Management Note  Patient Details  Name: Paula Becker MRN: [id number] Date of Birth: August 20, 1975  Subjective/Objective:                Pt. with PMH of ESRD on HD MWF, HIV noncompliant with medication, herpes, chronic bicytopenia; admitted on 02/03/2016, with complaint of cough and diarrhea, was found to have C. difficile colitis as well as HCAP.  From home with 42 yr old son per pt.   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  Expected Discharge Date:                 Expected Discharge Plan:    In-House Referral:    Discharge planning Services  CM Consult If dscussed at Long Length of Stay Meetings, dates discussed:    Additional Comments:  Sharin Mons, RN 02/08/2016, 2:31 PM

## 2016-02-09 DIAGNOSIS — D696 Thrombocytopenia, unspecified: Secondary | ICD-10-CM

## 2016-02-09 DIAGNOSIS — Z7189 Other specified counseling: Secondary | ICD-10-CM

## 2016-02-09 LAB — GLUCOSE, CAPILLARY: Glucose-Capillary: 135 mg/dL — ABNORMAL HIGH (ref 65–99)

## 2016-02-09 LAB — HSV(HERPES SMPLX VRS)ABS-I+II(IGG)-CSF: HSV Type I/II Ab, IgG CSF: 4.73 IV — ABNORMAL HIGH (ref <=0.89)

## 2016-02-09 MED ORDER — SODIUM CHLORIDE 0.9 % IV SOLN: 20.0000 ug/h | INTRAVENOUS | Status: AC

## 2016-02-09 MED ORDER — FENTANYL BOLUS VIA INFUSION: 10.0000 ug | INTRAVENOUS | Status: AC | PRN

## 2016-02-09 MED ORDER — GLYCOPYRROLATE 0.2 MG/ML IJ SOLN: 0.4000 mg | Freq: Two times a day (BID) | INTRAMUSCULAR | Status: AC

## 2016-02-09 MED ORDER — LORAZEPAM 2 MG/ML PO CONC
1.0000 mg | ORAL | Status: AC | PRN
Start: 2016-02-09 — End: ?

## 2016-02-09 MED ORDER — GLYCOPYRROLATE 1 MG PO TABS: 1.0000 mg | ORAL_TABLET | ORAL | Status: AC | PRN

## 2016-02-09 MED ORDER — VANCOMYCIN 50 MG/ML ORAL SOLUTION: 500.0000 mg | Freq: Four times a day (QID) | ORAL | Status: AC

## 2016-02-09 NOTE — Progress Notes (Signed)
Approximately 225 ml of Fentanyl IV wasted in the sink with Ginger Gleason RN

## 2016-02-09 NOTE — Progress Notes (Signed)
Stopped by patient's room to ensure she was comfortable.  The patient was awake and alert in NAD.  Father, nephew and step mother were at bedside.  Patient stated back pain was improved on medication.  She was asking for food.  Answered family's questions.  They are familiar with Hospice and comfortable with Ms. Maranto being transferred to [facility].  Greatly appreciate Erling Conte and [facility].  Total time spent:  15 min.  Imogene Burn, [facility] Pager: [phone]

## 2016-02-09 NOTE — Discharge Summary (Addendum)
Physician Discharge Summary  Paula Becker [id number] DOB: December 13, 1975  PCP: Bobby Rumpf, MD  Admit date: 02/03/2016 Discharge date: 02/09/2016  Recommendations for Outpatient Follow-up:  1. MD at [place] [facility].  Home Health: None Equipment/Devices: None    Discharge Condition: Critical, poor prognosis, expected decline and death.  CODE STATUS: DO NOT RESUSCITATE  Diet recommendation: Heart healthy diet/may transition to comfort feeds of choice at [facility].  Discharge Diagnoses:  Principal Problem:   HCAP (healthcare-associated pneumonia) Active Problems:   Anemia of chronic disease   Severe protein-calorie malnutrition (HCC)   HSV (herpes simplex virus) anogenital infection   Secondary hyperparathyroidism of renal origin ([place])   FSGS (focal segmental glomerulosclerosis)   HIV disease ([place])   ESRD on dialysis ([place])   Acute encephalopathy   Thrombocytopenia (HCC)   Pressure injury of skin   C. difficile colitis   Terminal care   Palliative care encounter   Encounter for hospice care discussion   Brief/Interim Summary: 41 y.o. female  with past medical history of ESRD, HIV, MRSA, necrotizing pneumonia who was admitted on 02/03/2016 with with episodes of non responsiveness.  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  Further the patient was diagnosed with HCAP and C-Diff.  She has a history of non-compliance and her HIV is treatment resistant.  The attending physician discussed goals of care with the patient/family and comfort care was opted. HD was discontinued.  Assessment and Plan: 1. Healthcare associated pneumonia with sepsis. Patient was empirically treated with vancomycin and cefepime. Patient has now been transitioned to comfort care and will be discharging to [facility] for hospice. Now off of antibiotics for this indication.  2. C. difficile colitis. Present on admission Patient had diarrhea prior to admission and had  persistent diarrhea here in the hospital as well. C. difficile was positive. Pharmacy consulted and the patient started on oral vancomycin at high-dose given immunocompromise status. Rectal tube required due to significant diarrhea. As per infectious disease follow-up 1/9, would continue oral vancomycin at DC so as to reduce diarrhea and thereby provide comfort.  3. Acute encephalopathy with hypothermia. 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 Suspected meningitis. Ruled out based on the CSF picture. Patient had an MRI of the brain which is showing possible acute ischemia associated with HIV vasculitis. Neurology was consulted. Does not appear to be having any acute stroke at present. Treatment for patient's HIV vasculitis with acute encephalopathy would be HAART which the patient had adamantly refused multiple times in the past. Possibility of CNS lymphoma cannot be ruled out given HIV status, follow up on cytology. EEG unremarkable. Appreciate neurology input.   4. ESRD on hemodialysis.  patient missed her hemodialysis treatment Monday and Wednesday. Appreciate nephrology input. Patient declined further hemodialysis and has been transitioned to comfort care.  5. acute on chronic thrombocytopenia. Chronic anemia, anemia of chronic kidney disease. Patient had significant drop in her platelet count, does not appear to be TTP HUS or DIC-like picture with normal INR and a normal fibrinogen level. No schistocytes seen on the smear as well. HIT negative. This is likely appears to be acute response to sepsis. Patient is S/P 1 platelet transfusion for LP. No active bleeding currently reported. Stable at present.   6. dysphagia. Speech therapy consulted.  7. History of HIV. Patient with CD4 count in 20. Refused to remain compliant with HIV treatment in the past.  8. protein calorie malnutrition. Comfort feeds  9. stage II left  buttock ulcer. Present on admission. Foam  dressing. Daily change.  10. Hyponatremia Could be multifactorial related to ESRD on HD, poor oral intake and dehydration or SIADH.  11. Goals of care discussion. Hospitalist rounding MD yesterday had extensive discussion with patient/family. Their goal after the discussion was primarily of comfort. He explained patient's disease progress and current presentation. Patient with ESRD, HIV with frank AIDS, protein calorie malnutrition presents with severe healthcare associated pneumonia causing sepsis and thrombocytopenia along with C. difficile colitis. Patient had adamantly refused HIV treatment in the past multiple times. Despite aggressive Rx patient does not show any significant improvement in her encephalopathy. Unable to use IV steroids for improvement in her encephalopathy for CNS vasculitis as well as for respiratory infection, as it will undoubtedly reduce patient's immunity which is already compromised given her HIV status. Patient remains high risk for aspiration given her encephalopathy. Given all thse findings patient's prognosis is significantly poor, and he discussed with patient's father, and after understanding these findings they requested the patient's care to be transferred to complete comfort care. Patient was seen by palliative care team and is felt appropriate to discharge to residential hospice for end of life comfort oriented care.  Discharge Instructions  Discharge Instructions    Call MD for:  difficulty breathing, headache or visual disturbances    Complete by:  As directed    Call MD for:  extreme fatigue    Complete by:  As directed    Call MD for:  persistant dizziness or light-headedness    Complete by:  As directed    Call MD for:  persistant nausea and vomiting    Complete by:  As directed    Call MD for:  severe uncontrolled pain    Complete by:  As directed    Call MD for:  temperature >100.4    Complete by:  As directed    Diet - low sodium heart healthy     Complete by:  As directed    Increase activity slowly    Complete by:  As directed        Medication List    STOP taking these medications   acyclovir ointment 5 % Commonly known as:  ZOVIRAX   albuterol 108 (90 Base) MCG/ACT inhaler Commonly known as:  PROVENTIL HFA;VENTOLIN HFA   alum & mag hydroxide-simeth 200-200-20 MG/5ML suspension Commonly known as:  MAALOX/MYLANTA   Darbepoetin Alfa 100 MCG/0.5ML Sosy injection Commonly known as:  ARANESP   dolutegravir 50 MG tablet Commonly known as:  TIVICAY   doxercalciferol 4 MCG/2ML injection Commonly known as:  HECTOROL   feeding supplement Liqd   gabapentin 300 MG capsule Commonly known as:  NEURONTIN   HYDROcodone-acetaminophen 5-325 MG tablet Commonly known as:  NORCO/VICODIN   hydrOXYzine 25 MG tablet Commonly known as:  ATARAX/VISTARIL   lopinavir-ritonavir 400-100 MG/5ML solution Commonly known as:  KALETRA   multivitamin Tabs tablet   valACYclovir 500 MG tablet Commonly known as:  VALTREX   VIREAD 300 MG tablet Generic drug:  tenofovir   zidovudine 100 MG capsule Commonly known as:  RETROVIR     TAKE these medications   fentaNYL 2,500 mcg in sodium chloride 0.9 % 200 mL Inject 20 mcg/hr into the vein continuous. 20 mcg/hr (2 mL/hr), Intravenous, at 2 mL/hr, Continuous   fentaNYL Soln Commonly known as:  SUBLIMAZE Inject 10 mcg into the vein every 15 (fifteen) minutes as needed (uncontrolled pain, distress or if respiratory rate is greater than 25).  glycopyrrolate 0.2 MG/ML injection Commonly known as:  ROBINUL Inject 2 mLs (0.4 mg total) into the vein 2 (two) times daily.   glycopyrrolate 1 MG tablet Commonly known as:  ROBINUL Take 1 tablet (1 mg total) by mouth every 4 (four) hours as needed (excessive secretions).   LORazepam 2 MG/ML concentrated solution Commonly known as:  ATIVAN Place 0.5 mLs (1 mg total) under the tongue every 4 (four) hours as needed for anxiety.   vancomycin 50  mg/mL oral solution Commonly known as:  VANCOCIN Take 10 mLs (500 mg total) by mouth every 6 (six) hours. First dose on Sat 02/05/16 at 1800, For 14 days      Follow-up Information    MD at [facility]. Schedule an appointment as soon as possible for a visit.   Why:  Follow up re EOL comfort care.         Allergies  Allergen Reactions  . Shellfish-Derived Products Swelling    Facial swelling    Consultations: Nephrology, neurology, infectious disease, intervention radiology    Procedures/Studies: Ct Angio Head W Or Wo Contrast  Result Date: 02/06/2016 CLINICAL DATA:  41 y/o F; persistent frontal headaches, history of HIV, with concern for vasculitis. EXAM: CT ANGIOGRAPHY HEAD TECHNIQUE: Multidetector CT imaging of the head was performed using the standard protocol during bolus administration of intravenous contrast. Multiplanar CT image reconstructions and MIPs were obtained to evaluate the vascular anatomy. CONTRAST:  50 cc Isovue 370 COMPARISON:  02/04/2016 MRI of the head and CT of the head. FINDINGS: CT HEAD Brain: No evidence of acute infarction, hemorrhage, hydrocephalus, extra-axial collection or mass lesion/mass effect. Patchy hypoattenuation of white matter corresponds to T2 FLAIR signal abnormality on the prior MRI. Stable dystrophic calcification within the right frontal periventricular white matter. Vascular: As below. Skull: Normal. Negative for fracture or focal lesion. Sinuses: Normal pneumatization of mastoid air cells. Opacification of external auditory canals is likely cerumen. Moderate diffuse paranasal sinus disease greatest in the right maxillary sinus. The right globe is mildly larger than the left lobe with a diameter of 28 mm compared with 26 mm. Orbits: No acute finding. CTA HEAD Anterior circulation: No proximal occlusion, aneurysm, or vascular malformation. There is narrowing of the bilateral a 2 segments best seen on the sagittal MIP reconstruction and there is  narrowing of the prefrontal left MCA M2 branch (series 606, image 21). Posterior circulation: No significant stenosis, proximal occlusion, aneurysm, or vascular malformation. Venous sinuses: As permitted by contrast timing, patent. Anatomic variants: Small anterior communicating artery and possible diminutive right posterior communicating artery. No left posterior communicating artery identified, likely hypoplastic or absent. A thin Delayed phase: No abnormal intracranial enhancement. IMPRESSION: 1. Mild narrowing of the bilateral A2 segments and a prefrontal left MCA M2 branch. 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 2. No large vessel occlusion, aneurysm, or vascular malformation. 3. No acute intracranial abnormality is identified. 4. Incidentally noted mild enlargement of the right globe relative to the left globe of uncertain significance, possibly a staphyloma. Clinical correlation is recommended. Electronically Signed   By: Kristine Garbe M.D.   On: 02/06/2016 00:20   Dg Chest 2 View  Result Date: 02/03/2016 CLINICAL DATA:  Sepsis EXAM: CHEST  2 VIEW COMPARISON:  02/02/2016, 09/29/2015 FINDINGS: Hyperinflation is present. Bibasilar bronchiectasis and bronchial thickening as before. No change in bibasilar infiltrates. Probable tiny effusions. Stable mild cardiomegaly without overt failure. No pneumothorax. Surgical  clips in the left breast. IMPRESSION: No significant interval change in bibasilar bronchiectasis and bibasilar infiltrates with probable tiny effusions. Electronically Signed   By: Donavan Foil M.D.   On: 02/03/2016 23:02   Dg Chest 2 View  Result Date: 02/02/2016 CLINICAL DATA:  Productive cough and nausea for 1 day, history end-stage renal disease, HIV, pneumonia EXAM: CHEST  2 VIEW COMPARISON:  09/29/2015 FINDINGS: Normal heart size, mediastinal contours, and pulmonary vascularity.  Atherosclerotic calcification aorta. Hyperinflated lungs with bibasilar bronchiectasis. Bibasilar infiltrates versus atelectasis, increased on RIGHT. Tiny RIGHT pleural effusion, new. Mild diffuse interstitial prominence unchanged. No pneumothorax. Bones unremarkable. IMPRESSION: Emphysematous changes with bibasilar bronchiectasis, bibasilar infiltrates versus atelectasis increased on RIGHT, and tiny RIGHT pleural effusion. Electronically Signed   By: Lavonia Dana M.D.   On: 02/02/2016 13:21   Ct Head W Or Wo Contrast  Result Date: 02/04/2016 CLINICAL DATA:  Generalized weakness and diarrhea at dialysis today. Followup encephalopathy. AIDS. EXAM: CT HEAD WITHOUT AND WITH CONTRAST TECHNIQUE: Contiguous axial images were obtained from the base of the skull through the vertex without and with intravenous contrast CONTRAST:  75 cc ISOVUE-300 IOPAMIDOL (ISOVUE-300) INJECTION 61% COMPARISON:  CT HEAD April 05, 2010 FINDINGS: BRAIN: The ventricles and sulci are normal. No intraparenchymal hemorrhage, mass effect nor midline shift. No acute large vascular territory infarcts. Punctate RIGHT frontal parenchymal calcification. No abnormal extra-axial fluid collections. Basal cisterns are patent. VASCULAR: Mild calcific atherosclerosis of the carotid siphons. SKULL/SOFT TISSUES: No skull fracture. No significant soft tissue swelling. ORBITS/SINUSES: The included ocular globes and orbital contents are normal.Mild paranasal sinusitis. Mastoid air cells are well aerated. Soft tissue within the external auditory canals compatible with cerumen. OTHER: None. IMPRESSION: No acute intracranial process. RIGHT frontal lobe punctate calcification most compatible with prior infection. Chronic sinusitis. Mild atherosclerosis. Electronically Signed   By: Elon Alas M.D.   On: 02/04/2016 01:05   Mr Brain Wo Contrast  Result Date: 02/04/2016 CLINICAL DATA:  Acute encephalopathy. Personal history of HIV, end-stage renal disease,  chronic anemia, aunt genital herpes. Increasing confusion and altered mental status. EXAM: MRI HEAD WITHOUT CONTRAST TECHNIQUE: Multiplanar, multiecho pulse sequences of the brain and surrounding structures were obtained without intravenous contrast. COMPARISON:  CT head without contrast 02/04/2016. CT head without contrast 04/05/2010. FINDINGS: Brain: Moderate diffuse bilateral periventricular and subcortical confluent T2 changes are present. T2 changes extend through the posterior limb of the internal capsule bilaterally. Patchy areas of restricted diffusion involve the lentiform nuclei and centrum semi ovale bilaterally. No acute hemorrhage or mass lesion is present. The ventricles are of normal size. Insert pass fluid The brainstem and cerebellum are unremarkable. Internal auditory canals are within normal limits. Vascular: Flow is present in the major intracranial arteries. Skull and upper cervical spine: The skullbase is within normal limits. The sagittal images are distorted by patient motion. Craniocervical junction appears to be within normal limits. Midline sagittal structures are unremarkable. Sinuses/Orbits: The right maxillary sinus is near completely opacified. Diffuse mucosal thickening is present throughout the ethmoid air cells bilaterally and bilateral sphenoid sinuses. Right greater than left frontal mucosal thickening is present. There is some fluid in the inferior right mastoid air cells. No obstructing nasopharyngeal lesion is evident. The right globe is enlarged, likely congenital. Globes and orbits are otherwise within normal limits. IMPRESSION: 1. Patchy restricted diffusion within the lentiform nuclei and centrum semi ovale bilaterally. This likely represents acute ischemia associated with HIV vasculitis. 2. Extensive confluent T2 change bilaterally appear to progressed since the  2012. This is likely also related to HIV vasculitis. Metabolic encephalopathy is also considered. 3. Diffuse  sinus disease Electronically Signed   By: San Morelle M.D.   On: 02/04/2016 07:52   Dg Chest Port 1 View  Result Date: 02/07/2016 CLINICAL DATA:  Status post dialysis dialysis. Shortness of breath. History of HIV, previous episodes of pneumonia EXAM: PORTABLE CHEST 1 VIEW COMPARISON:  Chest x-ray of February 03, 2016 FINDINGS: The pulmonary interstitial markings are increased bilaterally. There is a large right pleural effusion and smaller left pleural effusion layering posteriorly. The right heart border is obscured. The cardiac silhouette is normal in size. The trachea is midline. IMPRESSION: Pulmonary edema and bilateral pleural effusions consistent with CHF. One cannot exclude underlying pneumonia in the appropriate setting. Electronically Signed   By: David  Martinique M.D.   On: 02/07/2016 13:34   Dg Fluoro Guide Lumbar Puncture  Result Date: 02/04/2016 CLINICAL DATA:  41 year old female with HIV. Acute encephalopathy, weakness, hypothermia, thrombocytopenia. Request for diagnostic lumbar puncture with fluoroscopic guidance. Initial encounter. EXAM: DIAGNOSTIC LUMBAR PUNCTURE UNDER FLUOROSCOPIC GUIDANCE FLUOROSCOPY TIME:  Fluoroscopy Time:  0 minutes 6 seconds Radiation Exposure Index (if provided by the fluoroscopic device): Number of Acquired Spot Images: 0 PROCEDURE: Informed consent was obtained from the patient prior to the procedure, including potential complications of headache, allergy, and pain. A "time-out" was performed. With the patient prone, the lower back was prepped with Betadine. 1% Lidocaine was used for local anesthesia. Lumbar puncture was performed at the L2-L3 level using right sub laminar technique, a 3.5 inch x 20 gauge needle with return of clear, colorless CSF with an opening pressure of 20 cm water. 17 ml of CSF were obtained for laboratory studies. The needle was withdrawn, direct pressure was held and hemostasis was noted. The patient tolerated the procedure well and there  were no apparent complications. Appropriate post procedural orders were placed on the chart. The patient was returned to the inpatient floor in stable condition for continued treatment. IMPRESSION: 1. Fluoroscopic guided lumbar puncture at L2-L3. 2. Clear CSF with an opening pressure of 20 cm of water. 17 mL of CSF collected and sent to the lab for analysis. Electronically Signed   By: Genevie Ann M.D.   On: 02/04/2016 14:16      Subjective: Somnolent but easily arousable. Oriented to self and partly to place.Denies complaints. Denies pain or dyspnea. As per RN, no acute issues.  Discharge Exam:  Vitals:   02/08/16 1453 02/08/16 1514 02/08/16 2109 02/09/16 0502  BP:  128/81 (!) 138/92 (!) 154/87  Pulse:  96 99 87  Resp:   20 18  Temp:   97.3 F (36.3 C)   TempSrc:   Oral   SpO2: 98% 98% 98% 100%  Weight:      Height:        General: Chronically ill-looking young female, moderately built, frail and cachectic, lying comfortably propped up in bed without distress.  Cardiovascular: S1 & S2 heard, RRR, S1/S2 +. No murmurs, rubs, gallops or clicks. No JVD or pedal edema. not on telemetry.  Respiratory: poor inspiratory effort and decreased breath sounds bilaterally but without wheezing, rhonchi or crackles. No increased work of breathing. Abdominal:  Non distended, non tender & soft. No organomegaly or masses appreciated. Normal bowel sounds heard. CNS:  somnolent but easily arousable and oriented to self and partly to place. Does not follow instructions. No focal neurological deficits.  Extremities: no edema, no cyanosis    The  results of significant diagnostics from this hospitalization (including imaging, microbiology, ancillary and laboratory) are listed below for reference.     Microbiology: Recent Results (from the past 240 hour(s))  Culture, blood (Routine X 2) w Reflex to ID Panel     Status: None   Collection Time: 02/03/16  9:45 PM  Result Value Ref Range Status   Specimen  Description BLOOD RIGHT ARM  Final   Special Requests IN PEDIATRIC BOTTLE 5ML  Final   Culture NO GROWTH 5 DAYS  Final   Report Status 02/08/2016 FINAL  Final  Culture, blood (Routine X 2) w Reflex to ID Panel     Status: None   Collection Time: 02/03/16 10:07 PM  Result Value Ref Range Status   Specimen Description BLOOD RIGHT WRIST  Final   Special Requests IN PEDIATRIC BOTTLE 1ML  Final   Culture NO GROWTH 5 DAYS  Final   Report Status 02/08/2016 FINAL  Final  CSF culture     Status: None   Collection Time: 02/04/16  2:04 PM  Result Value Ref Range Status   Specimen Description CSF  Final   Special Requests NONE  Final   Gram Stain   Final    WBC PRESENT, PREDOMINANTLY MONONUCLEAR NO ORGANISMS SEEN CYTOSPIN SMEAR    Culture NO GROWTH 3 DAYS  Final   Report Status 02/07/2016 FINAL  Final  Fungus Culture With Stain     Status: None (Preliminary result)   Collection Time: 02/04/16  2:04 PM  Result Value Ref Range Status   Fungus Stain Final report  Final    Comment: (NOTE) Performed At: [facility] [place], [place] [id number] Lindon Romp MD [id number]    Fungus (Mycology) Culture PENDING  Incomplete   Fungal Source CSF  Final  Fungus Culture Result     Status: None   Collection Time: 02/04/16  2:04 PM  Result Value Ref Range Status   Result 1 Comment  Final    Comment: (NOTE) KOH/Calcofluor preparation:  no fungus observed. Performed At: [facility] [place], [place] [id number] Lindon Romp MD [id number]   MRSA PCR Screening     Status: None   Collection Time: 02/04/16 10:26 PM  Result Value Ref Range Status   MRSA by PCR NEGATIVE NEGATIVE Final    Comment:        The GeneXpert MRSA Assay (FDA approved for NASAL specimens only), is one component of a comprehensive MRSA colonization surveillance program. It is not intended to diagnose MRSA infection nor to guide or monitor treatment for MRSA  infections.   C difficile quick scan w PCR reflex     Status: Abnormal   Collection Time: 02/05/16  2:37 PM  Result Value Ref Range Status   C Diff antigen POSITIVE (A) NEGATIVE Final   C Diff toxin POSITIVE (A) NEGATIVE Final   C Diff interpretation Toxin producing C. difficile detected.  Final    Comment: CRITICAL RESULT CALLED TO, READ BACK BY AND VERIFIED WITH: B ROCK,RN AT [id number] 02/05/16 BY L BENFIELD   Gastrointestinal Panel by PCR , Stool     Status: None   Collection Time: 02/05/16  4:47 PM  Result Value Ref Range Status   Campylobacter species NOT DETECTED NOT DETECTED Final   Plesimonas shigelloides NOT DETECTED NOT DETECTED Final   Salmonella species NOT DETECTED NOT DETECTED Final   Yersinia enterocolitica NOT DETECTED NOT DETECTED Final   Vibrio species NOT DETECTED  NOT DETECTED Final   Vibrio cholerae NOT DETECTED NOT DETECTED Final   Enteroaggregative E coli (EAEC) NOT DETECTED NOT DETECTED Final   Enteropathogenic E coli (EPEC) NOT DETECTED NOT DETECTED Final   Enterotoxigenic E coli (ETEC) NOT DETECTED NOT DETECTED Final   Shiga like toxin producing E coli (STEC) NOT DETECTED NOT DETECTED Final   Shigella/Enteroinvasive E coli (EIEC) NOT DETECTED NOT DETECTED Final   Cryptosporidium NOT DETECTED NOT DETECTED Final   Cyclospora cayetanensis NOT DETECTED NOT DETECTED Final   Entamoeba histolytica NOT DETECTED NOT DETECTED Final   Giardia lamblia NOT DETECTED NOT DETECTED Final   Adenovirus F40/41 NOT DETECTED NOT DETECTED Final   Astrovirus NOT DETECTED NOT DETECTED Final   Norovirus GI/GII NOT DETECTED NOT DETECTED Final   Rotavirus A NOT DETECTED NOT DETECTED Final   Sapovirus (I, II, IV, and V) NOT DETECTED NOT DETECTED Final  OVA + PARASITE EXAM     Status: None   Collection Time: 02/05/16  4:47 PM  Result Value Ref Range Status   OVA + PARASITE EXAM Final report  Final    Comment: (NOTE) These results were obtained using wet preparation(s) and  trichrome stained smear. This test does not include testing for Cryptosporidium parvum, Cyclospora, or Microsporidia. Performed At: [facility] [place], [place] [id number] Elwanda Brooklyn R MD [id number]    Source of Sample STOOL  Final  Acid Fast Smear (AFB)     Status: None   Collection Time: 02/05/16  4:47 PM  Result Value Ref Range Status   AFB Specimen Processing Concentration  Final   Acid Fast Smear Negative  Final    Comment: (NOTE) Performed At: [facility] [place], [place] [id number] Lindon Romp MD [id number]    Source (AFB) STOOL  Final     Labs: BNP (last 3 results) No results for input(s): BNP in the last 8760 hours. Basic Metabolic Panel:  Recent Labs Lab 02/04/16 0526 02/05/16 0253 02/06/16 0457 02/07/16 0630 02/08/16 0509  NA 138 138 131* 124* 128*  K 4.2 3.6 4.3 4.7 3.5  CL 102 99* 94* 89* 94*  CO2 25 28 26 22 25   GLUCOSE 63* 69 95 98 140*  BUN 27* 10 22* 29* 11  CREATININE 4.16* 2.50* 4.11* 5.09* 3.25*  CALCIUM 8.0* 8.6* 8.5* 8.6* 8.4*  MG  --  1.9  --   --   --   PHOS  --   --  7.8* 8.8* 5.5*   Liver Function Tests:  Recent Labs Lab 02/03/16 2214 02/04/16 0526 02/05/16 0253 02/06/16 0457 02/07/16 0630 02/08/16 0509  AST 50* 46* 65*  --   --   --   ALT 54 47 57*  --   --   --   ALKPHOS 277* 249* 250*  --   --   --   BILITOT 0.1* 0.4 0.6  --   --   --   PROT 8.4* 6.8 7.4  --   --   --   ALBUMIN 2.5* 1.9* 2.1* 1.9* 1.9* 1.7*   No results for input(s): LIPASE, AMYLASE in the last 168 hours.  Recent Labs Lab 02/04/16 0527  AMMONIA 28   CBC:  Recent Labs Lab 02/03/16 2214 02/04/16 0526 02/04/16 2212 02/05/16 0253 02/06/16 0457 02/07/16 0704 02/08/16 0509  WBC 6.0 3.8* 8.7 4.9 5.5 5.6 5.1  NEUTROABS 4.7 2.8  --  4.1  --   --   --  HGB 8.9* 7.9* 8.8* 9.7* 9.6* 9.7* 9.1*  HCT 28.2* 25.9* 27.8* 30.4* 29.7* 30.1* 28.8*  MCV 84.9 84.6 84.8 85.2 84.6 82.7 83.7  PLT 51*  47* 36* 29* 37* 45* 38*   CBG:  Recent Labs Lab 02/08/16 0815 02/08/16 1200 02/08/16 1636 02/08/16 1947 02/09/16 0507  GLUCAP 71 106* 106* 119* 135*   Urinalysis    Component Value Date/Time   COLORURINE YELLOW 02/06/2015 2326   APPEARANCEUR CLOUDY (A) 02/06/2015 2326   LABSPEC 1.017 02/06/2015 2326   PHURINE 7.5 02/06/2015 2326   GLUCOSEU 100 (A) 02/06/2015 2326   GLUCOSEU NEG mg/dL 04/18/2010 2036   HGBUR TRACE (A) 02/06/2015 2326   BILIRUBINUR NEGATIVE 02/06/2015 2326   KETONESUR NEGATIVE 02/06/2015 2326   PROTEINUR >300 (A) 02/06/2015 2326   UROBILINOGEN 2.0 (H) 06/24/2011 0307   NITRITE NEGATIVE 02/06/2015 2326   LEUKOCYTESUR TRACE (A) 02/06/2015 2326      Time coordinating discharge: Over 30 minutes  SIGNED:  Vernell Leep, MD, FACP, FHM. [facility] Pager [phone]  If 7PM-7AM, please contact night-coverage [email or web address] Password [email or web address] 02/09/2016, 12:55 PM

## 2016-02-09 NOTE — Progress Notes (Signed)
Reported called to [place] at [facility]

## 2016-02-09 NOTE — Discharge Instructions (Signed)
Confusion Introduction Confusion is the inability to think with your usual speed or clarity. Confusion may come on quickly or slowly over time. How quickly the confusion comes on depends on the cause. Confusion can be due to any number of causes. What are the causes?  Concussion, head injury, or head trauma.  Seizures.  Stroke.  Fever.  Brain tumor.  Age related decreased brain function (dementia).  Heightened emotional states like rage or terror.  Mental illness in which the person loses the ability to determine what is real and what is not (hallucinations).  Infections such as a urinary tract infection (UTI).  Toxic effects from alcohol, drugs, or prescription medicines.  Dehydration and an imbalance of salts in the body (electrolytes).  Lack of sleep.  Low blood sugar (diabetes).  Low levels of oxygen from conditions such as chronic lung disorders.  Drug interactions or other medicine side effects.  Nutritional deficiencies, especially niacin, thiamine, vitamin C, or vitamin B.  Sudden drop in body temperature (hypothermia).  Change in routine, such as when traveling or hospitalized. What are the signs or symptoms? People often describe their thinking as cloudy or unclear when they are confused. Confusion can also include feeling disoriented. That means you are unaware of where or who you are. You may also not know what the date or time is. If confused, you may also have difficulty paying attention, remembering, and making decisions. Some people also act aggressively when they are confused. How is this diagnosed? The medical evaluation of confusion may include:  Blood and urine tests.  X-rays.  Brain and nervous system tests.  Analyzing your brain waves (electroencephalogram or EEG).  Magnetic resonance imaging (MRI) of your head.  Computed tomography (CT) scan of your head.  Mental status tests in which your health care provider may ask many questions.  Some of these questions may seem silly or strange, but they are a very important test to help diagnose and treat confusion. How is this treated? An admission to the hospital may not be needed, but a person with confusion should not be left alone. Stay with a family member or friend until the confusion clears. Avoid alcohol, pain relievers, or sedative drugs until you have fully recovered. Do not drive until directed by your health care provider. Follow these instructions at home: What family and friends can do:  To find out if someone is confused, ask the person to state his or her name, age, and the date. If the person is unsure or answers incorrectly, he or she is confused.  Always introduce yourself, no matter how well the person knows you.  Often remind the person of his or her location.  Place a calendar and clock near the confused person.  Help the person with his or her medicines. You may want to use a pill box, an alarm as a reminder, or give the person each dose as prescribed.  Talk about current events and plans for the day.  Try to keep the environment calm, quiet, and peaceful.  Make sure the person keeps follow-up visits with his or her health care provider. How is this prevented? Ways to prevent confusion:  Avoid alcohol.  Eat a balanced diet.  Get enough sleep.  Take medicine only as directed by your health care provider.  Do not become isolated. Spend time with other people and make plans for your days.  Keep careful watch on your blood sugar levels if you are diabetic. Get help right away if:    You develop severe headaches, repeated vomiting, seizures, blackouts, or slurred speech.  There is increasing confusion, weakness, numbness, restlessness, or personality changes.  You develop a loss of balance, have marked dizziness, feel uncoordinated, or fall.  You have delusions, hallucinations, or develop severe anxiety.  Your family members think you need to be  rechecked. This information is not intended to replace advice given to you by your health care provider. Make sure you discuss any questions you have with your health care provider. Document Released: 02/24/2004 Document Revised: 08/06/2015 Document Reviewed: 02/21/2013  2017 Elsevier  

## 2016-02-09 NOTE — Clinical Social Work Note (Signed)
Pt is ready for discharge today and will go to [facility]. Facility is ready to admit pt as they have received discharge information. Pt's father is aware and agreeable to discharge plan. RN called ready report. PTAR will provide transportation. CSW is signing off as no further needs identified.   Darden Dates, MSW, LCSW  Clinical Social Worker  [phone]

## 2016-02-09 NOTE — Consult Note (Addendum)
[facility] Liaison  Received request from [place] for family interest in [place]. Chart reviewed and met with patient and family at bedside. All in agreement with transfer today. Patient's family [name] completed paper work at bedside. Dr. Orpah Melter to assume care. CSWs [place] and Judson Roch aware.   Please fax discharge summary to [phone].  RN please call report to [phone].  Thank you,  Erling Conte, LCSW [phone]  Hospice and [place] are listed on AMION under Hospice and [place].

## 2016-02-09 NOTE — Progress Notes (Signed)
Paula Becker to be D/C'd Hospice per MD order. Discussed with the patient and all questions fully answered.  Allergies as of 02/09/2016      Reactions   Shellfish-derived Products Swelling   Facial swelling      Medication List    STOP taking these medications   acyclovir ointment 5 % Commonly known as:  ZOVIRAX   albuterol 108 (90 Base) MCG/ACT inhaler Commonly known as:  PROVENTIL HFA;VENTOLIN HFA   alum & mag hydroxide-simeth 200-200-20 MG/5ML suspension Commonly known as:  MAALOX/MYLANTA   Darbepoetin Alfa 100 MCG/0.5ML Sosy injection Commonly known as:  ARANESP   dolutegravir 50 MG tablet Commonly known as:  TIVICAY   doxercalciferol 4 MCG/2ML injection Commonly known as:  HECTOROL   feeding supplement Liqd   gabapentin 300 MG capsule Commonly known as:  NEURONTIN   HYDROcodone-acetaminophen 5-325 MG tablet Commonly known as:  NORCO/VICODIN   hydrOXYzine 25 MG tablet Commonly known as:  ATARAX/VISTARIL   lopinavir-ritonavir 400-100 MG/5ML solution Commonly known as:  KALETRA   multivitamin Tabs tablet   valACYclovir 500 MG tablet Commonly known as:  VALTREX   VIREAD 300 MG tablet Generic drug:  tenofovir   zidovudine 100 MG capsule Commonly known as:  RETROVIR     TAKE these medications   fentaNYL 2,500 mcg in sodium chloride 0.9 % 200 mL Inject 20 mcg/hr into the vein continuous. 20 mcg/hr (2 mL/hr), Intravenous, at 2 mL/hr, Continuous   fentaNYL Soln Commonly known as:  SUBLIMAZE Inject 10 mcg into the vein every 15 (fifteen) minutes as needed (uncontrolled pain, distress or if respiratory rate is greater than 25).   glycopyrrolate 0.2 MG/ML injection Commonly known as:  ROBINUL Inject 2 mLs (0.4 mg total) into the vein 2 (two) times daily.   glycopyrrolate 1 MG tablet Commonly known as:  ROBINUL Take 1 tablet (1 mg total) by mouth every 4 (four) hours as needed (excessive secretions).   LORazepam 2 MG/ML concentrated solution Commonly  known as:  ATIVAN Place 0.5 mLs (1 mg total) under the tongue every 4 (four) hours as needed for anxiety.   vancomycin 50 mg/mL oral solution Commonly known as:  VANCOCIN Take 10 mLs (500 mg total) by mouth every 6 (six) hours. First dose on Sat 02/05/16 at 1800, For 14 days       VVS, Skin clean, dry and intact without evidence of skin break down, no evidence of skin tears noted.  IV catheter discontinued intact. Site without signs and symptoms of complications. Dressing and pressure applied.  An After Visit Summary was printed and given to PTAR Patient escorted via stretcher, and D/C hospice via EMS.  Fabian November  02/09/2016 3:00 PM

## 2016-02-09 NOTE — Clinical Social Work Note (Signed)
Clinical Social Work Assessment  Patient Details  Name: Paula Becker MRN: [id number] Date of Birth: Jan 10, 1976  Date of referral:  02/09/16               Reason for consult:  End of Life/Hospice                Permission sought to share information with:  Facility Sport and exercise psychologist, Family Supports Permission granted to share information::  No (Patient disoriented)  Name::     Paula Becker  Agency::  Hospice  Relationship::  Father  Contact Information:  [phone]  Housing/Transportation Living arrangements for the past 2 months:  Single Family Home Source of Information:  Parent Patient Interpreter Needed:  None Criminal Activity/Legal Involvement Pertinent to Current Situation/Hospitalization:  No - Comment as needed Significant Relationships:  Parents Lives with:  Self Do you feel safe going back to the place where you live?  No Need for family participation in patient care:  Yes (Comment)  Care giving concerns:  CSW received referral regarding end of life care. Patient lethargic. CSW spoke with patient's father. He is in agreement for patient to be transferred to a residential Hospice facility if patient is approved. His first choice is [facility] and second would be [facility] in [place]. CSW to continue to assist with discharge planning.    Social Worker assessment / plan:  CSW to make referral to residential Hospice facilities for assessment.   Employment status:  Other (Comment) Insurance information:  Medicare PT Recommendations:  Not assessed at this time Information / Referral to community resources:     Patient/Family's Response to care:  Patient's father expressed understanding of this process and appears to be at peace with the decision to transition patient to comfort care.  Patient/Family's Understanding of and Emotional Response to Diagnosis, Current Treatment, and Prognosis:  No questions or concerns reported at this time.   Emotional  Assessment Appearance:  Appears stated age Attitude/Demeanor/Rapport:  Unable to Assess Affect (typically observed):  Unable to Assess Orientation:  Oriented to Self, Oriented to Place Alcohol / Substance use:  Not Applicable Psych involvement (Current and /or in the community):  No (Comment)  Discharge Needs  Concerns to be addressed:  Care Coordination Readmission within the last 30 days:  No Current discharge risk:  None, Terminally ill Barriers to Discharge:  No Barriers Identified   Paula Becker, [place] 02/09/2016, 8:22 AM

## 2016-02-15 ENCOUNTER — Telehealth: Payer: Self-pay | Admitting: *Deleted

## 2016-02-15 NOTE — Telephone Encounter (Signed)
RN contacted [facility] to confirm that Paula Becker is still inpatient. RN spoke with Izora Gala who stated Ms. Otoole passed away on 2015/03/05 while at [facility]. Ms. Izora Gala stated Hansel Starling drifted off to sleep and passed away.

## 2016-03-02 DEATH — deceased

## 2016-03-03 LAB — FUNGUS CULTURE WITH STAIN

## 2016-03-03 LAB — FUNGAL ORGANISM REFLEX

## 2016-03-03 LAB — FUNGUS CULTURE RESULT

## 2016-03-21 LAB — ACID FAST CULTURE WITH REFLEXED SENSITIVITIES: ACID FAST CULTURE - AFSCU3: NEGATIVE

## 2016-03-21 LAB — ACID FAST CULTURE WITH REFLEXED SENSITIVITIES (MYCOBACTERIA)

## 2016-09-20 IMAGING — US US RENAL
1 series · 14 of 25 positions shown · non-contrast
Comparison: None.

CLINICAL DATA: Patient with acute renal failure.  History HIV.

EXAM:
RENAL / URINARY TRACT ULTRASOUND COMPLETE

[Series 1: us renal · 0.19mm/px · 14 of 28 slices shown]
[im 1/28]
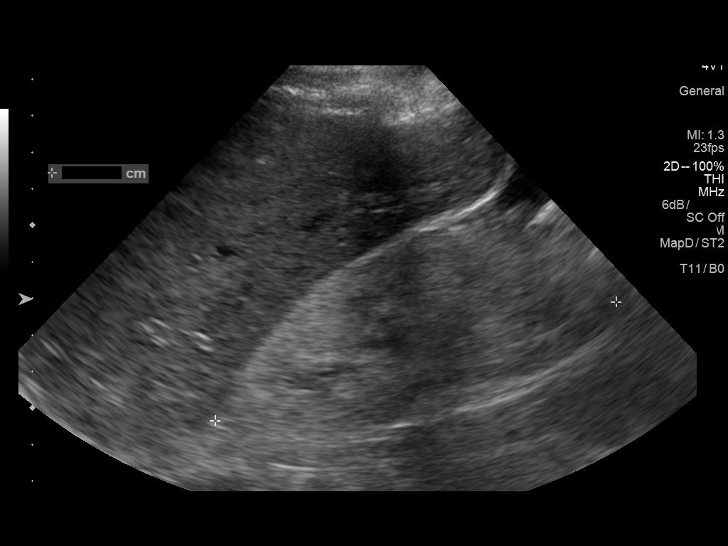
[im 3/28]
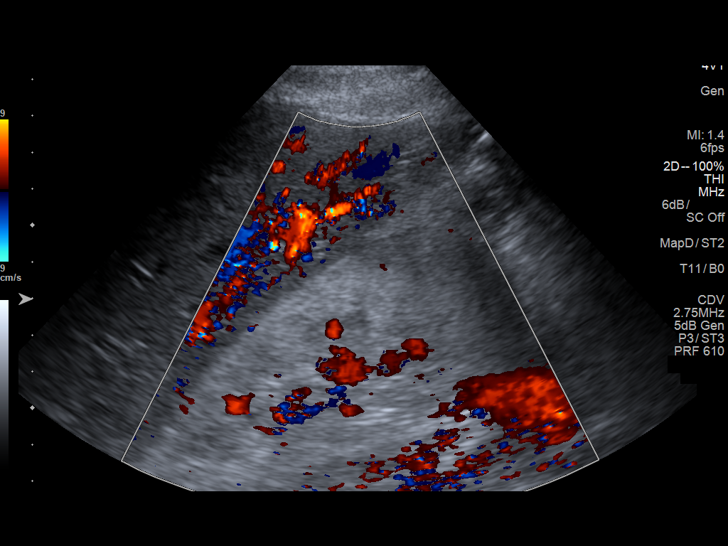
[im 5/28]
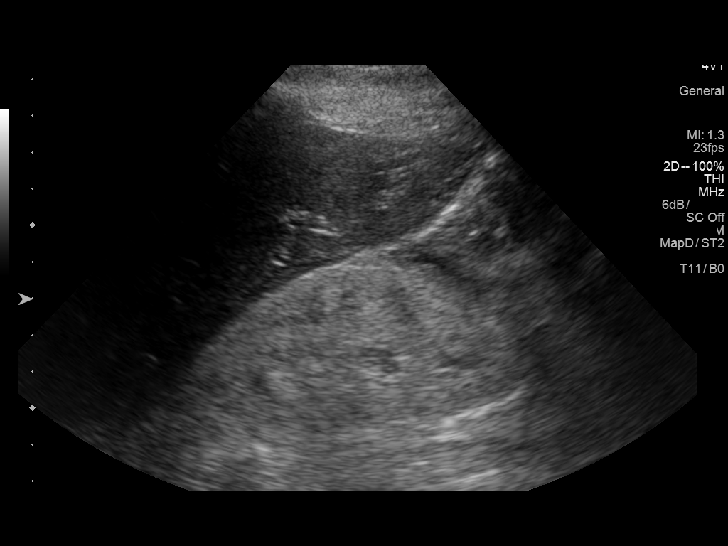
[im 7/28]
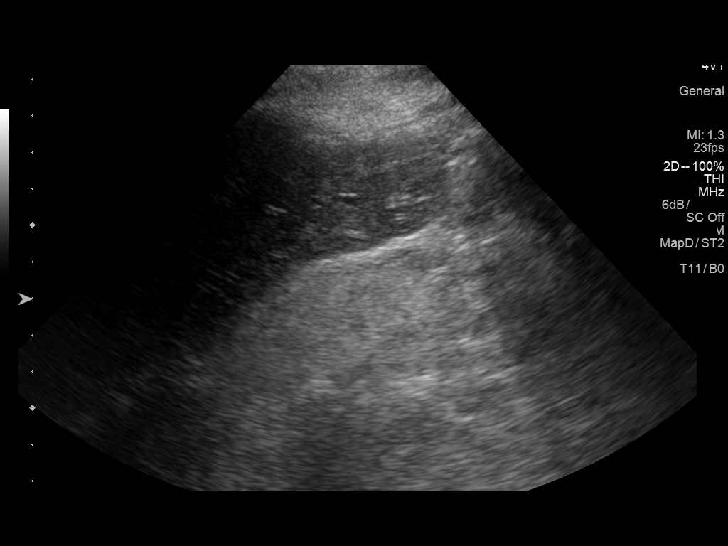
[im 10/28]
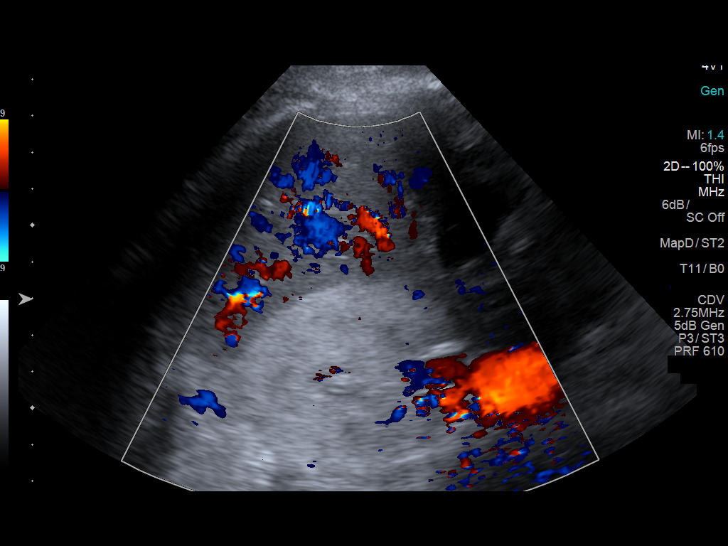
[im 11/28]
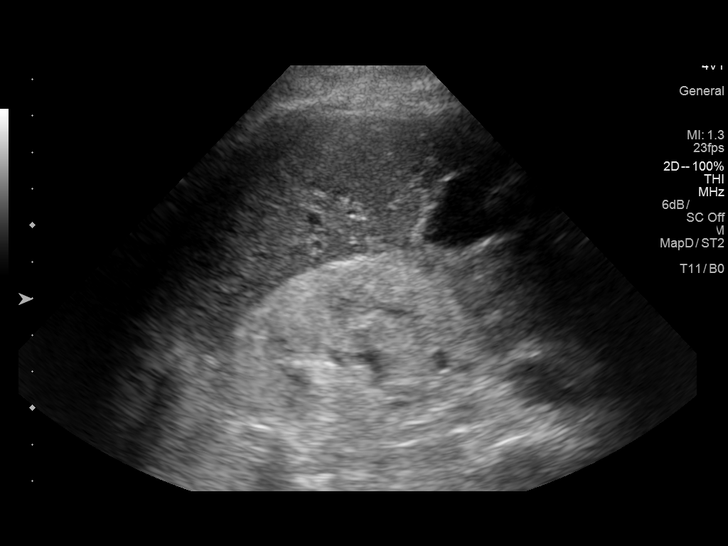
[im 13/28]
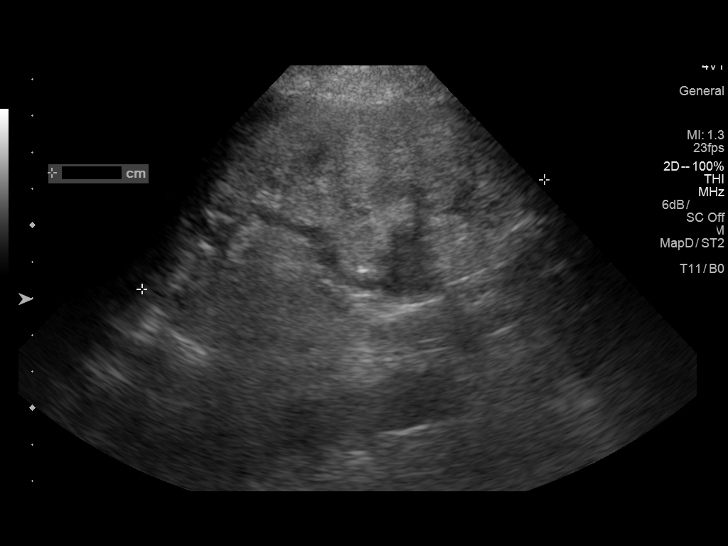
[im 15/28]
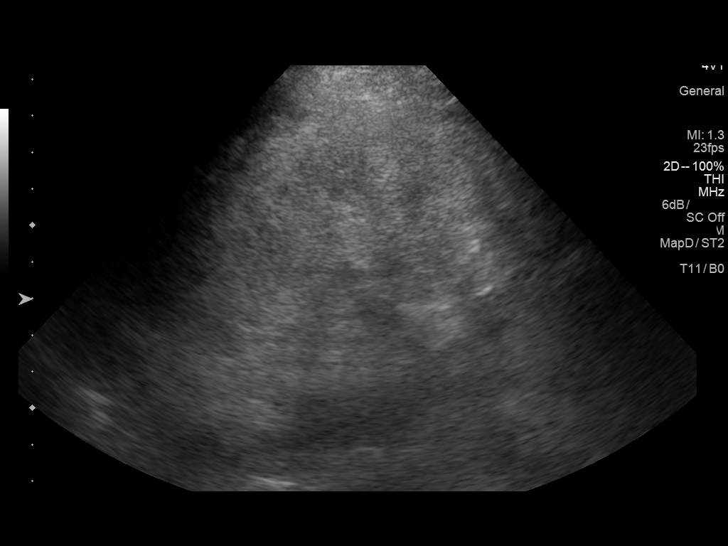
[im 17/28]
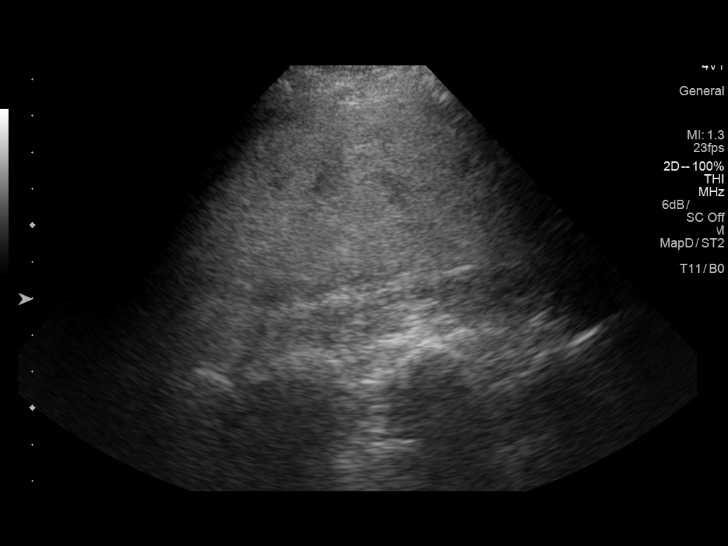
[im 19/28]
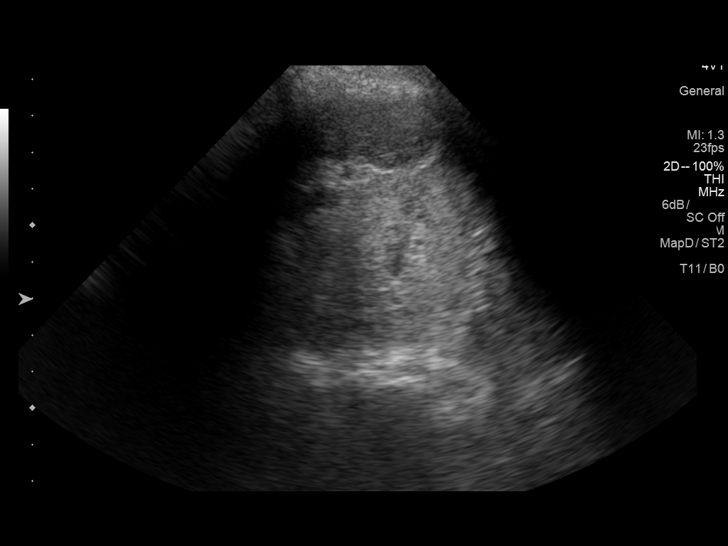
[im 21/28]
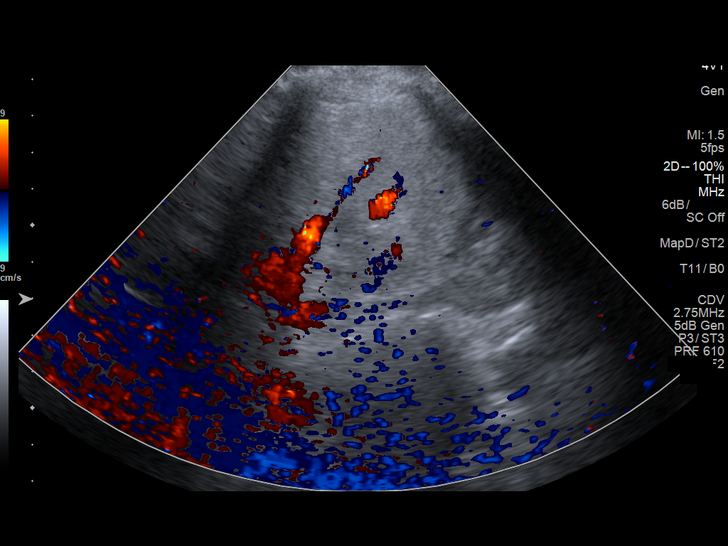
[im 23/28]
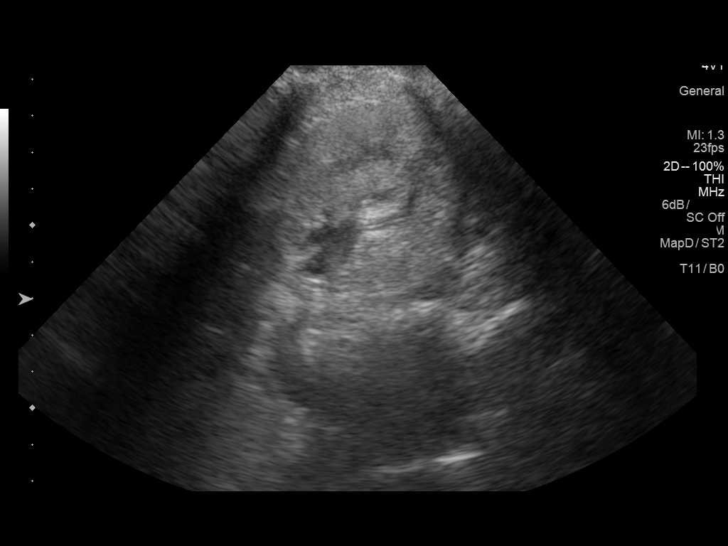
[im 25/28]
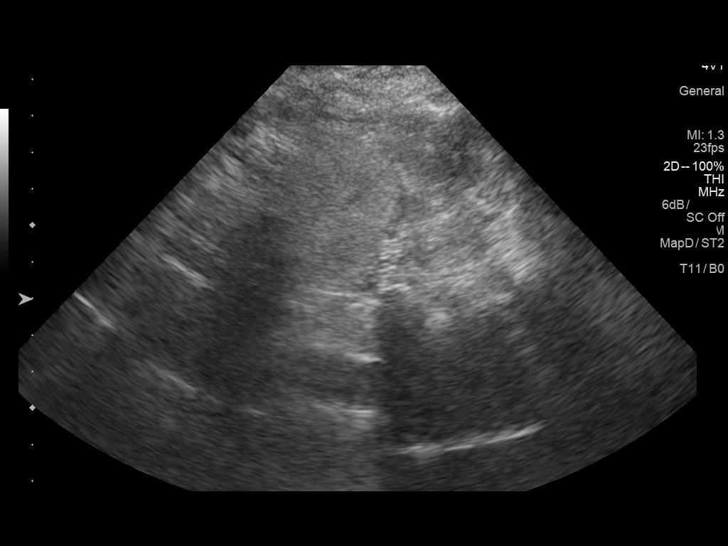
[im 28/28]
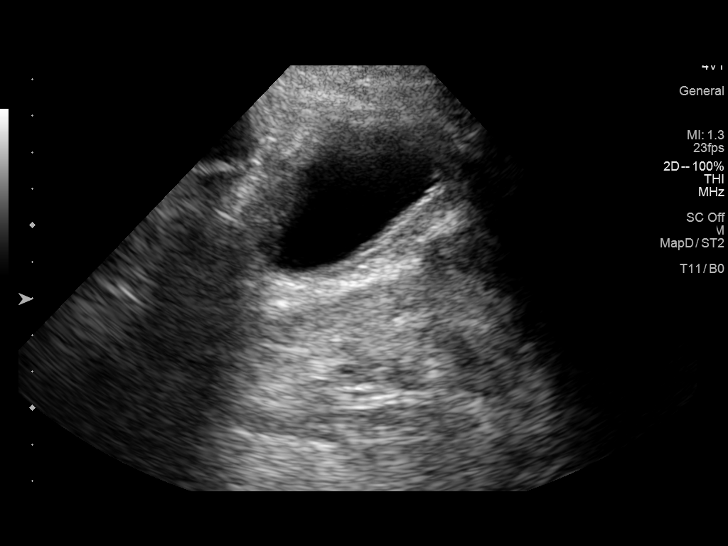

[14 of 25 positions shown; findings below may reference images not displayed]

FINDINGS: Right Kidney:

Length: 11.5 cm. Markedly increased renal cortical echogenicity. No
hydronephrosis.

Left Kidney:

Length: 11.4 cm. Markedly increased renal cortical echogenicity. No
hydronephrosis.

Bladder:

Appears normal for degree of bladder distention.
IMPRESSION: No hydronephrosis.

Markedly increased renal cortical echogenicity compatible with
medical renal disease.

## 2017-09-02 IMAGING — CR DG CHEST 2V
2 series · 2 of 2 positions shown · non-contrast
Comparison: 02/06/2015

CLINICAL DATA: Pneumocystis carinii pneumonia.  Fever.

EXAM:
CHEST  2 VIEW

[chest lat]
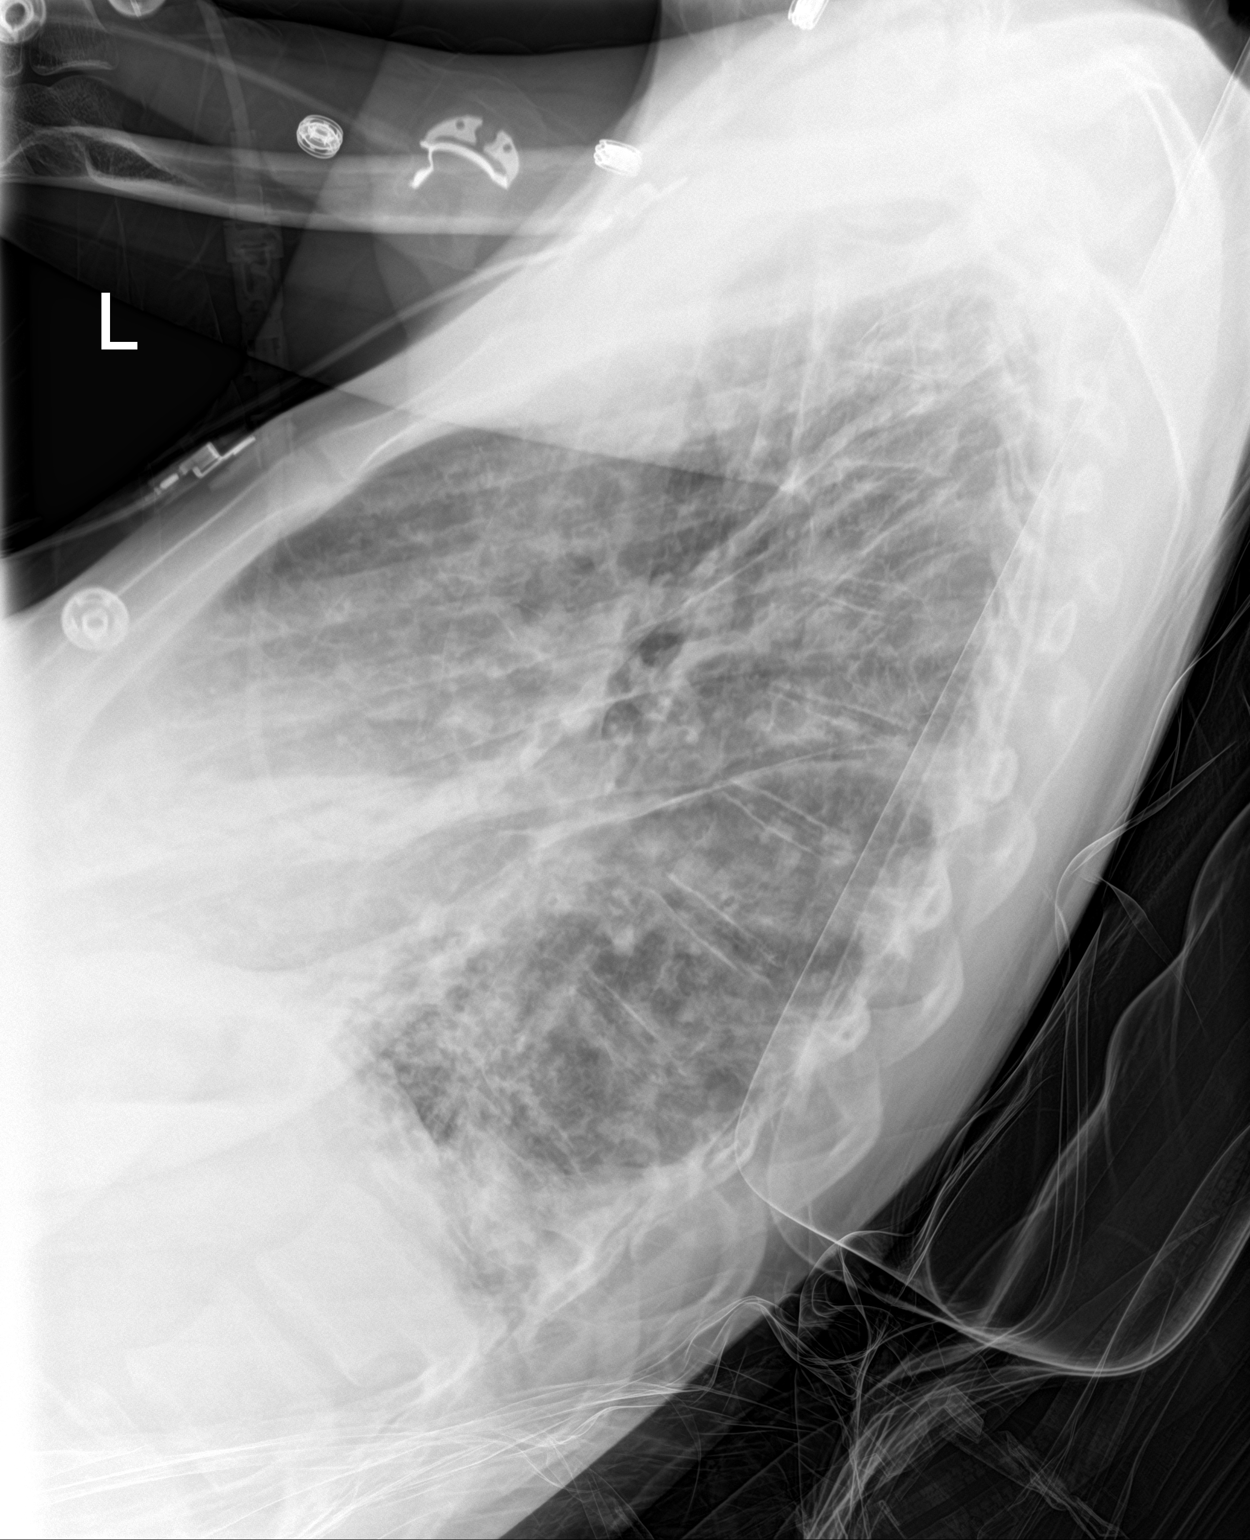

[chest ap]
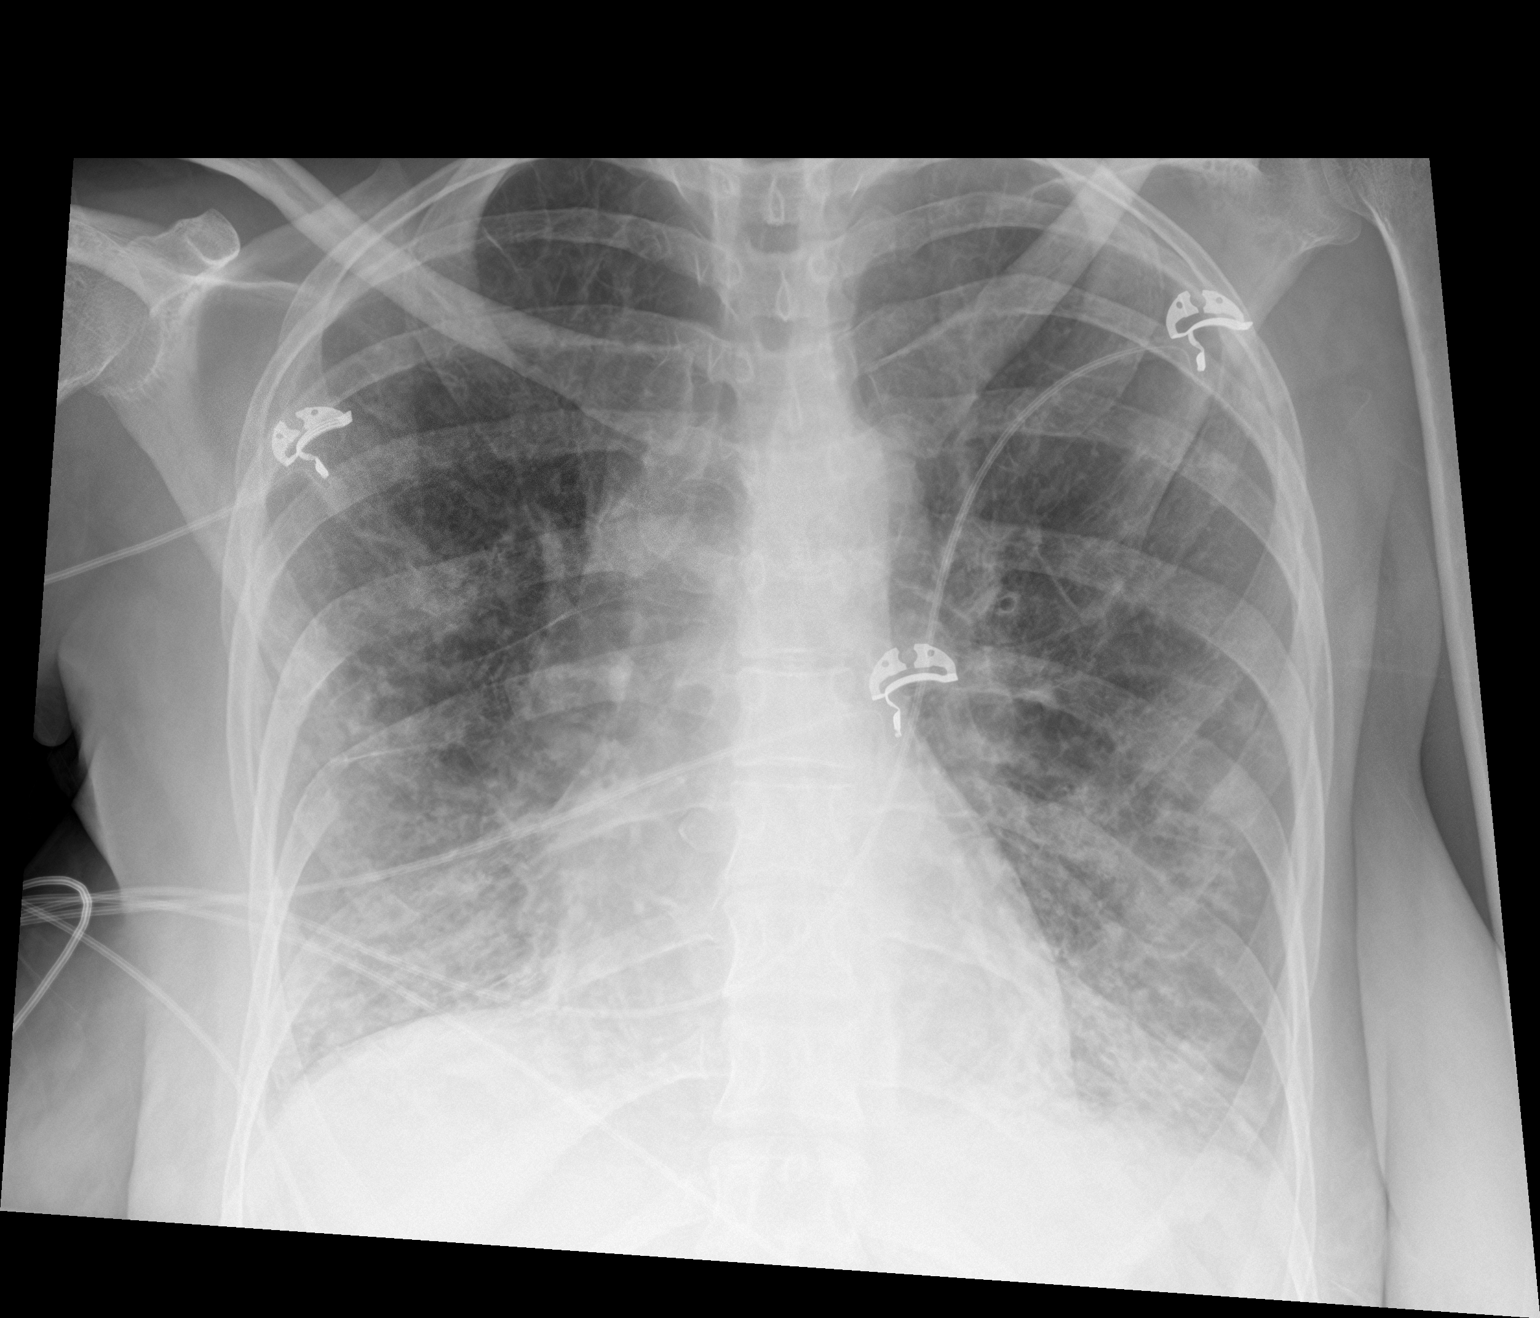

[2 of 2 positions shown; findings below may reference images not displayed]

FINDINGS: The cardiac silhouette is within normal limits in size for AP
technique. Ill-defined diffuse interstitial opacities are again seen
with more confluent opacities in both lung bases. Right basilar
opacities have increased from yesterday's study. Left basilar
opacities also appear minimally worse. No sizable pleural effusion
or pneumothorax is identified.
IMPRESSION: Increasing bibasilar opacities concerning for acute infection.

## 2017-09-05 IMAGING — DX DG ABDOMEN 2V
2 series · 2 of 2 positions shown · non-contrast
Comparison: None.

CLINICAL DATA: Abdominal pain.  Anemia of chronic disease.

EXAM:
ABDOMEN - 2 VIEW

[abdomen supine]
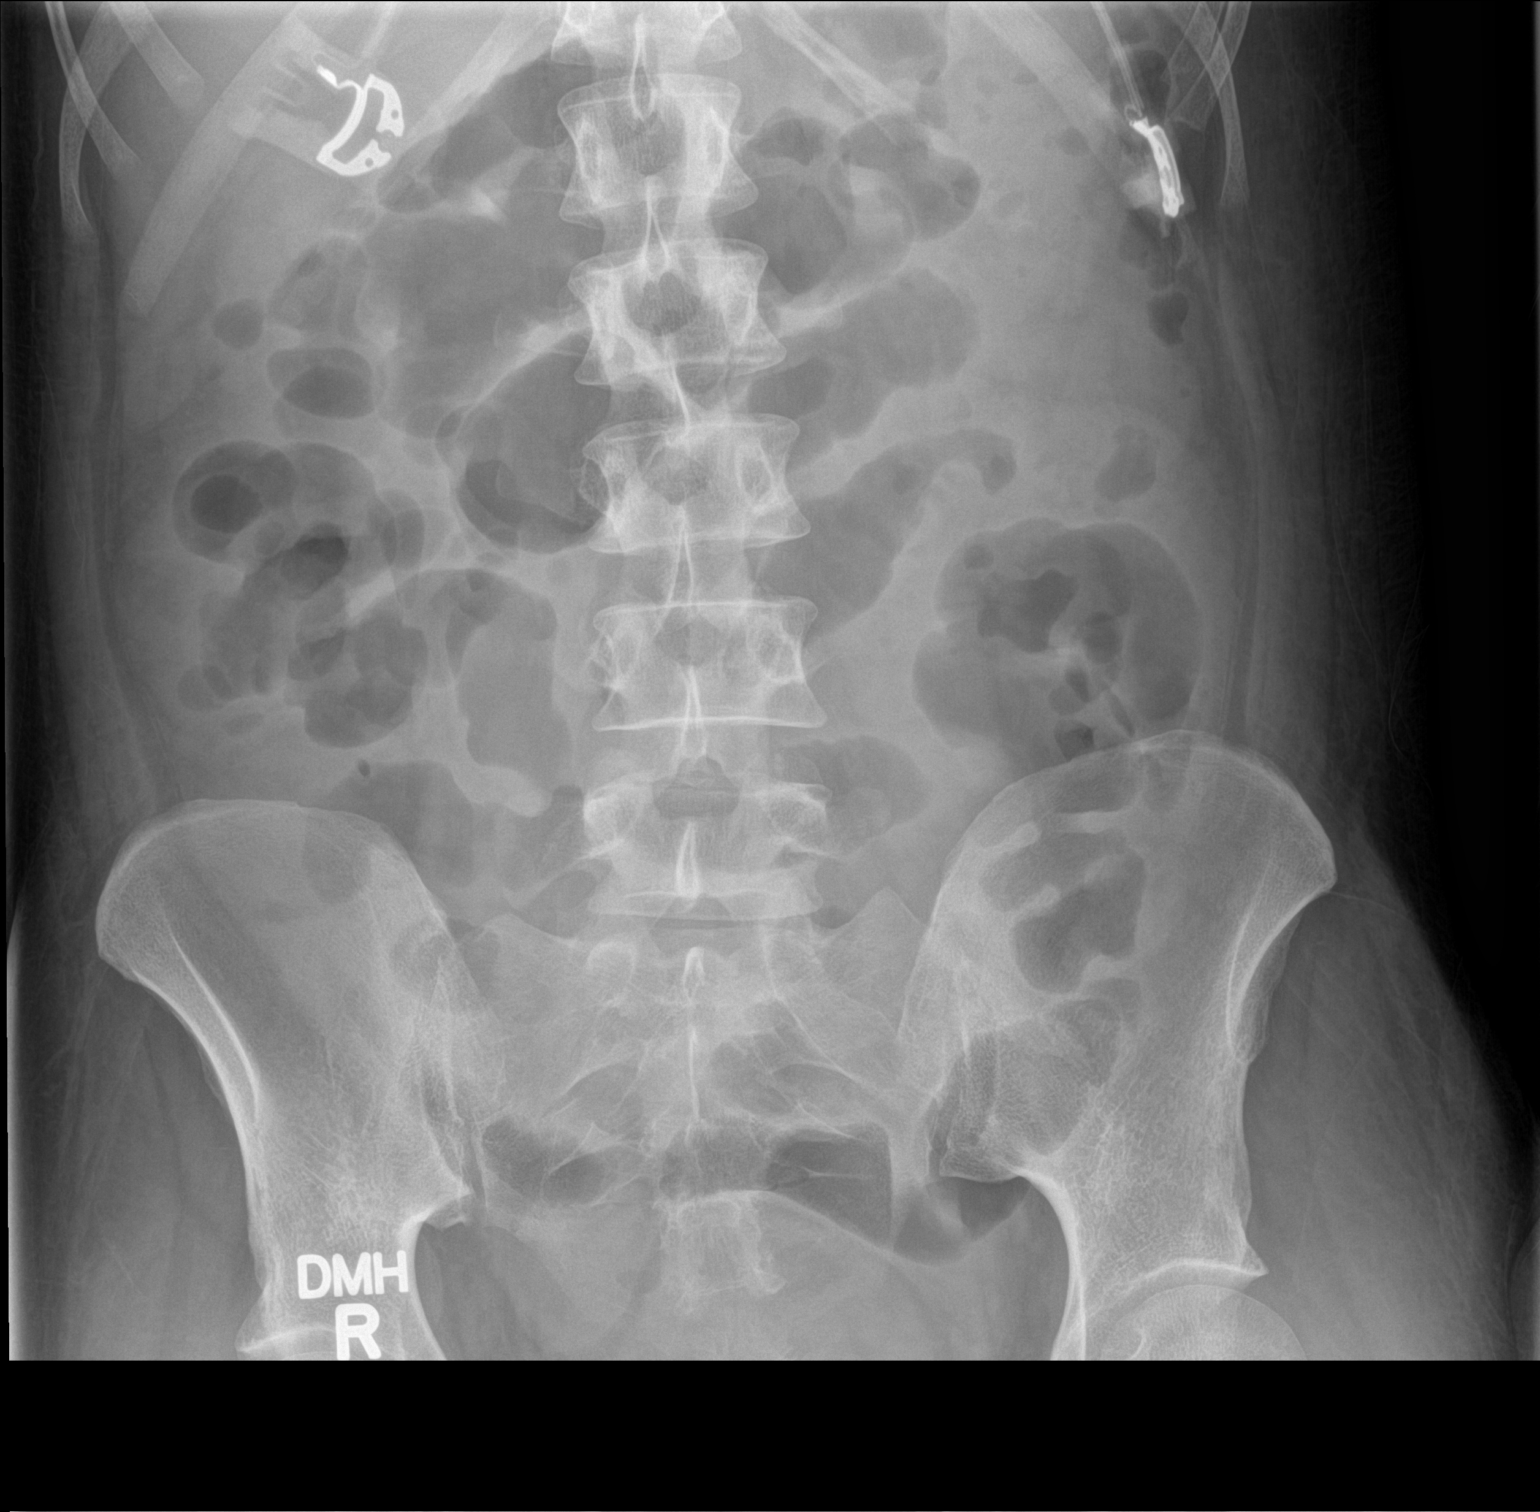

[abdomen decu]
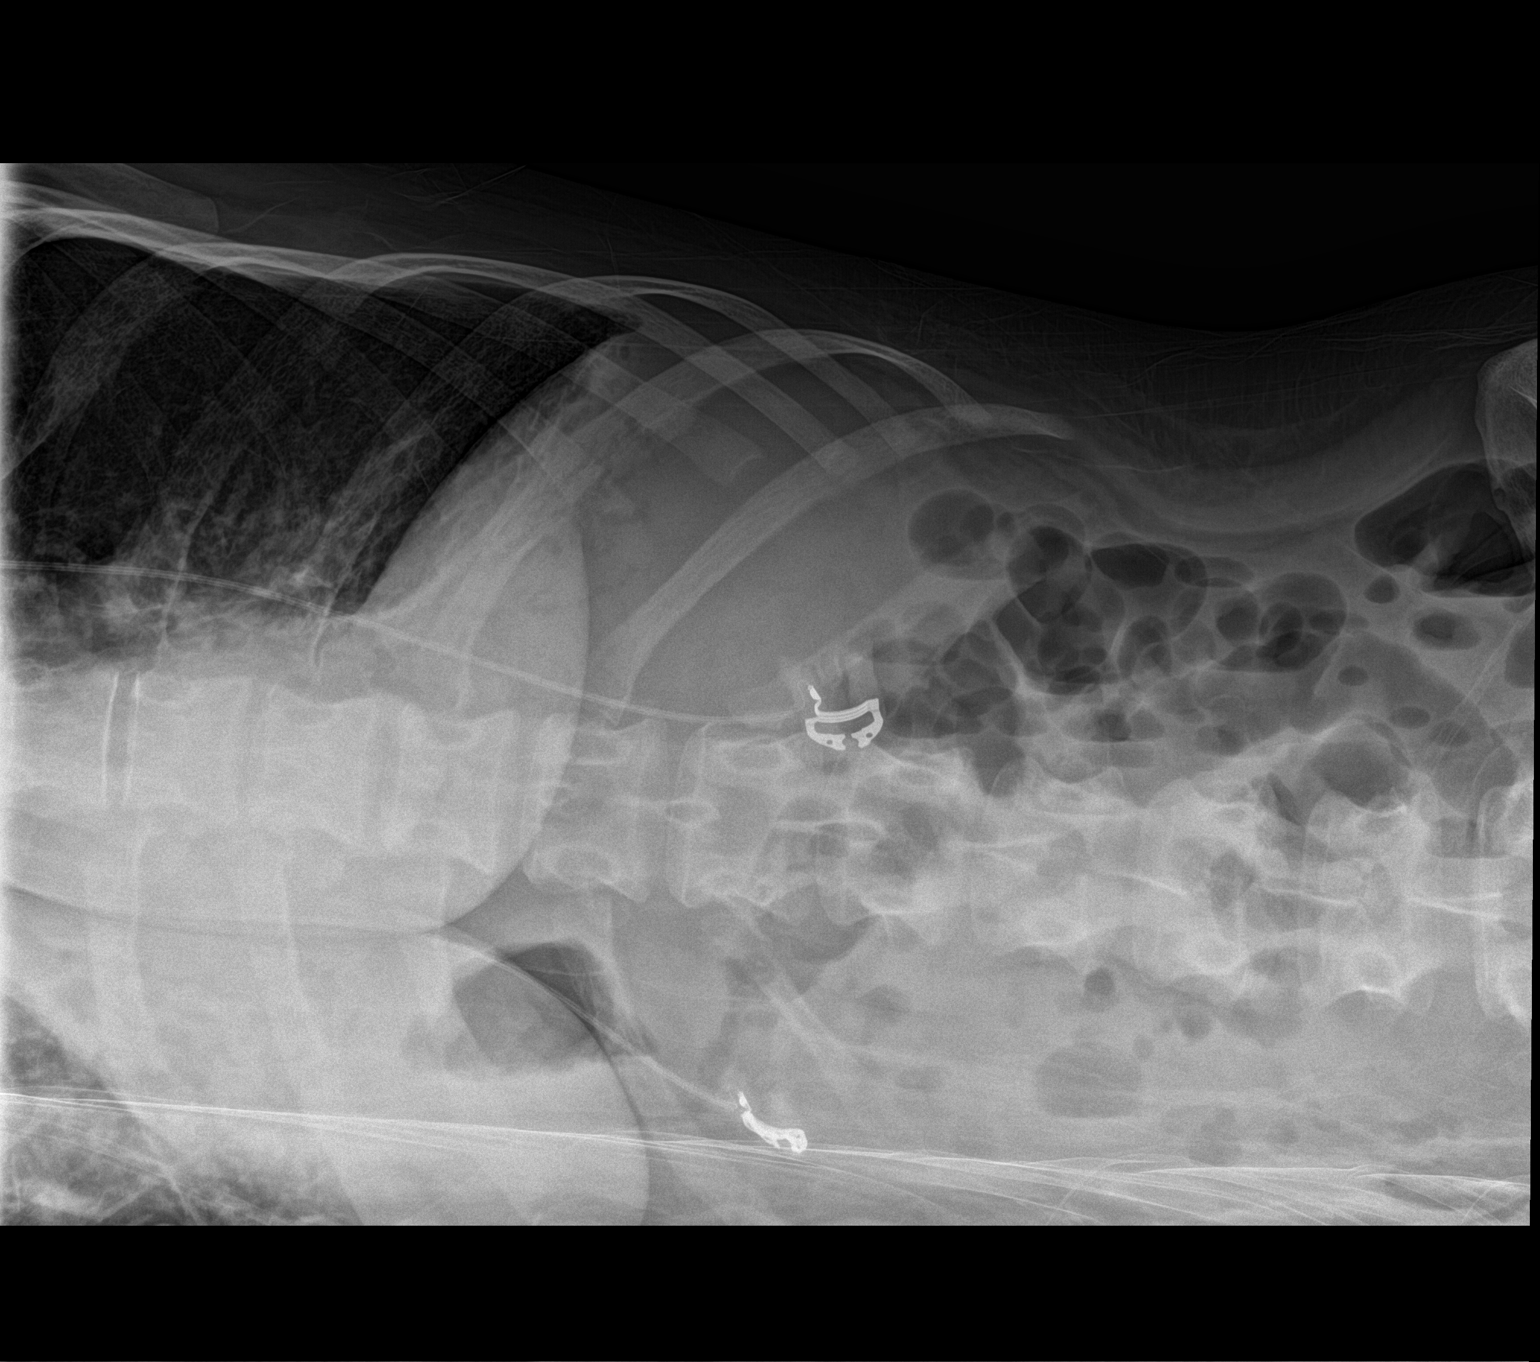

[2 of 2 positions shown; findings below may reference images not displayed]

FINDINGS: There is mild gaseous distension of the small bowel loops. There is
gas noted throughout the colon up to the rectum. No fluid levels
identified.
IMPRESSION: 1. No evidence for high-grade bowel obstruction.
2. Mild gaseous distension of the small bowel loops.
